# Patient Record
Sex: Female | Born: 1950 | Race: Black or African American | Hispanic: No | Smoking: Former smoker
Health system: Southern US, Community
[De-identification: ages and names within clinical notes are randomized; demographics above are authoritative.]

## PROBLEM LIST (undated history)

## (undated) DIAGNOSIS — D649 Anemia, unspecified: Secondary | ICD-10-CM

## (undated) DIAGNOSIS — F319 Bipolar disorder, unspecified: Secondary | ICD-10-CM

## (undated) DIAGNOSIS — G2581 Restless legs syndrome: Secondary | ICD-10-CM

## (undated) DIAGNOSIS — G610 Guillain-Barre syndrome: Secondary | ICD-10-CM

## (undated) DIAGNOSIS — E079 Disorder of thyroid, unspecified: Secondary | ICD-10-CM

## (undated) DIAGNOSIS — C801 Malignant (primary) neoplasm, unspecified: Secondary | ICD-10-CM

## (undated) DIAGNOSIS — M1990 Unspecified osteoarthritis, unspecified site: Secondary | ICD-10-CM

## (undated) DIAGNOSIS — Z8489 Family history of other specified conditions: Secondary | ICD-10-CM

## (undated) DIAGNOSIS — R0902 Hypoxemia: Secondary | ICD-10-CM

## (undated) DIAGNOSIS — J189 Pneumonia, unspecified organism: Secondary | ICD-10-CM

## (undated) DIAGNOSIS — J449 Chronic obstructive pulmonary disease, unspecified: Secondary | ICD-10-CM

## (undated) DIAGNOSIS — R569 Unspecified convulsions: Secondary | ICD-10-CM

## (undated) DIAGNOSIS — N63 Unspecified lump in unspecified breast: Secondary | ICD-10-CM

## (undated) DIAGNOSIS — F419 Anxiety disorder, unspecified: Secondary | ICD-10-CM

## (undated) DIAGNOSIS — K219 Gastro-esophageal reflux disease without esophagitis: Secondary | ICD-10-CM

## (undated) DIAGNOSIS — K589 Irritable bowel syndrome without diarrhea: Secondary | ICD-10-CM

## (undated) DIAGNOSIS — R51 Headache: Secondary | ICD-10-CM

## (undated) DIAGNOSIS — R519 Headache, unspecified: Secondary | ICD-10-CM

## (undated) HISTORY — PX: EYE SURGERY: SHX253

## (undated) HISTORY — PX: BREAST SURGERY: SHX581

## (undated) HISTORY — DX: Disorder of thyroid, unspecified: E07.9

## (undated) HISTORY — PX: COLONOSCOPY: SHX174

## (undated) HISTORY — PX: MANDIBLE RECONSTRUCTION: SHX431

## (undated) HISTORY — PX: TONSILLECTOMY: SUR1361

## (undated) HISTORY — DX: Hypoxemia: R09.02

---

## 2013-08-04 ENCOUNTER — Emergency Department (HOSPITAL_BASED_OUTPATIENT_CLINIC_OR_DEPARTMENT_OTHER)
Admission: EM | Admit: 2013-08-04 | Discharge: 2013-08-04 | Disposition: A | Discharge status: Home / Self Care | Payer: Medicaid Other | Attending: Emergency Medicine | Admitting: Emergency Medicine

## 2013-08-04 DIAGNOSIS — Z9889 Other specified postprocedural states: Secondary | ICD-10-CM | POA: Insufficient documentation

## 2013-08-04 DIAGNOSIS — K1379 Other lesions of oral mucosa: Secondary | ICD-10-CM

## 2013-08-04 DIAGNOSIS — Y849 Medical procedure, unspecified as the cause of abnormal reaction of the patient, or of later complication, without mention of misadventure at the time of the procedure: Secondary | ICD-10-CM | POA: Insufficient documentation

## 2013-08-04 DIAGNOSIS — T85698A Other mechanical complication of other specified internal prosthetic devices, implants and grafts, initial encounter: Secondary | ICD-10-CM | POA: Insufficient documentation

## 2013-08-04 DIAGNOSIS — Z79899 Other long term (current) drug therapy: Secondary | ICD-10-CM | POA: Insufficient documentation

## 2013-08-04 DIAGNOSIS — IMO0002 Reserved for concepts with insufficient information to code with codable children: Secondary | ICD-10-CM | POA: Insufficient documentation

## 2013-08-04 MED ORDER — KETOROLAC TROMETHAMINE 30 MG/ML IJ SOLN
60.0000 mg | Freq: Once | INTRAMUSCULAR | Status: AC
  Administered 2013-08-04: 60 mg via INTRAMUSCULAR
  Filled 2013-08-04: qty 2

## 2013-08-04 NOTE — ED Notes (Signed)
Pt has visible wire coming through gum in R lower mouth.
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
## 2013-08-04 NOTE — Discharge Instructions (Signed)
 Dental Care and Dentist Visits Dental care supports good overall health. Regular dental visits can also help you avoid dental pain, bleeding, infection, and other more serious health problems in the future. It is important to keep the mouth healthy because diseases in the teeth, gums, and other oral tissues can spread to other areas of the body. Some problems, such as diabetes, heart disease, and pre-term labor have been associated with poor oral health.  See your dentist every 6 months. If you experience emergency problems such as a toothache or broken tooth, go to the dentist right away. If you see your dentist regularly, you may catch problems early. It is easier to be treated for problems in the early stages.  WHAT TO EXPECT AT A DENTIST VISIT  Your dentist will look for many common oral health problems and recommend proper treatment. At your regular dental visit, you can expect:  Gentle cleaning of the teeth and gums. This includes scraping and polishing. This helps to remove the sticky substance around the teeth and gums (plaque). Plaque forms in the mouth shortly after eating. Over time, plaque hardens on the teeth as tartar. If tartar is not removed regularly, it can cause problems. Cleaning also helps remove stains.  Periodic X-rays. These pictures of the teeth and supporting bone will help your dentist assess the health of your teeth.  Periodic fluoride treatments. Fluoride is a natural mineral shown to help strengthen teeth. Fluoride treatmentinvolves applying a fluoride gel or varnish to the teeth. It is most commonly done in children.  Examination of the mouth, tongue, jaws, teeth, and gums to look for any oral health problems, such as:  Cavities (dental caries). This is decay on the tooth caused by plaque, sugar, and acid in the mouth. It is best to catch a cavity when it is small.  Inflammation of the gums caused by plaque buildup (gingivitis).  Problems with the mouth or malformed  or misaligned teeth.  Oral cancer or other diseases of the soft tissues or jaws. KEEP YOUR TEETH AND GUMS HEALTHY For healthy teeth and gums, follow these general guidelines as well as your dentist's specific advice:  Have your teeth professionally cleaned at the dentist every 6 months.  Brush twice daily with a fluoride toothpaste.  Floss your teeth daily.  Ask your dentist if you need fluoride supplements, treatments, or fluoride toothpaste.  Eat a healthy diet. Reduce foods and drinks with added sugar.  Avoid smoking. TREATMENT FOR ORAL HEALTH PROBLEMS If you have oral health problems, treatment varies depending on the conditions present in your teeth and gums.  Your caregiver will most likely recommend good oral hygiene at each visit.  For cavities, gingivitis, or other oral health disease, your caregiver will perform a procedure to treat the problem. This is typically done at a separate appointment. Sometimes your caregiver will refer you to another dental specialist for specific tooth problems or for surgery. SEEK IMMEDIATE DENTAL CARE IF:  You have pain, bleeding, or soreness in the gum, tooth, jaw, or mouth area.  A permanent tooth becomes loose or separated from the gum socket.  You experience a blow or injury to the mouth or jaw area. Document Released: 08/02/2011 Document Revised: 02/12/2012 Document Reviewed: 08/02/2011 [facility] Patient Information 2014 [place], [place].   RESOURCE GUIDE  Chronic Pain Problems: Contact [facility]  [phone] Patients need to be referred by their primary care doctor.  Insufficient Money for Medicine: Contact United Way:  call [phone]  No Primary Care Doctor: - Call Health Connect  [phone] - can help you locate a primary care doctor that  accepts your insurance, provides certain services, etc. - Physician Referral Service- [phone]  Agencies that provide inexpensive medical care: - [facility]  [phone] - [facility]  [phone] - [facility]  [phone] - Women's Clinic  [phone] - Planned Parenthood  [phone] [facility]  [phone]  Medicaid-accepting [facility] Providers: - [facility]- [address], [address]  [phone], Mon-Fri 9am-7pm, Sat 9am-1pm - [facility]- [address] [place], Suite [place]  [phone] - [facility]- [address], Suite [place]  [phone] [facility] Family Medicine- [address]  [phone] - Jonathon Neighbors- [address] [place], [address], [phone]  Only accepts [place] patients after they have their name  applied to their card  Self Pay (no insurance) in [facility]: - Sickle Cell Patients - [facility]  [address] [place], [phone] - [facility]- [address] [place]  [phone]       [facility] [place]- [address]       -     [facility]- see information above (Speak to [employer] if you do not have insurance)       -  [facility]- [address] [place],  [phone]       -  [facility]- [address], [phone]       -  Dr Sherlene Diss-  [address], [place], [phone]       -  [facility] - [address], [phone]       -  [facility]- [address], [phone], also [address] [phone]       -     [facility]- [address] [place], [phone], 1st & 3rd Saturday         every month, 10am-1pm  -     [facility] and [facility]   [address] [place], [place].   Phone:  [phone], Fax:  [phone]. Hours of Operation:  9 am - 6 pm, M-F.  -     [facility]   [address], [place]   Phone: [phone], Fax: [phone]. Hours of Operation:  8:30 am - 5:30 pm, M-F.  [facility] [address] [place], [place] [address] [phone]  [facility] [address] [place], [place] [address] [phone]  1) Find a Doctor and Pay Out of Pocket Although you won't have to find out who is covered by your insurance plan, it is a good idea to ask around and get recommendations. You will then need to call the office and see if the doctor you have chosen will accept you as a new patient and what types of options they offer for patients who are self-pay. Some doctors offer discounts or will set up payment plans for their patients who do not have insurance, but you will need to ask so you aren't surprised when you get to your appointment.  2) Contact Your Local Health Department Not all health departments have doctors that can see patients for sick visits, but  many do, so it is worth a call to see if yours does. If you don't know where your local health department is, you can check in your phone book. The CDC also has a tool to help you locate your state's health department, and many state websites also have listings of all of their local health departments.  3) Find a Walk-in Clinic If your illness is not likely to be very severe or complicated, you may want to try a walk in clinic. These are popping up all over the country in pharmacies, drugstores, and shopping centers. They're usually staffed by nurse practitioners or physician assistants that have been trained to treat common illnesses and complaints. They're usually fairly quick and inexpensive. However, if you have serious medical issues or chronic medical problems, these are probably not your best option  STD Testing - [facility] Department of [facility], STD Clinic, [address], [place], phone [phone] or [phone].  Monday - Friday, call for an appointment. [facility] of [employer], STD Clinic, [address], [place], phone  [phone] or [phone].  Monday - Friday, call for an appointment.  Abuse/Neglect: [facility] Child Abuse Hotline 469-880-6461 [facility] Child Abuse Hotline 915-473-7357 (After Hours)  Emergency Shelter:  [facility] ([phone]  Maternity Homes: - Room at the [place] [facility] 3143840653 - [facility] [phone]  MRSA Hotline #:   [phone]  Dental Assistance If unable to pay or uninsured, contact:  [facility]. to become qualified for the adult dental clinic.  Patients with Medicaid: [facility] [phone] [address], [phone] [address], [phone]  If unable to pay, or uninsured, contact [facility] [phone] in [place], [phone] in [facility]) to become qualified for the adult dental clinic  [facility] [address] [place], [place] [address] [phone] [email or web address]  Other Proofreader Services: - [facility]- [address] [place], [place], [place], [address], [phone], Ext. [phone], 2nd and 4th Thursday of the month at 6:30am.  10 clients each day by appointment, can sometimes see walk-in patients if someone does not show for an appointment. [facility]- [address] [place], [place], [address], [phone] - [facility] [address] [place], [place], [address], [phone] - [facility]- [phone] [facility]- [phone] [facility] [phone]       Behavioral Health Resources in the [facility]  Intensive Outpatient Programs: [facility]      [address] [place], [place] [phone] Both a day and evening program       [facility]     [address]        [place], [place] [address] [phone]         ADS: [facility] [address] [place] [phone]  [facility] ACCESS LINE: (404)795-6890 or [phone] [place], [place] [address] [email or web address]   Substance Abuse Resources: - Alcohol and Drug Services  7083165285 - [facility] [phone] - The [place] [phone] Kenny Peals [phone] - Residential & Outpatient Substance Abuse Program  [phone]  Psychological Services: [facility]  [phone] [facility] Services  [phone] - [facility], [phone] [place]. [address], [place], ACCESS LINE: [phone]  or (404) [phone], [email or web address]  Mobile Crisis Teams:                                        Therapeutic Alternatives         Mobile Crisis Care Unit [phone]             [facility] [address] Jonette Nestle [phone]                                         Interventionist [address] [place] [phone]  Self-Help/Support Groups: Mental Health Assoc. of [employer] of support groups (236)[phone] (call for more info)  Narcotics Anonymous (NA) Caring Services [address] [place] - 2 meetings at this location  Residential Treatment Programs:  [facility]      [address]        [place]       [phone]         [facility] [address] [place] [address] [place], [place]  [address] [phone]  [facility]  [address] [place], [place] [address] [phone] Admissions: 8am-3pm M-F  [facility]     [address]        [place], [place] [address]       [phone]         [facility] [address] Elda Greener [place], [place] [phone]  [facility] [address] [place], [place] [phone]  [facility] -  Intensive Outpatient      [address]     [place], [place]       [phone]         [facility] (Addiction [facility]     [address] [place], [place] [phone] or [phone]  Residential Treatment Services (RTS), Medicaid [address] [place], [place] [phone]  [facility] [address]                                               [address] [place] [phone]  [facility] Resources: [phone]               General Therapy                                                Trixie Furnace, PhD        [address] [place], [place] [address]         [phone]   Insurance  [facility] Behavioral   [address] [place], [place] [address] [phone]  [facility] Recovery [address] [place], [place] [address] [phone] Insurance/Medicaid/sponsorship through [facility]  Faith and Families  [address]                                        [place] [employer], [place] [address]    Therapy/tele-psych/case         [phone]          [facility] [address], [place]  [address]  Adolescent/group home/case management [phone]                                           Artist Binet PhD       General therapy       Insurance   [phone]         Dr. Carlos [place], [place], M-F [phone]  [facility] [place]  [employer] [facility] Dept. [address]                 [address]         [address] [place] [address]  Clara Crisp Phone:  [phone]                                  Phone:  [phone]                   Phone:  [phone]  [facility] Mental Health, [phone] - [facility] - [facility]- [phone]       -     [facility] in [place], [address],             [phone], Insurance  [place] Child Abuse Hotline (712)549-5606 or [phone] (After Hours)
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
## 2014-06-08 ENCOUNTER — Emergency Department (HOSPITAL_BASED_OUTPATIENT_CLINIC_OR_DEPARTMENT_OTHER)
Admission: EM | Admit: 2014-06-08 | Discharge: 2014-06-08 | Discharge status: Against Medical Advice | Payer: Medicaid Other | Attending: Emergency Medicine | Admitting: Emergency Medicine

## 2014-06-08 ENCOUNTER — Encounter (HOSPITAL_BASED_OUTPATIENT_CLINIC_OR_DEPARTMENT_OTHER): Payer: Self-pay | Admitting: Emergency Medicine

## 2014-06-08 DIAGNOSIS — X088XXA Exposure to other specified smoke, fire and flames, initial encounter: Secondary | ICD-10-CM | POA: Diagnosis not present

## 2014-06-08 DIAGNOSIS — T2200XA Burn of unspecified degree of shoulder and upper limb, except wrist and hand, unspecified site, initial encounter: Secondary | ICD-10-CM | POA: Insufficient documentation

## 2014-06-08 DIAGNOSIS — Y929 Unspecified place or not applicable: Secondary | ICD-10-CM | POA: Insufficient documentation

## 2014-06-08 DIAGNOSIS — Y939 Activity, unspecified: Secondary | ICD-10-CM | POA: Insufficient documentation

## 2014-06-08 HISTORY — DX: Malignant (primary) neoplasm, unspecified: C80.1

## 2014-06-08 HISTORY — DX: Unspecified lump in unspecified breast: N63.0
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
## 2014-06-08 NOTE — ED Notes (Signed)
Called to room no answer.  Per [place], rn pt left b/c did not want to wait.
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
## 2015-10-14 ENCOUNTER — Encounter (HOSPITAL_COMMUNITY): Payer: Self-pay | Admitting: *Deleted

## 2015-10-14 MED ORDER — CEFAZOLIN SODIUM-DEXTROSE 2-3 GM-% IV SOLR
2.0000 g | INTRAVENOUS | Status: AC
  Administered 2015-10-15: 2 g via INTRAVENOUS
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
## 2015-10-14 NOTE — Anesthesia Preprocedure Evaluation (Addendum)
Anesthesia Evaluation  Patient identified by MRN, date of birth, ID band Patient awake    Reviewed: Allergy & Precautions, NPO status , Patient's Chart, lab work & pertinent test results  History of Anesthesia Complications Negative for: history of anesthetic complications  Airway Mallampati: I  TM Distance: >3 FB Neck ROM: Full    Dental  (+) Edentulous Upper, Dental Advisory Given   Pulmonary COPD, former smoker (quit '08),    breath sounds clear to auscultation       Cardiovascular (-) anginanegative cardio ROS   Rhythm:Regular Rate:Normal     Neuro/Psych Seizures -,  Anxiety Depression Bipolar Disorder H/o Guillian Barre    GI/Hepatic Neg liver ROS, GERD  Controlled,  Endo/Other  negative endocrine ROS  Renal/GU negative Renal ROS     Musculoskeletal  (+) Arthritis , Osteoarthritis,    Abdominal   Peds  Hematology negative hematology ROS (+)   Anesthesia Other Findings   Reproductive/Obstetrics                          Anesthesia Physical Anesthesia Plan  ASA: III  Anesthesia Plan: General   Post-op Pain Management:    Induction: Intravenous  Airway Management Planned: Nasal ETT  Additional Equipment:   Intra-op Plan:   Post-operative Plan: Extubation in OR  Informed Consent: I have reviewed the patients History and Physical, chart, labs and discussed the procedure including the risks, benefits and alternatives for the proposed anesthesia with the patient or authorized representative who has indicated his/her understanding and acceptance.   Dental advisory given  Plan Discussed with: Surgeon and CRNA  Anesthesia Plan Comments: (Plan routine monitors, GETA)        Anesthesia Quick Evaluation

## 2015-10-15 ENCOUNTER — Encounter (HOSPITAL_COMMUNITY): Payer: Self-pay | Admitting: *Deleted

## 2015-10-15 ENCOUNTER — Ambulatory Visit (HOSPITAL_COMMUNITY): Payer: Medicaid Other | Admitting: Anesthesiology

## 2015-10-15 ENCOUNTER — Encounter (HOSPITAL_COMMUNITY)
Admission: RE | Disposition: A | Discharge status: Home / Self Care | Payer: Self-pay | Source: Ambulatory Visit | Attending: Oral Surgery

## 2015-10-15 ENCOUNTER — Ambulatory Visit (HOSPITAL_COMMUNITY)
Admission: RE | Admit: 2015-10-15 | Discharge: 2015-10-15 | Disposition: A | Discharge status: Home / Self Care | Payer: Medicaid Other | Source: Ambulatory Visit | Attending: Oral Surgery | Admitting: Oral Surgery

## 2015-10-15 DIAGNOSIS — F319 Bipolar disorder, unspecified: Secondary | ICD-10-CM | POA: Insufficient documentation

## 2015-10-15 DIAGNOSIS — Y831 Surgical operation with implant of artificial internal device as the cause of abnormal reaction of the patient, or of later complication, without mention of misadventure at the time of the procedure: Secondary | ICD-10-CM | POA: Insufficient documentation

## 2015-10-15 DIAGNOSIS — R569 Unspecified convulsions: Secondary | ICD-10-CM | POA: Insufficient documentation

## 2015-10-15 DIAGNOSIS — J449 Chronic obstructive pulmonary disease, unspecified: Secondary | ICD-10-CM | POA: Diagnosis not present

## 2015-10-15 DIAGNOSIS — F418 Other specified anxiety disorders: Secondary | ICD-10-CM | POA: Diagnosis not present

## 2015-10-15 DIAGNOSIS — Z7983 Long term (current) use of bisphosphonates: Secondary | ICD-10-CM | POA: Diagnosis not present

## 2015-10-15 DIAGNOSIS — K219 Gastro-esophageal reflux disease without esophagitis: Secondary | ICD-10-CM | POA: Diagnosis not present

## 2015-10-15 DIAGNOSIS — M1990 Unspecified osteoarthritis, unspecified site: Secondary | ICD-10-CM | POA: Insufficient documentation

## 2015-10-15 DIAGNOSIS — T8484XA Pain due to internal orthopedic prosthetic devices, implants and grafts, initial encounter: Secondary | ICD-10-CM | POA: Insufficient documentation

## 2015-10-15 DIAGNOSIS — Z87891 Personal history of nicotine dependence: Secondary | ICD-10-CM | POA: Insufficient documentation

## 2015-10-15 DIAGNOSIS — Z79899 Other long term (current) drug therapy: Secondary | ICD-10-CM | POA: Insufficient documentation

## 2015-10-15 DIAGNOSIS — Z7951 Long term (current) use of inhaled steroids: Secondary | ICD-10-CM | POA: Insufficient documentation

## 2015-10-15 HISTORY — DX: Anxiety disorder, unspecified: F41.9

## 2015-10-15 HISTORY — DX: Restless legs syndrome: G25.81

## 2015-10-15 HISTORY — DX: Gastro-esophageal reflux disease without esophagitis: K21.9

## 2015-10-15 HISTORY — DX: Bipolar disorder, unspecified: F31.9

## 2015-10-15 HISTORY — DX: Headache, unspecified: R51.9

## 2015-10-15 HISTORY — DX: Pneumonia, unspecified organism: J18.9

## 2015-10-15 HISTORY — DX: Guillain-Barre syndrome: G61.0

## 2015-10-15 HISTORY — DX: Chronic obstructive pulmonary disease, unspecified: J44.9

## 2015-10-15 HISTORY — DX: Unspecified convulsions: R56.9

## 2015-10-15 HISTORY — DX: Headache: R51

## 2015-10-15 HISTORY — DX: Family history of other specified conditions: Z84.89

## 2015-10-15 HISTORY — PX: MINOR REMOVAL OF MANDIBULAR HARDWARE: SHX6427

## 2015-10-15 HISTORY — DX: Unspecified osteoarthritis, unspecified site: M19.90

## 2015-10-15 HISTORY — DX: Anemia, unspecified: D64.9

## 2015-10-15 HISTORY — DX: Irritable bowel syndrome, unspecified: K58.9

## 2015-10-15 LAB — CBC
HCT: 40.5 % (ref 36.0–46.0)
Hemoglobin: 13.5 g/dL (ref 12.0–15.0)
MCH: 31.6 pg (ref 26.0–34.0)
MCHC: 33.3 g/dL (ref 30.0–36.0)
MCV: 94.8 fL (ref 78.0–100.0)
Platelets: 278 10*3/uL (ref 150–400)
RBC: 4.27 MIL/uL (ref 3.87–5.11)
RDW: 14.6 % (ref 11.5–15.5)
WBC: 7 10*3/uL (ref 4.0–10.5)

## 2015-10-15 SURGERY — MINOR REMOVAL OF MANDIBULAR HARDWARE
Anesthesia: General | Site: Mouth | Laterality: Right

## 2015-10-15 MED ORDER — ROCURONIUM BROMIDE 50 MG/5ML IV SOLN
INTRAVENOUS | Status: AC
  Filled 2015-10-15: qty 1

## 2015-10-15 MED ORDER — FENTANYL CITRATE (PF) 100 MCG/2ML IJ SOLN
INTRAMUSCULAR | Status: DC | PRN
  Administered 2015-10-15: 50 ug via INTRAVENOUS

## 2015-10-15 MED ORDER — PROPOFOL 10 MG/ML IV BOLUS
INTRAVENOUS | Status: AC
  Filled 2015-10-15: qty 20

## 2015-10-15 MED ORDER — LIDOCAINE-EPINEPHRINE 2 %-1:100000 IJ SOLN
INTRAMUSCULAR | Status: AC
  Filled 2015-10-15: qty 1

## 2015-10-15 MED ORDER — MIDAZOLAM HCL 2 MG/2ML IJ SOLN: 0.5000 mg | Freq: Once | INTRAMUSCULAR | Status: DC | PRN

## 2015-10-15 MED ORDER — LIDOCAINE HCL (CARDIAC) 20 MG/ML IV SOLN
INTRAVENOUS | Status: DC | PRN
  Administered 2015-10-15: 20 mg via INTRAVENOUS

## 2015-10-15 MED ORDER — ONDANSETRON HCL 4 MG/2ML IJ SOLN
INTRAMUSCULAR | Status: DC | PRN
  Administered 2015-10-15: 4 mg via INTRAVENOUS

## 2015-10-15 MED ORDER — ONDANSETRON HCL 4 MG/2ML IJ SOLN
INTRAMUSCULAR | Status: AC
  Filled 2015-10-15: qty 2

## 2015-10-15 MED ORDER — EPHEDRINE SULFATE 50 MG/ML IJ SOLN
INTRAMUSCULAR | Status: AC
  Filled 2015-10-15: qty 1

## 2015-10-15 MED ORDER — DEXAMETHASONE SODIUM PHOSPHATE 4 MG/ML IJ SOLN
INTRAMUSCULAR | Status: DC | PRN
  Administered 2015-10-15: 4 mg via INTRAVENOUS

## 2015-10-15 MED ORDER — 0.9 % SODIUM CHLORIDE (POUR BTL) OPTIME
TOPICAL | Status: DC | PRN
  Administered 2015-10-15: 1000 mL

## 2015-10-15 MED ORDER — SUCCINYLCHOLINE CHLORIDE 20 MG/ML IJ SOLN
INTRAMUSCULAR | Status: AC
  Filled 2015-10-15: qty 1

## 2015-10-15 MED ORDER — PROMETHAZINE HCL 25 MG/ML IJ SOLN: 6.2500 mg | INTRAMUSCULAR | Status: DC | PRN

## 2015-10-15 MED ORDER — FENTANYL CITRATE (PF) 100 MCG/2ML IJ SOLN: 25.0000 ug | INTRAMUSCULAR | Status: DC | PRN

## 2015-10-15 MED ORDER — LIDOCAINE HCL (CARDIAC) 20 MG/ML IV SOLN
INTRAVENOUS | Status: AC
  Filled 2015-10-15: qty 5

## 2015-10-15 MED ORDER — PROPOFOL 10 MG/ML IV BOLUS
INTRAVENOUS | Status: DC | PRN
  Administered 2015-10-15: 150 mg via INTRAVENOUS
  Administered 2015-10-15: 50 mg via INTRAVENOUS

## 2015-10-15 MED ORDER — SUCCINYLCHOLINE CHLORIDE 20 MG/ML IJ SOLN
INTRAMUSCULAR | Status: DC | PRN
  Administered 2015-10-15: 120 mg via INTRAVENOUS

## 2015-10-15 MED ORDER — LIDOCAINE-EPINEPHRINE 2 %-1:100000 IJ SOLN
INTRAMUSCULAR | Status: DC | PRN
  Administered 2015-10-15: 10 mL via INTRADERMAL

## 2015-10-15 MED ORDER — LACTATED RINGERS IV SOLN
INTRAVENOUS | Status: DC | PRN
  Administered 2015-10-15: 07:00:00 via INTRAVENOUS

## 2015-10-15 MED ORDER — MIDAZOLAM HCL 5 MG/5ML IJ SOLN
INTRAMUSCULAR | Status: DC | PRN
  Administered 2015-10-15: 2 mg via INTRAVENOUS

## 2015-10-15 MED ORDER — SODIUM CHLORIDE 0.9 % IJ SOLN
INTRAMUSCULAR | Status: AC
  Filled 2015-10-15: qty 10

## 2015-10-15 MED ORDER — OXYMETAZOLINE HCL 0.05 % NA SOLN
NASAL | Status: DC | PRN
  Administered 2015-10-15: 2 via NASAL

## 2015-10-15 MED ORDER — SODIUM CHLORIDE 0.9 % IR SOLN
Status: DC | PRN
  Administered 2015-10-15: 1000 mL

## 2015-10-15 MED ORDER — FENTANYL CITRATE (PF) 250 MCG/5ML IJ SOLN
INTRAMUSCULAR | Status: AC
  Filled 2015-10-15: qty 5

## 2015-10-15 MED ORDER — MIDAZOLAM HCL 2 MG/2ML IJ SOLN
INTRAMUSCULAR | Status: AC
  Filled 2015-10-15: qty 4

## 2015-10-15 MED ORDER — MEPERIDINE HCL 25 MG/ML IJ SOLN: 6.2500 mg | INTRAMUSCULAR | Status: DC | PRN

## 2015-10-15 MED ORDER — OXYCODONE-ACETAMINOPHEN 5-325 MG PO TABS
1.0000 | ORAL_TABLET | ORAL | Status: DC | PRN
Start: 2015-10-15 — End: 2022-03-08

## 2015-10-15 MED ORDER — DEXAMETHASONE SODIUM PHOSPHATE 4 MG/ML IJ SOLN
INTRAMUSCULAR | Status: AC
  Filled 2015-10-15: qty 1

## 2015-10-15 SURGICAL SUPPLY — 26 items
BUR CROSS CUT FISSURE 1.6 (BURR) ×3 IMPLANT
BUR EGG ELITE 4.0 (BURR) IMPLANT
CANISTER SUCTION 2500CC (MISCELLANEOUS) ×3 IMPLANT
COVER SURGICAL LIGHT HANDLE (MISCELLANEOUS) ×3 IMPLANT
CRADLE DONUT ADULT HEAD (MISCELLANEOUS) ×3 IMPLANT
FLUID NSS /IRRIG 1000 ML XXX (MISCELLANEOUS) ×3 IMPLANT
GAUZE PACKING FOLDED 2  STR (GAUZE/BANDAGES/DRESSINGS) ×1
GAUZE PACKING FOLDED 2 STR (GAUZE/BANDAGES/DRESSINGS) ×2 IMPLANT
GLOVE BIO SURGEON STRL SZ 6.5 (GLOVE) ×3 IMPLANT
GLOVE BIO SURGEON STRL SZ7.5 (GLOVE) ×3 IMPLANT
GLOVE BIOGEL PI IND STRL 7.0 (GLOVE) ×2 IMPLANT
GLOVE BIOGEL PI INDICATOR 7.0 (GLOVE) ×1
GOWN STRL REUS W/ TWL LRG LVL3 (GOWN DISPOSABLE) ×2 IMPLANT
GOWN STRL REUS W/ TWL XL LVL3 (GOWN DISPOSABLE) ×2 IMPLANT
GOWN STRL REUS W/TWL LRG LVL3 (GOWN DISPOSABLE) ×1
GOWN STRL REUS W/TWL XL LVL3 (GOWN DISPOSABLE) ×1
KIT BASIN OR (CUSTOM PROCEDURE TRAY) ×3 IMPLANT
KIT ROOM TURNOVER OR (KITS) ×3 IMPLANT
NEEDLE 22X1 1/2 (OR ONLY) (NEEDLE) ×6 IMPLANT
NS IRRIG 1000ML POUR BTL (IV SOLUTION) ×3 IMPLANT
PAD ARMBOARD 7.5X6 YLW CONV (MISCELLANEOUS) ×3 IMPLANT
SUT CHROMIC 3 0 PS 2 (SUTURE) ×6 IMPLANT
SYR CONTROL 10ML LL (SYRINGE) ×3 IMPLANT
TRAY ENT MC OR (CUSTOM PROCEDURE TRAY) ×3 IMPLANT
TUBING IRRIGATION (MISCELLANEOUS) ×3 IMPLANT
YANKAUER SUCT BULB TIP NO VENT (SUCTIONS) ×3 IMPLANT

## 2015-10-15 NOTE — Anesthesia Procedure Notes (Signed)
Procedure Name: Intubation Date/Time: 10/15/2015 7:40 AM Performed by: Susa Loffler Pre-anesthesia Checklist: Patient identified, Timeout performed, Emergency Drugs available, Suction available and Patient being monitored Patient Re-evaluated:Patient Re-evaluated prior to inductionOxygen Delivery Method: Circle system utilized Preoxygenation: Pre-oxygenation with 100% oxygen Intubation Type: IV induction Laryngoscope Size: Mac and 4 Grade View: Grade I Nasal Tubes: Right, Nasal prep performed and Magill forceps- large, utilized Tube size: 7.0 mm Number of attempts: 1 Placement Confirmation: positive ETCO2,  ETT inserted through vocal cords under direct vision and breath sounds checked- equal and bilateral Secured at: 28 cm Tube secured with: Tape Dental Injury: Teeth and Oropharynx as per pre-operative assessment  Comments: Red rubber catheter used to guide nasal ett through nasopharynx; atraumatic. KHO

## 2015-10-15 NOTE — H&P (Signed)
Anesthesia H&P Update: History and Physical Exam reviewed; patient is OK for planned anesthetic and procedure. ? ?

## 2015-10-15 NOTE — H&P (Signed)
H&P documentation  -History and Physical Reviewed  -Patient has been re-examined  -No change in the plan of care  Dominique Calvey M  

## 2015-10-15 NOTE — Op Note (Signed)
10/15/2015  8:01 AM  PATIENT:  Jill Spence  64 y.o. female  PRE-OPERATIVE DIAGNOSIS:  Screw Displacement Right Mandibular Fracture  POST-OPERATIVE DIAGNOSIS:  SAME  PROCEDURE:  Procedure(s): MINOR REMOVAL OF Right MANDIBULAR HARDWARE  SURGEON:  Surgeon(s): Diona Browner, DDS  ANESTHESIA:   local and general  EBL:  minimal  DRAINS: none   SPECIMEN:  No Specimen  COUNTS:  YES  PLAN OF CARE: Discharge to home after PACU  PATIENT DISPOSITION:  PACU - hemodynamically stable.   PROCEDURE DETAILS: Dictation # [id number]  Gae Bon, DMD 10/15/2015 8:01 AM

## 2015-10-15 NOTE — Anesthesia Postprocedure Evaluation (Signed)
  Anesthesia Post-op Note  Patient: Jill Spence  Procedure(s) Performed: Procedure(s): MINOR REMOVAL OF Right MANDIBULAR HARDWARE (Right)  Patient Location: PACU  Anesthesia Type:General  Level of Consciousness: awake, alert  and patient cooperative  Airway and Oxygen Therapy: Patient Spontanous Breathing  Post-op Pain: none  Post-op Assessment: Post-op Vital signs reviewed, Patient's Cardiovascular Status Stable, Respiratory Function Stable, Patent Airway, No signs of Nausea or vomiting and Pain level controlled              Post-op Vital Signs: Reviewed and stable  Last Vitals:  Filed Vitals:   10/15/15 0837  BP: 132/94  Pulse: 68  Temp: 36.1 C  Resp: 15    Complications: No apparent anesthesia complications

## 2015-10-15 NOTE — Transfer of Care (Signed)
Immediate Anesthesia Transfer of Care Note  Patient: Jill Spence  Procedure(s) Performed: Procedure(s): MINOR REMOVAL OF Right MANDIBULAR HARDWARE (Right)  Patient Location: PACU  Anesthesia Type:General  Level of Consciousness: awake, alert  and oriented  Airway & Oxygen Therapy: Patient Spontanous Breathing and Patient connected to nasal cannula oxygen  Post-op Assessment: Report given to RN and Post -op Vital signs reviewed and stable  Post vital signs: Reviewed and stable  Last Vitals:  Filed Vitals:   10/15/15 0710  BP:   Pulse:   Temp: 36.9 C  Resp:     Complications: No apparent anesthesia complications

## 2015-10-15 NOTE — Op Note (Signed)
NAMEAUBRIEE, Jill Spence                ACCOUNT NO.:  [id number]  MEDICAL RECORD NO.:  [id number]  LOCATION:  [facility]                         FACILITY:  [facility]  PHYSICIAN:  Gae Bon, M.D.  DATE OF BIRTH:  [birth year]  DATE OF PROCEDURE:  10/15/2015 DATE OF DISCHARGE:  10/15/2015                              OPERATIVE REPORT   PREOPERATIVE DIAGNOSIS:  Screw displacement in right mandibular fracture.  POSTOPERATIVE DIAGNOSIS:  Screw displacement in right mandibular fracture.  PROCEDURE:  Minor removal of right mandibular hardware screw.  SURGEON:  Gae Bon, M.D.  ANESTHESIA:  General, nasal intubation.  PROCEDURE:  The patient was taken to the operating room, placed on the table in supine position.  General anesthesia was administered and a nasal endotracheal tube was placed and secured.  The eyes were protected, and the patient was draped for the procedure.  Time-out was performed.  The posterior pharynx was suctioned.  A throat pack was placed.  A 2% lidocaine with 1:100,000 epinephrine was infiltrated in a right inferior alveolar block and in buccal and lingual infiltration of the right mandible, a total of 10 mL was utilized.  A #15 blade was used to make a crystal incision approximately 3 cm long along the crest to mandibular edentulous ridge ending with the right mandibular bicuspid. The periosteum was reflected lingually, and there was 1 screw noted to be protruding through the inferior lingual border of the mandible.  This was removed and smoothed using the Stryker handpiece with a fissure bur. There was another screwed noted, but it was previously trimmed and flushed with the mandible.  The area was then irrigated and closed with 3-0 chromic.  The throat pack was removed, and the patient was awakened and taken to the recovery room, breathing spontaneously in good condition.  ESTIMATED BLOOD LOSS:  Minimal.  COMPLICATIONS:  None.  SPECIMENS:   None.     Gae Bon, M.D.     SMJ/MEDQ  D:  10/15/2015  T:  10/15/2015  Job:  [id number]

## 2015-10-18 ENCOUNTER — Encounter (HOSPITAL_COMMUNITY): Payer: Self-pay | Admitting: Oral Surgery

## 2018-01-31 ENCOUNTER — Telehealth: Payer: Self-pay

## 2018-01-31 NOTE — Telephone Encounter (Signed)
Will anyone accept her as new Pt?
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
## 2018-01-31 NOTE — Telephone Encounter (Signed)
I decline to accept. Maybe MD other practice would accept?

## 2018-01-31 NOTE — Telephone Encounter (Signed)
I don't think I would be the best fit for her. TY.

## 2018-01-31 NOTE — Telephone Encounter (Signed)
Not taking new patients at this time.

## 2018-01-31 NOTE — Telephone Encounter (Signed)
I agree with Dr. Lorelei Pont.

## 2018-01-31 NOTE — Telephone Encounter (Signed)
I don't feel like any of [place] can say "yes" to prescribing her oxycodone prior to knowing more about her situation.  It sounds like she might need to seek a different practice

## 2018-02-01 NOTE — Telephone Encounter (Signed)
Telephone note forwarded to [place], Engineer, building services to discuss w/ Pt.

## 2018-02-01 NOTE — Telephone Encounter (Signed)
Unable to take pt

## 2018-02-05 NOTE — Telephone Encounter (Signed)
I am not taking neew pts

## 2019-06-13 ENCOUNTER — Other Ambulatory Visit: Payer: Self-pay | Admitting: Family

## 2019-06-13 DIAGNOSIS — Z1231 Encounter for screening mammogram for malignant neoplasm of breast: Secondary | ICD-10-CM

## 2019-11-20 ENCOUNTER — Encounter: Payer: Self-pay | Admitting: Pulmonary Disease

## 2019-12-11 ENCOUNTER — Ambulatory Visit
Admission: RE | Admit: 2019-12-11 | Discharge: 2019-12-11 | Disposition: A | Discharge status: Home / Self Care | Payer: Medicare Other | Source: Ambulatory Visit | Attending: Family | Admitting: Family

## 2019-12-11 ENCOUNTER — Other Ambulatory Visit: Payer: Self-pay | Admitting: Family

## 2019-12-11 DIAGNOSIS — S93402A Sprain of unspecified ligament of left ankle, initial encounter: Secondary | ICD-10-CM

## 2019-12-28 ENCOUNTER — Telehealth: Payer: Self-pay | Admitting: Pulmonary Disease

## 2019-12-28 NOTE — Telephone Encounter (Signed)
Got a call from patient stating that she is weak with increased cough, dyspnea Review of records show that she does not follow-up at [facility]  Her pulmonologist is Elijio Miles NP, [place] the patient to call the pulmonary service at [facility] for help.  Marshell Garfinkel MD [facility] 12/28/2019, 11:04 AM

## 2019-12-29 ENCOUNTER — Institutional Professional Consult (permissible substitution): Payer: Self-pay | Admitting: Pulmonary Disease

## 2020-02-03 ENCOUNTER — Emergency Department (HOSPITAL_BASED_OUTPATIENT_CLINIC_OR_DEPARTMENT_OTHER): Payer: Medicare Other

## 2020-02-03 ENCOUNTER — Emergency Department (HOSPITAL_BASED_OUTPATIENT_CLINIC_OR_DEPARTMENT_OTHER)
Admission: EM | Admit: 2020-02-03 | Discharge: 2020-02-03 | Disposition: A | Discharge status: Home / Self Care | Payer: Medicare Other | Attending: Emergency Medicine | Admitting: Emergency Medicine

## 2020-02-03 ENCOUNTER — Encounter (HOSPITAL_BASED_OUTPATIENT_CLINIC_OR_DEPARTMENT_OTHER): Payer: Self-pay | Admitting: Emergency Medicine

## 2020-02-03 ENCOUNTER — Other Ambulatory Visit: Payer: Self-pay

## 2020-02-03 DIAGNOSIS — J449 Chronic obstructive pulmonary disease, unspecified: Secondary | ICD-10-CM | POA: Insufficient documentation

## 2020-02-03 DIAGNOSIS — E876 Hypokalemia: Secondary | ICD-10-CM | POA: Diagnosis not present

## 2020-02-03 DIAGNOSIS — Z87891 Personal history of nicotine dependence: Secondary | ICD-10-CM | POA: Insufficient documentation

## 2020-02-03 DIAGNOSIS — R1012 Left upper quadrant pain: Secondary | ICD-10-CM | POA: Diagnosis present

## 2020-02-03 DIAGNOSIS — S299XXA Unspecified injury of thorax, initial encounter: Secondary | ICD-10-CM | POA: Diagnosis not present

## 2020-02-03 DIAGNOSIS — R109 Unspecified abdominal pain: Secondary | ICD-10-CM | POA: Diagnosis not present

## 2020-02-03 DIAGNOSIS — W19XXXA Unspecified fall, initial encounter: Secondary | ICD-10-CM

## 2020-02-03 DIAGNOSIS — R1032 Left lower quadrant pain: Secondary | ICD-10-CM | POA: Diagnosis not present

## 2020-02-03 LAB — COMPREHENSIVE METABOLIC PANEL
ALT: 25 U/L (ref 0–44)
AST: 21 U/L (ref 15–41)
Albumin: 3.4 g/dL — ABNORMAL LOW (ref 3.5–5.0)
Alkaline Phosphatase: 43 U/L (ref 38–126)
Anion gap: 10 (ref 5–15)
BUN: 8 mg/dL (ref 8–23)
CO2: 27 mmol/L (ref 22–32)
Calcium: 8.3 mg/dL — ABNORMAL LOW (ref 8.9–10.3)
Chloride: 105 mmol/L (ref 98–111)
Creatinine, Ser: 0.47 mg/dL (ref 0.44–1.00)
GFR calc Af Amer: >60 mL/min (ref >60)
GFR calc non Af Amer: >60 mL/min (ref >60)
Glucose, Bld: 110 mg/dL — ABNORMAL HIGH (ref 70–99)
Potassium: 2.5 mmol/L — CL (ref 3.5–5.1)
Sodium: 142 mmol/L (ref 135–145)
Total Bilirubin: 0.3 mg/dL (ref 0.3–1.2)
Total Protein: 6.1 g/dL — ABNORMAL LOW (ref 6.5–8.1)

## 2020-02-03 LAB — CBC
HCT: 40.3 % (ref 36.0–46.0)
Hemoglobin: 13.2 g/dL (ref 12.0–15.0)
MCH: 31.4 pg (ref 26.0–34.0)
MCHC: 32.8 g/dL (ref 30.0–36.0)
MCV: 96 fL (ref 80.0–100.0)
Platelets: 354 10*3/uL (ref 150–400)
RBC: 4.2 MIL/uL (ref 3.87–5.11)
RDW: 15.9 % — ABNORMAL HIGH (ref 11.5–15.5)
WBC: 9.5 10*3/uL (ref 4.0–10.5)
nRBC: 0 % (ref 0.0–0.2)

## 2020-02-03 MED ORDER — POTASSIUM CHLORIDE CRYS ER 20 MEQ PO TBCR: 20.0000 meq | EXTENDED_RELEASE_TABLET | Freq: Two times a day (BID) | ORAL | 0 refills | Status: DC

## 2020-02-03 MED ORDER — ACETAMINOPHEN 500 MG PO TABS
1000.0000 mg | ORAL_TABLET | Freq: Once | ORAL | Status: AC
  Administered 2020-02-03: 1000 mg via ORAL
  Filled 2020-02-03: qty 2

## 2020-02-03 MED ORDER — POTASSIUM CHLORIDE CRYS ER 20 MEQ PO TBCR
40.0000 meq | EXTENDED_RELEASE_TABLET | Freq: Once | ORAL | Status: AC
  Administered 2020-02-03: 40 meq via ORAL
  Filled 2020-02-03: qty 2

## 2020-02-03 MED ORDER — IOHEXOL 300 MG/ML  SOLN
100.0000 mL | Freq: Once | INTRAMUSCULAR | Status: AC | PRN
  Administered 2020-02-03: 100 mL via INTRAVENOUS

## 2020-02-03 NOTE — ED Provider Notes (Signed)
[facility] Emergency Department Provider Note MRN:  [id number]  Arrival date & time: 02/03/20     Chief Complaint   Fall   History of Present Illness   Jill Spence is a 69 y.o. year-old female with a history of COPD presenting to the ED with chief complaint of fall.  Patient fell 2 days ago.  Explains that she was reaching for something and lost her balance and fell onto her left side.  Endorsing pain to the left side of the abdomen with bruising.  Denies chest pain or shortness of breath.  Endorsing weakness, lightheadedness, but this has been fairly constant since being discharged from the hospital.  Recovering from COVID-19.  Denies fever, no cough, no blood in the urine.  Review of Systems  A complete 10 system review of systems was obtained and all systems are negative except as noted in the HPI and PMH.   Patient's Health History    Past Medical History:  Diagnosis Date  . Anemia    low iron  . Anxiety   . Arthritis   . Bipolar disorder ([place])   . COPD (chronic obstructive pulmonary disease) ([place])   . Family history of adverse reaction to anesthesia    mom "was put to sleep and she never woke up" -   . GERD (gastroesophageal reflux disease)   . Guillain Barr syndrome ([facility])    numbness in toes, legs hurt  . Headache    migraines as a teenager  . IBS (irritable bowel syndrome)   . Lump, breast    removed years ago per Pt.  . Pneumonia   . Restless leg syndrome   . Seizures ([place])    only has one when she gets upset, has "quiet' seizures    Past Surgical History:  Procedure Laterality Date  . BREAST SURGERY    . COLONOSCOPY    . EYE SURGERY Bilateral    cataract surgery with lens implants  . MANDIBLE RECONSTRUCTION    . MINOR REMOVAL OF MANDIBULAR HARDWARE Right 10/15/2015   Procedure: MINOR REMOVAL OF Right MANDIBULAR HARDWARE;  Surgeon: Diona Browner, DDS;  Location: [facility];  Service: Oral Surgery;  Laterality: Right;  .  TONSILLECTOMY      Family History  Problem Relation Age of Onset  . Cancer Father     Social History   Socioeconomic History  . Marital status: Widowed    Spouse name: Not on file  . Number of children: Not on file  . Years of education: Not on file  . Highest education level: Not on file  Occupational History  . Not on file  Tobacco Use  . Smoking status: Former Smoker    Quit date: 10/14/2007    Years since quitting: 12.3  . Smokeless tobacco: Never Used  Substance and Sexual Activity  . Alcohol use: No  . Drug use: No  . Sexual activity: Not on file  Other Topics Concern  . Not on file  Social History Narrative  . Not on file   Social Determinants of Health   Financial Resource Strain:   . Difficulty of Paying Living Expenses: Not on file  Food Insecurity:   . Worried About Charity fundraiser in the Last Year: Not on file  . Ran Out of Food in the Last Year: Not on file  Transportation Needs:   . Lack of Transportation (Medical): Not on file  . Lack of Transportation (Non-Medical): Not on file  Physical Activity:   . Days of Exercise per Week: Not on file  . Minutes of Exercise per Session: Not on file  Stress:   . Feeling of Stress : Not on file  Social Connections:   . Frequency of Communication with Friends and Family: Not on file  . Frequency of Social Gatherings with Friends and Family: Not on file  . Attends Religious Services: Not on file  . Active Member of Clubs or Organizations: Not on file  . Attends Archivist Meetings: Not on file  . Marital Status: Not on file  Intimate Partner Violence:   . Fear of Current or Ex-Partner: Not on file  . Emotionally Abused: Not on file  . Physically Abused: Not on file  . Sexually Abused: Not on file     Physical Exam   Vitals:   02/03/20 0800 02/03/20 0804  BP:  (!) 144/82  Pulse:  76  Resp:  20  Temp:  98.2 F (36.8 C)  SpO2: 96% 92%    CONSTITUTIONAL: Chronically ill-appearing,  NAD NEURO:  Alert and oriented x 3, no focal deficits EYES:  eyes equal and reactive ENT/NECK:  no LAD, no JVD CARDIO: Regular rate, well-perfused, normal S1 and S2 PULM:  CTAB no wheezing or rhonchi GI/GU:  normal bowel sounds, non-distended, non-tender MSK/SPINE:  No gross deformities, no edema SKIN: Bruising to the left flank PSYCH:  Appropriate speech and behavior  *Additional and/or pertinent findings included in MDM below  Diagnostic and Interventional Summary    EKG Interpretation  Date/Time:  Tuesday February 03 2020 12:05:31 EST Ventricular Rate:  71 PR Interval:    QRS Duration: 93 QT Interval:  405 QTC Calculation: 441 R Axis:   79 Text Interpretation: Sinus rhythm Borderline T wave abnormalities No previous ECGs available Confirmed by Gerlene Fee [id number]) on 02/03/2020 12:08:09 PM      Cardiac Monitoring Interpretation:  Labs Reviewed  CBC - Abnormal; Notable for the following components:      Result Value   RDW 15.9 (*)    All other components within normal limits  COMPREHENSIVE METABOLIC PANEL - Abnormal; Notable for the following components:   Potassium 2.5 (*)    Glucose, Bld 110 (*)    Calcium 8.3 (*)    Total Protein 6.1 (*)    Albumin 3.4 (*)    All other components within normal limits    DG Chest 2 View  Final Result    CT ABDOMEN PELVIS W CONTRAST  Final Result      Medications  acetaminophen (TYLENOL) tablet 1,000 mg (1,000 mg Oral Given 02/03/20 0833)  potassium chloride SA (KLOR-CON) CR tablet 40 mEq (40 mEq Oral Given 02/03/20 1111)  iohexol (OMNIPAQUE) 300 MG/ML solution 100 mL (100 mLs Intravenous Contrast Given 02/03/20 1047)     Procedures  /  Critical Care Procedures  ED Course and Medical Decision Making  I have reviewed the triage vital signs, the nursing notes, and pertinent available records from the EMR.  Pertinent labs & imaging results that were available during my care of the patient were reviewed by me and considered in my  medical decision making (see below for details).     We will obtain chest x-ray to exclude rib fracture and pneumothorax, will obtain CT abdomen to exclude solid organ blunt trauma.  Bruising is overlying the area of the spleen.  12 PM update: Imaging is reassuring, no significant injuries.  Labs reveal an incidental finding of hypokalemia, at  2.5.  No prior potassium level since 2018 through care everywhere.  Unclear chronicity, patient has been feeling very weak for a long time.  No signs of cardiac ectopy, normal EKG.  Patient repleted with potassium here in the emergency department.  Admission offered for continued repletion in a more controlled setting, however patient declines this option explaining that she was just recently discharged from the hospital and does not wish to return if it can be helped.  Explained the risk and benefit but still she wishes to go home.  She promises to increase the potassium in her diet and take the potassium supplements provided and she has a primary care doctor appointment already established in a few days where her potassium level can be rechecked.  Barth Kirks. Sedonia Small, [place] [email or web address]  Final Clinical Impressions(s) / ED Diagnoses     ICD-10-CM   1. Fall, initial encounter  W19.XXXA   2. Hypokalemia  E87.6     ED Discharge Orders         Ordered    potassium chloride SA (KLOR-CON) 20 MEQ tablet  2 times daily     02/03/20 1154           Discharge Instructions Discussed with and Provided to Patient:     Discharge Instructions     You were evaluated in the Emergency Department and after careful evaluation, we did not find any emergent condition requiring admission or further testing in the hospital.  Your exam/testing today is overall reassuring.  Your x-rays and CT scans today did not show any significant injuries.  Your blood test today showed a very low potassium.  The potassium  level was 2.5.  We offered to keep you in the hospital but you preferred to go home.  It is very important that you take the potassium pills twice daily as directed and follow-up with your primary care doctor on Monday to have your potassium levels rechecked.  Please return to the Emergency Department if you experience any worsening of your condition.  We encourage you to follow up with a primary care provider.  Thank you for allowing [place] to be a part of your care.       Maudie Flakes, MD 02/03/20 1210

## 2020-02-03 NOTE — ED Notes (Signed)
Unable to obtain IV access or blood specimen, unsuccessful ultrasound IV attempt by MD. CT and lab delayed until further attempt.

## 2020-02-03 NOTE — ED Triage Notes (Signed)
Per EMS pt had a fall 2 days ago, lost balance while getting up from bed, landed on left side , obvious bruising to left abdomen, persistent pain . Was seen yesterday at [facility] . Home O2 . Alert and oriented x 4.

## 2020-02-03 NOTE — Discharge Instructions (Addendum)
You were evaluated in the Emergency Department and after careful evaluation, we did not find any emergent condition requiring admission or further testing in the hospital.  Your exam/testing today is overall reassuring.  Your x-rays and CT scans today did not show any significant injuries.  Your blood test today showed a very low potassium.  The potassium level was 2.5.  We offered to keep you in the hospital but you preferred to go home.  It is very important that you take the potassium pills twice daily as directed and follow-up with your primary care doctor on Monday to have your potassium levels rechecked.  Please return to the Emergency Department if you experience any worsening of your condition.  We encourage you to follow up with a primary care provider.  Thank you for allowing [place] to be a part of your care.

## 2020-02-16 DIAGNOSIS — M549 Dorsalgia, unspecified: Secondary | ICD-10-CM | POA: Diagnosis not present

## 2020-03-04 LAB — HM COLONOSCOPY

## 2020-07-06 ENCOUNTER — Ambulatory Visit: Payer: Medicare Other | Admitting: Endocrinology

## 2020-07-27 ENCOUNTER — Encounter: Payer: Self-pay | Admitting: Pulmonary Disease

## 2020-07-27 ENCOUNTER — Other Ambulatory Visit: Payer: Self-pay

## 2020-07-27 ENCOUNTER — Ambulatory Visit (INDEPENDENT_AMBULATORY_CARE_PROVIDER_SITE_OTHER): Payer: Medicare Other | Admitting: Pulmonary Disease

## 2020-07-27 VITALS — BP 112/62 | HR 67 | Temp 98.1°F | Ht 63.5 in | Wt 147.2 lb

## 2020-07-27 DIAGNOSIS — R911 Solitary pulmonary nodule: Secondary | ICD-10-CM

## 2020-07-27 DIAGNOSIS — J439 Emphysema, unspecified: Secondary | ICD-10-CM

## 2020-07-27 DIAGNOSIS — J449 Chronic obstructive pulmonary disease, unspecified: Secondary | ICD-10-CM

## 2020-07-27 LAB — CBC WITH DIFFERENTIAL/PLATELET
Basophils Absolute: 0.1 10*3/uL (ref 0.0–0.1)
Basophils Relative: 0.9 % (ref 0.0–3.0)
Eosinophils Absolute: 0.1 10*3/uL (ref 0.0–0.7)
Eosinophils Relative: 1 % (ref 0.0–5.0)
HCT: 41.1 % (ref 36.0–46.0)
Hemoglobin: 13.7 g/dL (ref 12.0–15.0)
Lymphocytes Relative: 39.8 % (ref 12.0–46.0)
Lymphs Abs: 2.9 10*3/uL (ref 0.7–4.0)
MCHC: 33.3 g/dL (ref 30.0–36.0)
MCV: 93.6 fl (ref 78.0–100.0)
Monocytes Absolute: 0.6 10*3/uL (ref 0.1–1.0)
Monocytes Relative: 8.3 % (ref 3.0–12.0)
Neutro Abs: 3.7 10*3/uL (ref 1.4–7.7)
Neutrophils Relative %: 50 % (ref 43.0–77.0)
Platelets: 281 10*3/uL (ref 150.0–400.0)
RBC: 4.39 Mil/uL (ref 3.87–5.11)
RDW: 14.3 % (ref 11.5–15.5)
WBC: 7.3 10*3/uL (ref 4.0–10.5)

## 2020-07-27 MED ORDER — IPRATROPIUM-ALBUTEROL 0.5-2.5 (3) MG/3ML IN SOLN: 3.0000 mL | RESPIRATORY_TRACT | 3 refills | Status: DC | PRN

## 2020-07-27 NOTE — Progress Notes (Signed)
Jill Spence    [id number]    29-Jul-1951  Primary Care Physician:Patient, No Pcp Per  Referring Physician: Sonia Side., FNP [place],  [place] [address]  Chief complaint: Consult for COPD  HPI: 69 year old with history of COPD on supplemental oxygen, lung nodules.  Previously followed at [facility]. Maintained on [place] and now Trelegy inhaler.  She has had recurrent exacerbations over the past year requiring prednisone, antibiotics. She is also being followed with serial CT scans for lung nodule  Chief complaint is dyspnea on exertion, chronic cough with mucus production.  Pets: Has a dog Occupation: Retired Recruitment consultant Exposures: Reports mold in the previous apartment.  No current mold exposure.  No hot tub, Jacuzzi Smoking history: States that she quit smoking in 2008.  Cannot give me a clear idea of how much she smoked prior Travel history: No significant travel history Relevant family history: No significant family history of lung disease  Outpatient Encounter Medications as of 07/27/2020  Medication Sig  . albuterol (PROVENTIL HFA;VENTOLIN HFA) 108 (90 BASE) MCG/ACT inhaler Inhale 2 puffs into the lungs every 6 (six) hours as needed for wheezing.  Marland Kitchen albuterol (PROVENTIL) (2.5 MG/3ML) 0.083% nebulizer solution Take 2.5 mg by nebulization every 6 (six) hours as needed for wheezing.  Marland Kitchen alendronate (FOSAMAX) 70 MG tablet Take 70 mg by mouth every Sunday. Take with a full glass of water on an empty stomach.  . ARIPiprazole (ABILIFY) 15 MG tablet Take 15 mg by mouth daily.  . benztropine (COGENTIN) 0.5 MG tablet Take 0.5 mg by mouth 2 (two) times daily.  . famciclovir (FAMVIR) 250 MG tablet Take 250 mg by mouth 2 (two) times daily.  . fluticasone (FLOVENT DISKUS) 50 MCG/BLIST diskus inhaler Inhale 1 puff into the lungs 2 (two) times daily.  . Fluticasone-Salmeterol (ADVAIR) 500-50 MCG/DOSE AEPB Inhale 1 puff into the lungs every 12 (twelve) hours.  .  Fluticasone-Umeclidin-Vilant (TRELEGY ELLIPTA) 100-62.5-25 MCG/INH AEPB Inhale into the lungs.  . gabapentin (NEURONTIN) 300 MG capsule Take 600 mg by mouth 2 (two) times daily.  . magnesium oxide (MAG-OX) 400 MG tablet Take 250 mg by mouth daily.  Marland Kitchen omeprazole (PRILOSEC) 40 MG capsule Take 40 mg by mouth daily.  Marland Kitchen oxcarbazepine (TRILEPTAL) 600 MG tablet Take 600 mg by mouth 2 (two) times daily.  Marland Kitchen oxyCODONE-acetaminophen (PERCOCET) 5-325 MG tablet Take 1-2 tablets by mouth every 4 (four) hours as needed.  . ranitidine (ZANTAC) 300 MG capsule Take 300 mg by mouth every evening.  . traMADol (ULTRAM) 50 MG tablet Take 50 mg by mouth every 6 (six) hours as needed for pain.  . traZODone (DESYREL) 50 MG tablet Take 50 mg by mouth at bedtime.  . Vitamin D, Ergocalciferol, (DRISDOL) 50000 UNITS CAPS capsule Take 50,000 Units by mouth every Sunday.   . potassium chloride SA (KLOR-CON) 20 MEQ tablet Take 1 tablet (20 mEq total) by mouth 2 (two) times daily for 7 days.   No facility-administered encounter medications on file as of 07/27/2020.    Allergies as of 07/27/2020 - Review Complete 07/27/2020  Allergen Reaction Noted  . Aspirin  08/04/2013  . Influenza vaccines  08/04/2013    Past Medical History:  Diagnosis Date  . Anemia    low iron  . Anxiety   . Arthritis   . Bipolar disorder ([place])   . COPD (chronic obstructive pulmonary disease) ([place])   . Family history of adverse reaction to  anesthesia    mom "was put to sleep and she never woke up" -   . GERD (gastroesophageal reflux disease)   . Guillain Barr syndrome ([facility])    numbness in toes, legs hurt  . Headache    migraines as a teenager  . IBS (irritable bowel syndrome)   . Lump, breast    removed years ago per Pt.  . Pneumonia   . Restless leg syndrome   . Seizures ([place])    only has one when she gets upset, has "quiet' seizures    Past Surgical History:  Procedure Laterality Date  . BREAST SURGERY    . COLONOSCOPY    .  EYE SURGERY Bilateral    cataract surgery with lens implants  . MANDIBLE RECONSTRUCTION    . MINOR REMOVAL OF MANDIBULAR HARDWARE Right 10/15/2015   Procedure: MINOR REMOVAL OF Right MANDIBULAR HARDWARE;  Surgeon: Diona Browner, DDS;  Location: [facility];  Service: Oral Surgery;  Laterality: Right;  . TONSILLECTOMY      Family History  Problem Relation Age of Onset  . Cancer Father     Social History   Socioeconomic History  . Marital status: Widowed    Spouse name: Not on file  . Number of children: Not on file  . Years of education: Not on file  . Highest education level: Not on file  Occupational History  . Not on file  Tobacco Use  . Smoking status: Former Smoker    Packs/day: 0.25    Years: 6.00    Pack years: 1.50    Types: Cigarettes    Quit date: 10/14/2007    Years since quitting: 12.7  . Smokeless tobacco: Never Used  Substance and Sexual Activity  . Alcohol use: No  . Drug use: No  . Sexual activity: Not on file  Other Topics Concern  . Not on file  Social History Narrative  . Not on file   Social Determinants of Health   Financial Resource Strain:   . Difficulty of Paying Living Expenses: Not on file  Food Insecurity:   . Worried About Charity fundraiser in the Last Year: Not on file  . Ran Out of Food in the Last Year: Not on file  Transportation Needs:   . Lack of Transportation (Medical): Not on file  . Lack of Transportation (Non-Medical): Not on file  Physical Activity:   . Days of Exercise per Week: Not on file  . Minutes of Exercise per Session: Not on file  Stress:   . Feeling of Stress : Not on file  Social Connections:   . Frequency of Communication with Friends and Family: Not on file  . Frequency of Social Gatherings with Friends and Family: Not on file  . Attends Religious Services: Not on file  . Active Member of Clubs or Organizations: Not on file  . Attends Archivist Meetings: Not on file  . Marital Status: Not on file   Intimate Partner Violence:   . Fear of Current or Ex-Partner: Not on file  . Emotionally Abused: Not on file  . Physically Abused: Not on file  . Sexually Abused: Not on file    Review of systems: Review of Systems  Constitutional: Negative for fever and chills.  HENT: Negative.   Eyes: Negative for blurred vision.  Respiratory: as per HPI  Cardiovascular: Negative for chest pain and palpitations.  Gastrointestinal: Negative for vomiting, diarrhea, blood per rectum. Genitourinary: Negative for dysuria, urgency, frequency  and hematuria.  Musculoskeletal: Negative for myalgias, back pain and joint pain.  Skin: Negative for itching and rash.  Neurological: Negative for dizziness, tremors, focal weakness, seizures and loss of consciousness.  Endo/Heme/Allergies: Negative for environmental allergies.  Psychiatric/Behavioral: Negative for depression, suicidal ideas and hallucinations.  All other systems reviewed and are negative.  Physical Exam: Blood pressure 112/62, pulse 67, temperature 98.1 F (36.7 C), temperature source Other (Comment), height 5' 3.5" (1.613 m), weight 147 lb 3.2 oz (66.8 kg), SpO2 96 %. Gen:      No acute distress HEENT:  EOMI, sclera anicteric Neck:     No masses; no thyromegaly Lungs:    Clear to auscultation bilaterally; normal respiratory effort CV:         Regular rate and rhythm; no murmurs Abd:      + bowel sounds; soft, non-tender; no palpable masses, no distension Ext:    No edema; adequate peripheral perfusion Skin:      Warm and dry; no rash Neuro: alert and oriented x 3 Psych: normal mood and affect  Data Reviewed: Imaging: CT chest 03/29/2020-cirrhosis, thyroid nodule, severe emphysema  A new 4 mm nodule is seen along the minor fissure (3/61). 6 mm nodule in the lateral  segment right middle lobe (3/69) has enlarged from 3 mm on  01/01/2019. 2 mm anterior right lower lobe nodule (3/76) is new.  Scarring in the apex of the left upper lobe  with a new nodular  component measuring 6 mm (3/12). 3 mm lingular nodule (3/63) is new.  Additional tiny pulmonary nodules in the left lung are stable. No  pleural fluid. Airway is unremarkable.   CT chest 07/14/2020-new 5 mm nodule in the right middle lobe.  Remainder of the lung nodules are stable.  Enlarged pulmonary trunk   Assessment:  Severe COPD with exacerbations Currently on Trelegy inhaler. Start duo nebs Check CBC, IgE and alpha-1 antitrypsin for baseline assessment Pulmonary function test  Pulmonary nodules Serial CTs from [facility] reviewed.  The last CT shows new 5 mm nodule well other nodules are stable We will order a follow-up CT in 3 months for reevaluation.  Thyroid nodule She has been set up with endocrinology for further evaluation.  Plan/Recommendations: Continue Trelegy Duo nebs CBC, IgE, alpha-1 antitrypsin CT chest in 3 months   Marshell Garfinkel MD [facility] 07/27/2020, 2:23 PM  CC: Sonia Side., FNP

## 2020-07-27 NOTE — Patient Instructions (Signed)
Will check CBC differential, IgE, alpha-1 antitrypsin levels and phenotype Schedule pulmonary function test CT chest without contrast for lung nodule in 3 months We will also order duo nebs every 4 hours as needed and nebulizer machine  Follow-up in 3 months.

## 2020-08-02 ENCOUNTER — Telehealth (HOSPITAL_COMMUNITY): Payer: Self-pay

## 2020-08-02 NOTE — Telephone Encounter (Signed)
Pt called back and stated that she wanted to do pulmonary rehab at [facility] [place] pt that that facility does not do pulmonary rehab. Pt stated that she doesn't want to go to [facility] because its too cold in the facility. Pt's helper Verdis Frederickson stated that pt will call back when she is finished with PT. Advised pt that she can not do PT and pulmonary rehab at the same time, pt understood and stated she will call back after she finished PT. Also advised pt to call her pulmonologist to see where else she can go for pulmonary rehab other than [facility] and [facility].

## 2020-08-07 LAB — ALPHA-1 ANTITRYPSIN PHENOTYPE: A-1 Antitrypsin, Ser: 170 mg/dL (ref 83–199)

## 2020-08-07 LAB — IGE: IgE (Immunoglobulin E), Serum: 5 kU/L (ref <=114)

## 2020-08-10 ENCOUNTER — Other Ambulatory Visit: Payer: Self-pay

## 2020-08-10 ENCOUNTER — Ambulatory Visit (INDEPENDENT_AMBULATORY_CARE_PROVIDER_SITE_OTHER): Payer: Medicare Other | Admitting: Endocrinology

## 2020-08-10 ENCOUNTER — Encounter: Payer: Self-pay | Admitting: Endocrinology

## 2020-08-10 DIAGNOSIS — E0590 Thyrotoxicosis, unspecified without thyrotoxic crisis or storm: Secondary | ICD-10-CM

## 2020-08-10 NOTE — Patient Instructions (Addendum)
Let's check the ultrasound, and nuclear medicine test.  you will receive a phone call, about a days and times for appointments. Then i'll prescribe for you a pill to slow the thyroid. Please come back for a follow-up appointment in 2 months.       Hyperthyroidism  Hyperthyroidism is when the thyroid gland is too active (overactive). The thyroid gland is a small gland located in the lower front part of the neck, just in front of the windpipe (trachea). This gland makes hormones that help control how the body uses food for energy (metabolism) as well as how the heart and brain function. These hormones also play a role in keeping your bones strong. When the thyroid is overactive, it produces too much of a hormone called thyroxine. What are the causes? This condition may be caused by:  Graves' disease. This is a disorder in which the body's disease-fighting system (immune system) attacks the thyroid gland. This is the most common cause.  Inflammation of the thyroid gland.  A tumor in the thyroid gland.  Use of certain medicines, including: ? Prescription thyroid hormone replacement. ? Herbal supplements that mimic thyroid hormones. ? Amiodarone therapy.  Solid or fluid-filled lumps within your thyroid gland (thyroid nodules).  Taking in a large amount of iodine from foods or medicines. What increases the risk? You are more likely to develop this condition if:  You are female.  You have a family history of thyroid conditions.  You smoke tobacco.  You use a medicine called lithium.  You take medicines that affect the immune system (immunosuppressants). What are the signs or symptoms? Symptoms of this condition include:  Nervousness.  Inability to tolerate heat.  Unexplained weight loss.  Diarrhea.  Change in the texture of hair or skin.  Heart skipping beats or making extra beats.  Rapid heart rate.  Loss of menstruation.  Shaky  hands.  Fatigue.  Restlessness.  Sleep problems.  Enlarged thyroid gland or a lump in the thyroid (nodule). You may also have symptoms of Graves' disease, which may include:  Protruding eyes.  Dry eyes.  Red or swollen eyes.  Problems with vision. How is this diagnosed? This condition may be diagnosed based on:  Your symptoms and medical history.  A physical exam.  Blood tests.  Thyroid ultrasound. This test involves using sound waves to produce images of the thyroid gland.  A thyroid scan. A radioactive substance is injected into a vein, and images show how much iodine is present in the thyroid.  Radioactive iodine uptake test (RAIU). A small amount of radioactive iodine is given by mouth to see how much iodine the thyroid absorbs after a certain amount of time. How is this treated? Treatment depends on the cause and severity of the condition. Treatment may include:  Medicines to reduce the amount of thyroid hormone your body makes.  Radioactive iodine treatment (radioiodine therapy). This involves swallowing a small dose of radioactive iodine, in capsule or liquid form, to kill thyroid cells.  Surgery to remove part or all of your thyroid gland. You may need to take thyroid hormone replacement medicine for the rest of your life after thyroid surgery.  Medicines to help manage your symptoms. Follow these instructions at home:   Take over-the-counter and prescription medicines only as told by your health care provider.  Do not use any products that contain nicotine or tobacco, such as cigarettes and e-cigarettes. If you need help quitting, ask your health care provider.  Follow any  instructions from your health care provider about diet. You may be instructed to limit foods that contain iodine.  Keep all follow-up visits as told by your health care provider. This is important. ? You will need to have blood tests regularly so that your health care provider can  monitor your condition. Contact a health care provider if:  Your symptoms do not get better with treatment.  You have a fever.  You are taking thyroid hormone replacement medicine and you: ? Have symptoms of depression. ? Feel like you are tired all the time. ? Gain weight. Get help right away if:  You have chest pain.  You have decreased alertness or a change in your awareness.  You have abdominal pain.  You feel dizzy.  You have a rapid heartbeat.  You have an irregular heartbeat.  You have difficulty breathing. Summary  The thyroid gland is a small gland located in the lower front part of the neck, just in front of the windpipe (trachea).  Hyperthyroidism is when the thyroid gland is too active (overactive) and produces too much of a hormone called thyroxine.  The most common cause is Graves' disease, a disorder in which your immune system attacks the thyroid gland.  Hyperthyroidism can cause various symptoms, such as unexplained weight loss, nervousness, inability to tolerate heat, or changes in your heartbeat.  Treatment may include medicine to reduce the amount of thyroid hormone your body makes, radioiodine therapy, surgery, or medicines to manage symptoms. This information is not intended to replace advice given to you by your health care provider. Make sure you discuss any questions you have with your health care provider. Document Revised: 11/02/2017 Document Reviewed: 10/31/2017 Elsevier Patient Education  2020 [employer].

## 2020-08-10 NOTE — Progress Notes (Signed)
Subjective:    Patient ID: Jill Spence, female    DOB: Feb 01, 1951, 69 y.o.   MRN: [id number]  HPI Pt is referred by Dustin Folks, NP, for hyperthyroidism.  Pt reports she was dx'ed with hyperthyroidism in 2021.  she has never been on therapy for this.  she has never had XRT to the anterior neck, or thyroid surgery.  she has never had dedicated thyroid imaging.  she does not consume non-prescribed thyroid medication.  she has never been on amiodarone.  Main symptom is solid=liquid dysphagia.  She cannot be isolated for RAI rx.  Pt lives alone, but has Water quality scientist.   Past Medical History:  Diagnosis Date  . Anemia    low iron  . Anxiety   . Arthritis   . Bipolar disorder ([place])   . COPD (chronic obstructive pulmonary disease) ([place])   . Family history of adverse reaction to anesthesia    mom "was put to sleep and she never woke up" -   . GERD (gastroesophageal reflux disease)   . Guillain Barr syndrome ([facility])    numbness in toes, legs hurt  . Headache    migraines as a teenager  . IBS (irritable bowel syndrome)   . Lump, breast    removed years ago per Pt.  . Pneumonia   . Restless leg syndrome   . Seizures ([place])    only has one when she gets upset, has "quiet' seizures    Past Surgical History:  Procedure Laterality Date  . BREAST SURGERY    . COLONOSCOPY    . EYE SURGERY Bilateral    cataract surgery with lens implants  . MANDIBLE RECONSTRUCTION    . MINOR REMOVAL OF MANDIBULAR HARDWARE Right 10/15/2015   Procedure: MINOR REMOVAL OF Right MANDIBULAR HARDWARE;  Surgeon: Diona Browner, DDS;  Location: [facility];  Service: Oral Surgery;  Laterality: Right;  . TONSILLECTOMY      Social History   Socioeconomic History  . Marital status: Widowed    Spouse name: Not on file  . Number of children: Not on file  . Years of education: Not on file  . Highest education level: Not on file  Occupational History  . Not on file  Tobacco Use  . Smoking status: Former Smoker     Packs/day: 0.25    Years: 6.00    Pack years: 1.50    Types: Cigarettes    Quit date: 10/14/2007    Years since quitting: 12.8  . Smokeless tobacco: Never Used  Substance and Sexual Activity  . Alcohol use: No  . Drug use: No  . Sexual activity: Not on file  Other Topics Concern  . Not on file  Social History Narrative  . Not on file   Social Determinants of Health   Financial Resource Strain:   . Difficulty of Paying Living Expenses: Not on file  Food Insecurity:   . Worried About Charity fundraiser in the Last Year: Not on file  . Ran Out of Food in the Last Year: Not on file  Transportation Needs:   . Lack of Transportation (Medical): Not on file  . Lack of Transportation (Non-Medical): Not on file  Physical Activity:   . Days of Exercise per Week: Not on file  . Minutes of Exercise per Session: Not on file  Stress:   . Feeling of Stress : Not on file  Social Connections:   . Frequency of Communication with Friends and Family: Not on  file  . Frequency of Social Gatherings with Friends and Family: Not on file  . Attends Religious Services: Not on file  . Active Member of Clubs or Organizations: Not on file  . Attends Archivist Meetings: Not on file  . Marital Status: Not on file  Intimate Partner Violence:   . Fear of Current or Ex-Partner: Not on file  . Emotionally Abused: Not on file  . Physically Abused: Not on file  . Sexually Abused: Not on file    Current Outpatient Medications on File Prior to Visit  Medication Sig Dispense Refill  . albuterol (PROVENTIL HFA;VENTOLIN HFA) 108 (90 BASE) MCG/ACT inhaler Inhale 2 puffs into the lungs every 6 (six) hours as needed for wheezing.    Marland Kitchen albuterol (PROVENTIL) (2.5 MG/3ML) 0.083% nebulizer solution Take 2.5 mg by nebulization every 6 (six) hours as needed for wheezing.    Marland Kitchen alendronate (FOSAMAX) 70 MG tablet Take 70 mg by mouth every Sunday. Take with a full glass of water on an empty stomach.    .  ARIPiprazole (ABILIFY) 15 MG tablet Take 15 mg by mouth daily.    . benzonatate (TESSALON) 200 MG capsule Take 200 mg by mouth 3 (three) times daily as needed for cough.    . benztropine (COGENTIN) 0.5 MG tablet Take 0.5 mg by mouth 2 (two) times daily.    . cetirizine (ZYRTEC) 10 MG tablet Take 10 mg by mouth daily.    . famciclovir (FAMVIR) 250 MG tablet Take 250 mg by mouth 2 (two) times daily.    . Fluticasone-Salmeterol (ADVAIR) 500-50 MCG/DOSE AEPB Inhale 1 puff into the lungs every 12 (twelve) hours.    . Fluticasone-Umeclidin-Vilant (TRELEGY ELLIPTA) 100-62.5-25 MCG/INH AEPB Inhale into the lungs.    . gabapentin (NEURONTIN) 300 MG capsule Take 600 mg by mouth 2 (two) times daily.    Marland Kitchen levofloxacin (LEVAQUIN) 750 MG tablet Take 750 mg by mouth daily.    . magnesium oxide (MAG-OX) 400 MG tablet Take 250 mg by mouth daily.    Marland Kitchen omeprazole (PRILOSEC) 40 MG capsule Take 40 mg by mouth daily.    Marland Kitchen oxcarbazepine (TRILEPTAL) 600 MG tablet Take 600 mg by mouth 2 (two) times daily.    Marland Kitchen oxyCODONE-acetaminophen (PERCOCET) 5-325 MG tablet Take 1-2 tablets by mouth every 4 (four) hours as needed. 30 tablet 0  . predniSONE (DELTASONE) 10 MG tablet Take 10 mg by mouth daily with breakfast.    . ranitidine (ZANTAC) 300 MG capsule Take 300 mg by mouth every evening.    . traMADol (ULTRAM) 50 MG tablet Take 50 mg by mouth every 6 (six) hours as needed for pain.    . traZODone (DESYREL) 50 MG tablet Take 50 mg by mouth at bedtime.    . Vitamin D, Ergocalciferol, (DRISDOL) 50000 UNITS CAPS capsule Take 50,000 Units by mouth every Sunday.     . fluticasone (FLOVENT DISKUS) 50 MCG/BLIST diskus inhaler Inhale 1 puff into the lungs 2 (two) times daily. (Patient not taking: Reported on 08/10/2020)    . ipratropium-albuterol (DUONEB) 0.5-2.5 (3) MG/3ML SOLN Take 3 mLs by nebulization every 4 (four) hours as needed. (Patient not taking: Reported on 08/10/2020) 360 mL 3  . potassium chloride SA (KLOR-CON) 20 MEQ tablet  Take 1 tablet (20 mEq total) by mouth 2 (two) times daily for 7 days. 14 tablet 0   No current facility-administered medications on file prior to visit.    Allergies  Allergen Reactions  . Aspirin   .  Influenza Vaccines     Family History  Problem Relation Age of Onset  . Cancer Father     BP 132/70   Pulse 79   Ht 5' 3.5" (1.613 m)   Wt 151 lb (68.5 kg)   SpO2 94%   BMI 26.33 kg/m    Review of Systems denies weight loss and excessive diaphoresis.  She has fatigue and intermitt palpitations, anxiety, and tremor.  She has chronic sob.  She has alternating heat and cold intolerance.    Objective:   Physical Exam VITAL SIGNS:  See vs page GENERAL: no distress.  Has 02 on.  NECK: There is no palpable thyroid enlargement.  No thyroid nodule is palpable.  No palpable lymphadenopathy at the anterior neck.    outside test results are reviewed: TSH is undetectable.   CT: Low-attenuation thyroid nodules measure up to 1.7 cm on the right.   I have reviewed outside records, and summarized:  Pt was noted to have , and referred here.  She had CT to f/u pulm nodules, and thyroid nodule was incidentally noted.      Assessment & Plan:  Nodular goiter, new.  Hyperthyroidism, prob due to the goiter.  She cannot be isolated for RAI rx.    Patient Instructions  Let's check the ultrasound, and nuclear medicine test.  you will receive a phone call, about a days and times for appointments. Then i'll prescribe for you a pill to slow the thyroid. Please come back for a follow-up appointment in 2 months.       Hyperthyroidism  Hyperthyroidism is when the thyroid gland is too active (overactive). The thyroid gland is a small gland located in the lower front part of the neck, just in front of the windpipe (trachea). This gland makes hormones that help control how the body uses food for energy (metabolism) as well as how the heart and brain function. These hormones also play a role in  keeping your bones strong. When the thyroid is overactive, it produces too much of a hormone called thyroxine. What are the causes? This condition may be caused by:  Graves' disease. This is a disorder in which the body's disease-fighting system (immune system) attacks the thyroid gland. This is the most common cause.  Inflammation of the thyroid gland.  A tumor in the thyroid gland.  Use of certain medicines, including: ? Prescription thyroid hormone replacement. ? Herbal supplements that mimic thyroid hormones. ? Amiodarone therapy.  Solid or fluid-filled lumps within your thyroid gland (thyroid nodules).  Taking in a large amount of iodine from foods or medicines. What increases the risk? You are more likely to develop this condition if:  You are female.  You have a family history of thyroid conditions.  You smoke tobacco.  You use a medicine called lithium.  You take medicines that affect the immune system (immunosuppressants). What are the signs or symptoms? Symptoms of this condition include:  Nervousness.  Inability to tolerate heat.  Unexplained weight loss.  Diarrhea.  Change in the texture of hair or skin.  Heart skipping beats or making extra beats.  Rapid heart rate.  Loss of menstruation.  Shaky hands.  Fatigue.  Restlessness.  Sleep problems.  Enlarged thyroid gland or a lump in the thyroid (nodule). You may also have symptoms of Graves' disease, which may include:  Protruding eyes.  Dry eyes.  Red or swollen eyes.  Problems with vision. How is this diagnosed? This condition may be diagnosed based on:  Your symptoms and medical history.  A physical exam.  Blood tests.  Thyroid ultrasound. This test involves using sound waves to produce images of the thyroid gland.  A thyroid scan. A radioactive substance is injected into a vein, and images show how much iodine is present in the thyroid.  Radioactive iodine uptake test  (RAIU). A small amount of radioactive iodine is given by mouth to see how much iodine the thyroid absorbs after a certain amount of time. How is this treated? Treatment depends on the cause and severity of the condition. Treatment may include:  Medicines to reduce the amount of thyroid hormone your body makes.  Radioactive iodine treatment (radioiodine therapy). This involves swallowing a small dose of radioactive iodine, in capsule or liquid form, to kill thyroid cells.  Surgery to remove part or all of your thyroid gland. You may need to take thyroid hormone replacement medicine for the rest of your life after thyroid surgery.  Medicines to help manage your symptoms. Follow these instructions at home:   Take over-the-counter and prescription medicines only as told by your health care provider.  Do not use any products that contain nicotine or tobacco, such as cigarettes and e-cigarettes. If you need help quitting, ask your health care provider.  Follow any instructions from your health care provider about diet. You may be instructed to limit foods that contain iodine.  Keep all follow-up visits as told by your health care provider. This is important. ? You will need to have blood tests regularly so that your health care provider can monitor your condition. Contact a health care provider if:  Your symptoms do not get better with treatment.  You have a fever.  You are taking thyroid hormone replacement medicine and you: ? Have symptoms of depression. ? Feel like you are tired all the time. ? Gain weight. Get help right away if:  You have chest pain.  You have decreased alertness or a change in your awareness.  You have abdominal pain.  You feel dizzy.  You have a rapid heartbeat.  You have an irregular heartbeat.  You have difficulty breathing. Summary  The thyroid gland is a small gland located in the lower front part of the neck, just in front of the windpipe  (trachea).  Hyperthyroidism is when the thyroid gland is too active (overactive) and produces too much of a hormone called thyroxine.  The most common cause is Graves' disease, a disorder in which your immune system attacks the thyroid gland.  Hyperthyroidism can cause various symptoms, such as unexplained weight loss, nervousness, inability to tolerate heat, or changes in your heartbeat.  Treatment may include medicine to reduce the amount of thyroid hormone your body makes, radioiodine therapy, surgery, or medicines to manage symptoms. This information is not intended to replace advice given to you by your health care provider. Make sure you discuss any questions you have with your health care provider. Document Revised: 11/02/2017 Document Reviewed: 10/31/2017 Elsevier Patient Education  2020 [employer].

## 2020-08-18 ENCOUNTER — Other Ambulatory Visit: Payer: Self-pay | Admitting: Endocrinology

## 2020-08-18 DIAGNOSIS — E0590 Thyrotoxicosis, unspecified without thyrotoxic crisis or storm: Secondary | ICD-10-CM

## 2020-08-23 ENCOUNTER — Ambulatory Visit (HOSPITAL_COMMUNITY): Admission: RE | Admit: 2020-08-23 | Payer: Medicare Other | Source: Ambulatory Visit

## 2020-09-01 ENCOUNTER — Encounter (HOSPITAL_COMMUNITY): Payer: Medicare Other

## 2020-09-01 ENCOUNTER — Encounter (HOSPITAL_COMMUNITY): Admission: RE | Admit: 2020-09-01 | Payer: Medicare Other | Source: Ambulatory Visit

## 2020-09-02 ENCOUNTER — Encounter (HOSPITAL_COMMUNITY): Payer: Medicare Other

## 2020-09-02 ENCOUNTER — Telehealth: Payer: Self-pay

## 2020-09-02 NOTE — Telephone Encounter (Signed)
I don't know the reason for the cancellation.  Please reschedule.

## 2020-09-02 NOTE — Telephone Encounter (Signed)
-----   Message from Octavio Manns sent at 09/01/2020  9:19 AM EDT ----- Back on 9/16 I scheduled pt for her uptake and left a vm for her to call back. I have been out of the office since Monday 9/20, so I called her today.  She was scheduled for today and was unable to go.  She was talking about not being able to do radiation treatments till after 10/7. She was also talking about she just got a letter yesterday. I didn't see a letter in her chart.  I'm not quite sure what she was talking about, mostly because I don't work there and don't really know what all this means.   Can someone please give her a call to discuss.  Thank you so much Cecille Rubin

## 2020-09-02 NOTE — Telephone Encounter (Signed)
Spoke with patient.  Unsure of request.  Will follow up with Dr Loanne Drilling.

## 2020-10-19 ENCOUNTER — Other Ambulatory Visit: Payer: Self-pay

## 2020-10-19 ENCOUNTER — Encounter: Payer: Self-pay | Admitting: Endocrinology

## 2020-10-19 ENCOUNTER — Ambulatory Visit (INDEPENDENT_AMBULATORY_CARE_PROVIDER_SITE_OTHER): Payer: Medicare Other | Admitting: Endocrinology

## 2020-10-19 VITALS — BP 130/72 | HR 70 | Ht 63.5 in | Wt 153.5 lb

## 2020-10-19 DIAGNOSIS — E0590 Thyrotoxicosis, unspecified without thyrotoxic crisis or storm: Secondary | ICD-10-CM | POA: Diagnosis not present

## 2020-10-19 LAB — T4, FREE: Free T4: 0.81 ng/dL (ref 0.60–1.60)

## 2020-10-19 LAB — TSH: TSH: <0.01 u[IU]/mL — ABNORMAL LOW (ref 0.35–4.50)

## 2020-10-19 NOTE — Patient Instructions (Addendum)
Blood tests are requested for you today.  We'll let you know about the results.  Let's check the ultrasound, and nuclear medicine test.  you will receive a phone call, about a days and times for appointments. Then i'll prescribe for you a pill to slow the thyroid. Please come back for a follow-up appointment in 2 months.

## 2020-10-19 NOTE — Progress Notes (Signed)
Subjective:    Patient ID: Jill Spence, female    DOB: Nov 24, 1951, 69 y.o.   MRN: [id number]  HPI Pt returns for f/u of hyperthyroidism (dx'ed 2021; she has never been on therapy for this; she has never had dedicated thyroid imaging, but CT showed MNG; She cannot be isolated for RAI; pt lives alone, but has Water quality scientist).  Main symptom is pain at the mid-upper back.   Past Medical History:  Diagnosis Date  . Anemia    low iron  . Anxiety   . Arthritis   . Bipolar disorder ([place])   . COPD (chronic obstructive pulmonary disease) ([place])   . Family history of adverse reaction to anesthesia    mom "was put to sleep and she never woke up" -   . GERD (gastroesophageal reflux disease)   . Guillain Barr syndrome ([facility])    numbness in toes, legs hurt  . Headache    migraines as a teenager  . IBS (irritable bowel syndrome)   . Lump, breast    removed years ago per Pt.  . Pneumonia   . Restless leg syndrome   . Seizures ([place])    only has one when she gets upset, has "quiet' seizures    Past Surgical History:  Procedure Laterality Date  . BREAST SURGERY    . COLONOSCOPY    . EYE SURGERY Bilateral    cataract surgery with lens implants  . MANDIBLE RECONSTRUCTION    . MINOR REMOVAL OF MANDIBULAR HARDWARE Right 10/15/2015   Procedure: MINOR REMOVAL OF Right MANDIBULAR HARDWARE;  Surgeon: Diona Browner, DDS;  Location: [facility];  Service: Oral Surgery;  Laterality: Right;  . TONSILLECTOMY      Social History   Socioeconomic History  . Marital status: Widowed    Spouse name: Not on file  . Number of children: Not on file  . Years of education: Not on file  . Highest education level: Not on file  Occupational History  . Not on file  Tobacco Use  . Smoking status: Former Smoker    Packs/day: 0.25    Years: 6.00    Pack years: 1.50    Types: Cigarettes    Quit date: 10/14/2007    Years since quitting: 13.0  . Smokeless tobacco: Never Used  Substance and Sexual Activity   . Alcohol use: No  . Drug use: No  . Sexual activity: Not on file  Other Topics Concern  . Not on file  Social History Narrative  . Not on file   Social Determinants of Health   Financial Resource Strain:   . Difficulty of Paying Living Expenses: Not on file  Food Insecurity:   . Worried About Charity fundraiser in the Last Year: Not on file  . Ran Out of Food in the Last Year: Not on file  Transportation Needs:   . Lack of Transportation (Medical): Not on file  . Lack of Transportation (Non-Medical): Not on file  Physical Activity:   . Days of Exercise per Week: Not on file  . Minutes of Exercise per Session: Not on file  Stress:   . Feeling of Stress : Not on file  Social Connections:   . Frequency of Communication with Friends and Family: Not on file  . Frequency of Social Gatherings with Friends and Family: Not on file  . Attends Religious Services: Not on file  . Active Member of Clubs or Organizations: Not on file  . Attends Club  or Organization Meetings: Not on file  . Marital Status: Not on file  Intimate Partner Violence:   . Fear of Current or Ex-Partner: Not on file  . Emotionally Abused: Not on file  . Physically Abused: Not on file  . Sexually Abused: Not on file    Current Outpatient Medications on File Prior to Visit  Medication Sig Dispense Refill  . albuterol (PROVENTIL HFA;VENTOLIN HFA) 108 (90 BASE) MCG/ACT inhaler Inhale 2 puffs into the lungs every 6 (six) hours as needed for wheezing.    Marland Kitchen albuterol (PROVENTIL) (2.5 MG/3ML) 0.083% nebulizer solution Take 2.5 mg by nebulization every 6 (six) hours as needed for wheezing.    Marland Kitchen alendronate (FOSAMAX) 70 MG tablet Take 70 mg by mouth every Sunday. Take with a full glass of water on an empty stomach.    . ARIPiprazole (ABILIFY) 15 MG tablet Take 15 mg by mouth daily.    . benzonatate (TESSALON) 200 MG capsule Take 200 mg by mouth 3 (three) times daily as needed for cough.    . benztropine (COGENTIN) 0.5  MG tablet Take 0.5 mg by mouth 2 (two) times daily.    . cetirizine (ZYRTEC) 10 MG tablet Take 10 mg by mouth daily.    . famciclovir (FAMVIR) 250 MG tablet Take 250 mg by mouth 2 (two) times daily.    . fluticasone (FLOVENT DISKUS) 50 MCG/BLIST diskus inhaler Inhale 1 puff into the lungs 2 (two) times daily.     . Fluticasone-Salmeterol (ADVAIR) 500-50 MCG/DOSE AEPB Inhale 1 puff into the lungs every 12 (twelve) hours.    . Fluticasone-Umeclidin-Vilant (TRELEGY ELLIPTA) 100-62.5-25 MCG/INH AEPB Inhale into the lungs.    . gabapentin (NEURONTIN) 300 MG capsule Take 600 mg by mouth 2 (two) times daily.    Marland Kitchen ipratropium-albuterol (DUONEB) 0.5-2.5 (3) MG/3ML SOLN Take 3 mLs by nebulization every 4 (four) hours as needed. 360 mL 3  . levofloxacin (LEVAQUIN) 750 MG tablet Take 750 mg by mouth daily.    . magnesium oxide (MAG-OX) 400 MG tablet Take 250 mg by mouth daily.    Marland Kitchen omeprazole (PRILOSEC) 40 MG capsule Take 40 mg by mouth daily.    Marland Kitchen oxcarbazepine (TRILEPTAL) 600 MG tablet Take 600 mg by mouth 2 (two) times daily.    Marland Kitchen oxyCODONE-acetaminophen (PERCOCET) 5-325 MG tablet Take 1-2 tablets by mouth every 4 (four) hours as needed. 30 tablet 0  . predniSONE (DELTASONE) 10 MG tablet Take 10 mg by mouth daily with breakfast.    . ranitidine (ZANTAC) 300 MG capsule Take 300 mg by mouth every evening.    . traMADol (ULTRAM) 50 MG tablet Take 50 mg by mouth every 6 (six) hours as needed for pain.    . traZODone (DESYREL) 50 MG tablet Take 50 mg by mouth at bedtime.    . Vitamin D, Ergocalciferol, (DRISDOL) 50000 UNITS CAPS capsule Take 50,000 Units by mouth every Sunday.     . potassium chloride SA (KLOR-CON) 20 MEQ tablet Take 1 tablet (20 mEq total) by mouth 2 (two) times daily for 7 days. 14 tablet 0   No current facility-administered medications on file prior to visit.    Allergies  Allergen Reactions  . Aspirin   . Influenza Vaccines     Family History  Problem Relation Age of Onset  .  Cancer Father     BP 130/72   Pulse 70   Ht 5' 3.5" (1.613 m)   Wt 153 lb 8 oz (69.6 kg)  SpO2 94%   BMI 26.76 kg/m    Review of Systems     Objective:   Physical Exam VITAL SIGNS:  See vs page GENERAL: no distress NECK: There is no palpable thyroid enlargement.  No thyroid nodule is palpable.  No palpable lymphadenopathy at the anterior neck.  Lab Results  Component Value Date   TSH <0.01 Repeated and verified X2. (L) 10/19/2020       Assessment & Plan:  MNG.  Hyperthyroidism, prob due to the above.  Uncontrolled.     Patient Instructions  Blood tests are requested for you today.  We'll let you know about the results.  Let's check the ultrasound, and nuclear medicine test.  you will receive a phone call, about a days and times for appointments. Then i'll prescribe for you a pill to slow the thyroid. Please come back for a follow-up appointment in 2 months.

## 2020-10-20 ENCOUNTER — Ambulatory Visit (HOSPITAL_BASED_OUTPATIENT_CLINIC_OR_DEPARTMENT_OTHER)
Admission: RE | Admit: 2020-10-20 | Discharge: 2020-10-20 | Disposition: A | Discharge status: Home / Self Care | Payer: Medicare Other | Source: Ambulatory Visit | Attending: Pulmonary Disease | Admitting: Pulmonary Disease

## 2020-10-20 DIAGNOSIS — R911 Solitary pulmonary nodule: Secondary | ICD-10-CM | POA: Insufficient documentation

## 2020-10-25 ENCOUNTER — Other Ambulatory Visit: Payer: Self-pay | Admitting: Pulmonary Disease

## 2020-10-25 DIAGNOSIS — R911 Solitary pulmonary nodule: Secondary | ICD-10-CM

## 2020-10-26 ENCOUNTER — Other Ambulatory Visit: Payer: Self-pay

## 2020-10-26 ENCOUNTER — Ambulatory Visit (HOSPITAL_BASED_OUTPATIENT_CLINIC_OR_DEPARTMENT_OTHER)
Admission: RE | Admit: 2020-10-26 | Discharge: 2020-10-26 | Disposition: A | Discharge status: Home / Self Care | Payer: Medicare Other | Source: Ambulatory Visit | Attending: Endocrinology | Admitting: Endocrinology

## 2020-10-26 DIAGNOSIS — E0590 Thyrotoxicosis, unspecified without thyrotoxic crisis or storm: Secondary | ICD-10-CM | POA: Insufficient documentation

## 2020-11-04 ENCOUNTER — Other Ambulatory Visit: Payer: Medicare Other

## 2020-11-11 ENCOUNTER — Other Ambulatory Visit: Payer: Self-pay

## 2020-11-11 ENCOUNTER — Ambulatory Visit (INDEPENDENT_AMBULATORY_CARE_PROVIDER_SITE_OTHER): Payer: Medicare Other | Admitting: Pulmonary Disease

## 2020-11-11 ENCOUNTER — Encounter: Payer: Self-pay | Admitting: Pulmonary Disease

## 2020-11-11 VITALS — BP 120/90 | HR 84 | Temp 98.1°F | Ht 63.0 in | Wt 158.0 lb

## 2020-11-11 DIAGNOSIS — J449 Chronic obstructive pulmonary disease, unspecified: Secondary | ICD-10-CM | POA: Diagnosis not present

## 2020-11-11 DIAGNOSIS — R911 Solitary pulmonary nodule: Secondary | ICD-10-CM | POA: Diagnosis not present

## 2020-11-11 DIAGNOSIS — J439 Emphysema, unspecified: Secondary | ICD-10-CM

## 2020-11-11 LAB — SARS-COV-2 IGG: SARS-COV-2 IgG: 29.51

## 2020-11-11 NOTE — Patient Instructions (Addendum)
I have reviewed your CT scan which shows stable lung nodules Continue the Trelegy inhaler.  You do not need to use the breo with that Continue Mucinex and flutter valve for clearance of secretions. We will reschedule pulmonary function tests  Follow-up in 1 to 2 months.

## 2020-11-11 NOTE — Addendum Note (Signed)
Addended by: Vanessa Barbara on: 11/11/2020 12:16 PM   Modules accepted: Orders

## 2020-11-11 NOTE — Addendum Note (Signed)
Addended by: Vanessa Barbara on: 11/11/2020 12:15 PM   Modules accepted: Orders

## 2020-11-11 NOTE — Progress Notes (Signed)
Jill Spence    [id number]    1951/09/21  Primary Care Physician:Patient, No Pcp Per  Referring Physician: No referring provider defined for this encounter.  Chief complaint: Follow-up for COPD  HPI: 69 year old with history of COPD on supplemental oxygen, lung nodules.  Previously followed at [facility]. Maintained on [place] and now Trelegy inhaler.  She has had recurrent exacerbations over the past year requiring prednisone, antibiotics. She is also being followed with serial CT scans for lung nodule  States that she was hospitalized for COVID-19 at [facility] regional in early 2021 but I do not have any record of those.  Chief complaint is dyspnea on exertion, chronic cough with mucus production.  Pets: Has a dog Occupation: Retired Recruitment consultant Exposures: Reports mold in the previous apartment.  No current mold exposure.  No hot tub, Jacuzzi Smoking history: States that she quit smoking in 2008.  Cannot give me a clear idea of how much she smoked prior Travel history: No significant travel history Relevant family history: No significant family history of lung disease  Interim history: Recently diagnosed with multinodular goiter and hypothyroidism.  She is following with Dr. Loanne Drilling with radiotherapy plan.  Continues to have dyspnea on exertion, chest congestion Reports anxiety.  Outpatient Encounter Medications as of 11/11/2020  Medication Sig  . albuterol (PROVENTIL HFA;VENTOLIN HFA) 108 (90 BASE) MCG/ACT inhaler Inhale 2 puffs into the lungs every 6 (six) hours as needed for wheezing.  Marland Kitchen albuterol (PROVENTIL) (2.5 MG/3ML) 0.083% nebulizer solution Take 2.5 mg by nebulization every 6 (six) hours as needed for wheezing.  Marland Kitchen alendronate (FOSAMAX) 70 MG tablet Take 70 mg by mouth every Sunday. Take with a full glass of water on an empty stomach.  . ARIPiprazole (ABILIFY) 15 MG tablet Take 15 mg by mouth daily.  . benzonatate (TESSALON) 200 MG capsule Take 200 mg by  mouth 3 (three) times daily as needed for cough.  . benztropine (COGENTIN) 0.5 MG tablet Take 0.5 mg by mouth 2 (two) times daily.  . cetirizine (ZYRTEC) 10 MG tablet Take 10 mg by mouth daily.  . famciclovir (FAMVIR) 250 MG tablet Take 250 mg by mouth 2 (two) times daily.  . fluticasone (FLOVENT DISKUS) 50 MCG/BLIST diskus inhaler Inhale 1 puff into the lungs 2 (two) times daily.   . Fluticasone-Salmeterol (ADVAIR) 500-50 MCG/DOSE AEPB Inhale 1 puff into the lungs every 12 (twelve) hours.  . Fluticasone-Umeclidin-Vilant (TRELEGY ELLIPTA) 100-62.5-25 MCG/INH AEPB Inhale into the lungs.  . gabapentin (NEURONTIN) 300 MG capsule Take 600 mg by mouth 2 (two) times daily.  Marland Kitchen ipratropium-albuterol (DUONEB) 0.5-2.5 (3) MG/3ML SOLN Take 3 mLs by nebulization every 4 (four) hours as needed.  Marland Kitchen levofloxacin (LEVAQUIN) 750 MG tablet Take 750 mg by mouth daily.  . magnesium oxide (MAG-OX) 400 MG tablet Take 250 mg by mouth daily.  Marland Kitchen omeprazole (PRILOSEC) 40 MG capsule Take 40 mg by mouth daily.  Marland Kitchen oxcarbazepine (TRILEPTAL) 600 MG tablet Take 600 mg by mouth 2 (two) times daily.  Marland Kitchen oxyCODONE-acetaminophen (PERCOCET) 5-325 MG tablet Take 1-2 tablets by mouth every 4 (four) hours as needed.  . predniSONE (DELTASONE) 10 MG tablet Take 10 mg by mouth daily with breakfast.  . ranitidine (ZANTAC) 300 MG capsule Take 300 mg by mouth every evening.  . traMADol (ULTRAM) 50 MG tablet Take 50 mg by mouth every 6 (six) hours as needed for pain.  . traZODone (DESYREL) 50 MG tablet Take 50 mg  by mouth at bedtime.  . Vitamin D, Ergocalciferol, (DRISDOL) 50000 UNITS CAPS capsule Take 50,000 Units by mouth every Sunday.   . potassium chloride SA (KLOR-CON) 20 MEQ tablet Take 1 tablet (20 mEq total) by mouth 2 (two) times daily for 7 days.   No facility-administered encounter medications on file as of 11/11/2020.   Physical Exam: Blood pressure 120/90, pulse 84, temperature 98.1 F (36.7 C), temperature source Temporal,  height 5\' 3"  (1.6 m), weight 158 lb (71.7 kg), SpO2 95 %. Gen:      No acute distress HEENT:  EOMI, sclera anicteric Neck:     No masses; no thyromegaly Lungs:    Clear to auscultation bilaterally; normal respiratory effort CV:         Regular rate and rhythm; no murmurs Abd:      + bowel sounds; soft, non-tender; no palpable masses, no distension Ext:    No edema; adequate peripheral perfusion Skin:      Warm and dry; no rash Neuro: alert and oriented x 3 Psych: normal mood and affect  Data Reviewed: Imaging: CT chest 03/29/2020-cirrhosis, thyroid nodule, severe emphysema  A new 4 mm nodule is seen along the minor fissure (3/61). 6 mm nodule in the lateral  segment right middle lobe (3/69) has enlarged from 3 mm on  01/01/2019. 2 mm anterior right lower lobe nodule (3/76) is new.  Scarring in the apex of the left upper lobe with a new nodular  component measuring 6 mm (3/12). 3 mm lingular nodule (3/63) is new.  Additional tiny pulmonary nodules in the left lung are stable. No  pleural fluid. Airway is unremarkable.   CT chest 07/14/2020-new 5 mm nodule in the right middle lobe.  Remainder of the lung nodules are stable.  Enlarged pulmonary trunk  CT chest 10/20/2020-stable right middle lobe nodule, thyroid nodule, aortic atherosclerosis, emphysema I have reviewed the images personally.  Labs: CBC 07/27/2020-WBC 7.3, eos 1%, absolute eosinophil count 73 IgE 07/27/2020-5  Alpha-1 antitrypsin 08/28/2028-170, PIMM  Assessment:  Severe COPD with exacerbations Currently on Trelegy inhaler. Duo nebs ordered at last visit but insurance will not cover until beginning of next year.  Mucinex, flutter valve for mucociliary clearance She is continuing with physical therapy.  Declined pulmonary rehab.  Pulmonary nodules Follow-up CT in 6 months ordered  Plan/Recommendations: Continue Trelegy Follow-up CT in 6 months   Marshell Garfinkel MD [facility] 11/11/2020,  11:45 AM  CC: No ref. provider found

## 2020-11-18 ENCOUNTER — Encounter: Payer: Self-pay | Admitting: *Deleted

## 2020-11-23 ENCOUNTER — Telehealth: Payer: Self-pay | Admitting: Pulmonary Disease

## 2020-11-23 NOTE — Telephone Encounter (Signed)
Attempted to call pt but unable to reach. Left message for her to return call. 

## 2020-11-24 NOTE — Telephone Encounter (Signed)
Called and spoke to pt. Informed her of the results and recs per PM. Pt verbalized understanding and denied any further questions or concerns at this time.    Covid results per Dr. Vaughan Browner: Covid antibodies are elevated indicating either prior infection or vaccination to Covid.

## 2020-12-02 ENCOUNTER — Telehealth: Payer: Self-pay | Admitting: Endocrinology

## 2020-12-02 NOTE — Telephone Encounter (Signed)
[facility] called stating they do not perform the Nuclear Medicine Thyroid Multiple Uptake Scans there, and we would need to refer the patient to the Hospital to do that.

## 2020-12-02 NOTE — Telephone Encounter (Signed)
Please disregard this telephone call. Pt is scheduled at [facility].

## 2020-12-09 ENCOUNTER — Telehealth: Payer: Self-pay | Admitting: Endocrinology

## 2020-12-09 NOTE — Telephone Encounter (Signed)
Please advise 

## 2020-12-09 NOTE — Telephone Encounter (Signed)
Yes, please keep the appt

## 2020-12-09 NOTE — Telephone Encounter (Signed)
Notified pt to keep the appt to see Dr. Everardo All on 12-21-20

## 2020-12-09 NOTE — Telephone Encounter (Signed)
Patient's caregiver Byrd Hesselbach requests to be called at ph# [phone] re: Verify that patient needs to attend her appointment scheduled with Dr. Everardo All on 12/21/20 at 1:45 pm even though Patient has not done the Nuclear Medicine treatment which is scheduled for 01/12/21.

## 2020-12-17 ENCOUNTER — Other Ambulatory Visit: Payer: Self-pay

## 2020-12-21 ENCOUNTER — Ambulatory Visit: Payer: Medicare Other | Admitting: Endocrinology

## 2020-12-28 ENCOUNTER — Ambulatory Visit: Payer: 59

## 2020-12-28 ENCOUNTER — Other Ambulatory Visit: Payer: Self-pay

## 2020-12-28 ENCOUNTER — Encounter: Payer: Self-pay | Admitting: Pulmonary Disease

## 2020-12-28 ENCOUNTER — Ambulatory Visit (INDEPENDENT_AMBULATORY_CARE_PROVIDER_SITE_OTHER): Payer: 59 | Admitting: Pulmonary Disease

## 2020-12-28 VITALS — BP 112/78 | HR 77

## 2020-12-28 DIAGNOSIS — J449 Chronic obstructive pulmonary disease, unspecified: Secondary | ICD-10-CM

## 2020-12-28 DIAGNOSIS — R911 Solitary pulmonary nodule: Secondary | ICD-10-CM | POA: Diagnosis not present

## 2020-12-28 DIAGNOSIS — J439 Emphysema, unspecified: Secondary | ICD-10-CM

## 2020-12-28 MED ORDER — IPRATROPIUM-ALBUTEROL 0.5-2.5 (3) MG/3ML IN SOLN
3.0000 mL | RESPIRATORY_TRACT | 5 refills | Status: DC | PRN
Start: 2020-12-28 — End: 2023-08-14

## 2020-12-28 NOTE — Addendum Note (Signed)
Addended by: Elton Sin on: 12/28/2020 12:31 PM   Modules accepted: Orders

## 2020-12-28 NOTE — Patient Instructions (Addendum)
Will order a nebulizer and duo nebs Continue Trelegy inhaler  We will make sure that the CT chest without contrast was ordered for May 2022 for follow-up of lung nodules  Follow-up in 6 months with telephone visit

## 2020-12-28 NOTE — Progress Notes (Signed)
Jill Spence    [id number]    03-Oct-1951  [facility], Malva Limes., FNP  Referring Physician: No referring provider defined for this encounter.  Chief complaint: Follow-up for COPD  HPI: 70 year old with history of COPD on supplemental oxygen, lung nodules.  Previously followed at [facility]. Maintained on [place] and now Trelegy inhaler.  She has had recurrent exacerbations over the past year requiring prednisone, antibiotics. She is also being followed with serial CT scans for lung nodule  States that she was hospitalized for COVID-19 at [facility] in early 2021 but I do not have any record of those.  Chief complaint is dyspnea on exertion, chronic cough with mucus production.  Pets: Has a dog Occupation: Retired Recruitment consultant Exposures: Reports mold in the previous apartment.  No current mold exposure.  No hot tub, Jacuzzi Smoking history: States that she quit smoking in 2008.  Cannot give me a clear idea of how much she smoked prior Travel history: No significant travel history Relevant family history: No significant family history of lung disease  Interim history: Recently diagnosed with multinodular goiter and hypothyroidism.  She is following with Dr. Loanne Drilling with radiotherapy plan.  Continues to have dyspnea on exertion, chest congestion, anxiety  Outpatient Encounter Medications as of 12/28/2020  Medication Sig  . albuterol (PROVENTIL HFA;VENTOLIN HFA) 108 (90 BASE) MCG/ACT inhaler Inhale 2 puffs into the lungs every 6 (six) hours as needed for wheezing.  Marland Kitchen albuterol (PROVENTIL) (2.5 MG/3ML) 0.083% nebulizer solution Take 2.5 mg by nebulization every 6 (six) hours as needed for wheezing.  Marland Kitchen alendronate (FOSAMAX) 70 MG tablet Take 70 mg by mouth every Sunday. Take with a full glass of water on an empty stomach.  . ARIPiprazole (ABILIFY) 15 MG tablet Take 15 mg by mouth daily.  . benzonatate (TESSALON) 200 MG capsule Take 200 mg by mouth  3 (three) times daily as needed for cough.  . benztropine (COGENTIN) 0.5 MG tablet Take 0.5 mg by mouth 2 (two) times daily.  . cetirizine (ZYRTEC) 10 MG tablet Take 10 mg by mouth daily.  . famciclovir (FAMVIR) 250 MG tablet Take 250 mg by mouth 2 (two) times daily.  . fluticasone (FLOVENT DISKUS) 50 MCG/BLIST diskus inhaler Inhale 1 puff into the lungs 2 (two) times daily.   . Fluticasone-Salmeterol (ADVAIR) 500-50 MCG/DOSE AEPB Inhale 1 puff into the lungs every 12 (twelve) hours.  . Fluticasone-Umeclidin-Vilant (TRELEGY ELLIPTA) 100-62.5-25 MCG/INH AEPB Inhale into the lungs.  . gabapentin (NEURONTIN) 300 MG capsule Take 600 mg by mouth 2 (two) times daily.  Marland Kitchen ipratropium-albuterol (DUONEB) 0.5-2.5 (3) MG/3ML SOLN Take 3 mLs by nebulization every 4 (four) hours as needed.  Marland Kitchen levofloxacin (LEVAQUIN) 750 MG tablet Take 750 mg by mouth daily.  . magnesium oxide (MAG-OX) 400 MG tablet Take 250 mg by mouth daily.  Marland Kitchen omeprazole (PRILOSEC) 40 MG capsule Take 40 mg by mouth daily.  Marland Kitchen oxcarbazepine (TRILEPTAL) 600 MG tablet Take 600 mg by mouth 2 (two) times daily.  Marland Kitchen oxyCODONE-acetaminophen (PERCOCET) 5-325 MG tablet Take 1-2 tablets by mouth every 4 (four) hours as needed.  . potassium chloride SA (KLOR-CON) 20 MEQ tablet Take 1 tablet (20 mEq total) by mouth 2 (two) times daily for 7 days.  . predniSONE (DELTASONE) 10 MG tablet Take 10 mg by mouth daily with breakfast.  . ranitidine (ZANTAC) 300 MG capsule Take 300 mg by mouth every evening.  . traMADol (ULTRAM) 50 MG  tablet Take 50 mg by mouth every 6 (six) hours as needed for pain.  . traZODone (DESYREL) 50 MG tablet Take 50 mg by mouth at bedtime.  . Vitamin D, Ergocalciferol, (DRISDOL) 50000 UNITS CAPS capsule Take 50,000 Units by mouth every Sunday.    No facility-administered encounter medications on file as of 12/28/2020.   Physical Exam: Blood pressure 112/78, pulse 77, SpO2 93 %. Gen:      No acute distress HEENT:  EOMI, sclera  anicteric Neck:     No masses; no thyromegaly Lungs:    Clear to auscultation bilaterally; normal respiratory effort CV:         Regular rate and rhythm; no murmurs Abd:      + bowel sounds; soft, non-tender; no palpable masses, no distension Ext:    No edema; adequate peripheral perfusion Skin:      Warm and dry; no rash Neuro: alert and oriented x 3 Psych: normal mood and affect  Data Reviewed: Imaging: CT chest 03/29/2020-cirrhosis, thyroid nodule, severe emphysema  A new 4 mm nodule is seen along the minor fissure (3/61). 6 mm nodule in the lateral  segment right middle lobe (3/69) has enlarged from 3 mm on  01/01/2019. 2 mm anterior right lower lobe nodule (3/76) is new.  Scarring in the apex of the left upper lobe with a new nodular  component measuring 6 mm (3/12). 3 mm lingular nodule (3/63) is new.  Additional tiny pulmonary nodules in the left lung are stable. No  pleural fluid. Airway is unremarkable.   CT chest 07/14/2020-new 5 mm nodule in the right middle lobe.  Remainder of the lung nodules are stable.  Enlarged pulmonary trunk  CT chest 10/20/2020-stable right middle lobe nodule, thyroid nodule, aortic atherosclerosis, emphysema I have reviewed the images personally.  Labs: CBC 07/27/2020-WBC 7.3, eos 1%, absolute eosinophil count 73 IgE 07/27/2020-5  Alpha-1 antitrypsin 08/28/2028-170, PIMM  Assessment:  Severe COPD with exacerbations Currently on Trelegy inhaler. She has new insurance now.  Will start duo nebs and order Nuplazid machine  Mucinex, flutter valve for mucociliary clearance She is continuing with physical therapy.  Declined pulmonary rehab.  Pulmonary nodules Follow-up CT in 6 months ordered  Plan/Recommendations: Continue Trelegy Follow-up CT in 6 months   Marshell Garfinkel MD [facility] 12/28/2020, 10:46 AM  CC: No ref. provider found

## 2021-01-14 ENCOUNTER — Other Ambulatory Visit: Payer: Self-pay

## 2021-01-17 ENCOUNTER — Telehealth: Payer: Self-pay | Admitting: Endocrinology

## 2021-01-17 NOTE — Telephone Encounter (Signed)
please contact patient: We got the nuc med scan result.  Next step is to have the [place].  Is that scheduled for you?

## 2021-01-17 NOTE — Telephone Encounter (Signed)
Called Jill Spence--tried to notified Jill Spence regarding instructions for the next step but Jill Spence is upset . Jill Spence stated --anytime have an appointment with Dr. Pollie Friar always not in the office feeling not trying to see the Jill Spence. Melissa talked to the Jill Spence--again very upset and using cursing language --not letting anyone to talk to her. Jill Spence requesting for via phone virtual tomorrow for the appt.

## 2021-01-18 ENCOUNTER — Other Ambulatory Visit: Payer: Self-pay

## 2021-01-18 ENCOUNTER — Telehealth (INDEPENDENT_AMBULATORY_CARE_PROVIDER_SITE_OTHER): Payer: 59 | Admitting: Endocrinology

## 2021-01-18 DIAGNOSIS — E0590 Thyrotoxicosis, unspecified without thyrotoxic crisis or storm: Secondary | ICD-10-CM

## 2021-01-18 MED ORDER — METHIMAZOLE 10 MG PO TABS: 10.0000 mg | ORAL_TABLET | Freq: Two times a day (BID) | ORAL | 11 refills | Status: DC

## 2021-01-18 NOTE — Patient Instructions (Signed)
I have sent a prescription to your pharmacy, to slow the thyroid. If ever you have fever while taking methimazole, stop it and call us, even if the reason is obvious, because of the risk of a rare side-effect. It is best to never miss the medication.  However, if you do miss it, next best is to double up the next time. Please come back for a follow-up appointment in 3 weeks.

## 2021-01-18 NOTE — Telephone Encounter (Signed)
OK with me.

## 2021-01-18 NOTE — Progress Notes (Addendum)
Subjective:    Patient ID: Jill Spence, female    DOB: 1951/06/25, 70 y.o.   MRN: [id number]  HPI telehealth visit today via telephone x 45 minutes.   Alternatives to telehealth are presented to this patient, and the patient agrees to the telehealth visit.   Pt is advised of the cost of the visit, and agrees to this, also.   Patient is at home, and I am at home.   Persons attending the telehealth visit: the patient, caretaker, and I.   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  She reports odynophagia, insomnia, hair loss, and generalized body pain.  pt reports sxs of palpitations, and insomnia, anxiety, decreased appetite, excessive thirst, and excessive diaphoresis.   She had labs on 01/13/21, at [facility].  US showed Dominant nodule in the left inferior thyroid measuring up to 2.9 cm (labeled 5) which meets criteria (TI-RADS category 4) for tissue sampling  nuc med scan showed hyperfunctioning nodule in the left thyroid lobe suppressing the rest of the gland.   Past Medical History:  Diagnosis Date  . Anemia    low iron  . Anxiety   . Arthritis   . Bipolar disorder ([place])   . COPD (chronic obstructive pulmonary disease) ([place])   . Family history of adverse reaction to anesthesia    mom "was put to sleep and she never woke up" -   . GERD (gastroesophageal reflux disease)   . Guillain Barr syndrome (H[facility])    numbness in toes, legs hurt  . Headache    migraines as a teenager  . IBS (irritable bowel syndrome)   . Lump, breast    removed years ago per Pt.  . Pneumonia   . Restless leg syndrome   . 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 BREAST SURGERY    . COLONOSCOPY    . EYE SURGERY Bilateral    cataract surgery with lens implants  . MANDIBLE  RECONSTRUCTION    . MINOR REMOVAL OF MANDIBULAR HARDWARE Right 10/15/2015   Procedure: MINOR REMOVAL OF Right MANDIBULAR HARDWARE;  Surgeon: Diona Browner, DDS;  Location: [facility];  Service: Oral Surgery;  Laterality: Right;  . TONSILLECTOMY      Social History   Socioeconomic History  . Marital status: Widowed    Spouse name: Not on file  . Number of children: Not on file  . Years of education: Not on file  . Highest education level: Not on file  Occupational History  . Not on file  Tobacco Use  . Smoking status: Former Smoker    Packs/day: 0.25    Years: 6.00    Pack years: 1.50    Types: Cigarettes    Quit date: 10/14/2007    Years since quitting: 13.2  . Smokeless tobacco: Never Used  Substance and Sexual Activity  . Alcohol use: No  . Drug use: No  . Sexual activity: Not on file  Other Topics Concern  . Not on file  Social History Narrative  . Not on file   Social Determinants of Health   Financial Resource Strain: Not on file  Food Insecurity: Not on file  Transportation Needs: Not on file  Physical Activity: Not on file  Stress: Not on file  Social Connections: Not on file  Intimate Partner Violence: Not on file    Current Outpatient Medications on File Prior to Visit  Medication Sig Dispense Refill  . albuterol (PROVENTIL HFA;VENTOLIN HFA) 108 (90 BASE) MCG/ACT inhaler Inhale 2 puffs into the lungs every 6 (six) hours as needed for wheezing.    Marland Kitchen albuterol (PROVENTIL) (2.5 MG/3ML) 0.083% nebulizer solution Take 2.5 mg by nebulization every 6 (six) hours as needed for wheezing.    Marland Kitchen alendronate (FOSAMAX) 70 MG tablet Take 70 mg by mouth every Sunday. Take with a full glass of water on an empty stomach.    . ARIPiprazole (ABILIFY) 15 MG tablet Take 15 mg by mouth daily.    . benzonatate (TESSALON) 200 MG capsule Take 200 mg by mouth 3 (three) times daily as needed for cough.    . benztropine (COGENTIN) 0.5 MG tablet Take 0.5 mg by mouth 2 (two) times daily.     . cetirizine (ZYRTEC) 10 MG tablet Take 10 mg by mouth daily.    . famciclovir (FAMVIR) 250 MG tablet Take 250 mg by mouth 2 (two) times daily.    . fluticasone (FLOVENT DISKUS) 50 MCG/BLIST diskus inhaler Inhale 1 puff into the lungs 2 (two) times daily.     . Fluticasone-Salmeterol (ADVAIR) 500-50 MCG/DOSE AEPB Inhale 1 puff into the lungs every 12 (twelve) hours.    . Fluticasone-Umeclidin-Vilant (TRELEGY ELLIPTA) 100-62.5-25 MCG/INH AEPB Inhale into the lungs.    . gabapentin (NEURONTIN) 300 MG capsule Take 600 mg by mouth 2 (two) times daily.    Marland Kitchen ipratropium-albuterol (DUONEB) 0.5-2.5 (3) MG/3ML SOLN Take 3 mLs by nebulization every 4 (four) hours as needed. 360 mL 5  . magnesium oxide (MAG-OX) 400 MG tablet Take 250 mg by mouth daily.    Marland Kitchen omeprazole (PRILOSEC) 40 MG capsule Take 40 mg by mouth daily.    Marland Kitchen oxcarbazepine (TRILEPTAL) 600 MG tablet Take 600 mg by mouth 2 (two) times daily.    Marland Kitchen oxyCODONE-acetaminophen (PERCOCET) 5-325 MG tablet Take 1-2 tablets by mouth every 4 (four) hours as needed. 30 tablet 0  . potassium chloride SA (KLOR-CON) 20 MEQ tablet Take 1 tablet (20 mEq total) by mouth 2 (two) times daily for 7 days. 14 tablet 0  . predniSONE (DELTASONE) 10 MG tablet Take 10 mg by mouth daily with breakfast.    . ranitidine (ZANTAC) 300 MG capsule Take 300 mg by mouth every evening.    . traMADol (ULTRAM) 50 MG tablet Take 50 mg by mouth every 6 (six) hours as needed for pain.    . traZODone (DESYREL) 50 MG tablet Take 50 mg by mouth at bedtime.    . Vitamin D, Ergocalciferol, (DRISDOL) 50000 UNITS CAPS capsule Take 50,000 Units by mouth every Sunday.      No current facility-administered medications on file prior to visit.    Allergies  Allergen Reactions  . Aspirin   . Influenza Vaccines     Family History  Problem Relation Age of Onset  . Cancer Father     There were no vitals taken for this visit.  Review of Systems     Objective:   Physical Exam   Lab  Results  Component Value Date   TSH <0.01 Repeated and verified X2. (L) 10/19/2020       Assessment & Plan:  MNG Hyperthyroidism, due to the above.  Due to the fact that the largest nodule is hyperfunctioning, it does not need  bx.  Patient Instructions  I have sent a prescription to your pharmacy, to slow the thyroid. If ever you have fever while taking methimazole, stop it and call us, even if the reason is obvious, because of the risk of a rare side-effect. It is best to never miss the medication.  However, if you do miss it, next best is to double up the next time. Please come back for a follow-up appointment in 3 weeks.

## 2021-01-20 ENCOUNTER — Telehealth: Payer: Self-pay | Admitting: Pulmonary Disease

## 2021-01-20 NOTE — Telephone Encounter (Signed)
I have called and LM on VM to make the pt aware that she will not be eligible for a new neb machine until march 2023

## 2021-01-24 ENCOUNTER — Other Ambulatory Visit (HOSPITAL_COMMUNITY)
Admission: RE | Admit: 2021-01-24 | Discharge: 2021-01-24 | Disposition: A | Discharge status: Home / Self Care | Payer: 59 | Source: Ambulatory Visit | Attending: Primary Care | Admitting: Primary Care

## 2021-01-24 DIAGNOSIS — Z20822 Contact with and (suspected) exposure to covid-19: Secondary | ICD-10-CM | POA: Diagnosis not present

## 2021-01-24 DIAGNOSIS — Z01812 Encounter for preprocedural laboratory examination: Secondary | ICD-10-CM | POA: Insufficient documentation

## 2021-01-24 LAB — SARS CORONAVIRUS 2 (TAT 6-24 HRS): SARS Coronavirus 2: NEGATIVE

## 2021-01-26 ENCOUNTER — Other Ambulatory Visit: Payer: Self-pay

## 2021-01-26 ENCOUNTER — Ambulatory Visit (INDEPENDENT_AMBULATORY_CARE_PROVIDER_SITE_OTHER): Payer: 59 | Admitting: Pulmonary Disease

## 2021-01-26 ENCOUNTER — Encounter: Payer: Self-pay | Admitting: Primary Care

## 2021-01-26 ENCOUNTER — Ambulatory Visit (INDEPENDENT_AMBULATORY_CARE_PROVIDER_SITE_OTHER): Payer: 59 | Admitting: Primary Care

## 2021-01-26 DIAGNOSIS — J9611 Chronic respiratory failure with hypoxia: Secondary | ICD-10-CM | POA: Diagnosis not present

## 2021-01-26 DIAGNOSIS — J439 Emphysema, unspecified: Secondary | ICD-10-CM

## 2021-01-26 DIAGNOSIS — J449 Chronic obstructive pulmonary disease, unspecified: Secondary | ICD-10-CM | POA: Insufficient documentation

## 2021-01-26 LAB — PULMONARY FUNCTION TEST
DL/VA % pred: 40 %
DL/VA: 1.7 ml/min/mmHg/L
DLCO cor % pred: 26 %
DLCO cor: 5.09 ml/min/mmHg
DLCO unc % pred: 26 %
DLCO unc: 5.09 ml/min/mmHg
FEF 25-75 Post: 0.27 L/sec
FEF 25-75 Pre: 0.37 L/sec
FEF2575-%Change-Post: -25 %
FEF2575-%Pred-Post: 16 %
FEF2575-%Pred-Pre: 22 %
FEV1-%Change-Post: -7 %
FEV1-%Pred-Post: 40 %
FEV1-%Pred-Pre: 44 %
FEV1-Post: 0.72 L
FEV1-Pre: 0.78 L
FEV1FVC-%Change-Post: 0 %
FEV1FVC-%Pred-Pre: 54 %
FEV6-%Change-Post: -10 %
FEV6-%Pred-Post: 72 %
FEV6-%Pred-Pre: 81 %
FEV6-Post: 1.59 L
FEV6-Pre: 1.78 L
FEV6FVC-%Change-Post: 0 %
FEV6FVC-%Pred-Post: 99 %
FEV6FVC-%Pred-Pre: 100 %
FVC-%Change-Post: -8 %
FVC-%Pred-Post: 73 %
FVC-%Pred-Pre: 80 %
FVC-Post: 1.68 L
FVC-Pre: 1.84 L
Post FEV1/FVC ratio: 43 %
Post FEV6/FVC ratio: 96 %
Pre FEV1/FVC ratio: 42 %
Pre FEV6/FVC Ratio: 97 %
RV % pred: 190 %
RV: 4.03 L
TLC % pred: 126 %
TLC: 6.19 L

## 2021-01-26 MED ORDER — DOXYCYCLINE HYCLATE 100 MG PO TABS: 100.0000 mg | ORAL_TABLET | Freq: Two times a day (BID) | ORAL | 0 refills | Status: DC

## 2021-01-26 MED ORDER — PREDNISONE 10 MG PO TABS: ORAL_TABLET | ORAL | 0 refills | Status: DC

## 2021-01-26 NOTE — Progress Notes (Signed)
PFT done today. 

## 2021-01-26 NOTE — Assessment & Plan Note (Signed)
-   Requires supplemental oxygen 3L continuously

## 2021-01-26 NOTE — Assessment & Plan Note (Addendum)
-   PFTs today showed severe obstructive airways disease without BD response. Very severe diffusion defect. Maintained on Trelegy 100 one puff daily; prn albuterol hfa or Duonebs q6. Breathing has worsened over time. She currently has a productive cough with thick mucus and shortness of breath with minimal exertion. Treating for AECOPD with prednisone taper and Doxycyline 100mg  BID x 7 days. Advised she use flutter valve 2-3 times a day. A letter was provided to help assist patient get a 3 bedroom apartment through section 8 housing. She lives alone and needs someone to stay with her at night. She is not vaccinated for covid or influenza. Due for CT chest in May 2022. She will follow-up with Dr. Vaughan Browner in July 2022.

## 2021-01-26 NOTE — Patient Instructions (Addendum)
Pulmonary function testing showed that you have stage 3 COPD, FEV1 40%. You did, however, use your trelegy before testing which could have impacted results  Recommendations:  - Continue Trelegy 100 one puff daily in the morning - Use Albuterol rescue inhaler 2 puffs every 6 hours OR nebulizer 2-3 times a day - Continue 3L oxygen at all times (recommend you wear it at night) - Due for repeat CT chest in May 2022 to monitor lung nodules   Follow-up - Dr. Vaughan Browner in July

## 2021-01-26 NOTE — Progress Notes (Signed)
@Patient  ID: Jill Spence, female    DOB: [birth year], 70 y.o.   MRN: [id number]  Chief Complaint  Patient presents with  . Follow-up    Pft results. 3L O2. Dme- [facility]    Referring provider: Sonia Side., FNP  HPI: 70 year old female, former light smoker quit in 2008 (1.5-pack-year history).  Past medical history significant for COPD with chronic bronchitis, lung nodules, hyperthyroidism. Patient of Dr. Vaughan Browner, last seen in office on 12/28/2020.  She is maintained on Trelegy 100 and supplemental oxygen.  Due for CT chest in May 2022.  Recommended follow-up in 6 months telephone visit.  01/26/2021- Interim hx  Patient presents today to review recent pulmonary function testing. She also is needed a letter for section 8 housing. Accompanied by caretaker. Breathing is severely limited d/t COPD. She has cough with thick clear mucus. She is scared because her breathing has worsened. She uses her albuterol occasionally. She is not due for nebulizer until 2023. She wears oxygen 3L continuously. She does not always wear it when she is in bed or at night. She has a lot of nasal congestin, unable to use nasal sprays. She is taking Singulair as prescribed. She takes mucinex liquid occasionally, she is unable to take tablets. She is on prednisone as needed. She is afraid to be alone at night because of her breathing, she oftens needs someone with her at night. Sometimes her nephew's wife will stay with her at night. She needs three bedroom and 2 bathroom apartment to accommodate her needs. She is not vaccinated for influenza or covid, states that in the past immunizations cause her to not be able to walk for a period of time. Due for repeat CT chest in May 2022 to monitor lung nodules.   Pulmonary testing 12/28/20 PFTs- FVC 1.68 (73%), FEV1 0.72 (40%), ratio 43, TLC 126%, DLCOunc 5.09 (26%)  Imaging: 10/21/20 CT chest- centrilobular emphysema, scarring left upper lobe. Right middle lobe nodules,  stable from 07/13/2020 but new or slightly enlarged from 01/01/2019. Additional follow-up CT chest without contrast in 6 months is recommended as malignancy cannot be excluded.    Allergies  Allergen Reactions  . Aspirin   . Influenza Vaccines      There is no immunization history on file for this patient.  Past Medical History:  Diagnosis Date  . Anemia    low iron  . Anxiety   . Arthritis   . Bipolar disorder ([place])   . COPD (chronic obstructive pulmonary disease) ([place])   . Family history of adverse reaction to anesthesia    mom "was put to sleep and she never woke up" -   . GERD (gastroesophageal reflux disease)   . Guillain Barr syndrome (HCC)    numbness in toes, legs hurt  . Headache    migraines as a teenager  . IBS (irritable bowel syndrome)   . Lump, breast    removed years ago per Pt.  . Pneumonia   . Restless leg syndrome   . 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 Packs/day: 0.25  . Years: 6.00  . Pack years: 1.50  . Types: Cigarettes  . Quit date: 10/14/2007  . Years since quitting: 13.2  Smokeless Tobacco Never Used   Counseling given: Not Answered   Outpatient Medications Prior to Visit  Medication Sig Dispense  Refill  . albuterol (PROVENTIL HFA;VENTOLIN HFA) 108 (90 BASE) MCG/ACT inhaler Inhale 2 puffs into the lungs every 6 (six) hours as needed for wheezing.    Marland Kitchen albuterol (PROVENTIL) (2.5 MG/3ML) 0.083% nebulizer solution Take 2.5 mg by nebulization every 6 (six) hours as needed for wheezing.    Marland Kitchen alendronate (FOSAMAX) 70 MG tablet Take 70 mg by mouth every Sunday. Take with a full glass of water on an empty stomach.    . ARIPiprazole (ABILIFY) 15 MG tablet Take 15 mg by mouth daily.    . benzonatate (TESSALON) 200 MG capsule Take 200 mg by mouth 3 (three) times daily as needed for cough.    . benztropine (COGENTIN) 0.5 MG  tablet Take 0.5 mg by mouth 2 (two) times daily.    . cetirizine (ZYRTEC) 10 MG tablet Take 10 mg by mouth daily.    . famciclovir (FAMVIR) 250 MG tablet Take 250 mg by mouth 2 (two) times daily.    . Fluticasone-Umeclidin-Vilant (TRELEGY ELLIPTA) 100-62.5-25 MCG/INH AEPB Inhale into the lungs.    . gabapentin (NEURONTIN) 300 MG capsule Take 600 mg by mouth 2 (two) times daily.    Marland Kitchen ipratropium-albuterol (DUONEB) 0.5-2.5 (3) MG/3ML SOLN Take 3 mLs by nebulization every 4 (four) hours as needed. 360 mL 5  . magnesium oxide (MAG-OX) 400 MG tablet Take 250 mg by mouth daily.    . methimazole (TAPAZOLE) 10 MG tablet Take 1 tablet (10 mg total) by mouth 2 (two) times daily. 180 tablet 11  . omeprazole (PRILOSEC) 40 MG capsule Take 40 mg by mouth daily.    Marland Kitchen oxcarbazepine (TRILEPTAL) 600 MG tablet Take 600 mg by mouth 2 (two) times daily.    Marland Kitchen oxyCODONE-acetaminophen (PERCOCET) 5-325 MG tablet Take 1-2 tablets by mouth every 4 (four) hours as needed. 30 tablet 0  . predniSONE (DELTASONE) 10 MG tablet Take 10 mg by mouth daily with breakfast.    . ranitidine (ZANTAC) 300 MG capsule Take 300 mg by mouth every evening.    . traMADol (ULTRAM) 50 MG tablet Take 50 mg by mouth every 6 (six) hours as needed for pain.    . traZODone (DESYREL) 50 MG tablet Take 50 mg by mouth at bedtime.    . Vitamin D, Ergocalciferol, (DRISDOL) 50000 UNITS CAPS capsule Take 50,000 Units by mouth every Sunday.     . fluticasone (FLOVENT DISKUS) 50 MCG/BLIST diskus inhaler Inhale 1 puff into the lungs 2 (two) times daily.     . Fluticasone-Salmeterol (ADVAIR) 500-50 MCG/DOSE AEPB Inhale 1 puff into the lungs every 12 (twelve) hours.    . potassium chloride SA (KLOR-CON) 20 MEQ tablet Take 1 tablet (20 mEq total) by mouth 2 (two) times daily for 7 days. 14 tablet 0   No facility-administered medications prior to visit.   Review of Systems  Review of Systems  Constitutional: Negative.   HENT: Positive for congestion and  postnasal drip.   Respiratory: Positive for cough and shortness of breath.   Cardiovascular: Positive for leg swelling.  Psychiatric/Behavioral: Negative.     Physical Exam  BP 120/82   Pulse 73   Temp 97.8 F (36.6 C)   Ht 5\' 5"  (1.651 m)   Wt 161 lb (73 kg)   SpO2 90%   BMI 26.79 kg/m  Physical Exam Constitutional:      General: She is not in acute distress.    Appearance: Normal appearance. She is not diaphoretic.     Comments: Chronically ill appearing  HENT:     Head: Normocephalic and atraumatic.     Mouth/Throat:     Comments: Deferred d/t masking Cardiovascular:     Rate and Rhythm: Normal rate and regular rhythm.     Comments: Trace edema, non-pitting Pulmonary:     Effort: Pulmonary effort is normal.     Breath sounds: No wheezing, rhonchi or rales.     Comments: Lungs mostly clear, diminished d/t poor effort; 3L oxygen  Musculoskeletal:     Comments: In wheelchair   Skin:    General: Skin is warm and dry.  Neurological:     General: No focal deficit present.     Mental Status: She is alert and oriented to person, place, and time. Mental status is at baseline.  Psychiatric:        Mood and Affect: Mood normal.        Behavior: Behavior normal.        Thought Content: Thought content normal.        Judgment: Judgment normal.      Lab Results:  CBC    Component Value Date/Time   WBC 7.3 07/27/2020 1510   RBC 4.39 07/27/2020 1510   HGB 13.7 07/27/2020 1510   HCT 41.1 07/27/2020 1510   PLT 281.0 07/27/2020 1510   MCV 93.6 07/27/2020 1510   MCH 31.4 02/03/2020 0945   MCHC 33.3 07/27/2020 1510   RDW 14.3 07/27/2020 1510   LYMPHSABS 2.9 07/27/2020 1510   MONOABS 0.6 07/27/2020 1510   EOSABS 0.1 07/27/2020 1510   BASOSABS 0.1 07/27/2020 1510    BMET    Component Value Date/Time   NA 142 02/03/2020 0945   K 2.5 (LL) 02/03/2020 0945   CL 105 02/03/2020 0945   CO2 27 02/03/2020 0945   GLUCOSE 110 (H) 02/03/2020 0945   BUN 8 02/03/2020 0945    CREATININE 0.47 02/03/2020 0945   CALCIUM 8.3 (L) 02/03/2020 0945   GFRNONAA >60 02/03/2020 0945   GFRAA >60 02/03/2020 0945    BNP No results found for: BNP  ProBNP No results found for: PROBNP  Imaging: No results found.   Assessment & Plan:   Stage 3 severe COPD by GOLD classification ([place]) - PFTs today showed severe obstructive airways disease without BD response. Very severe diffusion defect. Maintained on Trelegy 100 one puff daily; prn albuterol hfa or Duonebs q6. Breathing has worsened over time. She currently has a productive cough with thick mucus and shortness of breath with minimal exertion. Treating for AECOPD with prednisone taper and Doxycyline 100mg  BID x 7 days. Advised she use flutter valve 2-3 times a day. A letter was provided to help assist patient get a 3 bedroom apartment through section 8 housing. She lives alone and needs someone to stay with her at night. She is not vaccinated for covid or influenza. Due for CT chest in May 2022. She will follow-up with Dr. Vaughan Browner in July 2022.   Chronic respiratory failure with hypoxia ([place]) - Requires supplemental oxygen 3L continuously    Martyn Ehrich, NP 01/26/2021

## 2021-02-09 ENCOUNTER — Other Ambulatory Visit: Payer: Self-pay

## 2021-02-11 ENCOUNTER — Other Ambulatory Visit: Payer: Self-pay

## 2021-02-11 ENCOUNTER — Ambulatory Visit (INDEPENDENT_AMBULATORY_CARE_PROVIDER_SITE_OTHER): Payer: 59 | Admitting: Endocrinology

## 2021-02-11 VITALS — BP 118/74 | HR 76 | Ht 63.5 in | Wt 162.8 lb

## 2021-02-11 DIAGNOSIS — E0590 Thyrotoxicosis, unspecified without thyrotoxic crisis or storm: Secondary | ICD-10-CM | POA: Diagnosis not present

## 2021-02-11 MED ORDER — METHIMAZOLE 10 MG PO TABS
10.0000 mg | ORAL_TABLET | Freq: Two times a day (BID) | ORAL | 11 refills | Status: DC
Start: 2021-02-11 — End: 2021-03-15

## 2021-02-11 NOTE — Patient Instructions (Addendum)
Please take the methimazole pill, twice a day.     If ever you have fever while taking methimazole, stop it and call us, even if the reason is obvious, because of the risk of a rare side-effect.   It is best to never miss the medication.  However, if you do miss it, next best is to double up the next time.   Please come back for a follow-up appointment in 1 month.

## 2021-02-11 NOTE — Progress Notes (Signed)
Subjective:    Patient ID: Jill Spence, female    DOB: May 13, 1951, 70 y.o.   MRN: [id number]  HPI Pt returns for f/u of hyperthyroidism (dx'ed 2021; she has never been on therapy for this; 2021 US showed MNG; She cannot be isolated for RAI; pt lives alone, but has Water quality scientist; US showed LLP nodule, 2.9 cm (labeled 5) which meets criteria for bx.  nuc med scan showed LLP hyperfunctioning nodule).  She reports insomnia, nausea, and hair loss.  She did not start taking methimazole, as she prefers synthroid. Past Medical History:  Diagnosis Date  . Anemia    low iron  . Anxiety   . Arthritis   . Bipolar disorder ([place])   . COPD (chronic obstructive pulmonary disease) ([place])   . Family history of adverse reaction to anesthesia    mom "was put to sleep and she never woke up" -   . GERD (gastroesophageal reflux disease)   . Guillain Barr syndrome (H[facility])    numbness in toes, legs hurt  . Headache    migraines as a teenager  . IBS (irritable bowel syndrome)   . Lump, breast    removed years ago per Pt.  . Pneumonia   . Restless leg syndrome   . Seizures ([place])    only has one when she gets upset, has "quiet' seizures    Past Surgical History:  Procedure Laterality Date  . BREAST SURGERY    . COLONOSCOPY    . EYE SURGERY Bilateral    cataract surgery with lens implants  . MANDIBLE RECONSTRUCTION    . MINOR REMOVAL OF MANDIBULAR HARDWARE Right 10/15/2015   Procedure: MINOR REMOVAL OF Right MANDIBULAR HARDWARE;  Surgeon: Diona Browner, DDS;  Location: [facility];  Service: Oral Surgery;  Laterality: Right;  . TONSILLECTOMY      Social History   Socioeconomic History  . Marital status: Widowed    Spouse name: Not on file  . Number of children: Not on file  . Years of education: Not on file  . Highest education level: Not on file  Occupational History  . Not on file  Tobacco Use  . Smoking status: Former Smoker    Packs/day: 0.25    Years: 6.00    Pack years: 1.50     Types: Cigarettes    Quit date: 10/14/2007    Years since quitting: 13.3  . Smokeless tobacco: Never Used  Substance and Sexual Activity  . Alcohol use: No  . Drug use: No  . Sexual activity: Not on file  Other Topics Concern  . Not on file  Social History Narrative  . Not on file   Social Determinants of Health   Financial Resource Strain: Not on file  Food Insecurity: Not on file  Transportation Needs: Not on file  Physical Activity: Not on file  Stress: Not on file  Social Connections: Not on file  Intimate Partner Violence: Not on file    Current Outpatient Medications on File Prior to Visit  Medication Sig Dispense Refill  . albuterol (PROVENTIL HFA;VENTOLIN HFA) 108 (90 BASE) MCG/ACT inhaler Inhale 2 puffs into the lungs every 6 (six) hours as needed for wheezing.    Marland Kitchen albuterol (PROVENTIL) (2.5 MG/3ML) 0.083% nebulizer solution Take 2.5 mg by nebulization every 6 (six) hours as needed for wheezing.    Marland Kitchen alendronate (FOSAMAX) 70 MG tablet Take 70 mg by mouth every Sunday. Take with a full glass of water on an empty stomach.    Marland Kitchen  ARIPiprazole (ABILIFY) 15 MG tablet Take 15 mg by mouth daily.    . benzonatate (TESSALON) 200 MG capsule Take 200 mg by mouth 3 (three) times daily as needed for cough.    . benztropine (COGENTIN) 0.5 MG tablet Take 0.5 mg by mouth 2 (two) times daily.    . cetirizine (ZYRTEC) 10 MG tablet Take 10 mg by mouth daily.    Marland Kitchen doxycycline (VIBRA-TABS) 100 MG tablet Take 1 tablet (100 mg total) by mouth 2 (two) times daily. 14 tablet 0  . famciclovir (FAMVIR) 250 MG tablet Take 250 mg by mouth 2 (two) times daily.    . Fluticasone-Umeclidin-Vilant (TRELEGY ELLIPTA) 100-62.5-25 MCG/INH AEPB Inhale into the lungs.    . gabapentin (NEURONTIN) 300 MG capsule Take 600 mg by mouth 2 (two) times daily.    Marland Kitchen ipratropium-albuterol (DUONEB) 0.5-2.5 (3) MG/3ML SOLN Take 3 mLs by nebulization every 4 (four) hours as needed. 360 mL 5  . magnesium oxide (MAG-OX) 400  MG tablet Take 250 mg by mouth daily.    Marland Kitchen omeprazole (PRILOSEC) 40 MG capsule Take 40 mg by mouth daily.    Marland Kitchen oxcarbazepine (TRILEPTAL) 600 MG tablet Take 600 mg by mouth 2 (two) times daily.    Marland Kitchen oxyCODONE-acetaminophen (PERCOCET) 5-325 MG tablet Take 1-2 tablets by mouth every 4 (four) hours as needed. 30 tablet 0  . predniSONE (DELTASONE) 10 MG tablet Take 10 mg by mouth daily with breakfast.    . ranitidine (ZANTAC) 300 MG capsule Take 300 mg by mouth every evening.    . traMADol (ULTRAM) 50 MG tablet Take 50 mg by mouth every 6 (six) hours as needed for pain.    . traZODone (DESYREL) 50 MG tablet Take 50 mg by mouth at bedtime.    . Vitamin D, Ergocalciferol, (DRISDOL) 50000 UNITS CAPS capsule Take 50,000 Units by mouth every Sunday.     . potassium chloride SA (KLOR-CON) 20 MEQ tablet Take 1 tablet (20 mEq total) by mouth 2 (two) times daily for 7 days. 14 tablet 0   No current facility-administered medications on file prior to visit.    Allergies  Allergen Reactions  . Aspirin   . Influenza Vaccines     Family History  Problem Relation Age of Onset  . Cancer Father     BP 118/74 (BP Location: Right Arm, Patient Position: Sitting, Cuff Size: Normal)   Pulse 76   Ht 5' 3.5" (1.613 m)   Wt 162 lb 12.8 oz (73.8 kg)   SpO2 94%   BMI 28.39 kg/m    Review of Systems Denies fever.      Objective:   Physical Exam VITAL SIGNS:  See vs page GENERAL: no distress NECK: 2-3 cm left thyroid nodule is palpable.  No palpable lymphadenopathy at the anterior neck.       Assessment & Plan:  Hyperthyroidism: uncontrolled.  I explained that methimazole and Synthroid are opposites.  Pt agrees to take tapazole.    Patient Instructions  Please take the methimazole pill, twice a day.     If ever you have fever while taking methimazole, stop it and call us, even if the reason is obvious, because of the risk of a rare side-effect.   It is best to never miss the medication.  However, if  you do miss it, next best is to double up the next time.   Please come back for a follow-up appointment in 1 month.

## 2021-03-15 ENCOUNTER — Ambulatory Visit (INDEPENDENT_AMBULATORY_CARE_PROVIDER_SITE_OTHER): Payer: 59 | Admitting: Endocrinology

## 2021-03-15 ENCOUNTER — Other Ambulatory Visit: Payer: Self-pay

## 2021-03-15 VITALS — BP 120/70 | HR 81 | Ht 63.5 in | Wt 163.4 lb

## 2021-03-15 DIAGNOSIS — M2550 Pain in unspecified joint: Secondary | ICD-10-CM | POA: Diagnosis not present

## 2021-03-15 DIAGNOSIS — E0590 Thyrotoxicosis, unspecified without thyrotoxic crisis or storm: Secondary | ICD-10-CM | POA: Diagnosis not present

## 2021-03-15 LAB — T4, FREE: Free T4: 0.51 ng/dL — ABNORMAL LOW (ref 0.60–1.60)

## 2021-03-15 LAB — TSH: TSH: <0.01 u[IU]/mL — ABNORMAL LOW (ref 0.35–4.50)

## 2021-03-15 MED ORDER — METHIMAZOLE 10 MG PO TABS: 10.0000 mg | ORAL_TABLET | Freq: Every day | ORAL | 1 refills | Status: DC

## 2021-03-15 NOTE — Patient Instructions (Addendum)
Blood tests are requested for you today.  We'll let you know about the results.     If ever you have fever while taking methimazole, stop it and call us, even if the reason is obvious, because of the risk of a rare side-effect.   It is best to never miss the medication.  However, if you do miss it, next best is to double up the next time.   Please come back for a follow-up appointment in 6 weeks.

## 2021-03-15 NOTE — Progress Notes (Signed)
Subjective:    Patient ID: Jill Spence, female    DOB: December 22, 1950, 70 y.o.   MRN: [id number]  HPI Pt returns for f/u of hyperthyroidism (dx'ed 2021; she has never been on therapy for this; 2021 US showed MNG; She cannot be isolated for RAI; pt lives alone, but has Water quality scientist; US showed LLP nodule, 2.9 cm (labeled 5) which meets criteria for bx.  nuc med scan showed LLP hyperfunctioning nodule).  She reports anxiety, arthralgias, and difficulty with concentration.   Past Medical History:  Diagnosis Date  . Anemia    low iron  . Anxiety   . Arthritis   . Bipolar disorder ([place])   . COPD (chronic obstructive pulmonary disease) ([place])   . Family history of adverse reaction to anesthesia    mom "was put to sleep and she never woke up" -   . GERD (gastroesophageal reflux disease)   . Guillain Barr syndrome ([facility])    numbness in toes, legs hurt  . Headache    migraines as a teenager  . IBS (irritable bowel syndrome)   . Lump, breast    removed years ago per Pt.  . Pneumonia   . Restless leg syndrome   . Seizures ([place])    only has one when she gets upset, has "quiet' seizures    Past Surgical History:  Procedure Laterality Date  . BREAST SURGERY    . COLONOSCOPY    . EYE SURGERY Bilateral    cataract surgery with lens implants  . MANDIBLE RECONSTRUCTION    . MINOR REMOVAL OF MANDIBULAR HARDWARE Right 10/15/2015   Procedure: MINOR REMOVAL OF Right MANDIBULAR HARDWARE;  Surgeon: Diona Browner, DDS;  Location: [facility];  Service: Oral Surgery;  Laterality: Right;  . TONSILLECTOMY      Social History   Socioeconomic History  . Marital status: Widowed    Spouse name: Not on file  . Number of children: Not on file  . Years of education: Not on file  . Highest education level: Not on file  Occupational History  . Not on file  Tobacco Use  . Smoking status: Former Smoker    Packs/day: 0.25    Years: 6.00    Pack years: 1.50    Types: Cigarettes    Quit date:  10/14/2007    Years since quitting: 13.4  . Smokeless tobacco: Never Used  Substance and Sexual Activity  . Alcohol use: No  . Drug use: No  . Sexual activity: Not on file  Other Topics Concern  . Not on file  Social History Narrative  . Not on file   Social Determinants of Health   Financial Resource Strain: Not on file  Food Insecurity: Not on file  Transportation Needs: Not on file  Physical Activity: Not on file  Stress: Not on file  Social Connections: Not on file  Intimate Partner Violence: Not on file    Current Outpatient Medications on File Prior to Visit  Medication Sig Dispense Refill  . albuterol (PROVENTIL HFA;VENTOLIN HFA) 108 (90 BASE) MCG/ACT inhaler Inhale 2 puffs into the lungs every 6 (six) hours as needed for wheezing.    Marland Kitchen albuterol (PROVENTIL) (2.5 MG/3ML) 0.083% nebulizer solution Take 2.5 mg by nebulization every 6 (six) hours as needed for wheezing.    Marland Kitchen alendronate (FOSAMAX) 70 MG tablet Take 70 mg by mouth every Sunday. Take with a full glass of water on an empty stomach.    . ARIPiprazole (ABILIFY) 15 MG  tablet Take 15 mg by mouth daily.    . benzonatate (TESSALON) 200 MG capsule Take 200 mg by mouth 3 (three) times daily as needed for cough.    . benztropine (COGENTIN) 0.5 MG tablet Take 0.5 mg by mouth 2 (two) times daily.    . cetirizine (ZYRTEC) 10 MG tablet Take 10 mg by mouth daily.    Marland Kitchen doxycycline (VIBRA-TABS) 100 MG tablet Take 1 tablet (100 mg total) by mouth 2 (two) times daily. 14 tablet 0  . famciclovir (FAMVIR) 250 MG tablet Take 250 mg by mouth 2 (two) times daily.    . Fluticasone-Umeclidin-Vilant (TRELEGY ELLIPTA) 100-62.5-25 MCG/INH AEPB Inhale into the lungs.    . gabapentin (NEURONTIN) 300 MG capsule Take 600 mg by mouth 2 (two) times daily.    Marland Kitchen ipratropium-albuterol (DUONEB) 0.5-2.5 (3) MG/3ML SOLN Take 3 mLs by nebulization every 4 (four) hours as needed. 360 mL 5  . magnesium oxide (MAG-OX) 400 MG tablet Take 250 mg by mouth  daily.    Marland Kitchen omeprazole (PRILOSEC) 40 MG capsule Take 40 mg by mouth daily.    Marland Kitchen oxcarbazepine (TRILEPTAL) 600 MG tablet Take 600 mg by mouth 2 (two) times daily.    Marland Kitchen oxyCODONE-acetaminophen (PERCOCET) 5-325 MG tablet Take 1-2 tablets by mouth every 4 (four) hours as needed. 30 tablet 0  . predniSONE (DELTASONE) 10 MG tablet Take 10 mg by mouth daily with breakfast.    . ranitidine (ZANTAC) 300 MG capsule Take 300 mg by mouth every evening.    . traMADol (ULTRAM) 50 MG tablet Take 50 mg by mouth every 6 (six) hours as needed for pain.    . traZODone (DESYREL) 50 MG tablet Take 50 mg by mouth at bedtime.    . Vitamin D, Ergocalciferol, (DRISDOL) 50000 UNITS CAPS capsule Take 50,000 Units by mouth every Sunday.     . potassium chloride SA (KLOR-CON) 20 MEQ tablet Take 1 tablet (20 mEq total) by mouth 2 (two) times daily for 7 days. 14 tablet 0   No current facility-administered medications on file prior to visit.    Allergies  Allergen Reactions  . Aspirin   . Influenza Vaccines     Family History  Problem Relation Age of Onset  . Cancer Father     BP 120/70 (BP Location: Right Arm, Patient Position: Sitting, Cuff Size: Normal)   Pulse 81   Ht 5' 3.5" (1.613 m)   Wt 163 lb 6.4 oz (74.1 kg)   SpO2 91%   BMI 28.49 kg/m    Review of Systems Denies fever.      Objective:   Physical Exam VITAL SIGNS:  See vs page.  GENERAL: no distress.  Has 02 on.   NECK: 2-3 cm left thyroid nodule is palpable.  No palpable lymphadenopathy at the anterior neck.    Lab Results  Component Value Date   TSH <0.01 (L) 03/15/2021       Assessment & Plan:  Hyperthyroidism: uncontrolled.  reduce the methimazole to 1 pill per day, as she is likely to need less rx over time.

## 2021-03-23 ENCOUNTER — Telehealth: Payer: Self-pay | Admitting: Pulmonary Disease

## 2021-03-23 DIAGNOSIS — J9611 Chronic respiratory failure with hypoxia: Secondary | ICD-10-CM

## 2021-03-23 DIAGNOSIS — J449 Chronic obstructive pulmonary disease, unspecified: Secondary | ICD-10-CM

## 2021-03-23 MED ORDER — BENZONATATE 200 MG PO CAPS: 200.0000 mg | ORAL_CAPSULE | Freq: Three times a day (TID) | ORAL | 0 refills | Status: DC | PRN

## 2021-03-23 NOTE — Telephone Encounter (Signed)
Called and spoke with Verdis Frederickson (ok on dpr) regarding prescription for tessalon pearls. Verdis Frederickson states that patient was prescribed Tessalon from her previous Pulmonologist in [facility], then prescription was picked up by her PCP once she left that office. PCP has reduced her Tessalon to keep her from coughing and choking at night. Patient is chocking and gagging during the day as well. Patient would like her a prescription called into her pharmacy. States she was on 200 mg three times a day.   Patient states she is schedule for a follow up in July. She would like to be seen sooner if possible. She is concerned about choking.   please advise

## 2021-03-23 NOTE — Telephone Encounter (Signed)
Please schedule follow-up office visit with Dr. Lauro Franklin to assess Okay to provide Tessalon 200 mg 3 times daily as needed #30 until then

## 2021-03-23 NOTE — Telephone Encounter (Signed)
Per caregiver, Maria(ok per dpr) pt is choking more/inflammation built up. Pt states the tessalon perrles and mucinex helped. Tessalon perrles were decreased by pcp, since then more inflammation. PCP wants Pulm to pick up the tessalon perrles.Please advise. [phone]

## 2021-03-23 NOTE — Telephone Encounter (Signed)
Pt uses [facility] as her DME>  She is requesting that an order be sent in for her to get fitted for a POC as she is using the larger tanks and this is not easy for her to handle.  PM please advise on placing this order.   Thanks

## 2021-03-23 NOTE — Telephone Encounter (Signed)
Called and spoke with Jill Spence, advised that we will send in #30 capsules into the pharmacy until patient follows up with Dr. Annie Spence is going to call back to schedule appointment once she gets to her appointment book.  Varied to send prescription into [facility] in [place].  Nothing further needed at this time.

## 2021-03-24 NOTE — Telephone Encounter (Signed)
Called Verdis Frederickson (who is on DPR) and let her know that Dr. Vaughan Browner said he was okay with [place] placing order for POC for pt. Verdis Frederickson verbalized understanding. Nothing further needed.

## 2021-03-24 NOTE — Telephone Encounter (Signed)
Ok to place POC order

## 2021-04-14 ENCOUNTER — Ambulatory Visit (HOSPITAL_BASED_OUTPATIENT_CLINIC_OR_DEPARTMENT_OTHER)
Admission: RE | Admit: 2021-04-14 | Discharge: 2021-04-14 | Disposition: A | Discharge status: Home / Self Care | Payer: 59 | Source: Ambulatory Visit | Attending: Pulmonary Disease | Admitting: Pulmonary Disease

## 2021-04-14 ENCOUNTER — Other Ambulatory Visit: Payer: Self-pay

## 2021-04-14 DIAGNOSIS — R911 Solitary pulmonary nodule: Secondary | ICD-10-CM

## 2021-04-15 ENCOUNTER — Telehealth: Payer: Self-pay | Admitting: Pulmonary Disease

## 2021-04-15 DIAGNOSIS — R9389 Abnormal findings on diagnostic imaging of other specified body structures: Secondary | ICD-10-CM

## 2021-04-15 DIAGNOSIS — R911 Solitary pulmonary nodule: Secondary | ICD-10-CM

## 2021-04-15 NOTE — Telephone Encounter (Signed)
Called and spoke with Verdis Frederickson (DPR).  Lelan Pons is Patient's Peer Support Person and takes her to appointments.  Maria's number is listed as Patient mobile number. Verdis Frederickson stated Patient can only be reached at [phone]. Asked Verdis Frederickson to have Patient call office if she was to speak with her. CT results were not given to [facility].  ATC Patient on home number.  Unable to leave message.

## 2021-04-15 NOTE — Telephone Encounter (Signed)
Dr. Vaughan Browner aware of CT chest results.  Per Dr. Vaughan Browner, let patient know there are new nodules and order PET scan.   [place] and spoke with Hoyle Sauer.  Super D is being sent to [facility] via courier. ATC Patient. Home number no answer, no VM.  Called cell number and left message for Patient to return call. Pet scan order pended until I speak results to Patient.

## 2021-04-15 NOTE — Telephone Encounter (Signed)
ATC Patient x's 4.  No answer and unable to leave message. Will route message to myself and follow up Monday.

## 2021-04-15 NOTE — Telephone Encounter (Signed)
Received call report from [place] with [facility] on patient's CT chest done on 04/14/21. Dr. Vaughan Browner,  please review the result/impression copied below:  IMPRESSION: 1. Although the right middle lobe pulmonary nodules are relatively stable, there has been interval development of multiple solid pulmonary nodules as detailed above. Findings are suspicious for 1 or more primary bronchogenic carcinomas versus metastatic disease. Consider multidisciplinary thoracic oncology consultation with potential clinical strategies of tissue sampling and/or PET (nodules at the low end of PET resolution). 2. No thoracic adenopathy. 3. Aortic atherosclerosis (ICD10-I70.0), coronary artery atherosclerosis and emphysema (ICD10-J43.9). 4. Left-sided thyroid nodule, as before. This has been evaluated on previous imaging. (ref: J Am Coll Radiol. 2015 Feb;12(2): 143-50). 5. Pulmonary artery enlargement suggests pulmonary arterial hypertension.   Please advise, thank you.

## 2021-04-18 NOTE — Telephone Encounter (Signed)
ATC Patient.  LM to call back for CT results. Will try again at a later time.

## 2021-04-18 NOTE — Telephone Encounter (Signed)
ATC Patient.  I was able to leave message for Patient to call back for CT results. Will try again later today.

## 2021-04-18 NOTE — Telephone Encounter (Signed)
ATC Patient.  LM to call back. 

## 2021-04-19 NOTE — Telephone Encounter (Signed)
Ok to give letter

## 2021-04-19 NOTE — Telephone Encounter (Signed)
Super D received today via courier and given to Dr. Vaughan Browner.

## 2021-04-19 NOTE — Telephone Encounter (Signed)
Letter has been completed and placed 2 copies in the mail to the pt.

## 2021-04-19 NOTE — Telephone Encounter (Signed)
Called and spoke to pt. Informed pt of the results and recs per Dr. Vaughan Browner. PET scan ordered. Pt's nurse will be arriving soon to her home and call when she can take the appt info for PET scan.   Pt also states the apartment complex she lives at is requiring a letter stating it is medically neccesary for pt to have an ADT security system, a RING door bell camera and a combination lock and key on her front door. Pt states she had to change the locks on her door so she can give EMS/emergency personnel the combination if she is in an emergency and cannot come to the door. Pt states this is the same reason she has the RING door bell camera, she doesn't get around well and wants to be able to have a camera at her door to see who is there, reducing fall risk. Pt is wanting the letter (2 copies) mailed to her home, address verified.   Dr. Vaughan Browner, please advise if ok to write a letter to her apartment complex to have ADT alarm system, RING door bell camera, and combination locking system on her door. Thanks.

## 2021-04-26 ENCOUNTER — Ambulatory Visit (INDEPENDENT_AMBULATORY_CARE_PROVIDER_SITE_OTHER): Payer: 59 | Admitting: Endocrinology

## 2021-04-26 ENCOUNTER — Other Ambulatory Visit: Payer: Self-pay

## 2021-04-26 VITALS — BP 160/100 | HR 74 | Ht 63.5 in | Wt 167.4 lb

## 2021-04-26 DIAGNOSIS — E0590 Thyrotoxicosis, unspecified without thyrotoxic crisis or storm: Secondary | ICD-10-CM

## 2021-04-26 LAB — T4, FREE: Free T4: 0.37 ng/dL — ABNORMAL LOW (ref 0.60–1.60)

## 2021-04-26 LAB — TSH: TSH: 1.91 u[IU]/mL (ref 0.35–4.50)

## 2021-04-26 MED ORDER — METHIMAZOLE 5 MG PO TABS: 5.0000 mg | ORAL_TABLET | Freq: Every day | ORAL | 3 refills | Status: DC

## 2021-04-26 NOTE — Patient Instructions (Addendum)
Blood tests are requested for you today.  We'll let you know about the results.     If ever you have fever while taking methimazole, stop it and call us, even if the reason is obvious, because of the risk of a rare side-effect.   It is best to never miss the medication.  However, if you do miss it, next best is to double up the next time.   We'll plan to recheck the ultrasound later this year.   Please come back for a follow-up appointment in 2 months.

## 2021-04-26 NOTE — Progress Notes (Signed)
Subjective:    Patient ID: Jill Spence, female    DOB: Jun 17, 1951, 70 y.o.   MRN: [id number]  HPI Pt returns for f/u of hyperthyroidism (dx'ed 2021; she has never been on therapy for this; 2021 US showed MNG; She cannot be isolated for RAI; pt lives alone, but has Water quality scientist; US showed LLP nodule, 2.9 cm (labeled 5) which meets criteria for bx.  nuc med scan showed LLP hyperfunctioning nodule).  Pt reports ongoing anxiety.  She takes tapazole 10/d, as rx'ed.   Past Medical History:  Diagnosis Date  . Anemia    low iron  . Anxiety   . Arthritis   . Bipolar disorder ([place])   . COPD (chronic obstructive pulmonary disease) ([place])   . Family history of adverse reaction to anesthesia    mom "was put to sleep and she never woke up" -   . GERD (gastroesophageal reflux disease)   . Guillain Barr syndrome (HCC)    numbness in toes, legs hurt  . Headache    migraines as a teenager  . IBS (irritable bowel syndrome)   . Lump, breast    removed years ago per Pt.  . Pneumonia   . Restless leg syndrome   . Seizures ([place])    only has one when she gets upset, has "quiet' seizures    Past Surgical History:  Procedure Laterality Date  . BREAST SURGERY    . COLONOSCOPY    . EYE SURGERY Bilateral    cataract surgery with lens implants  . MANDIBLE RECONSTRUCTION    . MINOR REMOVAL OF MANDIBULAR HARDWARE Right 10/15/2015   Procedure: MINOR REMOVAL OF Right MANDIBULAR HARDWARE;  Surgeon: Diona Browner, DDS;  Location: [facility];  Service: Oral Surgery;  Laterality: Right;  . TONSILLECTOMY      Social History   Socioeconomic History  . Marital status: Widowed    Spouse name: Not on file  . Number of children: Not on file  . Years of education: Not on file  . Highest education level: Not on file  Occupational History  . Not on file  Tobacco Use  . Smoking status: Former Smoker    Packs/day: 0.25    Years: 6.00    Pack years: 1.50    Types: Cigarettes    Quit date: 10/14/2007     Years since quitting: 13.5  . Smokeless tobacco: Never Used  Substance and Sexual Activity  . Alcohol use: No  . Drug use: No  . Sexual activity: Not on file  Other Topics Concern  . Not on file  Social History Narrative  . Not on file   Social Determinants of Health   Financial Resource Strain: Not on file  Food Insecurity: Not on file  Transportation Needs: Not on file  Physical Activity: Not on file  Stress: Not on file  Social Connections: Not on file  Intimate Partner Violence: Not on file    Current Outpatient Medications on File Prior to Visit  Medication Sig Dispense Refill  . albuterol (PROVENTIL HFA;VENTOLIN HFA) 108 (90 BASE) MCG/ACT inhaler Inhale 2 puffs into the lungs every 6 (six) hours as needed for wheezing.    Marland Kitchen albuterol (PROVENTIL) (2.5 MG/3ML) 0.083% nebulizer solution Take 2.5 mg by nebulization every 6 (six) hours as needed for wheezing.    Marland Kitchen alendronate (FOSAMAX) 70 MG tablet Take 70 mg by mouth every Sunday. Take with a full glass of water on an empty stomach.    . ARIPiprazole (  ABILIFY) 15 MG tablet Take 15 mg by mouth daily.    . benzonatate (TESSALON) 200 MG capsule Take 200 mg by mouth 3 (three) times daily as needed for cough.    . benzonatate (TESSALON) 200 MG capsule Take 1 capsule (200 mg total) by mouth 3 (three) times daily as needed for cough. 30 capsule 0  . benztropine (COGENTIN) 0.5 MG tablet Take 0.5 mg by mouth 2 (two) times daily.    . cetirizine (ZYRTEC) 10 MG tablet Take 10 mg by mouth daily.    Marland Kitchen doxycycline (VIBRA-TABS) 100 MG tablet Take 1 tablet (100 mg total) by mouth 2 (two) times daily. 14 tablet 0  . famciclovir (FAMVIR) 250 MG tablet Take 250 mg by mouth 2 (two) times daily.    . Fluticasone-Umeclidin-Vilant (TRELEGY ELLIPTA) 100-62.5-25 MCG/INH AEPB Inhale into the lungs.    . gabapentin (NEURONTIN) 300 MG capsule Take 600 mg by mouth 2 (two) times daily.    Marland Kitchen ipratropium-albuterol (DUONEB) 0.5-2.5 (3) MG/3ML SOLN Take 3  mLs by nebulization every 4 (four) hours as needed. 360 mL 5  . magnesium oxide (MAG-OX) 400 MG tablet Take 250 mg by mouth daily.    Marland Kitchen omeprazole (PRILOSEC) 40 MG capsule Take 40 mg by mouth daily.    Marland Kitchen oxcarbazepine (TRILEPTAL) 600 MG tablet Take 600 mg by mouth 2 (two) times daily.    Marland Kitchen oxyCODONE-acetaminophen (PERCOCET) 5-325 MG tablet Take 1-2 tablets by mouth every 4 (four) hours as needed. 30 tablet 0  . predniSONE (DELTASONE) 10 MG tablet Take 10 mg by mouth daily with breakfast.    . ranitidine (ZANTAC) 300 MG capsule Take 300 mg by mouth every evening.    . traMADol (ULTRAM) 50 MG tablet Take 50 mg by mouth every 6 (six) hours as needed for pain.    . traZODone (DESYREL) 50 MG tablet Take 50 mg by mouth at bedtime.    . Vitamin D, Ergocalciferol, (DRISDOL) 50000 UNITS CAPS capsule Take 50,000 Units by mouth every Sunday.     . potassium chloride SA (KLOR-CON) 20 MEQ tablet Take 1 tablet (20 mEq total) by mouth 2 (two) times daily for 7 days. 14 tablet 0   No current facility-administered medications on file prior to visit.    Allergies  Allergen Reactions  . Aspirin   . Influenza Vaccines     Family History  Problem Relation Age of Onset  . Cancer Father     BP (!) 160/100 (BP Location: Right Arm, Patient Position: Sitting, Cuff Size: Large)   Pulse 74   Ht 5' 3.5" (1.613 m)   Wt 167 lb 6.4 oz (75.9 kg)   SpO2 91%   BMI 29.19 kg/m   Review of Systems Denies fever.      Objective:   Physical Exam VITAL SIGNS:  See vs page.  GENERAL: no distress.  Has 02 on.   NECK: 2-3 cm left thyroid nodule is again palpable.  No palpable lymphadenopathy at the anterior neck.     Lab Results  Component Value Date   TSH 1.91 04/26/2021      Assessment & Plan:  Hyperthyroidism: overcontrolled.  Reduce tapazole to 5/d

## 2021-04-29 ENCOUNTER — Other Ambulatory Visit: Payer: Self-pay

## 2021-04-29 ENCOUNTER — Encounter (HOSPITAL_COMMUNITY)
Admission: RE | Admit: 2021-04-29 | Discharge: 2021-04-29 | Disposition: A | Discharge status: Home / Self Care | Payer: 59 | Source: Ambulatory Visit | Attending: Pulmonary Disease | Admitting: Pulmonary Disease

## 2021-04-29 DIAGNOSIS — R9389 Abnormal findings on diagnostic imaging of other specified body structures: Secondary | ICD-10-CM | POA: Insufficient documentation

## 2021-04-29 DIAGNOSIS — R918 Other nonspecific abnormal finding of lung field: Secondary | ICD-10-CM | POA: Insufficient documentation

## 2021-04-29 DIAGNOSIS — R911 Solitary pulmonary nodule: Secondary | ICD-10-CM | POA: Diagnosis present

## 2021-04-29 LAB — GLUCOSE, CAPILLARY: Glucose-Capillary: 103 mg/dL — ABNORMAL HIGH (ref 70–99)

## 2021-04-29 MED ORDER — FLUDEOXYGLUCOSE F - 18 (FDG) INJECTION
8.4000 | Freq: Once | INTRAVENOUS | Status: AC
  Administered 2021-04-29: 8.36 via INTRAVENOUS

## 2021-05-04 ENCOUNTER — Telehealth: Payer: Self-pay | Admitting: Pulmonary Disease

## 2021-05-04 DIAGNOSIS — C4490 Unspecified malignant neoplasm of skin, unspecified: Secondary | ICD-10-CM

## 2021-05-04 NOTE — Telephone Encounter (Signed)
Referral has been placed for the pt to get appt with Dermatology.

## 2021-05-04 NOTE — Telephone Encounter (Signed)
PET scan 04/29/2021 with findings of positive cutaneous lesion along left anterior shoulder/axilla and multiple pulmonary nodules suspicious for skin cancer with meta stasis  I called and reviewed results with patient.  She reports having a keloid in that area which may be a site of malignancy  Please make an urgent referral to dermatology.  If we cannot get her to a dermatologist within the next few weeks then make a referral to IR for evaluation for biopsy  Marshell Garfinkel MD [facility] 05/04/2021, 5:34 PM

## 2021-05-13 ENCOUNTER — Other Ambulatory Visit: Payer: Self-pay | Admitting: Pulmonary Disease

## 2021-05-13 ENCOUNTER — Telehealth: Payer: Self-pay | Admitting: Pulmonary Disease

## 2021-05-13 NOTE — Telephone Encounter (Signed)
Continued.Marland Kitchen She would like PM to speak with her cousin who's a dr regarding the cancer and her having to wait to get the biopsy.

## 2021-05-13 NOTE — Telephone Encounter (Signed)
ATC x1.  LVM to return call. 

## 2021-05-16 NOTE — Telephone Encounter (Signed)
Tessalon #30 with 10 RF sent in on 05/13/21  Tried calling the pt and there was no answer- LMTCB

## 2021-05-17 NOTE — Telephone Encounter (Signed)
Tried calling the pt again and still no answer- left detailed msg letting her know to please call us back if still needing assistance.

## 2021-05-18 ENCOUNTER — Other Ambulatory Visit: Payer: Self-pay | Admitting: *Deleted

## 2021-05-18 DIAGNOSIS — C4490 Unspecified malignant neoplasm of skin, unspecified: Secondary | ICD-10-CM

## 2021-05-18 NOTE — Telephone Encounter (Signed)
I called and spoke with Craig Guess the patient's support specialist at [phone]  Mrs. Philippi cannot get a dermatology appointment till early next year  There are appointments available at [facility] phone [phone], fax [phone] Will send new referral to this place.  Advised Craig Guess that if this appointment is delayed too then will need to look for alternatives as she needs to be seen as soon as possible  Marshell Garfinkel MD [facility] 05/18/2021, 9:16 AM

## 2021-05-18 NOTE — Telephone Encounter (Signed)
Dermatology referral placed for [facility].  Nothing further at this time.

## 2021-05-24 ENCOUNTER — Ambulatory Visit (INDEPENDENT_AMBULATORY_CARE_PROVIDER_SITE_OTHER): Payer: 59 | Admitting: Family

## 2021-05-24 ENCOUNTER — Encounter: Payer: Self-pay | Admitting: Family

## 2021-05-24 ENCOUNTER — Other Ambulatory Visit: Payer: Self-pay

## 2021-05-24 VITALS — BP 124/78 | HR 64 | Temp 98.2°F | Ht 63.0 in | Wt 164.8 lb

## 2021-05-24 DIAGNOSIS — E0590 Thyrotoxicosis, unspecified without thyrotoxic crisis or storm: Secondary | ICD-10-CM

## 2021-05-24 DIAGNOSIS — G8929 Other chronic pain: Secondary | ICD-10-CM

## 2021-05-24 DIAGNOSIS — G610 Guillain-Barre syndrome: Secondary | ICD-10-CM | POA: Diagnosis not present

## 2021-05-24 DIAGNOSIS — Z8659 Personal history of other mental and behavioral disorders: Secondary | ICD-10-CM | POA: Diagnosis not present

## 2021-05-24 DIAGNOSIS — J9611 Chronic respiratory failure with hypoxia: Secondary | ICD-10-CM

## 2021-05-24 DIAGNOSIS — J449 Chronic obstructive pulmonary disease, unspecified: Secondary | ICD-10-CM

## 2021-05-24 DIAGNOSIS — M419 Scoliosis, unspecified: Secondary | ICD-10-CM

## 2021-05-24 NOTE — Progress Notes (Addendum)
Jill Spence is a 70 y.o. female with the following history as recorded in EpicCare:  Patient Active Problem List   Diagnosis Date Noted   Arthralgia 03/15/2021   Stage 3 severe COPD by GOLD classification ([place]) 01/26/2021   Chronic respiratory failure with hypoxia ([place]) 01/26/2021   Hyperthyroidism 08/10/2020    Current Outpatient Medications  Medication Sig Dispense Refill   albuterol (PROVENTIL HFA;VENTOLIN HFA) 108 (90 BASE) MCG/ACT inhaler Inhale 2 puffs into the lungs every 6 (six) hours as needed for wheezing.     albuterol (PROVENTIL) (2.5 MG/3ML) 0.083% nebulizer solution Take 2.5 mg by nebulization every 6 (six) hours as needed for wheezing.     alendronate (FOSAMAX) 70 MG tablet Take 70 mg by mouth every Sunday. Take with a full glass of water on an empty stomach.     ARIPiprazole (ABILIFY) 15 MG tablet Take 15 mg by mouth daily.     benzonatate (TESSALON) 200 MG capsule Take 200 mg by mouth 3 (three) times daily as needed for cough.     benztropine (COGENTIN) 0.5 MG tablet Take 0.5 mg by mouth 2 (two) times daily.     cetirizine (ZYRTEC) 10 MG tablet Take 10 mg by mouth daily.     cromolyn (OPTICROM) 4 % ophthalmic solution Place 1 drop into both eyes 4 times daily.     famciclovir (FAMVIR) 250 MG tablet Take 250 mg by mouth 2 (two) times daily.     Fluticasone-Umeclidin-Vilant (TRELEGY ELLIPTA) 100-62.5-25 MCG/INH AEPB Inhale into the lungs.     gabapentin (NEURONTIN) 300 MG capsule Take 600 mg by mouth 2 (two) times daily.     hydrOXYzine (ATARAX/VISTARIL) 25 MG tablet Take 25 mg by mouth 2 (two) times daily.     ipratropium-albuterol (DUONEB) 0.5-2.5 (3) MG/3ML SOLN Take 3 mLs by nebulization every 4 (four) hours as needed. 360 mL 5   magnesium oxide (MAG-OX) 400 MG tablet Take 250 mg by mouth daily.     methimazole (TAPAZOLE) 5 MG tablet Take 1 tablet (5 mg total) by mouth daily. 90 tablet 3   oxcarbazepine (TRILEPTAL) 600 MG tablet Take 600 mg by mouth 2 (two) times  daily.     oxyCODONE-acetaminophen (PERCOCET) 5-325 MG tablet Take 1-2 tablets by mouth every 4 (four) hours as needed. 30 tablet 0   predniSONE (DELTASONE) 10 MG tablet Take 10 mg by mouth daily with breakfast.     ranitidine (ZANTAC) 300 MG capsule Take 300 mg by mouth every evening.     Vitamin D, Ergocalciferol, (DRISDOL) 50000 UNITS CAPS capsule Take 50,000 Units by mouth every Sunday.      potassium chloride SA (KLOR-CON) 20 MEQ tablet Take 1 tablet (20 mEq total) by mouth 2 (two) times daily for 7 days. 14 tablet 0   traZODone (DESYREL) 50 MG tablet Take 50 mg by mouth at bedtime. (Patient not taking: Reported on 05/24/2021)     No current facility-administered medications for this visit.    Allergies: Aspirin and Influenza vaccines  Past Medical History:  Diagnosis Date   Anemia    low iron   Anxiety    Arthritis    Bipolar disorder (HCC)    COPD (chronic obstructive pulmonary disease) (HCC)    Family history of adverse reaction to anesthesia    mom "was put to sleep and she never woke up" -    GERD (gastroesophageal reflux disease)    Guillain Barr syndrome (HCC)    numbness in toes, legs hurt  Headache    migraines as a teenager   IBS (irritable bowel syndrome)    Lump, breast    removed years ago per Pt.   Pneumonia    Restless leg syndrome    Seizures ([place])    only has one when she gets upset, has "quiet' seizures    Past Surgical History:  Procedure Laterality Date   BREAST SURGERY     COLONOSCOPY     EYE SURGERY Bilateral    cataract surgery with lens implants   MANDIBLE RECONSTRUCTION     MINOR REMOVAL OF MANDIBULAR HARDWARE Right 10/15/2015   Procedure: MINOR REMOVAL OF Right MANDIBULAR HARDWARE;  Surgeon: Diona Browner, DDS;  Location: [facility];  Service: Oral Surgery;  Laterality: Right;   TONSILLECTOMY      Family History  Problem Relation Age of Onset   Cancer Father     Social History   Tobacco Use   Smoking status: Former    Packs/day: 0.25     Years: 6.00    Pack years: 1.50    Types: Cigarettes    Quit date: 10/14/2007    Years since quitting: 13.6   Smokeless tobacco: Never  Substance Use Topics   Alcohol use: No    Subjective:  Patient presents today as a new patient. She is accompanied by her health aide from [place]. Patient is a very poor historian and neither patient nor aide have records for review. Patient notes she is here to meet new PCP and to discuss management of her Oxycodone prescription. She has previously been getting her medication from her PCP but has apparently been dismissed from that practice. In reviewing PDMP, she got a refill on Oxycodone earlier this month from her former PCP.  She notes the pain is due to chronic back issues and scoliosis. She indicates that she is under the care of [facility] but they will not write her Oxycdone. She is unsure of the name of the spine provider at time of her OV.   She has prescriptions for Abilify, Cogentin and Trileptal. Initially, she indicates she is taking these as needed. She then notes she is not sure what these medications are used for and is not sure why they are on her medication list. She becomes very angry and agitated during medication review and arguing that her previous PCP was "just giving her all this stuff" and not taking care of her. She notes she has been under the care of psychiatrist in the past but has been okay with the help of her aide and has not felt need to see psychiatrist.  She mentions history of Guillain Barre and some type of seizure disorder but is not under the care of neurology.   She does have pulmonology and endocrinology through the [facility] system.    Objective:  Vitals:   05/24/21 1400  BP: 124/78  Pulse: 64  Temp: 98.2 F (36.8 C)  TempSrc: Oral  SpO2: 94%  Weight: 164 lb 12.8 oz (74.8 kg)  Height: 5\' 3"  (1.6 m)    General: Well developed, well nourished, in no acute distress  Skin : Warm and dry.   Head: Normocephalic and atraumatic  Lungs: Respirations unlabored; wearing oxygen; Neurologic: Alert and oriented; speech intact; face symmetrical; in wheelchair;  Assessment:  1. Other chronic pain   2. Scoliosis, unspecified scoliosis type, unspecified spinal region   3. History of bipolar disorder   4. Guillain Barr syndrome ([place])   5.  Hyperthyroidism   6. Chronic respiratory failure with hypoxia (HCC)   7. Stage 3 severe COPD by GOLD classification ([place])     Plan:  & 2. Patient asked multiple times regarding management of her Oxycodone. I repeatedly explained to patient that I would not be able to refill her Oxycodone or provide pain management for her. I did offer her a pain management referral. She indicated that she did not know what a pain management provider would do for her and that she had never had to see a specialist in the past; however, in reviewing notes, it appears she was referred to pain management in the [facility] system in 2019 and had been with [facility] at some time in the past; Per patient, she is under care of spine and scoliosis provider but cannot provide name at time of OV; Explained to patient that I would not feel comfortable managing her Abilify or bipolar disorder and would recommend that she be under the care of a psychiatrist. She deferred the referral and indicated that she was doing well working with her aide through [employer]. Discussed seeing neurology and she again did not want the referral. Continue with her endocrinologist; 6. & 7. Continue with pulmonologist;   Time spent with patient 30 minutes- reviewing medications and discussing appropriate options for management of her chronic needs;    Patient indicated at end of OV that she preferred to find a different PCP who was able to more completely manage all of her healthcare needs rather than having to go to specialists. Agree that this would be most appropriate. We will plan to  mail her a formal letter documenting her decision to seek care elsewhere.   This visit occurred during the SARS-CoV-2 public health emergency.  Safety protocols were in place, including screening questions prior to the visit, additional usage of staff PPE, and extensive cleaning of exam room while observing appropriate contact time as indicated for disinfecting solutions.    No follow-ups on file.  Orders Placed This Encounter  Procedures   Ambulatory referral to Pain Clinic    Referral Priority:   Routine    Referral Type:   Consultation    Referral Reason:   Specialty Services Required    Requested Specialty:   Pain Medicine    Number of Visits Requested:   1    Requested Prescriptions    No prescriptions requested or ordered in this encounter

## 2021-05-25 ENCOUNTER — Encounter: Payer: Self-pay | Admitting: Family

## 2021-05-31 ENCOUNTER — Telehealth: Payer: Self-pay | Admitting: Pulmonary Disease

## 2021-05-31 NOTE — Telephone Encounter (Deleted)
Attempted to call patient VM

## 2021-05-31 NOTE — Telephone Encounter (Signed)
Attempted to call patient to verify what quantity she is currently receiving. We never prescribed medication for patient. Her voicemail box is full. Will try later.

## 2021-06-02 IMAGING — CT NM PET TUM IMG INITIAL (PI) SKULL BASE T - THIGH
1 of 7 series · 1 of 25 positions shown · non-contrast
Comparison: Multiple prior CTs, most recently CT chest dated
04/14/2021

CLINICAL DATA: Initial treatment strategy for multiple pulmonary
nodules.

EXAM:
NUCLEAR MEDICINE PET SKULL BASE TO THIGH
TECHNIQUE: 8.4 mCi F-18 FDG was injected intravenously. Full-ring PET imaging
was performed from the skull base to thigh after the radiotracer. CT
data was obtained and used for attenuation correction and anatomic
localization.
Fasting blood glucose: 103 mg/dl

[Series 4: ct sk_thigh 5.0 bf37 · axial · 5.0mm · 0.98mm/px · 1 of 218 slices shown]
[im 218/218  brain]
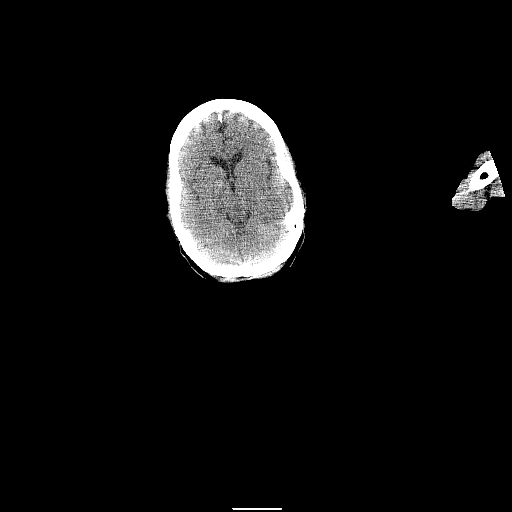

[1 of 25 positions shown; findings below may reference images not displayed]

FINDINGS: Mediastinal blood pool activity: SUV max

Liver activity: SUV max NA

NECK: No hypermetabolic cervical lymphadenopathy.

Incidental CT findings: none

CHEST: 12 x 9 mm cutaneous/subcutaneous lesion along the left
anterior shoulder/axilla (series 4/image 31), max SUV 34.5. This is
suspicious for primary cutaneous neoplasm such as melanoma.

Multiple bilateral pulmonary nodules, including:

--7 mm nodule in the posterior right upper lobe (series 8/image 23),
max SUV

--8 mm nodule in the anterior right upper lobe (series 8/image 28),
max SUV

--6 mm nodule in the central left upper lobe (series 8/image 32),
max SUV

--10 mm nodule in the inferior left upper lobe/lingula (series
8/image 43), max SUV

--7 mm nodule in the inferior right middle lobe (series 8/image 46),
max SUV

No hypermetabolic thoracic lymphadenopathy.

Incidental CT findings: Atherosclerotic calcifications of the aortic
arch.

ABDOMEN/PELVIS: No abnormal hypermetabolism in the liver, spleen,
pancreas, or adrenal glands.

No hypermetabolic abdominopelvic lymphadenopathy.

Incidental CT findings: Atherosclerotic calcifications of the
abdominal aorta and branch vessels.

SKELETON: No focal hypermetabolic activity to suggest skeletal
metastasis.

Incidental CT findings: Mild degenerative changes of the visualized
thoracolumbar spine.

11 mm subcutaneous lesion in the right gluteal region (series
4/image 119), max SUV 4.5. This is nonspecific but favors a
subcutaneous injection site.
IMPRESSION: 12 x 9 mm cutaneous/subcutaneous lesion along the left anterior
shoulder/axilla, demonstrating marked hypermetabolism, suspicious
for primary cutaneous neoplasm such as melanoma. Direct inspection
is suggested.

Multiple bilateral pulmonary nodules measuring up to 10 mm in the
inferior left upper lobe/lingula, suspicious for metastases.

Subcutaneous lesion in the right gluteal region favors a
subcutaneous injection site.

## 2021-06-02 NOTE — Telephone Encounter (Signed)
ATC pt. VM box is full. WCB.  

## 2021-06-07 NOTE — Telephone Encounter (Signed)
Lmtcb for pt.  

## 2021-06-08 ENCOUNTER — Encounter: Payer: Self-pay | Admitting: Physical Medicine and Rehabilitation

## 2021-06-08 NOTE — Telephone Encounter (Signed)
Will close encounter as we have attempted to reach pt several times without a return call.

## 2021-06-10 ENCOUNTER — Telehealth: Payer: Self-pay | Admitting: Pulmonary Disease

## 2021-06-10 NOTE — Telephone Encounter (Signed)
Checked boxes upfront. Still no fax has been received as of yet. Will await fax.

## 2021-06-10 NOTE — Telephone Encounter (Signed)
Correction...  Patient will be using [facility] and they will be faxing over documentation to verify that PM did refer patient to Dermatology and Skin Surgery in [place], [place].

## 2021-06-14 ENCOUNTER — Telehealth: Payer: Self-pay | Admitting: Pulmonary Disease

## 2021-06-14 NOTE — Telephone Encounter (Signed)
Will forward to triage to look for fax.

## 2021-06-14 NOTE — Telephone Encounter (Signed)
Transportation form was in Dr. Matilde Bash A pod box. Dr. Vaughan Browner is out of office until 06/27/21. Transportation form completed and Dr. Vaughan Browner stamp placed.  Transportation form faxed to [employer] [phone]. Confirmation received.  ATC Patient.  LM on VM (DPR), that form was faxed to [facility]. Nothing further at this time.

## 2021-06-14 NOTE — Telephone Encounter (Signed)
Pcc's  do not deal with the insurance when it comes to DME this should go through to the DME company

## 2021-06-14 NOTE — Telephone Encounter (Signed)
Called was received from [facility] about POC  order. POC order was placed 03/24/21 and sent to [place]. ATC Patient.  I did leave a detailed message on VM (DPR), that order was placed with [facility], and I would have PCC's in office help.   Message routed to [facility] to assist

## 2021-06-14 NOTE — Telephone Encounter (Signed)
Patient had called [employer] asking about receiving a POC, because she never received one. Order was placed 03/24/21 and sent to [place]. Called [facility] to follow up on why POC order was not completed. Called [facility] and was told she was a APS/[place] Patient. Called APS and spoke with [place]. [place] stated Patient has not been ordering oxygen regularly and per insurance Patient must order  oxygen tanks regularly for 3 months, before she can be assessed for POC. [place] stated Patient could get smaller oxygen tanks that are lighter then POC's. Called Patient and left detailed message on VM (DPR), letting her know why she has not received POC, what she needed to do, info on smaller oxygen tanks, [facility] contact number, and [facility] contact number.

## 2021-06-22 ENCOUNTER — Telehealth: Payer: Self-pay | Admitting: Pulmonary Disease

## 2021-06-22 NOTE — Telephone Encounter (Signed)
ATC, left VM. 

## 2021-06-23 NOTE — Telephone Encounter (Signed)
Lmtcb for pt.  

## 2021-06-24 NOTE — Telephone Encounter (Signed)
Patient called regarding getting a POC.  She states that she has been getting the run around from [place] for 3 years trying to get rid of the heavy tanks and get a POC.  She cannot carry the tanks and her nurses are not paid to carry the tanks.  She says [facility] told her that she has not ordered oxygen in 3 months and that is not true.  She says she stays at home most of the time on her home concentrator, she only goes out to physician appointments and when she gets to the doctors office, she uses their tank while she is there to conserve hers so she does not run out.  At one time she was down to 1 tank and does not want that to happen to her again.  She uses between 2-3 liters depending on how sob she is at that particular time.  I advised her I would call [employer] and speak to them and see what I could find out from them and then call her back with an update.   Called and had to leave a VM for [facility] regarding the POC issues.  Asked that he call the main number and ask for the triage nurse.  Will await his return call.

## 2021-06-28 NOTE — Telephone Encounter (Signed)
Spoke with the pt and notified we called Ashly with [employer] and are awaiting his call back. She verbalized understanding and is aware she will be updated once we speak with [facility].

## 2021-07-01 ENCOUNTER — Telehealth: Payer: Self-pay | Admitting: Pulmonary Disease

## 2021-07-01 NOTE — Telephone Encounter (Signed)
Call made to [facility], made aware there is a Producer, television/film/video. Contact info for [employer] given. Aware she can call anytime for a update. Voiced understanding.   Nothing further needed at this time.

## 2021-07-04 ENCOUNTER — Telehealth: Payer: Self-pay | Admitting: Pulmonary Disease

## 2021-07-04 DIAGNOSIS — R911 Solitary pulmonary nodule: Secondary | ICD-10-CM

## 2021-07-04 NOTE — Telephone Encounter (Signed)
Patient is returning phone call. Patient phone number is [phone] or call Nicholes Mango nurse [phone].

## 2021-07-04 NOTE — Telephone Encounter (Signed)
Called [facility], spoke with Suzette. Jill Spence stated Patient was seen in office 06/15/21.  Requested notes will be faxed for Dr. Vaughan Browner to review, once provider  has finished all notes, and labs, test results received. [facility] main fax number given.

## 2021-07-04 NOTE — Telephone Encounter (Signed)
I called Jill Spence and patient contact Jill Spence several time to make sure the dermatology appointment was completed but it went to voice mail. Left a message requesting call back to the office.  Lattie Haw- Can you contact the [facility] Dermatology office to get records

## 2021-07-04 NOTE — Telephone Encounter (Signed)
Called patient but call went straight to VM. I left a message for her to call us back.

## 2021-07-04 NOTE — Telephone Encounter (Signed)
ATC Patient.  Left detailed message on VM (DPR), stating Dr. Vaughan Browner was calling to make sure Patient was seen by Dermatology.   LM for Patient that I had called and requested OV notes, any labs,or  test from Dermatology OV for Dr. Vaughan Browner. Call back info left on VM for Patient to call with any questions or concerns.  Per 07/04/21 Dr. Vaughan Browner-  I called Ms Neitzke and patient contact Nicholes Mango several time to make sure the dermatology appointment was completed but it went to voice mail. Left a message requesting call back to the office.   Lattie Haw- Can you contact the [facility] Dermatology office to get records  07/04/21-Williette Loewe, LPN Called [facility], spoke with Suzette. Isa Rankin stated Patient was seen in office 06/15/21. Requested notes will be faxed for Dr. Vaughan Browner to review, once provider  has finished all notes, and labs, test results received. [facility] main fax number given.

## 2021-07-06 ENCOUNTER — Telehealth: Payer: Self-pay | Admitting: Pulmonary Disease

## 2021-07-07 NOTE — Telephone Encounter (Signed)
See phone note from 7/20. Jill Spence and she is requesting POC for pt. I updated her on the information from the phone note from 7/20. Please refer to that phone note for more info. Will close this encounter.

## 2021-07-07 NOTE — Telephone Encounter (Signed)
Called and spoke to [place] with [facility] in [place]. She states the POC units the pt was wanting are heavier than her current portable tanks. Lorie states she will look into this further and call us back with more information. The POC order was placed back in April 2022. Phone note from 7/12 indicates the pt must use her portable tanks more frequently. I advised Lorie that the pt wanst using her tanks to go out as they are inconvenient for her thus the request for a POC. Lorie will call back. Will await her call.

## 2021-07-15 ENCOUNTER — Telehealth: Payer: Self-pay | Admitting: Pulmonary Disease

## 2021-07-15 NOTE — Telephone Encounter (Signed)
July 07, 2021 Collier Salina, RN    Note Called and spoke to Fox with [facility] in [place]. She states the POC units the pt was wanting are heavier than her current portable tanks. Lorie states she will look into this further and call us back with more information. The POC order was placed back in April 2022. Phone note from 7/12 indicates the pt must use her portable tanks more frequently. I advised Lorie that the pt wanst using her tanks to go out as they are inconvenient for her thus the request for a POC. Lorie will call back. Will await her call.     Called and spoke with Becky Sax, NP with [facility] letting her know the recent update we had received from [place] about pt's POC.  Becky Sax stated that she went to visit with pt and pt was not using her oxygen and has been pretty much just sitting around expecting everyone to do everything for her instead of her getting up and moving around. Becky Sax stated to pt that she was going to have to start moving around.  Sonja wanted to know how many liters pt was supposed to be wearing and I stated that to her as well. Stated to [facility] that pt is due for an appt as she was supposed to have followed up with [place] July 2022 but no appt was made.  Becky Sax stated that we should call Craig Guess at phone number [phone] to get an appt scheduled for pt. Attempted to call Verdis Frederickson but unable to reach. Left message for her to return call.

## 2021-07-19 NOTE — Telephone Encounter (Signed)
ATC [facility] and was on hold for more than 15 min.   Lmtcb for [facility].

## 2021-07-26 ENCOUNTER — Other Ambulatory Visit: Payer: Self-pay

## 2021-07-26 ENCOUNTER — Ambulatory Visit (INDEPENDENT_AMBULATORY_CARE_PROVIDER_SITE_OTHER): Payer: 59 | Admitting: Endocrinology

## 2021-07-26 VITALS — BP 124/80 | HR 81 | Ht 63.0 in | Wt 168.0 lb

## 2021-07-26 DIAGNOSIS — E876 Hypokalemia: Secondary | ICD-10-CM | POA: Diagnosis not present

## 2021-07-26 DIAGNOSIS — E0590 Thyrotoxicosis, unspecified without thyrotoxic crisis or storm: Secondary | ICD-10-CM

## 2021-07-26 DIAGNOSIS — E559 Vitamin D deficiency, unspecified: Secondary | ICD-10-CM | POA: Diagnosis not present

## 2021-07-26 LAB — BASIC METABOLIC PANEL
BUN: 17 mg/dL (ref 6–23)
CO2: 29 mEq/L (ref 19–32)
Calcium: 9.7 mg/dL (ref 8.4–10.5)
Chloride: 103 mEq/L (ref 96–112)
Creatinine, Ser: 0.79 mg/dL (ref 0.40–1.20)
GFR: 76.13 mL/min (ref >60.00)
Glucose, Bld: 88 mg/dL (ref 70–99)
Potassium: 4.4 mEq/L (ref 3.5–5.1)
Sodium: 139 mEq/L (ref 135–145)

## 2021-07-26 LAB — VITAMIN D 25 HYDROXY (VIT D DEFICIENCY, FRACTURES): VITD: 57.48 ng/mL (ref 30.00–100.00)

## 2021-07-26 LAB — TSH: TSH: 0.7 u[IU]/mL (ref 0.35–5.50)

## 2021-07-26 LAB — T4, FREE: Free T4: 0.57 ng/dL — ABNORMAL LOW (ref 0.60–1.60)

## 2021-07-26 MED ORDER — METHIMAZOLE 5 MG PO TABS: 5.0000 mg | ORAL_TABLET | Freq: Every day | ORAL | 3 refills | Status: DC

## 2021-07-26 NOTE — Progress Notes (Signed)
Subjective:    Patient ID: Jill Spence, female    DOB: 1951-03-13, 70 y.o.   MRN: [id number]  HPI Pt returns for f/u of hyperthyroidism (dx'ed 2021; 2021 US showed MNG; She cannot be isolated for RAI; pt lives alone, but has Water quality scientist; US showed LLP nodule, 2.9 cm (labeled 5) which meets criteria for bx, but nuc med scan showed LLP nodule was hyperfunctioning).  Pt reports muscle cramps.   She takes Vit-D 50,000 units/week.   Past Medical History:  Diagnosis Date   Anemia    low iron   Anxiety    Arthritis    Bipolar disorder (HCC)    COPD (chronic obstructive pulmonary disease) (HCC)    Family history of adverse reaction to anesthesia    mom "was put to sleep and she never woke up" -    GERD (gastroesophageal reflux disease)    Guillain Barr syndrome (HCC)    numbness in toes, legs hurt   Headache    migraines as a teenager   IBS (irritable bowel syndrome)    Lump, breast    removed years ago per Pt.   Pneumonia    Restless leg syndrome    Seizures ([place])    only has one when she gets upset, has "quiet' seizures    Past Surgical History:  Procedure Laterality Date   BREAST SURGERY     COLONOSCOPY     EYE SURGERY Bilateral    cataract surgery with lens implants   MANDIBLE RECONSTRUCTION     MINOR REMOVAL OF MANDIBULAR HARDWARE Right 10/15/2015   Procedure: MINOR REMOVAL OF Right MANDIBULAR HARDWARE;  Surgeon: Diona Browner, DDS;  Location: [facility];  Service: Oral Surgery;  Laterality: Right;   TONSILLECTOMY      Social History   Socioeconomic History   Marital status: Widowed    Spouse name: Not on file   Number of children: Not on file   Years of education: Not on file   Highest education level: Not on file  Occupational History   Not on file  Tobacco Use   Smoking status: Former    Packs/day: 0.25    Years: 6.00    Pack years: 1.50    Types: Cigarettes    Quit date: 10/14/2007    Years since quitting: 13.7   Smokeless tobacco: Never   Substance and Sexual Activity   Alcohol use: No   Drug use: No   Sexual activity: Not on file  Other Topics Concern   Not on file  Social History Narrative   Not on file   Social Determinants of Health   Financial Resource Strain: Not on file  Food Insecurity: Not on file  Transportation Needs: Not on file  Physical Activity: Not on file  Stress: Not on file  Social Connections: Not on file  Intimate Partner Violence: Not on file    Current Outpatient Medications on File Prior to Visit  Medication Sig Dispense Refill   albuterol (PROVENTIL HFA;VENTOLIN HFA) 108 (90 BASE) MCG/ACT inhaler Inhale 2 puffs into the lungs every 6 (six) hours as needed for wheezing.     albuterol (PROVENTIL) (2.5 MG/3ML) 0.083% nebulizer solution Take 2.5 mg by nebulization every 6 (six) hours as needed for wheezing.     alendronate (FOSAMAX) 70 MG tablet Take 70 mg by mouth every Sunday. Take with a full glass of water on an empty stomach.     ARIPiprazole (ABILIFY) 15 MG tablet Take 15 mg by mouth  daily.     benzonatate (TESSALON) 200 MG capsule Take 200 mg by mouth 3 (three) times daily as needed for cough.     benztropine (COGENTIN) 0.5 MG tablet Take 0.5 mg by mouth 2 (two) times daily.     cetirizine (ZYRTEC) 10 MG tablet Take 10 mg by mouth daily.     cromolyn (OPTICROM) 4 % ophthalmic solution Place 1 drop into both eyes 4 times daily.     famciclovir (FAMVIR) 250 MG tablet Take 250 mg by mouth 2 (two) times daily.     Fluticasone-Umeclidin-Vilant (TRELEGY ELLIPTA) 100-62.5-25 MCG/INH AEPB Inhale into the lungs.     gabapentin (NEURONTIN) 300 MG capsule Take 600 mg by mouth 2 (two) times daily.     hydrOXYzine (ATARAX/VISTARIL) 25 MG tablet Take 25 mg by mouth 2 (two) times daily.     ipratropium-albuterol (DUONEB) 0.5-2.5 (3) MG/3ML SOLN Take 3 mLs by nebulization every 4 (four) hours as needed. 360 mL 5   magnesium oxide (MAG-OX) 400 MG tablet Take 250 mg by mouth daily.     oxcarbazepine  (TRILEPTAL) 600 MG tablet Take 600 mg by mouth 2 (two) times daily.     oxyCODONE-acetaminophen (PERCOCET) 5-325 MG tablet Take 1-2 tablets by mouth every 4 (four) hours as needed. 30 tablet 0   predniSONE (DELTASONE) 10 MG tablet Take 10 mg by mouth daily with breakfast.     ranitidine (ZANTAC) 300 MG capsule Take 300 mg by mouth every evening.     traZODone (DESYREL) 50 MG tablet Take 50 mg by mouth at bedtime.     Vitamin D, Ergocalciferol, (DRISDOL) 50000 UNITS CAPS capsule Take 50,000 Units by mouth every Sunday.      potassium chloride SA (KLOR-CON) 20 MEQ tablet Take 1 tablet (20 mEq total) by mouth 2 (two) times daily for 7 days. 14 tablet 0   No current facility-administered medications on file prior to visit.    Allergies  Allergen Reactions   Aspirin    Influenza Vaccines     Family History  Problem Relation Age of Onset   Cancer Father     BP 124/80 (BP Location: Right Arm, Patient Position: Sitting, Cuff Size: Normal)   Pulse 81   Ht '5\' 3"'$  (1.6 m)   Wt 168 lb (76.2 kg)   SpO2 93%   BMI 29.76 kg/m    Review of Systems Denies fever.  She has intermitt diarrhea.      Objective:   Physical Exam VITAL SIGNS:  See vs page.  GENERAL: no distress.  Has 02 on.   NECK: 2-3 cm left thyroid nodule is again palpable.  No palpable lymphadenopathy at the anterior neck.   Lab Results  Component Value Date   TSH 0.70 07/26/2021      Assessment & Plan:  MNG: clinically stable.   Hyperthyroidism: well-controlled.  Please continue the same methimazole.  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   Patient Instructions  Blood tests are requested for you today.  We'll let you know about the results.     If ever you have fever while taking methimazole, stop it and call us, even if the reason is obvious, because of the risk of a rare side-effect.   It is best to never miss the medication.  However, if you do miss it, next best is to double up the next time.   We'll plan to recheck the  ultrasound later this year.   Please come back for a follow-up appointment in 2-3  months.

## 2021-07-26 NOTE — Patient Instructions (Signed)
Blood tests are requested for you today.  We'll let you know about the results.     If ever you have fever while taking methimazole, stop it and call us, even if the reason is obvious, because of the risk of a rare side-effect.   It is best to never miss the medication.  However, if you do miss it, next best is to double up the next time.   We'll plan to recheck the ultrasound later this year.   Please come back for a follow-up appointment in 2-3 months.

## 2021-07-27 LAB — PTH, INTACT AND CALCIUM
Calcium: 9.7 mg/dL (ref 8.6–10.4)
PTH: 38 pg/mL (ref 16–77)

## 2021-07-27 NOTE — Telephone Encounter (Signed)
I reviewed her dermatology assessment and clinic note dated 06/15/21 Examination of area of interest on left shoulder felt to be scar versus dermatofibroma They have requested the PET scan report and have not received it yet  Lisa-can you please fax the PET scan report 04/29/21 to [facility] dermatology to the attention of [facility], fax number [phone]  Can you please also order follow-up super D CT of chest for reevaluation of lung nodules.  Marshell Garfinkel MD [facility] 07/27/2021, 1:49 PM

## 2021-07-27 NOTE — Telephone Encounter (Signed)
Super D CT order placed to reevaluate lung nodules.  Pet scan report faxed to [facility] attention [facility], [place]. Confirmation received.  Nothing further at this time.

## 2021-07-27 NOTE — Addendum Note (Signed)
Addended by: Elton Sin on: 07/27/2021 02:38 PM   Modules accepted: Orders

## 2021-07-29 NOTE — Telephone Encounter (Signed)
Called and spoke with Anderson Malta at [place] to see if there was any updates about the patient getting POC. She stated that patient does not meet requirements right now to get POC. She states that in order to meet requirements the patient has to be using the tanks consistently for 3 months and also ordering them consistently for 3 months to prove to insurance that she is using the oxygen and that she would benefit from portable oxygen.  Patient is also scheduled for appointment with Dr.Mannam  Next Appt With Pulmonology [facility], MD)09/06/2021 at  2:00 PM  Nothing further needed at this time.

## 2021-08-02 ENCOUNTER — Ambulatory Visit (HOSPITAL_BASED_OUTPATIENT_CLINIC_OR_DEPARTMENT_OTHER)
Admission: RE | Admit: 2021-08-02 | Discharge: 2021-08-02 | Disposition: A | Discharge status: Home / Self Care | Payer: 59 | Source: Ambulatory Visit | Attending: Pulmonary Disease | Admitting: Pulmonary Disease

## 2021-08-02 ENCOUNTER — Other Ambulatory Visit: Payer: Self-pay

## 2021-08-02 DIAGNOSIS — R911 Solitary pulmonary nodule: Secondary | ICD-10-CM | POA: Diagnosis not present

## 2021-08-05 ENCOUNTER — Other Ambulatory Visit: Payer: Self-pay | Admitting: Endocrinology

## 2021-08-05 NOTE — Telephone Encounter (Signed)
  Notes to clinic:  Patient was seen on 07/26/2021 Medication were filled by a historical provider Please advise for refills   Requested Prescriptions  Pending Prescriptions Disp Refills   famciclovir (FAMVIR) 250 MG tablet [Pharmacy Med Name: famciclovir 250 mg tablet] 180 tablet 2    Sig: TAKE ONE TABLET BY MOUTH TWICE DAILY     There is no refill protocol information for this order     cetirizine (ZYRTEC) 10 MG tablet [Pharmacy Med Name: cetirizine 10 mg tablet] 90 tablet 0    Sig: TAKE ONE TABLET BY MOUTH DAILY     There is no refill protocol information for this order

## 2021-08-17 ENCOUNTER — Telehealth: Payer: Self-pay | Admitting: Pulmonary Disease

## 2021-08-17 NOTE — Telephone Encounter (Signed)
Called and left results on VM per Patient request.  Call back number and next appointment information given.  Nothing further at this time.

## 2021-08-17 NOTE — Telephone Encounter (Signed)
Patient came to office today.  CT results given.  Understanding stated.  Patient stated she was ordered a POC through [facility] and was told [facility] did not have a POC for her. Patient stated [employer] had told her [employer] had her a POC available.  Patient stated [facility] needed POC order placed 03/24/21. Patient stated POC is needed before her 09/02/21 surgery.   PCC's can you assist in sending POC order to [facility]

## 2021-08-18 NOTE — Telephone Encounter (Signed)
New order will need to be placed & patient will need to be re-qualified.  Depending on how long pt has been with [facility], patient may be to pay out of the pocket for the POC if not covered by pt's insurance.

## 2021-08-19 NOTE — Telephone Encounter (Signed)
ATC, left VM. 

## 2021-08-19 NOTE — Telephone Encounter (Signed)
Call made to patient spoke with Jill Spence), made aware patient will need to be re-qualified and this can be addressed at [facility]. Voiced understanding.   Nothing further needed at this time.

## 2021-09-06 ENCOUNTER — Encounter: Payer: Self-pay | Admitting: Pulmonary Disease

## 2021-09-06 ENCOUNTER — Other Ambulatory Visit: Payer: Self-pay

## 2021-09-06 ENCOUNTER — Ambulatory Visit (INDEPENDENT_AMBULATORY_CARE_PROVIDER_SITE_OTHER): Payer: 59 | Admitting: Pulmonary Disease

## 2021-09-06 VITALS — BP 126/74 | HR 65 | Temp 97.9°F | Ht 63.0 in | Wt 172.6 lb

## 2021-09-06 DIAGNOSIS — C4490 Unspecified malignant neoplasm of skin, unspecified: Secondary | ICD-10-CM | POA: Diagnosis not present

## 2021-09-06 DIAGNOSIS — R911 Solitary pulmonary nodule: Secondary | ICD-10-CM | POA: Diagnosis not present

## 2021-09-06 DIAGNOSIS — R9389 Abnormal findings on diagnostic imaging of other specified body structures: Secondary | ICD-10-CM

## 2021-09-06 DIAGNOSIS — J449 Chronic obstructive pulmonary disease, unspecified: Secondary | ICD-10-CM

## 2021-09-06 DIAGNOSIS — J439 Emphysema, unspecified: Secondary | ICD-10-CM

## 2021-09-06 MED ORDER — AZITHROMYCIN 250 MG PO TABS: ORAL_TABLET | ORAL | 0 refills | Status: DC

## 2021-09-06 MED ORDER — PREDNISONE 20 MG PO TABS: ORAL_TABLET | ORAL | 0 refills | Status: DC

## 2021-09-06 NOTE — Progress Notes (Signed)
Jill Spence    [id number]    [birth year]  Primary Care Physician:Clark, Nettie Elm, MD  Referring Physician: Marrian Salvage, [address] [place],  [place] [address]  Chief complaint: Follow-up for COPD  HPI: 70 year old with history of COPD on supplemental oxygen, lung nodules.  Previously followed at [facility] Maintained on [place] and now Trelegy inhaler.  She has had recurrent exacerbations over the past year requiring prednisone, antibiotics. She is also being followed with serial CT scans for lung nodule  States that she was hospitalized for COVID-19 at [facility] in early 2021 but I do not have any record of those. Diagnosed with multinodular goiter and hypothyroidism in 2022.  She is following with Dr. Loanne Drilling with radiotherapy plan.  Chief complaint is dyspnea on exertion, chronic cough with mucus production.  Pets: Has a dog Occupation: Retired Recruitment consultant Exposures: Reports mold in the previous apartment.  No current mold exposure.  No hot tub, Jacuzzi Smoking history: States that she quit smoking in 2008.  Cannot give me a clear idea of how much she smoked prior Travel history: No significant travel history Relevant family history: No significant family history of lung disease  Interim history: She had a CT chest and PET scan for evaluation of lung nodules PET scan showed hypermetabolic them in the right axilla.  She has been seen at dermatology and this lesion excised.  I have reviewed the previous notes from dermatology As per the patient and the final biopsy report from September 15 is benign keloid  She also had a follow-up CT chest which showed improvement in lung nodule  Dyspnea stable.  Reports cough with greenish mucus, increased congestion.  Outpatient Encounter Medications as of 09/06/2021  Medication Sig   albuterol (PROVENTIL HFA;VENTOLIN HFA) 108 (90 BASE) MCG/ACT inhaler Inhale 2 puffs into the  lungs every 6 (six) hours as needed for wheezing.   albuterol (PROVENTIL) (2.5 MG/3ML) 0.083% nebulizer solution Take 2.5 mg by nebulization every 6 (six) hours as needed for wheezing.   alendronate (FOSAMAX) 70 MG tablet Take 70 mg by mouth every Sunday. Take with a full glass of water on an empty stomach.   ARIPiprazole (ABILIFY) 15 MG tablet Take 15 mg by mouth daily.   benzonatate (TESSALON) 200 MG capsule Take 200 mg by mouth 3 (three) times daily as needed for cough.   benztropine (COGENTIN) 0.5 MG tablet Take 0.5 mg by mouth 2 (two) times daily.   cetirizine (ZYRTEC) 10 MG tablet Take 10 mg by mouth daily.   cromolyn (OPTICROM) 4 % ophthalmic solution Place 1 drop into both eyes 4 times daily.   famciclovir (FAMVIR) 250 MG tablet Take 250 mg by mouth 2 (two) times daily.   Fluticasone-Umeclidin-Vilant (TRELEGY ELLIPTA) 100-62.5-25 MCG/INH AEPB Inhale into the lungs.   gabapentin (NEURONTIN) 300 MG capsule Take 600 mg by mouth 2 (two) times daily.   hydrOXYzine (ATARAX/VISTARIL) 25 MG tablet Take 25 mg by mouth 2 (two) times daily.   ipratropium-albuterol (DUONEB) 0.5-2.5 (3) MG/3ML SOLN Take 3 mLs by nebulization every 4 (four) hours as needed.   magnesium oxide (MAG-OX) 400 MG tablet Take 250 mg by mouth daily.   methimazole (TAPAZOLE) 5 MG tablet Take 1 tablet (5 mg total) by mouth daily.   oxcarbazepine (TRILEPTAL) 600 MG tablet Take 600 mg by mouth 2 (two) times daily.   oxyCODONE-acetaminophen (PERCOCET) 5-325 MG tablet Take 1-2 tablets by  mouth every 4 (four) hours as needed.   predniSONE (DELTASONE) 10 MG tablet Take 10 mg by mouth daily with breakfast.   ranitidine (ZANTAC) 300 MG capsule Take 300 mg by mouth every evening.   traZODone (DESYREL) 50 MG tablet Take 50 mg by mouth at bedtime.   Vitamin D, Ergocalciferol, (DRISDOL) 50000 UNITS CAPS capsule Take 50,000 Units by mouth every Sunday.    potassium chloride SA (KLOR-CON) 20 MEQ tablet Take 1 tablet (20 mEq total) by mouth 2  (two) times daily for 7 days.   No facility-administered encounter medications on file as of 09/06/2021.   Physical Exam: Blood pressure 126/74, pulse 65, temperature 97.9 F (36.6 C), temperature source Oral, height 5\' 3"  (1.6 m), weight 172 lb 9.6 oz (78.3 kg), SpO2 95 %. Gen:      No acute distress HEENT:  EOMI, sclera anicteric Neck:     No masses; no thyromegaly Lungs:    Clear to auscultation bilaterally; normal respiratory effort CV:         Regular rate and rhythm; no murmurs Abd:      + bowel sounds; soft, non-tender; no palpable masses, no distension Ext:    No edema; adequate peripheral perfusion Skin:      Warm and dry; no rash Neuro: alert and oriented x 3 Psych: normal mood and affect   Data Reviewed: Imaging: CT chest 03/29/2020-cirrhosis, thyroid nodule, severe emphysema  A new 4 mm nodule is seen along the minor fissure (3/61). 6 mm nodule in the lateral  segment right middle lobe (3/69) has enlarged from 3 mm on  01/01/2019. 2 mm anterior right lower lobe nodule (3/76) is new.  Scarring in the apex of the left upper lobe with a new nodular  component measuring 6 mm (3/12). 3 mm lingular nodule (3/63) is new.  Additional tiny pulmonary nodules in the left lung are stable. No  pleural fluid. Airway is unremarkable.   CT chest 07/14/2020-new 5 mm nodule in the right middle lobe.  Remainder of the lung nodules are stable.  Enlarged pulmonary trunk  CT chest 10/20/2020-stable right middle lobe nodule, thyroid nodule, aortic atherosclerosis, emphysema  CT chest 04/14/2021-interval development of pulmonary nodules  PET scan 04/30/2019 2-12 mm lesion along the left anterior shoulder/axilla.  Multiple bilateral nodules up to 10 mm.  Super D CT 08/02/2021-recently noted pulm nodules are stable to decreased in size. I have reviewed the images personally.  Labs: CBC 07/27/2020-WBC 7.3, eos 1%, absolute eosinophil count 73 IgE 07/27/2020-5  Alpha-1 antitrypsin 08/28/2028-170,  PIMM  Assessment:  Severe COPD with exacerbations Currently on Trelegy inhaler. Duo nebs, nebulizer  She has mild episode of bronchitis today.  Treat with Z-Pak and prednisone 40 mg a day for 5 days Mucinex, flutter valve for mucociliary clearance She is continuing with physical therapy.  Declined pulmonary rehab.  Pulmonary nodules We are following this closely.  Follow-up CT from 08/02/2021 does show some improvement in size which is reassuring. Order follow-up chest in 6 months  Abnormal PET scan PET scan showed hypermetabolic lesion along the left shoulder/axilla.  She had a skin lesion which was excised by dermatology and is reportedly a benign keloid.  I will get the biopsy report from her dermatology for our records.  Records from dermatology office visit reviewed in detail  Plan/Recommendations: Continue Trelegy Z-Pak, prednisone Follow-up CT in 6 months Obtain records from dermatology   Marshell Garfinkel MD [facility] 09/06/2021, 2:19 PM  CC: Jodi Mourning  Jasmine December,*

## 2021-09-06 NOTE — Patient Instructions (Signed)
Glad you are doing well with your breathing CT scan shows improvement in lung nodules which is good news Order follow-up CT without contrast in 1 year Continue Trelegy inhaler We will give you a Z-Pak and prednisone 40 mg a day for 5 days We will get records from your dermatology office  Follow-up in 6 months.

## 2021-09-08 ENCOUNTER — Telehealth: Payer: Self-pay | Admitting: Pulmonary Disease

## 2021-09-08 NOTE — Telephone Encounter (Signed)
I have called Paris with [facility] and she is aware that the POC order has been faxed to [facility] today.  Nothing further is needed.

## 2021-09-09 ENCOUNTER — Telehealth: Payer: Self-pay | Admitting: Pulmonary Disease

## 2021-09-09 NOTE — Addendum Note (Signed)
Addended by: Elton Sin on: 09/09/2021 12:17 PM   Modules accepted: Orders

## 2021-09-09 NOTE — Telephone Encounter (Signed)
ATC x1 LVMTCB on 09/12/21

## 2021-09-12 NOTE — Telephone Encounter (Signed)
I have sent this order on the 6th and today

## 2021-09-12 NOTE — Telephone Encounter (Signed)
Called patient but she did not answer. Left message for patient to call back.  

## 2021-09-12 NOTE — Telephone Encounter (Signed)
PCC's please advise.  I see the order and it looks like it was faxed.  They keep telling the pt that they do not have the order.  thanks

## 2021-09-12 NOTE — Telephone Encounter (Signed)
I have received notification that from [facility] that they have this order

## 2021-09-13 NOTE — Telephone Encounter (Signed)
Called patient back but it went straight to VM. Will attempt to call back later today.

## 2021-09-15 ENCOUNTER — Telehealth: Payer: Self-pay | Admitting: Pulmonary Disease

## 2021-09-15 NOTE — Telephone Encounter (Signed)
I called and spoke with the pt and notified of response per Vallarie Mare. Nothing further needed.

## 2021-09-15 NOTE — Telephone Encounter (Signed)
I have received this message back from [facility] they are going to call her.  Manfred Arch, Sandrea Hammond; Nash Shearer I'll call her!        Previous Messages   ----- Message -----  From: Harland German  Sent: 09/15/2021   9:11 AM EDT  To: Darlina Guys, Stephannie Peters, *  Subject: POC                                             May 28, 1951  This patient is calling again she has called [facility] they keep telling her that they do not have the order can someone please call the patient and let her know that yall have the order and you are working on it. Thanks

## 2021-09-15 NOTE — Telephone Encounter (Signed)
This order has been sent like 3 times I have sent a message to [facility] to call the patient and let her know that they have this and are working on it

## 2021-09-16 ENCOUNTER — Telehealth: Payer: Self-pay | Admitting: Pulmonary Disease

## 2021-09-19 NOTE — Telephone Encounter (Signed)
Called Jill Spence back but she did not answer. Left a detailed message for her to reach out directly to [facility] since we have sent the order numerous times and the patient is aware.   Will close this encounter.

## 2021-09-19 NOTE — Telephone Encounter (Signed)
Spoke with Jill Spence at [facility]  She says that she called [facility] and they told her that they did not receive our o2 order  Sending to [facility] per protocol

## 2021-09-20 ENCOUNTER — Encounter: Payer: 59 | Admitting: Physical Medicine and Rehabilitation

## 2021-09-20 ENCOUNTER — Telehealth: Payer: Self-pay | Admitting: Pulmonary Disease

## 2021-09-20 NOTE — Telephone Encounter (Signed)
Called and spoke with pt letting her know the info stated by [facility] and while speaking with pt, pt said that she was really wanting to switch from [place] to [facility] for her O2 if she is eligible to switch DMEs. Pt said that she has been with [facility] for at least 6 years.  Stated to pt that I would call [facility] to see if she might be eligible to switch to them for her O2 and so she could then receive a POC and she verbalized understanding.  Attempted to call Melissa from [place] but unable to reach. Left message for her to return call.

## 2021-09-20 NOTE — Telephone Encounter (Signed)
Pt gets her Oxygen from [place] so therefore [facility] can take her on for just a POC since she is not on there service for Oxygen

## 2021-09-20 NOTE — Telephone Encounter (Signed)
This is already being worked on can someone call [facility] and tell them this is handled we just answered a message this morning

## 2021-09-20 NOTE — Telephone Encounter (Signed)
Per Melissa from [facility] they can't take on this patient for a POC unless she pays out of pocket since she has oxygen through [place]

## 2021-09-20 NOTE — Telephone Encounter (Signed)
Melissa returned my call. Stated to her the info from pt. Per Lenna Sciara, if pt has been with [facility] for at least 5 years, she would be eligible to switch to [facility] if she has not recently recertified for her O2 with [facility].  Pt however WILL NOT be eligible for a POC unless she paid out of pocket for it as POCs are offered to pts who are brand new to O2 and pt is currently already on O2.  Attempted to call pt to let her know this info but unable to reach. Left message for pt to return call.  Once we hear from pt, we need to send a community message to [facility] to let her know if pt still wants to switch to [facility] from [facility] for her O2 even though she will not be able to get a POC from [facility] unless she paid out of pocket.

## 2021-09-21 ENCOUNTER — Other Ambulatory Visit: Payer: Self-pay

## 2021-09-21 ENCOUNTER — Ambulatory Visit (INDEPENDENT_AMBULATORY_CARE_PROVIDER_SITE_OTHER): Payer: 59 | Admitting: Endocrinology

## 2021-09-21 VITALS — BP 136/74 | HR 86 | Ht 63.0 in | Wt 171.6 lb

## 2021-09-21 DIAGNOSIS — E559 Vitamin D deficiency, unspecified: Secondary | ICD-10-CM | POA: Diagnosis not present

## 2021-09-21 DIAGNOSIS — E0590 Thyrotoxicosis, unspecified without thyrotoxic crisis or storm: Secondary | ICD-10-CM

## 2021-09-21 LAB — BASIC METABOLIC PANEL
BUN: 12 mg/dL (ref 6–23)
CO2: 28 mEq/L (ref 19–32)
Calcium: 9.4 mg/dL (ref 8.4–10.5)
Chloride: 104 mEq/L (ref 96–112)
Creatinine, Ser: 0.76 mg/dL (ref 0.40–1.20)
GFR: 79.66 mL/min (ref >60.00)
Glucose, Bld: 99 mg/dL (ref 70–99)
Potassium: 4.7 mEq/L (ref 3.5–5.1)
Sodium: 139 mEq/L (ref 135–145)

## 2021-09-21 LAB — TSH: TSH: 0.32 u[IU]/mL — ABNORMAL LOW (ref 0.35–5.50)

## 2021-09-21 LAB — T4, FREE: Free T4: 0.51 ng/dL — ABNORMAL LOW (ref 0.60–1.60)

## 2021-09-21 LAB — VITAMIN D 25 HYDROXY (VIT D DEFICIENCY, FRACTURES): VITD: 78.75 ng/mL (ref 30.00–100.00)

## 2021-09-21 NOTE — Patient Instructions (Addendum)
Blood tests are requested for you today.  We'll let you know about the results.     If ever you have fever while taking methimazole, stop it and call us, even if the reason is obvious, because of the risk of a rare side-effect.   It is best to never miss the medication.  However, if you do miss it, next best is to double up the next time.   We'll plan to recheck the ultrasound later this year.   Please come back for a follow-up appointment in 3 months.

## 2021-09-21 NOTE — Telephone Encounter (Signed)
I have called and Lm oN VM for Darys with [employer].  Nothing further is needed.

## 2021-09-21 NOTE — Progress Notes (Signed)
Subjective:    Patient ID: Jill Spence, female    DOB: July 15, 1951, 70 y.o.   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  muscle cramps persist.  She takes Vit-D 50,000 units/week.  Plan to to also check FSH/LH, to verify pituitary function Past Medical History:  Diagnosis Date   Anemia    low iron   Anxiety    Arthritis    Bipolar disorder (HCC)    COPD (chronic obstructive pulmonary disease) (HCC)    Family history of adverse reaction to anesthesia    mom "was put to sleep and she never woke up" -    GERD (gastroesophageal reflux disease)    Guillain Barr syndrome (HCC)    numbness in toes, legs hurt   Headache    migraines as a teenager   IBS (irritable bowel syndrome)    Lump, breast    removed years ago per Pt.   Pneumonia    Restless leg syndrome    Seizures ([place])    only has one when she gets upset, has "quiet' seizures    Past Surgical History:  Procedure Laterality Date   BREAST SURGERY     COLONOSCOPY     EYE SURGERY Bilateral    cataract surgery with lens implants   MANDIBLE RECONSTRUCTION     MINOR REMOVAL OF MANDIBULAR HARDWARE Right 10/15/2015   Procedure: MINOR REMOVAL OF Right MANDIBULAR HARDWARE;  Surgeon: Diona Browner, DDS;  Location: [facility];  Service: Oral Surgery;  Laterality: Right;   TONSILLECTOMY      Social History   Socioeconomic History   Marital status: Widowed    Spouse name: Not on file   Number of children: Not on file   Years of education: Not on file   Highest education level: Not on file  Occupational History   Not on file  Tobacco Use   Smoking status: Former    Packs/day: 0.25    Years:  6.00    Pack years: 1.50    Types: Cigarettes    Quit date: 10/14/2007    Years since quitting: 13.9   Smokeless tobacco: Never  Substance and Sexual Activity   Alcohol use: No   Drug use: No   Sexual activity: Not on file  Other Topics Concern   Not on file  Social History Narrative   Not on file   Social Determinants of Health   Financial Resource Strain: Not on file  Food Insecurity: Not on file  Transportation Needs: Not on file  Physical Activity: Not on file  Stress: Not on file  Social Connections: Not on file  Intimate Partner Violence: Not on file    Current Outpatient Medications on File Prior to Visit  Medication Sig Dispense Refill   albuterol (PROVENTIL HFA;VENTOLIN HFA) 108 (90 BASE) MCG/ACT inhaler Inhale 2 puffs into the lungs every 6 (six) hours as needed for wheezing.     albuterol (PROVENTIL) (2.5 MG/3ML) 0.083% nebulizer solution Take 2.5 mg by nebulization every 6 (six) hours as needed for wheezing.     alendronate (FOSAMAX) 70 MG tablet Take 70 mg by mouth every Sunday. Take with a  full glass of water on an empty stomach.     ARIPiprazole (ABILIFY) 15 MG tablet Take 15 mg by mouth daily.     benztropine (COGENTIN) 0.5 MG tablet Take 0.5 mg by mouth 2 (two) times daily.     cetirizine (ZYRTEC) 10 MG tablet Take 10 mg by mouth daily.     cromolyn (OPTICROM) 4 % ophthalmic solution Place 1 drop into both eyes 4 times daily.     famciclovir (FAMVIR) 250 MG tablet Take 250 mg by mouth 2 (two) times daily.     Fluticasone-Umeclidin-Vilant (TRELEGY ELLIPTA) 100-62.5-25 MCG/INH AEPB Inhale into the lungs.     gabapentin (NEURONTIN) 300 MG capsule Take 600 mg by mouth 2 (two) times daily.     hydrOXYzine (ATARAX/VISTARIL) 25 MG tablet Take 25 mg by mouth 2 (two) times daily.     ipratropium-albuterol (DUONEB) 0.5-2.5 (3) MG/3ML SOLN Take 3 mLs by nebulization every 4 (four) hours as needed. 360 mL 5   magnesium oxide (MAG-OX) 400 MG tablet Take 250 mg by mouth  daily.     methimazole (TAPAZOLE) 5 MG tablet Take 1 tablet (5 mg total) by mouth daily. 90 tablet 3   oxcarbazepine (TRILEPTAL) 600 MG tablet Take 600 mg by mouth 2 (two) times daily.     oxyCODONE-acetaminophen (PERCOCET) 5-325 MG tablet Take 1-2 tablets by mouth every 4 (four) hours as needed. 30 tablet 0   predniSONE (DELTASONE) 10 MG tablet Take 10 mg by mouth daily with breakfast.     predniSONE (DELTASONE) 20 MG tablet Take 40mg  for 5 days 10 tablet 0   ranitidine (ZANTAC) 300 MG capsule Take 300 mg by mouth every evening.     traZODone (DESYREL) 50 MG tablet Take 50 mg by mouth at bedtime.     potassium chloride SA (KLOR-CON) 20 MEQ tablet Take 1 tablet (20 mEq total) by mouth 2 (two) times daily for 7 days. 14 tablet 0   No current facility-administered medications on file prior to visit.    Allergies  Allergen Reactions   Aspirin    Influenza Vaccines     Family History  Problem Relation Age of Onset   Cancer Father     BP 136/74 (BP Location: Right Arm, Patient Position: Sitting, Cuff Size: Normal)   Pulse 86   Ht 5\' 3"  (1.6 m)   Wt 171 lb 9.6 oz (77.8 kg)   SpO2 92%   BMI 30.40 kg/m    Review of Systems     Objective:   Physical Exam VITAL SIGNS:  See vs page.  GENERAL: no distress.  Has 02 on.   NECK: 2-3 cm left thyroid nodule is again palpable.  No palpable lymphadenopathy at the anterior neck.    25-OH Vit-D=79  Lab Results  Component Value Date   TSH 0.32 (L) 09/21/2021      Assessment & Plan:  MNG: we discussed.  Pt decides to hold off on [place] for now.  Vit-D def.  Overcontrolled.  stop the weekly vitamin-D pills, and start non-prescription vitamin-D, 5000 units per day Hyperthyroidism: just mild now.  As free T4 is also a little low, we'll check gonadotropins next time.

## 2021-09-22 ENCOUNTER — Other Ambulatory Visit: Payer: Self-pay | Admitting: Endocrinology

## 2021-09-22 ENCOUNTER — Telehealth: Payer: Self-pay | Admitting: Pulmonary Disease

## 2021-09-22 DIAGNOSIS — J449 Chronic obstructive pulmonary disease, unspecified: Secondary | ICD-10-CM

## 2021-09-22 DIAGNOSIS — J9611 Chronic respiratory failure with hypoxia: Secondary | ICD-10-CM

## 2021-09-22 LAB — PTH, INTACT AND CALCIUM
Calcium: 9.3 mg/dL (ref 8.6–10.4)
PTH: 54 pg/mL (ref 16–77)

## 2021-09-22 MED ORDER — VITAMIN D3 125 MCG (5000 UT) PO CAPS: 5000.0000 [IU] | ORAL_CAPSULE | Freq: Every day | ORAL | 11 refills | Status: DC

## 2021-09-22 NOTE — Telephone Encounter (Signed)
I called and spoke with Darys from [facility].  She stated that she has spoken with the pt and that the pt is stressed out over using [facility] and wants to change her oxygen to [facility].  She stated that they do not have anyone to contact in an emergency, they keep changing staff and when they tell her they are going to deliver her tanks it is never on the day they tell her and sometimes the tanks that she gets are empty and dont work.  [employer] stated that they just needed it documented as to why the pt wanted to change DME.  PM please advise if you are ok with ordering her oxygen via [facility] now.  thanks

## 2021-09-23 NOTE — Telephone Encounter (Signed)
New order has been placed for the pt to change from [facility] to [facility] for her oxygen per PM>  nothing further is needed.

## 2021-09-23 NOTE — Telephone Encounter (Signed)
Ok to change O2 order to [facility]

## 2021-09-27 ENCOUNTER — Telehealth: Payer: Self-pay | Admitting: Pulmonary Disease

## 2021-09-27 NOTE — Telephone Encounter (Signed)
Spoke with the pt and notified of response per [facility]  She disagreed with this, stating  "that's not what [employer] says" I advised if she has further questions regarding why they denied her POC she can call [facility]  Nothing further needed

## 2021-09-28 NOTE — Telephone Encounter (Signed)
I called Daris back about this paitetn and left a message to call back for further information. Waiting on a call back.

## 2021-10-03 NOTE — Telephone Encounter (Signed)
ATC Daris, LMTCB

## 2021-10-03 NOTE — Telephone Encounter (Signed)
Jill Spence from [facility] is returning phone call. Jill Spence phone number is [phone].

## 2021-10-04 NOTE — Telephone Encounter (Signed)
He needs to be given the number for [facility]- We did not deny her anything, we sent [facility] order for o2. If they have an issue they need to call [facility]. I called Daris back and left detailed msg explaining this, and provided phone number for [facility]. Closing encounter.

## 2021-11-10 ENCOUNTER — Telehealth: Payer: Self-pay | Admitting: Pulmonary Disease

## 2021-11-11 NOTE — Telephone Encounter (Signed)
Left message for Jill Spence to call back.

## 2021-11-15 NOTE — Telephone Encounter (Signed)
Jill Spence is returning phone call. Maria phone number is [phone].

## 2021-11-15 NOTE — Telephone Encounter (Signed)
ATC Verdis Frederickson, [facility]

## 2021-11-16 NOTE — Telephone Encounter (Signed)
Prescriptions for what??? The POC?? ATC Maria and there was no answer and no option to leave VM. According to referral notes, the pt does not qualify to get a POC through [facility].

## 2021-11-18 NOTE — Telephone Encounter (Signed)
ATC Sonji--unable to leave vm due to mailbox being full.

## 2021-11-18 NOTE — Telephone Encounter (Signed)
Due to several unsuccessful attempts to get a hold of Verdis Frederickson will close this encounter. Nothing further needed at this time.

## 2021-12-01 ENCOUNTER — Telehealth: Payer: Self-pay | Admitting: *Deleted

## 2021-12-01 NOTE — Telephone Encounter (Signed)
Craig Guess (DPR) came into the office to get a hard copy of the order for the POC, I clarified that they are using [place].  Verdis Frederickson called the patient and verified this information with Verdis Frederickson.  I printed out the order that had the entirety of the order including the liter flow and it is for a POC and it was written under Dr. Vaughan Browner back in October 2022.  It also had her qualifying information from her walk attached as well.  Verdis Frederickson stated she was going to hand deliver the order to [facility].  Nothing further needed.

## 2021-12-22 ENCOUNTER — Other Ambulatory Visit: Payer: Self-pay

## 2021-12-22 ENCOUNTER — Ambulatory Visit (INDEPENDENT_AMBULATORY_CARE_PROVIDER_SITE_OTHER): Payer: 59 | Admitting: Endocrinology

## 2021-12-22 VITALS — BP 134/90 | HR 68 | Ht 63.0 in | Wt 177.2 lb

## 2021-12-22 DIAGNOSIS — E559 Vitamin D deficiency, unspecified: Secondary | ICD-10-CM

## 2021-12-22 DIAGNOSIS — E0590 Thyrotoxicosis, unspecified without thyrotoxic crisis or storm: Secondary | ICD-10-CM

## 2021-12-22 LAB — LUTEINIZING HORMONE: LH: 25.39 m[IU]/mL

## 2021-12-22 LAB — FOLLICLE STIMULATING HORMONE: FSH: 70.5 m[IU]/mL

## 2021-12-22 LAB — VITAMIN D 25 HYDROXY (VIT D DEFICIENCY, FRACTURES): VITD: 55.92 ng/mL (ref 30.00–100.00)

## 2021-12-22 LAB — T4, FREE: Free T4: 0.56 ng/dL — ABNORMAL LOW (ref 0.60–1.60)

## 2021-12-22 LAB — TSH: TSH: 0.81 u[IU]/mL (ref 0.35–5.50)

## 2021-12-22 NOTE — Patient Instructions (Signed)
Blood tests are requested for you today.  We'll let you know about the results.     If ever you have fever while taking methimazole, stop it and call us, even if the reason is obvious, because of the risk of a rare side-effect.   It is best to never miss the medication.  However, if you do miss it, next best is to double up the next time.   We'll plan to recheck the ultrasound later this year.   Please come back for a follow-up appointment in 3 months.

## 2021-12-22 NOTE — Progress Notes (Signed)
Subjective:    Patient ID: Jill Spence, female    DOB: 08/19/1951, 71 y.o.   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  She takes Vit-D 50,000 units QOW.  Plan to to also check FSH/LH, to verify pituitary function.  Main sxs are fatigue and doe.   Past Medical History:  Diagnosis Date   Anemia    low iron   Anxiety    Arthritis    Bipolar disorder (HCC)    COPD (chronic obstructive pulmonary disease) (HCC)    Family history of adverse reaction to anesthesia    mom "was put to sleep and she never woke up" -    GERD (gastroesophageal reflux disease)    Guillain Barr syndrome (HCC)    numbness in toes, legs hurt   Headache    migraines as a teenager   IBS (irritable bowel syndrome)    Lump, breast    removed years ago per Pt.   Pneumonia    Restless leg syndrome    Seizures ([place])    only has one when she gets upset, has "quiet' seizures    Past Surgical History:  Procedure Laterality Date   BREAST SURGERY     COLONOSCOPY     EYE SURGERY Bilateral    cataract surgery with lens implants   MANDIBLE RECONSTRUCTION     MINOR REMOVAL OF MANDIBULAR HARDWARE Right 10/15/2015   Procedure: MINOR REMOVAL OF Right MANDIBULAR HARDWARE;  Surgeon: Diona Browner, DDS;  Location: [facility];  Service: Oral Surgery;  Laterality: Right;   TONSILLECTOMY      Social History   Socioeconomic History   Marital status: Widowed    Spouse name: Not on file   Number of children: Not on file   Years of education: Not on file   Highest education level: Not on file  Occupational History   Not on file   Tobacco Use   Smoking status: Former    Packs/day: 0.25    Years: 6.00    Pack years: 1.50    Types: Cigarettes    Quit date: 10/14/2007    Years since quitting: 14.2   Smokeless tobacco: Never  Substance and Sexual Activity   Alcohol use: No   Drug use: No   Sexual activity: Not on file  Other Topics Concern   Not on file  Social History Narrative   Not on file   Social Determinants of Health   Financial Resource Strain: Not on file  Food Insecurity: Not on file  Transportation Needs: Not on file  Physical Activity: Not on file  Stress: Not on file  Social Connections: Not on file  Intimate Partner Violence: Not on file    Current Outpatient Medications on File Prior to Visit  Medication Sig Dispense Refill   albuterol (PROVENTIL HFA;VENTOLIN HFA) 108 (90 BASE) MCG/ACT inhaler Inhale 2 puffs into the lungs every 6 (six) hours as needed for wheezing.     albuterol (PROVENTIL) (2.5 MG/3ML) 0.083% nebulizer solution Take 2.5 mg by nebulization every 6 (six) hours as needed for wheezing.     alendronate (  FOSAMAX) 70 MG tablet Take 70 mg by mouth every Sunday. Take with a full glass of water on an empty stomach.     ARIPiprazole (ABILIFY) 15 MG tablet Take 15 mg by mouth daily.     benztropine (COGENTIN) 0.5 MG tablet Take 0.5 mg by mouth 2 (two) times daily.     cetirizine (ZYRTEC) 10 MG tablet Take 10 mg by mouth daily.     Cholecalciferol (VITAMIN D3) 125 MCG (5000 UT) CAPS Take 1 capsule (5,000 Units total) by mouth daily. 30 capsule 11   cromolyn (OPTICROM) 4 % ophthalmic solution Place 1 drop into both eyes 4 times daily.     famciclovir (FAMVIR) 250 MG tablet Take 250 mg by mouth 2 (two) times daily.     Fluticasone-Umeclidin-Vilant (TRELEGY ELLIPTA) 100-62.5-25 MCG/INH AEPB Inhale into the lungs.     gabapentin (NEURONTIN) 300 MG capsule Take 600 mg by mouth 2 (two) times daily.     hydrOXYzine (ATARAX/VISTARIL) 25 MG tablet Take 25 mg by mouth 2 (two) times daily.      ipratropium-albuterol (DUONEB) 0.5-2.5 (3) MG/3ML SOLN Take 3 mLs by nebulization every 4 (four) hours as needed. 360 mL 5   magnesium oxide (MAG-OX) 400 MG tablet Take 250 mg by mouth daily.     methimazole (TAPAZOLE) 5 MG tablet Take 1 tablet (5 mg total) by mouth daily. 90 tablet 3   oxcarbazepine (TRILEPTAL) 600 MG tablet Take 600 mg by mouth 2 (two) times daily.     oxyCODONE-acetaminophen (PERCOCET) 5-325 MG tablet Take 1-2 tablets by mouth every 4 (four) hours as needed. 30 tablet 0   predniSONE (DELTASONE) 10 MG tablet Take 10 mg by mouth daily with breakfast.     predniSONE (DELTASONE) 20 MG tablet Take 40mg  for 5 days 10 tablet 0   ranitidine (ZANTAC) 300 MG capsule Take 300 mg by mouth every evening.     traZODone (DESYREL) 50 MG tablet Take 50 mg by mouth at bedtime.     potassium chloride SA (KLOR-CON) 20 MEQ tablet Take 1 tablet (20 mEq total) by mouth 2 (two) times daily for 7 days. 14 tablet 0   No current facility-administered medications on file prior to visit.    Allergies  Allergen Reactions   Aspirin    Influenza Vaccines     Family History  Problem Relation Age of Onset   Cancer Father     BP 134/90    Pulse 68    Ht 5\' 3"  (1.6 m)    Wt 177 lb 3.2 oz (80.4 kg)    SpO2 96%    BMI 31.39 kg/m     Review of Systems Denies fever    Objective:   Physical Exam VITAL SIGNS:  See vs page.  GENERAL: no distress.  Has 02 on.   NECK: small MNG.  No palpable lymphadenopathy at the anterior neck.     Lab Results  Component Value Date   TSH 0.81 12/22/2021      Assessment & Plan:  Hyperthyroidism: well-controlled.  Please continue the same methimazole MNG: we discussed f/u.  She declines f/u US for now. Vit-D Def: recheck today.  Patient Instructions  Blood tests are requested for you today.  We'll let you know about the results.     If ever you have fever while taking methimazole, stop it and call us, even if the reason is obvious, because of the risk of  a rare side-effect.   It is best to never  miss the medication.  However, if you do miss it, next best is to double up the next time.   We'll plan to recheck the ultrasound later this year.   Please come back for a follow-up appointment in 3 months.

## 2022-01-09 ENCOUNTER — Ambulatory Visit: Payer: 59 | Admitting: Physical Medicine and Rehabilitation

## 2022-02-20 ENCOUNTER — Encounter: Payer: 59 | Attending: Physical Medicine and Rehabilitation | Admitting: Physical Medicine and Rehabilitation

## 2022-02-20 ENCOUNTER — Other Ambulatory Visit: Payer: Self-pay

## 2022-02-20 ENCOUNTER — Telehealth: Payer: Self-pay

## 2022-02-20 VITALS — BP 117/74 | HR 71 | Ht 64.0 in | Wt 168.0 lb

## 2022-02-20 DIAGNOSIS — G894 Chronic pain syndrome: Secondary | ICD-10-CM | POA: Diagnosis present

## 2022-02-20 DIAGNOSIS — Z79891 Long term (current) use of opiate analgesic: Secondary | ICD-10-CM | POA: Insufficient documentation

## 2022-02-20 DIAGNOSIS — Z5181 Encounter for therapeutic drug level monitoring: Secondary | ICD-10-CM | POA: Diagnosis present

## 2022-02-20 DIAGNOSIS — G610 Guillain-Barre syndrome: Secondary | ICD-10-CM | POA: Insufficient documentation

## 2022-02-20 NOTE — Addendum Note (Signed)
Addended by: Jasmine December T on: 02/20/2022 01:57 PM ? ? Modules accepted: Orders ? ?

## 2022-02-20 NOTE — Progress Notes (Signed)
? ?Subjective:  ? ? Patient ID: Jill Spence, female    DOB: [birth year], 71 y.o.   MRN: [id number] ? ?HPI ?[name] is a 71 year old woman who presents to establish care for Guillaine Barre syndrome ? ?1) Guillain Barre Syndrome ?-she can't sleep at times because her pain is so severe at times ?-she only takes opioids when she cannot stand the pain ?-she was in the hospital for 4 years in [place] ?-she is proud of herself as she has learned to deal with herself ?-she has a strong will ? ?2) Dyspnea ?-uses oxygen ?-at times at home she can go without oxygen.  ? ?3) Chronic pain syndrome ?-she takes oxycodone three times per day as needed for severe pain.  ? ?Pain Inventory ?Average Pain 10 ?Pain Right Now 7 ?My pain is constant, sharp, dull, stabbing, tingling, aching, and throbbing, numbness ? ?In the last 24 hours, has pain interfered with the following? ?General activity 7 ?Relation with others 0 ?Enjoyment of life 6 ?What TIME of day is your pain at its worst? night ?Sleep (in general) Poor ? ?Pain is worse with: walking, bending, sitting, inactivity, standing, and some activites ?Pain improves with: rest, therapy/exercise, and medication ?Relief from Meds: 8 ? ?walk with assistance ?use a walker ?how many minutes can you walk? 3-4 ?ability to climb steps?  no ?do you drive?  no ? ?not employed: date last employed 2007 ?disabled: date disabled 2006-2007 ?I need assistance with the following:  dressing, bathing, toileting, meal prep, household duties, and shopping ? ?bladder control problems ?weakness ?numbness ?tremor ?tingling ?trouble walking ?spasms ?confusion ?anxiety ? ?New pt ? ?New pt ? ? ? ?Family History  ?Problem Relation Age of Onset  ? Cancer Father   ? ?Social History  ? ?Socioeconomic History  ? Marital status: Widowed  ?  Spouse name: Not on file  ? Number of children: Not on file  ? Years of education: Not on file  ? Highest education level: Not on file  ?Occupational History  ? Not on  file  ?Tobacco Use  ? Smoking status: Former  ?  Packs/day: 0.25  ?  Years: 6.00  ?  Pack years: 1.50  ?  Types: Cigarettes  ?  Quit date: 10/14/2007  ?  Years since quitting: 14.3  ? Smokeless tobacco: Never  ?Substance and Sexual Activity  ? Alcohol use: No  ? Drug use: No  ? Sexual activity: Not on file  ?Other Topics Concern  ? Not on file  ?Social History Narrative  ? Not on file  ? ?Social Determinants of Health  ? ?Financial Resource Strain: Not on file  ?Food Insecurity: Not on file  ?Transportation Needs: Not on file  ?Physical Activity: Not on file  ?Stress: Not on file  ?Social Connections: Not on file  ? ?Past Surgical History:  ?Procedure Laterality Date  ? BREAST SURGERY    ? COLONOSCOPY    ? EYE SURGERY Bilateral   ? cataract surgery with lens implants  ? MANDIBLE RECONSTRUCTION    ? MINOR REMOVAL OF MANDIBULAR HARDWARE Right 10/15/2015  ? Procedure: MINOR REMOVAL OF Right MANDIBULAR HARDWARE;  Surgeon: Diona Browner, DDS;  Location: [facility];  Service: Oral Surgery;  Laterality: Right;  ? TONSILLECTOMY    ? ?Past Medical History:  ?Diagnosis Date  ? Anemia   ? low iron  ? Anxiety   ? Arthritis   ? Bipolar disorder ([place])   ? COPD (chronic obstructive  pulmonary disease) ([place])   ? Family history of adverse reaction to anesthesia   ? mom "was put to sleep and she never woke up" -   ? GERD (gastroesophageal reflux disease)   ? Guillain Barr? syndrome (HCC)   ? numbness in toes, legs hurt  ? Headache   ? migraines as a teenager  ? IBS (irritable bowel syndrome)   ? Lump, breast   ? removed years ago per Pt.  ? Pneumonia   ? Restless leg syndrome   ? Seizures ([place])   ? only has one when she gets upset, has "quiet' seizures  ? ?Pulse 71   Ht '5\' 4"'$  (1.626 m)   Wt 168 lb (76.2 kg)   SpO2 97%   BMI 28.84 kg/m?  ? ?Opioid Risk Score:   ?Fall Risk Score:  `1 ? ?Depression screen PHQ 2/9 ? ?Depression screen [facility] 2/9 02/20/2022 05/24/2021  ?Decreased Interest 0 0  ?Down, Depressed, Hopeless 0 1  ?PHQ - 2 Score 0 1   ?Altered sleeping 1 -  ?Tired, decreased energy 0 -  ?Change in appetite 0 -  ?Feeling bad or failure about yourself  0 -  ?Trouble concentrating 1 -  ?Moving slowly or fidgety/restless 3 -  ?Suicidal thoughts 0 -  ?PHQ-9 Score 5 -  ?Difficult doing work/chores Not difficult at all -  ?  ?Review of Systems  ?Musculoskeletal:  Positive for back pain and neck pain.  ?     Lower leg pain  ?All other systems reviewed and are negative. ? ?   ?Objective:  ? Physical Exam ?Gen: no distress, normal appearing ?HEENT: oral mucosa pink and moist, NCAT ?Cardio: Reg rate ?Chest: normal effort, normal rate of breathing ?Abd: soft, non-distended ?Ext: no edema ?Psych: pleasant, normal affect ?Skin: intact ?Neuro: Alert and oriented x3 ?Musculoskeletal: Walking with RW.  ?   ?Assessment & Plan:  ?Jill Spence is a 71 year old woman who presents to establish care for severe neuropathic pain. ? ?1) Guillain Barre Syndrome ?-discussed her limitations in mobility due to breathing ? ?2) Chronic Pain Syndrome ?-discussed her limitations in mobility due to breathing status.  ?-discussed that she has been not recommended to be around many people due to her breathing status.  ?-UDS and pain contract signed today. If contains the expected metabolites, will prescribe oxycodone '5mg'$  up to three times per day as needed.  ?-continue '1200mg'$  gabapentin three times per day.  ?-Discussed following foods that may reduce pain: ?1) Ginger (especially studied for arthritis)- reduce leukotriene production to decrease inflammation ?2) Blueberries- high in phytonutrients that decrease inflammation ?3) Salmon- marine omega-3s reduce joint swelling and pain ?4) Pumpkin seeds- reduce inflammation ?5) dark chocolate- reduces inflammation ?6) turmeric- reduces inflammation ?7) tart cherries - reduce pain and stiffness ?8) extra virgin olive oil - its compound olecanthal helps to block prostaglandins  ?9) chili peppers- can be eaten or applied topically via  capsaicin ?10) mint- helpful for headache, muscle aches, joint pain, and itching ?11) garlic- reduces inflammation ? ?Link to further information on diet for chronic pain: [email or web address]  ?

## 2022-02-20 NOTE — Telephone Encounter (Signed)
Per Dr. Ranell Patrick:  ? ?Return for monthly with [name]; please check with Lattie Haw if it is ok for her to have telehealth appointments. She has a respiratory condition.   ? ? ?Patient has was advised at check out if she is receiving scripts from [facility], she may have to make some in office visits. ?

## 2022-02-21 ENCOUNTER — Telehealth: Payer: Self-pay

## 2022-02-21 NOTE — Telephone Encounter (Signed)
Patient called to ask if it was okay for her PCP to continue prescribing her Gabapentin. She understands that the Oxycodone can only be prescribed at out clinic ?

## 2022-02-24 LAB — TOXASSURE SELECT,+ANTIDEPR,UR

## 2022-03-01 ENCOUNTER — Telehealth: Payer: Self-pay | Admitting: *Deleted

## 2022-03-01 NOTE — Telephone Encounter (Signed)
Urine drug screen for this encounter is consistent for prescribed medication 

## 2022-03-03 ENCOUNTER — Other Ambulatory Visit: Payer: Self-pay

## 2022-03-03 NOTE — Telephone Encounter (Signed)
Sakoya Win knows Dr. Ranell Patrick is not in the office for the next 7 days.  ? ?Patient  called to have her Jill Spence 5-325 mg refill to be sent to the pharmacy. So it will be ready for pick up around Jill 15, 2023. ?. ?

## 2022-03-07 ENCOUNTER — Telehealth: Payer: Self-pay | Admitting: *Deleted

## 2022-03-07 ENCOUNTER — Encounter: Payer: Self-pay | Admitting: Pulmonary Disease

## 2022-03-07 ENCOUNTER — Ambulatory Visit (INDEPENDENT_AMBULATORY_CARE_PROVIDER_SITE_OTHER): Payer: 59 | Admitting: Pulmonary Disease

## 2022-03-07 VITALS — BP 120/70 | HR 77 | Temp 97.9°F | Ht 63.5 in | Wt 177.4 lb

## 2022-03-07 DIAGNOSIS — J449 Chronic obstructive pulmonary disease, unspecified: Secondary | ICD-10-CM

## 2022-03-07 MED ORDER — VITAMIN A 2250 MCG (7500 UT) PO CAPS: 1.0000 | ORAL_CAPSULE | Freq: Every day | ORAL | 5 refills | Status: DC

## 2022-03-07 MED ORDER — VITAMIN B COMPLEX PO TABS: 1.0000 | ORAL_TABLET | Freq: Every day | ORAL | 5 refills | Status: AC

## 2022-03-07 MED ORDER — BENZONATATE 100 MG PO CAPS: 100.0000 mg | ORAL_CAPSULE | Freq: Three times a day (TID) | ORAL | 1 refills | Status: DC | PRN

## 2022-03-07 NOTE — Patient Instructions (Signed)
We will follow-up on the CT scan ?Prescribed vitamin A and B ?Continue inhaler ?Follow-up in 6 months ?

## 2022-03-07 NOTE — Addendum Note (Signed)
Addended by: Elton Sin on: 03/07/2022 03:10 PM ? ? Modules accepted: Orders ? ?

## 2022-03-07 NOTE — Progress Notes (Signed)
? ?      ?Jill Spence    [id number]    1951/01/02 ? ?Primary Care Physician:Clark, Nettie Elm, MD ? ?Referring Physician: Shanon Ace, MD ?[address]  ?[address] ?[place],  [place] [address] ? ?Chief complaint: Follow-up for COPD ? ?HPI: ?71 year old with history of COPD on supplemental oxygen, lung nodules.  Previously followed at [facility]. ?Maintained on [place] and now Trelegy inhaler.  She has had recurrent exacerbations over the past year requiring prednisone, antibiotics. ?She is also being followed with serial CT scans for lung nodule ? ?States that she was hospitalized for COVID-19 at [facility] in early 2021 but I do not have any record of those. ?Diagnosed with multinodular goiter and hypothyroidism in 2022.  She is following with endocrinology and is on methimazole ? ?She had a CT chest and PET scan for evaluation of lung nodules ?PET scan showed hypermetabolic them in the right axilla.  She has been seen at dermatology and this lesion excised.  I have reviewed the previous notes from dermatology ?As per the patient and the final biopsy report from September 15 is benign keloid ? ?She also had a follow-up CT chest which showed improvement in lung nodule ? ?Pets: Has a dog ?Occupation: Retired Recruitment consultant ?Exposures: Reports mold in the previous apartment.  No current mold exposure.  No hot tub, Jacuzzi ?Smoking history: States that she quit smoking in 2008.  Cannot give me a clear idea of how much she smoked prior ?Travel history: No significant travel history ?Relevant family history: No significant family history of lung disease ? ?Interim history: ?Has chronic dyspnea on exertion with cough and mucus production. ? ?Outpatient Encounter Medications as of 03/07/2022  ?Medication Sig  ? albuterol (PROVENTIL HFA;VENTOLIN HFA) 108 (90 BASE) MCG/ACT inhaler Inhale 2 puffs into the lungs every 6 (six) hours as needed for wheezing.  ? albuterol (PROVENTIL) (2.5 MG/3ML) 0.083%  nebulizer solution Take 2.5 mg by nebulization every 6 (six) hours as needed for wheezing.  ? alendronate (FOSAMAX) 70 MG tablet Take 70 mg by mouth every Sunday. Take with a full glass of water on an empty stomach.  ? ARIPiprazole (ABILIFY) 15 MG tablet Take 15 mg by mouth daily.  ? benzonatate (TESSALON) 100 MG capsule Take 100 mg by mouth 3 (three) times daily as needed for cough.  ? benztropine (COGENTIN) 0.5 MG tablet Take 0.5 mg by mouth 2 (two) times daily.  ? cetirizine (ZYRTEC) 10 MG tablet Take 10 mg by mouth daily.  ? Cholecalciferol (VITAMIN D3) 125 MCG (5000 UT) CAPS Take 1 capsule (5,000 Units total) by mouth daily.  ? cromolyn (OPTICROM) 4 % ophthalmic solution Place 1 drop into both eyes 4 times daily.  ? famciclovir (FAMVIR) 250 MG tablet Take 250 mg by mouth 2 (two) times daily.  ? Fluticasone-Umeclidin-Vilant (TRELEGY ELLIPTA) 100-62.5-25 MCG/INH AEPB Inhale into the lungs.  ? hydrOXYzine (ATARAX/VISTARIL) 25 MG tablet Take 25 mg by mouth 2 (two) times daily.  ? ipratropium-albuterol (DUONEB) 0.5-2.5 (3) MG/3ML SOLN Take 3 mLs by nebulization every 4 (four) hours as needed.  ? magnesium oxide (MAG-OX) 400 MG tablet Take 250 mg by mouth daily.  ? meloxicam (MOBIC) 7.5 MG tablet Take 7.5 mg by mouth daily.  ? methimazole (TAPAZOLE) 5 MG tablet Take 1 tablet (5 mg total) by mouth daily.  ? montelukast (SINGULAIR) 10 MG tablet Take 10 mg by mouth at bedtime.  ? oxcarbazepine (TRILEPTAL) 600 MG tablet Take 600  mg by mouth 2 (two) times daily.  ? oxyCODONE-acetaminophen (PERCOCET) 5-325 MG tablet Take 1-2 tablets by mouth every 4 (four) hours as needed.  ? pantoprazole (PROTONIX) 40 MG tablet Take 40 mg by mouth daily.  ? predniSONE (DELTASONE) 10 MG tablet Take 10 mg by mouth daily with breakfast.  ? predniSONE (DELTASONE) 20 MG tablet Take '40mg'$  for 5 days  ? ranitidine (ZANTAC) 300 MG capsule Take 300 mg by mouth every evening.  ? traZODone (DESYREL) 50 MG tablet Take 50 mg by mouth at bedtime.  ?  potassium chloride SA (KLOR-CON) 20 MEQ tablet Take 1 tablet (20 mEq total) by mouth 2 (two) times daily for 7 days.  ? [DISCONTINUED] gabapentin (NEURONTIN) 300 MG capsule Take 600 mg by mouth 2 (two) times daily.  ? ?No facility-administered encounter medications on file as of 03/07/2022.  ? ?Physical Exam: ?Blood pressure 120/70, pulse 77, temperature 97.9 ?F (36.6 ?C), temperature source Oral, height 5' 3.5" (1.613 m), weight 177 lb 6.4 oz (80.5 kg), SpO2 97 %. ?Gen:      No acute distress ?HEENT:  EOMI, sclera anicteric ?Neck:     No masses; no thyromegaly ?Lungs:    Clear to auscultation bilaterally; normal respiratory effort ?CV:         Regular rate and rhythm; no murmurs ?Abd:      + bowel sounds; soft, non-tender; no palpable masses, no distension ?Ext:    No edema; adequate peripheral perfusion ?Skin:      Warm and dry; no rash ?Neuro: alert and oriented x 3 ?Psych: normal mood and affect  ? ?Data Reviewed: ?Imaging: ?CT chest 03/29/2020-cirrhosis, thyroid nodule, severe emphysema ? ?A new 4 mm nodule is seen along the minor fissure (3/61). 6 mm nodule in the lateral  ?segment right middle lobe (3/69) has enlarged from 3 mm on  ?01/01/2019. 2 mm anterior right lower lobe nodule (3/76) is new.  ?Scarring in the apex of the left upper lobe with a new nodular  ?component measuring 6 mm (3/12). 3 mm lingular nodule (3/63) is new.  ?Additional tiny pulmonary nodules in the left lung are stable. No  ?pleural fluid. Airway is unremarkable.  ? ?CT chest 07/14/2020-new 5 mm nodule in the right middle lobe.  Remainder of the lung nodules are stable.  Enlarged pulmonary trunk ? ?CT chest 10/20/2020-stable right middle lobe nodule, thyroid nodule, aortic atherosclerosis, emphysema ? ?CT chest 04/14/2021-interval development of pulmonary nodules ? ?PET scan 04/30/2019 2-12 mm lesion along the left anterior shoulder/axilla.  Multiple bilateral nodules up to 10 mm. ? ?Super D CT 08/02/2021-recently noted pulm nodules are  stable to decreased in size. ?I have reviewed the images personally. ? ?Labs: ?CBC 07/27/2020-WBC 7.3, eos 1%, absolute eosinophil count 73 ?IgE 07/27/2020-5 ? ?Alpha-1 antitrypsin 08/28/2028-170, PIMM ? ?Assessment:  ?Severe COPD with exacerbations ?Currently on Trelegy inhaler. ?Duo nebs, nebulizer ? ?Mucinex, flutter valve for mucociliary clearance ?She is continuing with physical therapy.  Declined pulmonary rehab. ? ?She is trying to maintain a healthy lifestyle and is requesting prescription for vitamin a, b and air purifier which she gets through her home health company. ? ?Pulmonary nodules ?We are following this closely. CT from 08/02/2021 does show some improvement in size which is reassuring. ?Follow-up CT is pending for next month ? ?Abnormal PET scan ?PET scan showed hypermetabolic lesion along the left shoulder/axilla.  She had a skin lesion which was excised by dermatology and is reportedly a benign keloid.  Awaiting records from dermatology ? ? ?  Plan/Recommendations: ?Continue Trelegy ?Follow-up CT in 6 months ? ?Marshell Garfinkel MD ?[facility] ?03/07/2022, 2:43 PM ? ?CC: Shanon Ace,* ? ? ?

## 2022-03-07 NOTE — Telephone Encounter (Signed)
Jill Spence came by the office today. She wanted to make sure you send  in her refill of Oxycodone to the pharmacy. Will you please send it and put  a hold on it. Jill Spence is afraid she may forget to call in time for the refills.  ?

## 2022-03-07 NOTE — Telephone Encounter (Signed)
Jill Spence is calling for refill for her pain medication,  It looks like Dr Ranell Patrick has not prescribed but her UDS was appropriate and her note says what her plan for medication is.  [name] can you fill this? ?

## 2022-03-08 MED ORDER — OXYCODONE HCL 5 MG PO TABS: 5.0000 mg | ORAL_TABLET | Freq: Three times a day (TID) | ORAL | 0 refills | Status: DC | PRN

## 2022-03-08 NOTE — Telephone Encounter (Signed)
Dr Ranell Patrick Note was Reviewed.  ?PMP was Reviewed. Last Oxycodone was filled on 02/15/2022 ?UDS was Reviewed.  ?Oxycodone e-scribed today.  ?Placed a call to Ms. Jurczyk, no answer. Left message to return the call.  ?

## 2022-03-14 ENCOUNTER — Telehealth: Payer: Self-pay | Admitting: *Deleted

## 2022-03-14 NOTE — Telephone Encounter (Signed)
Jill Spence called to request a refill on her oxycodone. Her last fill date per PMP was 02/15/22 and her UDS was consistent with prescribed meds. Her next appt is a telephone visit  with [name] 04/04/22.  ?

## 2022-03-14 NOTE — Telephone Encounter (Signed)
Oxycodone was already ordered.  ?

## 2022-03-20 ENCOUNTER — Ambulatory Visit: Payer: 59 | Admitting: Registered Nurse

## 2022-03-23 ENCOUNTER — Ambulatory Visit (INDEPENDENT_AMBULATORY_CARE_PROVIDER_SITE_OTHER): Payer: 59 | Admitting: Endocrinology

## 2022-03-23 VITALS — BP 110/76 | HR 76 | Ht 63.5 in | Wt 277.0 lb

## 2022-03-23 DIAGNOSIS — E559 Vitamin D deficiency, unspecified: Secondary | ICD-10-CM | POA: Diagnosis not present

## 2022-03-23 DIAGNOSIS — E0590 Thyrotoxicosis, unspecified without thyrotoxic crisis or storm: Secondary | ICD-10-CM | POA: Diagnosis not present

## 2022-03-23 LAB — VITAMIN D 25 HYDROXY (VIT D DEFICIENCY, FRACTURES): VITD: 55.55 ng/mL (ref 30.00–100.00)

## 2022-03-23 LAB — TSH: TSH: 0.59 u[IU]/mL (ref 0.35–5.50)

## 2022-03-23 LAB — T4, FREE: Free T4: 0.65 ng/dL (ref 0.60–1.60)

## 2022-03-23 NOTE — Patient Instructions (Addendum)
Blood tests are requested for you today.  We'll let you know about the results.    ?If ever you have fever while taking methimazole, stop it and call us, even if the reason is obvious, because of the risk of a rare side-effect.   ?It is best to never miss the medication.  However, if you do miss it, next best is to double up the next time.   ?You should plan to recheck the ultrasound later this year.   ?You should have an endocrinology follow-up appointment in 4 months.   ?

## 2022-03-23 NOTE — Progress Notes (Signed)
? ?Subjective:  ? ? Patient ID: Jill Spence, female    DOB: [birth year], 71 y.o.   MRN: [id number] ? 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  She takes Vit-D 50,000 units QOW.  Plan to to also check FSH/LH were menopausal.  No change in chronic doe.    ?Past Medical History:  ?Diagnosis Date  ? Anemia   ? low iron  ? Anxiety   ? Arthritis   ? Bipolar disorder ([place])   ? COPD (chronic obstructive pulmonary disease) ([place])   ? Family history of adverse reaction to anesthesia   ? mom "was put to sleep and she never woke up" -   ? GERD (gastroesophageal reflux disease)   ? Guillain Barr? syndrome (HCC)   ? numbness in toes, legs hurt  ? Headache   ? migraines as a teenager  ? IBS (irritable bowel syndrome)   ? Lump, breast   ? removed years ago per Pt.  ? Pneumonia   ? Restless leg syndrome   ? Seizures ([place])   ? only has one when she gets upset, has "quiet' seizures  ? ? ?Past Surgical History:  ?Procedure Laterality Date  ? BREAST SURGERY    ? COLONOSCOPY    ? EYE SURGERY Bilateral   ? cataract surgery with lens implants  ? MANDIBLE RECONSTRUCTION    ? MINOR REMOVAL OF MANDIBULAR HARDWARE Right 10/15/2015  ? Procedure: MINOR REMOVAL OF Right MANDIBULAR HARDWARE;  Surgeon: Diona Browner, DDS;  Location: [facility];  Service: Oral Surgery;  Laterality: Right;  ? TONSILLECTOMY    ? ? ?Social History  ? ?Socioeconomic History  ? Marital status: Widowed  ?  Spouse name: Not on file  ? Number of children: Not on file  ? Years of education: Not on file  ? Highest education level: Not on file  ?Occupational History  ? Not on file  ?Tobacco Use  ? Smoking  status: Former  ?  Packs/day: 0.25  ?  Years: 6.00  ?  Pack years: 1.50  ?  Types: Cigarettes  ?  Quit date: 10/14/2007  ?  Years since quitting: 14.4  ? Smokeless tobacco: Never  ?Substance and Sexual Activity  ? Alcohol use: No  ? Drug use: No  ? Sexual activity: Not on file  ?Other Topics Concern  ? Not on file  ?Social History Narrative  ? Not on file  ? ?Social Determinants of Health  ? ?Financial Resource Strain: Not on file  ?Food Insecurity: Not on file  ?Transportation Needs: Not on file  ?Physical Activity: Not on file  ?Stress: Not on file  ?Social Connections: Not on file  ?Intimate Partner Violence: Not on file  ? ? ?Current Outpatient Medications on File Prior to Visit  ?Medication Sig Dispense Refill  ? albuterol (PROVENTIL HFA;VENTOLIN HFA) 108 (90 BASE) MCG/ACT inhaler Inhale 2 puffs into the lungs every 6 (six) hours as needed for wheezing.    ? albuterol (PROVENTIL) (2.5 MG/3ML) 0.083% nebulizer solution Take 2.5 mg by nebulization every 6 (six) hours as needed for wheezing.    ? alendronate (FOSAMAX) 70 MG  tablet Take 70 mg by mouth every Sunday. Take with a full glass of water on an empty stomach.    ? ARIPiprazole (ABILIFY) 15 MG tablet Take 15 mg by mouth daily.    ? B Complex Vitamins (VITAMIN B COMPLEX) TABS Take 1 tablet by mouth daily. 30 tablet 5  ? benzonatate (TESSALON) 100 MG capsule Take 1 capsule (100 mg total) by mouth 3 (three) times daily as needed for cough. 90 capsule 1  ? benztropine (COGENTIN) 0.5 MG tablet Take 0.5 mg by mouth 2 (two) times daily.    ? cetirizine (ZYRTEC) 10 MG tablet Take 10 mg by mouth daily.    ? Cholecalciferol (VITAMIN D3) 125 MCG (5000 UT) CAPS Take 1 capsule (5,000 Units total) by mouth daily. 30 capsule 11  ? cromolyn (OPTICROM) 4 % ophthalmic solution Place 1 drop into both eyes 4 times daily.    ? famciclovir (FAMVIR) 250 MG tablet Take 250 mg by mouth 2 (two) times daily.    ? Fluticasone-Umeclidin-Vilant (TRELEGY ELLIPTA) 100-62.5-25 MCG/INH AEPB  Inhale into the lungs.    ? hydrOXYzine (ATARAX/VISTARIL) 25 MG tablet Take 25 mg by mouth 2 (two) times daily.    ? ipratropium-albuterol (DUONEB) 0.5-2.5 (3) MG/3ML SOLN Take 3 mLs by nebulization every 4 (four) hours as needed. 360 mL 5  ? magnesium oxide (MAG-OX) 400 MG tablet Take 250 mg by mouth daily.    ? meloxicam (MOBIC) 7.5 MG tablet Take 7.5 mg by mouth daily.    ? montelukast (SINGULAIR) 10 MG tablet Take 10 mg by mouth at bedtime.    ? oxcarbazepine (TRILEPTAL) 600 MG tablet Take 600 mg by mouth 2 (two) times daily.    ? oxyCODONE (OXY IR/ROXICODONE) 5 MG immediate release tablet Take 1 tablet (5 mg total) by mouth 3 (three) times daily as needed for moderate pain. Do Not Fill Before 03/14/2022 90 tablet 0  ? pantoprazole (PROTONIX) 40 MG tablet Take 40 mg by mouth daily.    ? predniSONE (DELTASONE) 10 MG tablet Take 10 mg by mouth daily with breakfast.    ? predniSONE (DELTASONE) 20 MG tablet Take '40mg'$  for 5 days 10 tablet 0  ? ranitidine (ZANTAC) 300 MG capsule Take 300 mg by mouth every evening.    ? traZODone (DESYREL) 50 MG tablet Take 50 mg by mouth at bedtime.    ? Vitamin A 2250 MCG (7500 UT) CAPS Take 1 tablet by mouth daily. 30 capsule 5  ? potassium chloride SA (KLOR-CON) 20 MEQ tablet Take 1 tablet (20 mEq total) by mouth 2 (two) times daily for 7 days. 14 tablet 0  ? [DISCONTINUED] gabapentin (NEURONTIN) 300 MG capsule Take 600 mg by mouth 2 (two) times daily.    ? ?No current facility-administered medications on file prior to visit.  ? ? ?Allergies  ?Allergen Reactions  ? Aspirin   ? Influenza Vaccines   ? ? ?Family History  ?Problem Relation Age of Onset  ? Cancer Father   ? ? ?BP 110/76 (BP Location: Right Arm, Patient Position: Sitting, Cuff Size: Normal)   Pulse 76   Ht 5' 3.5" (1.613 m)   Wt 277 lb (125.6 kg)   SpO2 98%   BMI 48.30 kg/m?  ? ? ?Review of Systems ?Denies fever ?   ?Objective:  ? Physical Exam ?VITAL SIGNS:  See vs page.   ?GENERAL: no distress.  Has 02 on. ?NECK:  2 cm left sided thyroid nodule is easily palpable.   ? ? ?  Lab Results  ?Component Value Date  ? TSH 0.59 03/23/2022  ? ?   ?Assessment & Plan:  ?Hyperthyroidism: well-controlled.  Please continue the same methimazole.   ? ?

## 2022-03-24 LAB — PTH, INTACT AND CALCIUM
Calcium: 8.9 mg/dL (ref 8.6–10.4)
PTH: 68 pg/mL (ref 16–77)

## 2022-03-25 MED ORDER — METHIMAZOLE 5 MG PO TABS: 5.0000 mg | ORAL_TABLET | Freq: Every day | ORAL | 3 refills | Status: DC

## 2022-04-04 ENCOUNTER — Encounter: Payer: Self-pay | Admitting: Registered Nurse

## 2022-04-04 ENCOUNTER — Encounter: Payer: 59 | Attending: Physical Medicine and Rehabilitation | Admitting: Registered Nurse

## 2022-04-04 VITALS — BP 147/94 | HR 68 | Ht 63.5 in | Wt 177.0 lb

## 2022-04-04 DIAGNOSIS — M25562 Pain in left knee: Secondary | ICD-10-CM | POA: Diagnosis present

## 2022-04-04 DIAGNOSIS — Z5181 Encounter for therapeutic drug level monitoring: Secondary | ICD-10-CM | POA: Diagnosis present

## 2022-04-04 DIAGNOSIS — M2550 Pain in unspecified joint: Secondary | ICD-10-CM | POA: Diagnosis present

## 2022-04-04 DIAGNOSIS — Z79891 Long term (current) use of opiate analgesic: Secondary | ICD-10-CM

## 2022-04-04 DIAGNOSIS — M25561 Pain in right knee: Secondary | ICD-10-CM | POA: Diagnosis present

## 2022-04-04 DIAGNOSIS — G8929 Other chronic pain: Secondary | ICD-10-CM

## 2022-04-04 DIAGNOSIS — G610 Guillain-Barre syndrome: Secondary | ICD-10-CM

## 2022-04-04 DIAGNOSIS — G894 Chronic pain syndrome: Secondary | ICD-10-CM

## 2022-04-04 MED ORDER — OXYCODONE HCL 5 MG PO TABS: 5.0000 mg | ORAL_TABLET | Freq: Three times a day (TID) | ORAL | 0 refills | Status: DC | PRN

## 2022-04-04 NOTE — Progress Notes (Deleted)
? ?Subjective:  ? ? Patient ID: Jill Spence, female    DOB: [birth year], 71 y.o.   MRN: [id number] ? ?HPI ? ?Pain Inventory ?Average Pain {NUMBERS; 0-10:5044} ?Pain Right Now {NUMBERS; 0-10:5044} ?My pain is {PAIN DESCRIPTION:21022940} ? ?In the last 24 hours, has pain interfered with the following? ?General activity {NUMBERS; 0-10:5044} ?Relation with others {NUMBERS; 0-10:5044} ?Enjoyment of life {NUMBERS; 0-10:5044} ?What TIME of day is your pain at its worst? {time of day:24191} ?Sleep (in general) {BHH GOOD/FAIR/POOR:22877} ? ?Pain is worse with: {ACTIVITIES:21022942} ?Pain improves with: {PAIN IMPROVES IWLN:98921194} ?Relief from Meds: {NUMBERS; 0-10:5044} ? ?Family History  ?Problem Relation Age of Onset  ? Cancer Father   ? ?Social History  ? ?Socioeconomic History  ? Marital status: Widowed  ?  Spouse name: Not on file  ? Number of children: Not on file  ? Years of education: Not on file  ? Highest education level: Not on file  ?Occupational History  ? Not on file  ?Tobacco Use  ? Smoking status: Former  ?  Packs/day: 0.25  ?  Years: 6.00  ?  Pack years: 1.50  ?  Types: Cigarettes  ?  Quit date: 10/14/2007  ?  Years since quitting: 14.4  ? Smokeless tobacco: Never  ?Substance and Sexual Activity  ? Alcohol use: No  ? Drug use: No  ? Sexual activity: Not on file  ?Other Topics Concern  ? Not on file  ?Social History Narrative  ? Not on file  ? ?Social Determinants of Health  ? ?Financial Resource Strain: Not on file  ?Food Insecurity: Not on file  ?Transportation Needs: Not on file  ?Physical Activity: Not on file  ?Stress: Not on file  ?Social Connections: Not on file  ? ?Past Surgical History:  ?Procedure Laterality Date  ? BREAST SURGERY    ? COLONOSCOPY    ? EYE SURGERY Bilateral   ? cataract surgery with lens implants  ? MANDIBLE RECONSTRUCTION    ? MINOR REMOVAL OF MANDIBULAR HARDWARE Right 10/15/2015  ? Procedure: MINOR REMOVAL OF Right MANDIBULAR HARDWARE;  Surgeon: Diona Browner, DDS;  Location: [facility];  Service: Oral Surgery;  Laterality: Right;  ? TONSILLECTOMY    ? ?Past Surgical History:  ?Procedure Laterality Date  ? BREAST SURGERY    ? COLONOSCOPY    ? EYE SURGERY Bilateral   ? cataract surgery with lens implants  ? MANDIBLE RECONSTRUCTION    ? MINOR REMOVAL OF MANDIBULAR HARDWARE Right 10/15/2015  ? Procedure: MINOR REMOVAL OF Right MANDIBULAR HARDWARE;  Surgeon: Diona Browner, DDS;  Location: [facility];  Service: Oral Surgery;  Laterality: Right;  ? TONSILLECTOMY    ? ?Past Medical History:  ?Diagnosis Date  ? Anemia   ? low iron  ? Anxiety   ? Arthritis   ? Bipolar disorder ([employer])   ? COPD (chronic obstructive pulmonary disease) ([place])   ? Family history of adverse reaction to anesthesia   ? mom "was put to sleep and she never woke up" -   ? GERD (gastroesophageal reflux disease)   ? Guillain Barr? syndrome (HCC)   ? numbness in toes, legs hurt  ? Headache   ? migraines as a teenager  ? IBS (irritable bowel syndrome)   ? Lump, breast   ? removed years ago per Pt.  ? Pneumonia   ? Restless leg syndrome   ? Seizures ([place])   ? only has one when she gets upset, has "quiet' seizures  ? ?There were  no vitals taken for this visit. ? ?Opioid Risk Score:   ?Fall Risk Score:  `1 ? ?Depression screen PHQ 2/9 ? ? ?  02/20/2022  ? 12:58 PM 05/24/2021  ?  2:10 PM  ?Depression screen PHQ 2/9  ?Decreased Interest 0 0  ?Down, Depressed, Hopeless 0 1  ?PHQ - 2 Score 0 1  ?Altered sleeping 1   ?Tired, decreased energy 0   ?Change in appetite 0   ?Feeling bad or failure about yourself  0   ?Trouble concentrating 1   ?Moving slowly or fidgety/restless 3   ?Suicidal thoughts 0   ?PHQ-9 Score 5   ?Difficult doing work/chores Not difficult at all   ?  ? ?Review of Systems ? ?   ?Objective:  ? Physical Exam ? ? ? ? ?   ?Assessment & Plan:  ? ? ?

## 2022-04-04 NOTE — Progress Notes (Signed)
? ?Subjective:  ? ? Patient ID: Jill Spence, female    DOB: [birth year], 71 y.o.   MRN: [id number] ? ?HPI: Jill Spence is a 71 y.o. female who returns for follow up appointment for chronic pain and medication refill. She states her pain is located in her bilateralknees R>L and generalized joint pain. She  rates her pain 7. Her current exercise regime is attending physical therapy two days a week and walking short distances with walker.  ? ?Ms. Polimeni Morphine equivalent is 22.50 MME.   Last UDS was Performed on 02/20/2022, it was consistent.  ? ?[employer] Careers information officer in room.  ?  ? ?Pain Inventory ?Average Pain 9 ?Pain Right Now 7 ?My pain is intermittent and stabbing ? ?In the last 24 hours, has pain interfered with the following? ?General activity 7 ?Relation with others 1 ?Enjoyment of life 1 ?What TIME of day is your pain at its worst? night ?Sleep (in general)  varies has restless leg ? ?Pain is worse with: walking, standing, some activites, and damp air ?Pain improves with: rest, therapy/exercise, and medication ?Relief from Meds: 4 ? ?Family History  ?Problem Relation Age of Onset  ? Cancer Father   ? ?Social History  ? ?Socioeconomic History  ? Marital status: Widowed  ?  Spouse name: Not on file  ? Number of children: Not on file  ? Years of education: Not on file  ? Highest education level: Not on file  ?Occupational History  ? Not on file  ?Tobacco Use  ? Smoking status: Former  ?  Packs/day: 0.25  ?  Years: 6.00  ?  Pack years: 1.50  ?  Types: Cigarettes  ?  Quit date: 10/14/2007  ?  Years since quitting: 14.4  ? Smokeless tobacco: Never  ?Substance and Sexual Activity  ? Alcohol use: No  ? Drug use: No  ? Sexual activity: Not on file  ?Other Topics Concern  ? Not on file  ?Social History Narrative  ? Not on file  ? ?Social Determinants of Health  ? ?Financial Resource Strain: Not on file  ?Food Insecurity: Not on file  ?Transportation Needs: Not on file  ?Physical Activity: Not on file   ?Stress: Not on file  ?Social Connections: Not on file  ? ?Past Surgical History:  ?Procedure Laterality Date  ? BREAST SURGERY    ? COLONOSCOPY    ? EYE SURGERY Bilateral   ? cataract surgery with lens implants  ? MANDIBLE RECONSTRUCTION    ? MINOR REMOVAL OF MANDIBULAR HARDWARE Right 10/15/2015  ? Procedure: MINOR REMOVAL OF Right MANDIBULAR HARDWARE;  Surgeon: Diona Browner, DDS;  Location: [facility];  Service: Oral Surgery;  Laterality: Right;  ? TONSILLECTOMY    ? ?Past Surgical History:  ?Procedure Laterality Date  ? BREAST SURGERY    ? COLONOSCOPY    ? EYE SURGERY Bilateral   ? cataract surgery with lens implants  ? MANDIBLE RECONSTRUCTION    ? MINOR REMOVAL OF MANDIBULAR HARDWARE Right 10/15/2015  ? Procedure: MINOR REMOVAL OF Right MANDIBULAR HARDWARE;  Surgeon: Diona Browner, DDS;  Location: [facility];  Service: Oral Surgery;  Laterality: Right;  ? TONSILLECTOMY    ? ?Past Medical History:  ?Diagnosis Date  ? Anemia   ? low iron  ? Anxiety   ? Arthritis   ? Bipolar disorder ([place])   ? COPD (chronic obstructive pulmonary disease) ([place])   ? Family history of adverse reaction to anesthesia   ? mom "  was put to sleep and she never woke up" -   ? GERD (gastroesophageal reflux disease)   ? Guillain Barr? syndrome (HCC)   ? numbness in toes, legs hurt  ? Headache   ? migraines as a teenager  ? IBS (irritable bowel syndrome)   ? Lump, breast   ? removed years ago per Pt.  ? Pneumonia   ? Restless leg syndrome   ? Seizures ([place])   ? only has one when she gets upset, has "quiet' seizures  ? ?BP (!) 147/94   Pulse 68   Ht 5' 3.5" (1.613 m)   Wt 177 lb (80.3 kg)   SpO2 95% Comment: 3 L [place]  BMI 30.86 kg/m?  ? ?Opioid Risk Score:   ?Fall Risk Score:  `1 ? ?Depression screen PHQ 2/9 ? ? ?  04/04/2022  ?  1:21 PM 02/20/2022  ? 12:58 PM 05/24/2021  ?  2:10 PM  ?Depression screen PHQ 2/9  ?Decreased Interest 0 0 0  ?Down, Depressed, Hopeless 0 0 1  ?PHQ - 2 Score 0 0 1  ?Altered sleeping  1   ?Tired, decreased energy  0   ?Change in  appetite  0   ?Feeling bad or failure about yourself   0   ?Trouble concentrating  1   ?Moving slowly or fidgety/restless  3   ?Suicidal thoughts  0   ?PHQ-9 Score  5   ?Difficult doing work/chores  Not difficult at all   ?  ? ?Review of Systems  ?Constitutional: Negative.   ?HENT: Negative.    ?Eyes: Negative.   ?Respiratory: Negative.    ?Cardiovascular: Negative.   ?Gastrointestinal: Negative.   ?Endocrine: Negative.   ?Genitourinary: Negative.   ?Musculoskeletal:  Positive for arthralgias and myalgias.  ?Skin: Negative.   ?Allergic/Immunologic: Negative.   ?Neurological:  Positive for weakness.  ?Hematological: Negative.   ?Psychiatric/Behavioral: Negative.    ?All other systems reviewed and are negative. ? ?   ?Objective:  ? Physical Exam ?Vitals and nursing note reviewed.  ?Constitutional:   ?   Appearance: Normal appearance.  ?Cardiovascular:  ?   Rate and Rhythm: Normal rate and regular rhythm.  ?   Pulses: Normal pulses.  ?   Heart sounds: Normal heart sounds.  ?Pulmonary:  ?   Effort: Pulmonary effort is normal.  ?   Breath sounds: Normal breath sounds.  ?   Comments: Continuous Oxygen @ 3 Liters nasal Cannula ? ?Musculoskeletal:  ?   Cervical back: Normal range of motion and neck supple.  ?   Comments: Normal Muscle Bulk and Muscle Testing Reveals: ?Upper Extremities: Full ROM and Muscle Strength 5/5 ?Bilateral AC Joint Tenderness ? Lower Extremities: Right: Decreased ROM and Muscle Strength 5/5  ?Right Lower Extremity Flexion Produces Pain into her Patella ?Left Lower Extremity: Full ROM and Muscle Strength 5/5 ?Arising from Table Slowly, using walker for support ?Antalgic Gait  ?   ?Skin: ?   General: Skin is warm and dry.  ?Neurological:  ?   Mental Status: She is alert and oriented to person, place, and time.  ?Psychiatric:     ?   Mood and Affect: Mood normal.     ?   Behavior: Behavior normal.  ? ? ? ? ?   ?Assessment & Plan:  ?Guillain Barre Syndrome: Continue current medication regimen. Continue  Outpatient Therapy. Continue current medication regimen. Continue to Monitor.  ?Polyarthralgia: Continue current medication regimen . Continue to Monitor.  ?Chronic Pain of Both  Knees: Continue Outpatient Therapy. Continue current medication regimen. Continue to Monitor.  ?Chronic Pain Syndrome: Refilled Oxycodone 5 mg three times a day as needed for pain. #90. We will continue the opioid monitoring program, this consists of regular clinic visits, examinations, urine drug screen, pill counts as well as use of [place] Controlled Substance Reporting system. A 12 month History has been reviewed on the [place] Controlled Substance Reporting System on 04/04/2022\ ?F/U in 1 month  ? ? ? ? ? ? ?

## 2022-04-04 NOTE — Patient Instructions (Signed)
My- Chart:   [phone]  

## 2022-04-10 ENCOUNTER — Other Ambulatory Visit (HOSPITAL_BASED_OUTPATIENT_CLINIC_OR_DEPARTMENT_OTHER): Payer: 59

## 2022-04-10 ENCOUNTER — Ambulatory Visit (HOSPITAL_BASED_OUTPATIENT_CLINIC_OR_DEPARTMENT_OTHER)
Admission: RE | Admit: 2022-04-10 | Discharge: 2022-04-10 | Disposition: A | Discharge status: Home / Self Care | Payer: 59 | Source: Ambulatory Visit | Attending: Pulmonary Disease | Admitting: Pulmonary Disease

## 2022-04-10 DIAGNOSIS — R9389 Abnormal findings on diagnostic imaging of other specified body structures: Secondary | ICD-10-CM | POA: Insufficient documentation

## 2022-04-10 DIAGNOSIS — J449 Chronic obstructive pulmonary disease, unspecified: Secondary | ICD-10-CM | POA: Diagnosis present

## 2022-04-10 DIAGNOSIS — R911 Solitary pulmonary nodule: Secondary | ICD-10-CM | POA: Insufficient documentation

## 2022-05-09 ENCOUNTER — Encounter: Payer: Self-pay | Admitting: Registered Nurse

## 2022-05-09 ENCOUNTER — Encounter: Payer: 59 | Attending: Physical Medicine and Rehabilitation | Admitting: Registered Nurse

## 2022-05-09 VITALS — Ht 63.5 in | Wt 168.0 lb

## 2022-05-09 DIAGNOSIS — G894 Chronic pain syndrome: Secondary | ICD-10-CM | POA: Diagnosis not present

## 2022-05-09 DIAGNOSIS — Z79891 Long term (current) use of opiate analgesic: Secondary | ICD-10-CM | POA: Insufficient documentation

## 2022-05-09 DIAGNOSIS — M2550 Pain in unspecified joint: Secondary | ICD-10-CM | POA: Diagnosis not present

## 2022-05-09 DIAGNOSIS — G610 Guillain-Barre syndrome: Secondary | ICD-10-CM | POA: Diagnosis not present

## 2022-05-09 DIAGNOSIS — G8929 Other chronic pain: Secondary | ICD-10-CM | POA: Insufficient documentation

## 2022-05-09 DIAGNOSIS — Z5181 Encounter for therapeutic drug level monitoring: Secondary | ICD-10-CM | POA: Insufficient documentation

## 2022-05-09 DIAGNOSIS — M25561 Pain in right knee: Secondary | ICD-10-CM | POA: Insufficient documentation

## 2022-05-09 DIAGNOSIS — M25562 Pain in left knee: Secondary | ICD-10-CM | POA: Insufficient documentation

## 2022-05-09 NOTE — Progress Notes (Signed)
Subjective:    Patient ID: Jill Spence, female    DOB: 17-Dec-1950, 71 y.o.   MRN: [id number]  HPI: Jill Spence is a 71 y.o. female who is scheduled for My-Chart visit, she was unable to access My-Chart. Her visit was changed to a telephone visit, we have  discussed the limitations of evaluation and management by telemedicine and the availability of in person appointments. The patient expressed understanding and agreed to proceed. She states her  pain is located in her lower back, right knee and generalized pain.She states she is only receiving 40- 6 hours of pain relief with her medication regimen. She currently has 42 tablets of her oxycodone. She hasn't taken her medication as prescribed, since she had family visiting and she didn't want to be drowsy. She currently has 14 days of medication, she was instructed to take medication as prescribed, she verbalizes understanding. she will keep a pain log, and send a My-Chart message with update. She  rates her  pain 8. Her current exercise regime is walking with her walker.   Pain Inventory Average Pain 9 Pain Right Now 8 My pain is aching and throbbing  In the last 24 hours, has pain interfered with the following? General activity 10 Relation with others 0 Enjoyment of life 0 What TIME of day is your pain at its worst? night and varies Sleep (in general) Poor  Pain is worse with: standing and some activites Pain improves with: medication Relief from Meds: 1  Family History  Problem Relation Age of Onset   Cancer Father    Social History   Socioeconomic History   Marital status: Widowed    Spouse name: Not on file   Number of children: Not on file   Years of education: Not on file   Highest education level: Not on file  Occupational History   Not on file  Tobacco Use   Smoking status: Former    Packs/day: 0.25    Years: 6.00    Pack years: 1.50    Types: Cigarettes    Quit date: 10/14/2007    Years since quitting: 14.5    Smokeless tobacco: Never  Vaping Use   Vaping Use: Never used  Substance and Sexual Activity   Alcohol use: No   Drug use: No   Sexual activity: Not on file  Other Topics Concern   Not on file  Social History Narrative   Not on file   Social Determinants of Health   Financial Resource Strain: Not on file  Food Insecurity: Not on file  Transportation Needs: Not on file  Physical Activity: Not on file  Stress: Not on file  Social Connections: Not on file   Past Surgical History:  Procedure Laterality Date   BREAST SURGERY     COLONOSCOPY     EYE SURGERY Bilateral    cataract surgery with lens implants   MANDIBLE RECONSTRUCTION     MINOR REMOVAL OF MANDIBULAR HARDWARE Right 10/15/2015   Procedure: MINOR REMOVAL OF Right MANDIBULAR HARDWARE;  Surgeon: Diona Browner, DDS;  Location: [facility];  Service: Oral Surgery;  Laterality: Right;   TONSILLECTOMY     Past Surgical History:  Procedure Laterality Date   BREAST SURGERY     COLONOSCOPY     EYE SURGERY Bilateral    cataract surgery with lens implants   MANDIBLE RECONSTRUCTION     MINOR REMOVAL OF MANDIBULAR HARDWARE Right 10/15/2015   Procedure: MINOR REMOVAL OF Right MANDIBULAR HARDWARE;  Surgeon: Diona Browner, DDS;  Location: [facility];  Service: Oral Surgery;  Laterality: Right;   TONSILLECTOMY     Past Medical History:  Diagnosis Date   Anemia    low iron   Anxiety    Arthritis    Bipolar disorder (HCC)    COPD (chronic obstructive pulmonary disease) (HCC)    Family history of adverse reaction to anesthesia    mom "was put to sleep and she never woke up" -    GERD (gastroesophageal reflux disease)    Guillain Barr syndrome (HCC)    numbness in toes, legs hurt   Headache    migraines as a teenager   IBS (irritable bowel syndrome)    Lump, breast    removed years ago per Pt.   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 gets upset, has "quiet' seizures   Ht 5' 3.5" (1.613 m)    Wt 168 lb (76.2 kg) Comment: per patient  BMI 29.29 kg/m   Opioid Risk Score:   Fall Risk Score:  `1  Depression screen PHQ 2/9     05/09/2022    1:36 PM 04/04/2022    1:21 PM 02/20/2022   12:58 PM 05/24/2021    2:10 PM  Depression screen PHQ 2/9  Decreased Interest 0 0 0 0  Down, Depressed, Hopeless 0 0 0 1  PHQ - 2 Score 0 0 0 1  Altered sleeping   1   Tired, decreased energy   0   Change in appetite   0   Feeling bad or failure about yourself    0   Trouble concentrating   1   Moving slowly or fidgety/restless   3   Suicidal thoughts   0   PHQ-9 Score   5   Difficult doing work/chores   Not difficult at all      Review of Systems  Constitutional: Negative.   HENT: Negative.    Eyes: Negative.   Respiratory: Negative.    Cardiovascular: Negative.   Gastrointestinal: Negative.   Endocrine: Negative.   Genitourinary: Negative.   Musculoskeletal:  Positive for gait problem.  Skin: Negative.   Allergic/Immunologic: Negative.   Hematological: Negative.   Psychiatric/Behavioral:  Positive for sleep disturbance.       Objective:   Physical Exam Vitals and nursing note reviewed.  Musculoskeletal:     Comments: No Physical Exam: My Chart Visit         Assessment & Plan:  Guillain Barre Syndrome: Continue current medication regimen. Continue Outpatient Therapy. Continue current medication regimen. Continue to Monitor. 05/09/2022 Polyarthralgia: Continue current medication regimen . Continue to Monitor. 05/09/2022 Chronic Pain of Both Knees: Continue Outpatient Therapy. Continue current medication regimen. Continue to Monitor. 05/09/2022 Chronic Pain Syndrome: Continue Oxycodone 5 mg three times a day as needed for pain. #90. We will continue the opioid monitoring program, this consists of regular clinic visits, examinations, urine drug screen, pill counts as well as use of [place] Controlled Substance Reporting system. A 12 month History has been reviewed on  the [place] on 05/05/2022\ F/U in 1 month   Telephone Visit Established Patient Location of Patient: In Her Home Location of Provider: In the Office Total Time Spent: 15 Minutes

## 2022-05-19 ENCOUNTER — Other Ambulatory Visit: Payer: Self-pay | Admitting: Physical Medicine and Rehabilitation

## 2022-05-19 ENCOUNTER — Telehealth: Payer: Self-pay

## 2022-05-19 MED ORDER — OXYCODONE HCL 5 MG PO TABS
5.0000 mg | ORAL_TABLET | Freq: Three times a day (TID) | ORAL | 0 refills | Status: DC | PRN
Start: 2022-05-19 — End: 2022-06-08

## 2022-05-19 NOTE — Telephone Encounter (Signed)
Patient called for her pain medication refill.  Filled  Written  ID  Drug  QTY  Days  Prescriber  RX #  Dispenser  Refill  Daily Dose*  Pymt Type  PMP  04/10/2022 04/04/2022 1  Oxycodone Hcl (Ir) 5 Mg Tablet 90.00 30 Eu Tho [id number] [facility] (2069) 0/0 22.50 MME Medicare [place] 03/08/2022 03/08/2022 1  Oxycodone Hcl (Ir) 5 Mg Tablet 90.00 30 Eu Tho [id number] [facility] (2069) 0/0 22.50 MME Medicare [place]

## 2022-06-08 ENCOUNTER — Encounter: Payer: 59 | Attending: Physical Medicine and Rehabilitation | Admitting: Registered Nurse

## 2022-06-08 VITALS — BP 120/75 | HR 77 | Ht 63.5 in | Wt 176.4 lb

## 2022-06-08 DIAGNOSIS — G894 Chronic pain syndrome: Secondary | ICD-10-CM | POA: Diagnosis present

## 2022-06-08 DIAGNOSIS — M25561 Pain in right knee: Secondary | ICD-10-CM | POA: Diagnosis present

## 2022-06-08 DIAGNOSIS — G8929 Other chronic pain: Secondary | ICD-10-CM

## 2022-06-08 DIAGNOSIS — Z79891 Long term (current) use of opiate analgesic: Secondary | ICD-10-CM

## 2022-06-08 DIAGNOSIS — M2550 Pain in unspecified joint: Secondary | ICD-10-CM | POA: Diagnosis present

## 2022-06-08 DIAGNOSIS — M545 Low back pain, unspecified: Secondary | ICD-10-CM

## 2022-06-08 DIAGNOSIS — M25562 Pain in left knee: Secondary | ICD-10-CM

## 2022-06-08 DIAGNOSIS — Z5181 Encounter for therapeutic drug level monitoring: Secondary | ICD-10-CM | POA: Diagnosis present

## 2022-06-08 DIAGNOSIS — G610 Guillain-Barre syndrome: Secondary | ICD-10-CM | POA: Diagnosis present

## 2022-06-08 MED ORDER — OXYCODONE HCL 5 MG PO TABS: 5.0000 mg | ORAL_TABLET | Freq: Three times a day (TID) | ORAL | 0 refills | Status: DC | PRN

## 2022-06-08 NOTE — Progress Notes (Signed)
Subjective:    Patient ID: Jill Spence, female    DOB: 1951-05-03, 72 y.o.   MRN: [id number]  HPI: Jill Spence is a 71 y.o. female who returns for follow up appointment for chronic pain and medication refill. She states her pain is located in her lower back, bilateral knee pain and generalized joint pain. She also reports bilateral feet pain with tingling and burning. She rates her pain 5. Her current exercise regime is walking with her walker.   Ms. Daily Morphine equivalent is 15.25  MME.  UDS ordered today.   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      Pain Inventory Average Pain 7 Pain Right Now 5 My pain is aching  In the last 24 hours, has pain interfered with the following? General activity 4 Relation with others 0 Enjoyment of life 0 What TIME of day is your pain at its worst? night Sleep (in general) Poor  Pain is worse with: walking, sitting, and standing Pain improves with: therapy/exercise and medication Relief from Meds: 5  Family History  Problem Relation Age of Onset   Cancer Father    Social History   Socioeconomic History   Marital status: Widowed    Spouse name: Not on file   Number of children: Not on file   Years of education: Not on file   Highest education level: Not on file  Occupational History   Not on file  Tobacco Use   Smoking status: Former    Packs/day: 0.25    Years: 6.00    Total pack years: 1.50    Types: Cigarettes    Quit date: 10/14/2007    Years since quitting: 14.6   Smokeless tobacco: Never  Vaping Use   Vaping Use: Never used  Substance and Sexual Activity   Alcohol use: No   Drug use: No   Sexual activity: Not on file  Other Topics Concern   Not on file  Social History Narrative   Not on file   Social Determinants of Health   Financial Resource Strain: Not on file  Food Insecurity: Not on file  Transportation Needs: Not on file  Physical Activity: Not on file  Stress: Not on file  Social Connections: Not on  file   Past Surgical History:  Procedure Laterality Date   BREAST SURGERY     COLONOSCOPY     EYE SURGERY Bilateral    cataract surgery with lens implants   MANDIBLE RECONSTRUCTION     MINOR REMOVAL OF MANDIBULAR HARDWARE Right 10/15/2015   Procedure: MINOR REMOVAL OF Right MANDIBULAR HARDWARE;  Surgeon: Diona Browner, DDS;  Location: [facility];  Service: Oral Surgery;  Laterality: Right;   TONSILLECTOMY     Past Surgical History:  Procedure Laterality Date   BREAST SURGERY     COLONOSCOPY     EYE SURGERY Bilateral    cataract surgery with lens implants   MANDIBLE RECONSTRUCTION     MINOR REMOVAL OF MANDIBULAR HARDWARE Right 10/15/2015   Procedure: MINOR REMOVAL OF Right MANDIBULAR HARDWARE;  Surgeon: Diona Browner, DDS;  Location: [facility];  Service: Oral Surgery;  Laterality: Right;   TONSILLECTOMY     Past Medical History:  Diagnosis Date   Anemia    low iron   Anxiety    Arthritis    Bipolar disorder (HCC)    COPD (chronic obstructive pulmonary disease) (HCC)    Family history of adverse reaction to anesthesia    mom "was put to  sleep and she never woke up" -    GERD (gastroesophageal reflux disease)    Guillain Barr syndrome (HCC)    numbness in toes, legs hurt   Headache    migraines as a teenager   IBS (irritable bowel syndrome)    Lump, breast    removed years ago per Pt.   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 gets upset, has "quiet' seizures   BP 120/75   Pulse 77   Ht 5' 3.5" (1.613 m)   Wt 176 lb 6.4 oz (80 kg)   SpO2 96%   BMI 30.76 kg/m   Opioid Risk Score:   Fall Risk Score:  `1  Depression screen PHQ 2/9     05/09/2022    1:36 PM 04/04/2022    1:21 PM 02/20/2022   12:58 PM 05/24/2021    2:10 PM  Depression screen PHQ 2/9  Decreased Interest 0 0 0 0  Down, Depressed, Hopeless 0 0 0 1  PHQ - 2 Score 0 0 0 1  Altered sleeping   1   Tired, decreased energy   0   Change in appetite   0   Feeling bad or  failure about yourself    0   Trouble concentrating   1   Moving slowly or fidgety/restless   3   Suicidal thoughts   0   PHQ-9 Score   5   Difficult doing work/chores   Not difficult at all      Review of Systems  Musculoskeletal:        Bilateral knee pain Bilateral foot pain Bilateral elbow pain  All other systems reviewed and are negative.     Objective:   Physical Exam        Assessment & Plan:  Guillain Barre Syndrome: Continue current medication regimen. Continue Outpatient Therapy. Continue current medication regimen. Continue to Monitor. 06/08/2022 Polyarthralgia: Continue current medication regimen . Continue to Monitor. 06/08/2022 Chronic Pain of Both Knees: Continue Outpatient Therapy. Continue current medication regimen. Continue to Monitor. 06/08/2022 Chronic Pain Syndrome: Continue Oxycodone 5 mg three times a day as needed for pain. #90. We will continue the opioid monitoring program, this consists of regular clinic visits, examinations, urine drug screen, pill counts as well as use of [place] Controlled Substance Reporting system. A 12 month History has been reviewed on the [facility] on 06/08/2022  F/U in 2 month

## 2022-06-13 LAB — TOXASSURE SELECT,+ANTIDEPR,UR

## 2022-06-18 ENCOUNTER — Encounter: Payer: Self-pay | Admitting: Registered Nurse

## 2022-06-19 ENCOUNTER — Encounter: Payer: Self-pay | Admitting: Pulmonary Disease

## 2022-06-19 ENCOUNTER — Telehealth: Payer: Self-pay | Admitting: *Deleted

## 2022-06-19 NOTE — Progress Notes (Signed)
Received note from primary care dated June 17, 2019  As per note patient would like to change pulmonologist to someone she has already seen in [facility].  Her primary care is working on making that referral.  We can fax any records when requested.  Marshell Garfinkel MD [facility] 06/19/2022, 11:30 AM

## 2022-06-19 NOTE — Telephone Encounter (Signed)
Urine drug screen for this encounter is consistent for prescribed medication 

## 2022-07-04 ENCOUNTER — Ambulatory Visit: Payer: 59 | Admitting: Internal Medicine

## 2022-07-11 ENCOUNTER — Ambulatory Visit (INDEPENDENT_AMBULATORY_CARE_PROVIDER_SITE_OTHER): Payer: 59 | Admitting: Internal Medicine

## 2022-07-11 ENCOUNTER — Encounter: Payer: Self-pay | Admitting: Internal Medicine

## 2022-07-11 VITALS — BP 118/76 | HR 100 | Ht 63.5 in | Wt 176.0 lb

## 2022-07-11 DIAGNOSIS — E0590 Thyrotoxicosis, unspecified without thyrotoxic crisis or storm: Secondary | ICD-10-CM

## 2022-07-11 DIAGNOSIS — E559 Vitamin D deficiency, unspecified: Secondary | ICD-10-CM

## 2022-07-11 DIAGNOSIS — E042 Nontoxic multinodular goiter: Secondary | ICD-10-CM | POA: Diagnosis not present

## 2022-07-11 LAB — T3, FREE: T3, Free: 2.4 pg/mL (ref 2.3–4.2)

## 2022-07-11 LAB — T4, FREE: Free T4: 0.62 ng/dL (ref 0.60–1.60)

## 2022-07-11 LAB — TSH: TSH: 0.69 u[IU]/mL (ref 0.35–5.50)

## 2022-07-11 MED ORDER — METHIMAZOLE 5 MG PO TABS: 5.0000 mg | ORAL_TABLET | Freq: Every day | ORAL | 3 refills | Status: DC

## 2022-07-11 NOTE — Progress Notes (Addendum)
Patient ID: Jill Spence, female   DOB: 02/09/1951, 71 y.o.   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  She previously saw Dr. Loanne Drilling, last visit 4 months ago.  She is here with her mental health caregiver.  Thyrotoxicosis: Patient was found to have abnormal thyroid tests at least in 2018 -per my review of the records.  She is currently on methimazole 5 mg daily.  I reviewed pt's thyroid tests: Lab Results  Component Value Date   TSH 0.59 03/23/2022   TSH 0.81 12/22/2021   TSH 0.32 (L) 09/21/2021   TSH 0.70 07/26/2021   TSH 1.91 04/26/2021   TSH <0.01 (L) 03/15/2021   TSH <0.01 Repeated and verified X2. (L) 10/19/2020   FREET4 0.65 03/23/2022   FREET4 0.56 (L) 12/22/2021   FREET4 0.51 (L) 09/21/2021   FREET4 0.57 (L) 07/26/2021   FREET4 0.37 (L) 04/26/2021   FREET4 0.51 (L) 03/15/2021   FREET4 0.81 10/19/2020  No results found for: "T3FREE"   Ref Range & Units 4 yr ago  Thyroid Stimulating Hormone (TSH) 0.34 - 5.66 IU/mL 0.04 Low    Thyroxine, Free (FT4) 0.52 - 1.21 ng/dL 0.66   Resulting Agency  [facility]   Specimen Collected: 09/24/17 11:35 Last Resulted: 09/24/17 13:59  Received From: [facility]  Result Received: 03/15/21 13:35   Antithyroid antibodies: No results found for: "TSI"  Multiple thyroid nodules:  Thyroid U/S (10/26/2020): Parenchymal Echotexture: Moderately heterogenous  Isthmus: 0.3 cm  Right lobe: 4.6 x 1.5 x 1.6 cm  Left lobe: 4.4 x 2.3 x 2.4 cm  _________________________________________________________   Estimated total number of nodules >/= 1 cm: 1 _________________________________________________________   Nodule # 1:  Location: Right; Inferior  Maximum size: 0.9 cm; Other 2 dimensions: 0.6 x 0.5 cm  Composition: solid/almost completely solid (2)   Echogenicity: hypoechoic (2) Given size (<0.9 cm) and appearance, this nodule does NOT meet TI-RADS criteria for biopsy or dedicated follow-up.  _________________________________________________________   Nodule # 2:  Location: Right; Mid  Maximum size: 0.8 cm; Other 2 dimensions: 0.5 x 0 5 cm  Composition: spongiform (0)  Echogenicity: isoechoic (1)  This nodule does NOT meet TI-RADS criteria for biopsy or dedicated follow-up.  _________________________________________________________   Nodule # 3:  Location: Right; Superior medial  Maximum size: 0.6 cm; Other 2 dimensions: 0.5 x 0.5 cm  Composition: solid/almost completely solid (2)  Echogenicity: hypoechoic (2) Given size (<0.9 cm) and appearance, this nodule does NOT meet TI-RADS criteria for biopsy or dedicated follow-up.  _________________________________________________________   Nodule # 4:  Location: Right; Superior  Maximum size: 0.8 cm; Other 2 dimensions: 0.5 x 0.7 cm  Composition: mixed cystic and solid (1)  Echogenicity: hypoechoic (2)  Given size (<1.4 cm) and appearance, this nodule does NOT meet TI-RADS criteria for biopsy or dedicated follow-up.  _________________________________________________________   Nodule # 5:  Location: Left; Inferior  Maximum size: 2.9 cm; Other 2 dimensions: 2.3 x 2.2 cm  Composition: mixed cystic and solid (1)  Echogenicity: hypoechoic (2) Margins: lobulated/irregular (2)  Echogenic foci: macrocalcifications (1)  ACR TI-RADS total points: 6. ACR TI-RADS risk category: TR4 (4-6 points)  **Given size (>/= 1.5 cm) and appearance, fine needle aspiration of this moderately suspicious nodule should be considered based on TI-RADS criteria.  _________________________________________________________   Nodule # 6:  Location: Left; Superior  Maximum size: 0.7 cm; Other 2 dimensions: 0.5 x 0.7 cm  Composition: solid/almost completely solid (2)  Echogenicity: hypoechoic (2) Given  size (<0.9 cm) and appearance, this nodule does NOT meet TI-RADS criteria for biopsy or dedicated follow-up.  ________________________________________________________   IMPRESSION: 1. Multinodular goiter. 2. Dominant nodule in the left inferior thyroid measuring up to 2.9 cm (labeled 5) which meets criteria (TI-RADS category 4) for tissue sampling. Recommend ultrasound-guided fine-needle aspiration. 3. Additional scattered thyroid nodules appear benign. These nodules require additional dedicated follow-up.    Thyroid uptake and scan (01/13/2021): Dominant hot nodule in the left thyroid lobe with suppression of the  rest of the gland. Findings suggest a hyperfunctioning thyroid  nodule.  4 hour I-131 uptake = 10.1% (normal 5-20%)   24 hour I-131 uptake = 22.4% (normal 10-30%)   IMPRESSION:  Findings suggest a hyperfunctioning nodule in the left thyroid lobe  suppressing the rest of the gland.   PET scan (05/01/2021): The thyroid nodules were not FDG avid  Pt mentions that she has worsening: - + hoarseness - + dysphagia - + choking - + SOB with lying down  She  has occasional: - weakness - numbness - will see neurology - heat intolerance  No: - tremors - anxiety - palpitations - hyperdefecation - weight loss  Pt does have a FH of thyroid ds.: nieces. No FH of thyroid cancer. No h/o radiation tx to head or neck. + steroid use - Prednisone 10 mg daily prn - not taken today. No herbal supplements.  Not on a B complex.  Vitamin D deficiency:  Reviewed vitamin D levels: Lab Results  Component Value Date   VD25OH 55.55 03/23/2022   VD25OH 55.92 12/22/2021   VD25OH 78.75 09/21/2021   VD25OH 57.48 07/26/2021   On vitamin D 50,000 units weekly.  She has IBS -diarrhea.  ROS: + see HPI  Past Medical History:  Diagnosis Date   Anemia    low iron   Anxiety    Arthritis    Bipolar disorder (HCC)    COPD (chronic obstructive pulmonary disease) (HCC)    Family  history of adverse reaction to anesthesia    mom "was put to sleep and she never woke up" -    GERD (gastroesophageal reflux disease)    Guillain Barr syndrome (HCC)    numbness in toes, legs hurt   Headache    migraines as a teenager   IBS (irritable bowel syndrome)    Lump, breast    removed years ago per Pt.   Pneumonia    Restless leg syndrome    Seizures ([place])    only has one when she gets upset, has "quiet' seizures   Past Surgical History:  Procedure Laterality Date   BREAST SURGERY     COLONOSCOPY     EYE SURGERY Bilateral    cataract surgery with lens implants   MANDIBLE RECONSTRUCTION     MINOR REMOVAL OF MANDIBULAR HARDWARE Right 10/15/2015   Procedure: MINOR REMOVAL OF Right MANDIBULAR HARDWARE;  Surgeon: Diona Browner, DDS;  Location: [facility];  Service: Oral Surgery;  Laterality: Right;   TONSILLECTOMY     Social History   Socioeconomic History   Marital status: Widowed    Spouse name: Not on file   Number of children: Not on file   Years of education: Not on file   Highest education level: Not on file  Occupational History   Not on file  Tobacco Use   Smoking status:  Former    Packs/day: 0.25    Years: 6.00    Total pack years: 1.50    Types: Cigarettes    Quit date: 10/14/2007    Years since quitting: 14.7   Smokeless tobacco: Never  Vaping Use   Vaping Use: Never used  Substance and Sexual Activity   Alcohol use: No   Drug use: No   Sexual activity: Not on file  Other Topics Concern   Not on file  Social History Narrative   Not on file   Social Determinants of Health   Financial Resource Strain: Not on file  Food Insecurity: Not on file  Transportation Needs: Not on file  Physical Activity: Not on file  Stress: Not on file  Social Connections: Not on file  Intimate Partner Violence: Not on file   Current Outpatient Medications on File Prior to Visit  Medication Sig Dispense Refill   albuterol (PROVENTIL HFA;VENTOLIN HFA) 108 (90 BASE)  MCG/ACT inhaler Inhale 2 puffs into the lungs every 6 (six) hours as needed for wheezing.     albuterol (PROVENTIL) (2.5 MG/3ML) 0.083% nebulizer solution Take 2.5 mg by nebulization every 6 (six) hours as needed for wheezing.     alendronate (FOSAMAX) 70 MG tablet Take 70 mg by mouth every Sunday. Take with a full glass of water on an empty stomach.     ARIPiprazole (ABILIFY) 15 MG tablet Take 15 mg by mouth daily.     B Complex Vitamins (VITAMIN B COMPLEX) TABS Take 1 tablet by mouth daily. 30 tablet 5   benzonatate (TESSALON) 100 MG capsule Take 1 capsule (100 mg total) by mouth 3 (three) times daily as needed for cough. 90 capsule 1   benztropine (COGENTIN) 0.5 MG tablet Take 0.5 mg by mouth 2 (two) times daily.     cetirizine (ZYRTEC) 10 MG tablet Take 10 mg by mouth daily.     Cholecalciferol (VITAMIN D3) 125 MCG (5000 UT) CAPS Take 1 capsule (5,000 Units total) by mouth daily. 30 capsule 11   cromolyn (OPTICROM) 4 % ophthalmic solution Place 1 drop into both eyes 4 times daily.     famciclovir (FAMVIR) 250 MG tablet Take 250 mg by mouth 2 (two) times daily.     Fluticasone-Umeclidin-Vilant (TRELEGY ELLIPTA) 100-62.5-25 MCG/INH AEPB Inhale into the lungs.     hydrOXYzine (ATARAX/VISTARIL) 25 MG tablet Take 25 mg by mouth 2 (two) times daily.     ipratropium-albuterol (DUONEB) 0.5-2.5 (3) MG/3ML SOLN Take 3 mLs by nebulization every 4 (four) hours as needed. 360 mL 5   magnesium oxide (MAG-OX) 400 MG tablet Take 250 mg by mouth daily.     meloxicam (MOBIC) 7.5 MG tablet Take 7.5 mg by mouth daily.     methimazole (TAPAZOLE) 5 MG tablet Take 1 tablet (5 mg total) by mouth daily. 90 tablet 3   montelukast (SINGULAIR) 10 MG tablet Take 10 mg by mouth at bedtime.     oxcarbazepine (TRILEPTAL) 600 MG tablet Take 600 mg by mouth 2 (two) times daily.     oxyCODONE (ROXICODONE) 5 MG immediate release tablet Take 1 tablet (5 mg total) by mouth 3 (three) times daily as needed for severe pain. Do Not  Fill Before 06/18/2022 90 tablet 0   pantoprazole (PROTONIX) 40 MG tablet Take 40 mg by mouth daily.     potassium chloride SA (KLOR-CON) 20 MEQ tablet Take 1 tablet (20 mEq total) by mouth 2 (two) times daily for 7 days. 14 tablet 0  predniSONE (DELTASONE) 10 MG tablet Take 10 mg by mouth daily with breakfast.     predniSONE (DELTASONE) 20 MG tablet Take '40mg'$  for 5 days 10 tablet 0   ranitidine (ZANTAC) 300 MG capsule Take 300 mg by mouth every evening.     traZODone (DESYREL) 50 MG tablet Take 50 mg by mouth at bedtime.     Vitamin A 2250 MCG (7500 UT) CAPS Take 1 tablet by mouth daily. 30 capsule 5   [DISCONTINUED] gabapentin (NEURONTIN) 300 MG capsule Take 600 mg by mouth 2 (two) times daily.     No current facility-administered medications on file prior to visit.   Allergies  Allergen Reactions   Aspirin    Influenza Vaccines    Family History  Problem Relation Age of Onset   Cancer Father     PE: BP 118/76 (BP Location: Left Arm, Patient Position: Sitting, Cuff Size: Small)   Pulse 100   Ht 5' 3.5" (1.613 m)   Wt 176 lb (79.8 kg)   SpO2 93%   BMI 30.69 kg/m  Wt Readings from Last 3 Encounters:  07/11/22 176 lb (79.8 kg)  06/08/22 176 lb 6.4 oz (80 kg)  05/09/22 168 lb (76.2 kg)   Constitutional: overweight, in NAD, walks with a walker, on oxygen Eyes:  EOMI, no exophthalmos, no lid lag, no stare ENT: moist mucous membranes, no thyromegaly and no thyroid nodules palpated, no thyroid bruits, no cervical lymphadenopathy Cardiovascular: No tachycardia the time of the physical exam, RRR, No MRG Respiratory: CTA B Musculoskeletal: no deformities Skin: moist, warm, no rashes Neurological: no tremor with outstretched hands, DTR normal in all 4  ASSESSMENT: 1.  Toxic adenoma with thyrotoxicosis  2.  Thyroid nodules  3.  Vitamin D deficiency  PLAN:  1. Patient with a history of thyrotoxicosis, with thyrotoxic sxs: weight loss, heat intolerance, hyperdefecation,  palpitations, anxiety.  -Patient had a thyroid uptake and scan in 2022 and this showed a left thyroid hyperfunctioning adenoma as a possible cause for her thyrotoxicosis - she is treated with methimazole, with good results - latest TFTs were normal 03/2022 - will recheck the TSH, fT3 and fT4  - she is on prednisone, but did not take the dose today.  She has B complex on her medication list but she tells me she is not taking this.  Therefore, we can check her TFTs today. - We discussed that if the tests remain are abnormal, will need to change her methimazole dose.  However, we also discussed about definitive treatment of her hyperthyroidism with radioactive iodine ablation or (last resort) surgery.  She would be open to RAI treatment if necessary. - RTC in 6 months  2.  Thyroid nodules -Patient has a history of subcentimeter right thyroid nodules, not worrisome, but also a large left thyroid nodule that showed lobulation and macrocalcifications.  This is most likely the site of thyroid hormone overproduction, and we discussed that overactive nodules are rarely malignant, however, I would like to check another ultrasound to see if this nodule changed characteristics.  We may need a biopsy afterwards.  However, patient is adamant that she would not want a biopsy.  She is afraid of needles and mentions that she is afraid that she would form keloid at the site.  She tells me that thyroid surgery is really not an option for her due to the poor lung status. -She also tells me that she has worsening neck compression symptoms including dysphagia, shortness of breath, choking. -  We discussed that RAI treatment is an option, which can shrink thyroid nodules and the thyroid gland in general, however, before proceeding with this, we will need to see how the left thyroid nodule appears on ultrasound.   3.  Vitamin D deficiency -Latest vitamin D level was normal in 03/2022 -She continues on ergocalciferol 50,000  units daily   Orders Placed This Encounter  Procedures   US THYROID   TSH   T4, free   T3, free   Needs refills of methimazole.  - Total time spent for the visit: 40 min, in precharting, reviewing Dr. Cordelia Pen last note, obtaining medical information from the chart and from the pt, reviewing her  previous labs, evaluations, and treatments, reviewing her symptoms, counseling her about her diabetes (please see the discussed topics above), and developing a plan to further treat it; she had a number of questions which I addressed.  Component     Latest Ref Rng 07/11/2022  T4,Free(Direct)     0.60 - 1.60 ng/dL 0.62   TSH     0.35 - 5.50 uIU/mL 0.69   Triiodothyronine,Free,Serum     2.3 - 4.2 pg/mL 2.4    TFTs are normal >> will refill her methimazole.  Thyroid U/S (07/18/2022): Parenchymal Echotexture: Moderately heterogenous  Isthmus: 0.3 cm  Right lobe: 4.1 cm x 1.4 cm x 1.9 cm  Left lobe: 4.6 cm x 2.5 cm x 2.4 cm  ____________________________________________________   Nodule labeled 1, superior right thyroid, 6 mm. Nodule has cystic/spongiform characteristics and does not meet criteria for surveillance.   Nodule labeled 2, superior right thyroid, 8 mm. Nodule has cystic/spongiform characteristics and does not meet criteria for surveillance.   Nodule labeled 3, mid right thyroid, 8 mm. Nodule has spongiform/cystic characteristics and does not meet criteria for surveillance.   Nodule labeled 4, inferior right thyroid, 7 mm, decreased in size, TR 4 characteristics. Nodule does not meet criteria for surveillance.   Nodule labeled 5, mid left thyroid, unchanged 7 mm. Nodule has TR 4 characteristics and does not meet criteria for surveillance.   Nodule labeled 6, inferior left thyroid, 3.1 cm. Nodule has TR 4 characteristics and meets criteria for biopsy.   No adenopathy   IMPRESSION: Multinodular thyroid again demonstrated.   Left inferior thyroid nodule again meets  criteria for biopsy, as designated by the newly established ACR TI-RADS criteria, and referral for biopsy is recommended.    We will check with her if she accepts to have a biopsy of the L inferior thyroid nodule.  Philemon Kingdom, MD PhD [facility]

## 2022-07-11 NOTE — Patient Instructions (Signed)
Please stop at the lab.  We will check another thyroid U/S.  You should have an endocrinology follow-up appointment in 6 months.

## 2022-07-18 ENCOUNTER — Ambulatory Visit
Admission: RE | Admit: 2022-07-18 | Discharge: 2022-07-18 | Disposition: A | Discharge status: Home / Self Care | Payer: 59 | Source: Ambulatory Visit | Attending: Internal Medicine | Admitting: Internal Medicine

## 2022-07-18 DIAGNOSIS — E042 Nontoxic multinodular goiter: Secondary | ICD-10-CM

## 2022-07-19 ENCOUNTER — Telehealth: Payer: Self-pay | Admitting: Registered Nurse

## 2022-07-19 ENCOUNTER — Telehealth: Payer: Self-pay

## 2022-07-19 MED ORDER — OXYCODONE HCL 5 MG PO TABS: 5.0000 mg | ORAL_TABLET | Freq: Three times a day (TID) | ORAL | 0 refills | Status: DC | PRN

## 2022-07-19 NOTE — Telephone Encounter (Signed)
Patient would like the Oxycodone refill to go the [facility] (mail order).   Filled  Written  ID  Drug  QTY  Days  Prescriber  RX #  Dispenser  Refill  Daily Dose*  Pymt Type  PMP  06/19/2022 06/08/2022 2  Oxycodone Hcl (Ir) 5 Mg Tablet 90.00 30 Eu Tho [id number] [facility] (2069) 0/0 22.50 MME Medicare [place]  Thank you

## 2022-07-19 NOTE — Telephone Encounter (Addendum)
PMP was Reviewed  Jill.  Spence states her pharmacy is out of the oxycodone,  Oxycodone prescription sent to [facility] per patients request.  Jill Spence is aware of the above and verbalizes understanding.

## 2022-07-19 NOTE — Addendum Note (Signed)
Addended by: Bayard Hugger on: 07/19/2022 02:30 PM   Modules accepted: Orders

## 2022-07-19 NOTE — Telephone Encounter (Signed)
They have called different places mentioned something about a mail order pharmacy

## 2022-07-20 ENCOUNTER — Other Ambulatory Visit: Payer: Self-pay | Admitting: Physical Medicine and Rehabilitation

## 2022-07-20 MED ORDER — OXYCODONE HCL 5 MG PO TABS: 5.0000 mg | ORAL_TABLET | Freq: Three times a day (TID) | ORAL | 0 refills | Status: DC | PRN

## 2022-08-10 ENCOUNTER — Encounter: Payer: 59 | Attending: Physical Medicine and Rehabilitation | Admitting: Registered Nurse

## 2022-08-10 ENCOUNTER — Encounter: Payer: Self-pay | Admitting: Registered Nurse

## 2022-08-10 VITALS — BP 138/86 | HR 73 | Ht 63.5 in | Wt 178.0 lb

## 2022-08-10 DIAGNOSIS — Z79891 Long term (current) use of opiate analgesic: Secondary | ICD-10-CM | POA: Insufficient documentation

## 2022-08-10 DIAGNOSIS — Z5181 Encounter for therapeutic drug level monitoring: Secondary | ICD-10-CM | POA: Diagnosis present

## 2022-08-10 DIAGNOSIS — M25562 Pain in left knee: Secondary | ICD-10-CM | POA: Insufficient documentation

## 2022-08-10 DIAGNOSIS — G894 Chronic pain syndrome: Secondary | ICD-10-CM | POA: Diagnosis present

## 2022-08-10 DIAGNOSIS — M545 Low back pain, unspecified: Secondary | ICD-10-CM | POA: Diagnosis present

## 2022-08-10 DIAGNOSIS — G8929 Other chronic pain: Secondary | ICD-10-CM | POA: Insufficient documentation

## 2022-08-10 DIAGNOSIS — M2550 Pain in unspecified joint: Secondary | ICD-10-CM | POA: Insufficient documentation

## 2022-08-10 DIAGNOSIS — G610 Guillain-Barre syndrome: Secondary | ICD-10-CM | POA: Diagnosis present

## 2022-08-10 DIAGNOSIS — M25561 Pain in right knee: Secondary | ICD-10-CM | POA: Insufficient documentation

## 2022-08-10 MED ORDER — OXYCODONE HCL 5 MG PO TABS
5.0000 mg | ORAL_TABLET | Freq: Three times a day (TID) | ORAL | 0 refills | Status: DC | PRN
Start: 2022-08-10 — End: 2022-08-10

## 2022-08-10 MED ORDER — OXYCODONE HCL 5 MG PO TABS: 5.0000 mg | ORAL_TABLET | Freq: Three times a day (TID) | ORAL | 0 refills | Status: DC | PRN

## 2022-08-10 NOTE — Progress Notes (Signed)
 Subjective:    Patient ID: Jill Spence, female    DOB: 02/23/1951, 71 y.o.   MRN: [id number]  Jill Spence is a 71 y.o. female who returns for follow up appointment for chronic pain and medication refill. She states her pain is located in  her lower back, bilateral knee pain and generalized joint pain. She rates her pain 9. Her current exercise regime is attending physical therapy two days a week and walking with her walker.  Jill Spence equivalent is 22.50 MME.   Last UDS was Performed on 02/20/2022, it was consistent.  Jill Spence case manager in in the room.    Pain Inventory Average Pain 10 Pain Right Now 9 My pain is intermittent, dull, stabbing, and throb, tingling, ache  In the last 24 hours, has pain interfered with the following? General activity 5 Relation with others 0 Enjoyment of life 10 What TIME of day is your pain at its worst? night Sleep (in general) Poor  Pain is worse with: walking, bending, sitting, and some activites Pain improves with: rest, therapy/exercise, and medication Relief from Meds: 8      Family History  Problem Relation Age of Onset   Cancer Jill Spence    Social History   Socioeconomic History   Marital status: Widowed    Spouse name: Not on file   Number of children: Not on file   Years of education: Not on file   Highest education level: Not on file  Occupational History   Not on file  Tobacco Use   Smoking status: Former    Packs/day: 0.25    Years: 6.00    Total pack years: 1.50    Types: Cigarettes    Quit date: 10/14/2007    Years since quitting: 14.8   Smokeless tobacco: Never  Vaping Use   Vaping Use: Never used  Substance and Sexual Activity   Alcohol use: No   Drug use: No   Sexual activity: Not on file  Other Topics Concern   Not on file  Social History Narrative   Not on file   Social Determinants of Health   Financial Resource Strain: Not on file  Food Insecurity: Not on file   Transportation Needs: Not on file  Physical Activity: Not on file  Stress: Not on file  Social Connections: Not on file   Past Surgical History:  Procedure Laterality Date   BREAST SURGERY     COLONOSCOPY     EYE SURGERY Bilateral    cataract surgery with lens implants   MANDIBLE RECONSTRUCTION     MINOR REMOVAL OF MANDIBULAR HARDWARE Right 10/15/2015   Procedure: MINOR REMOVAL OF Right MANDIBULAR HARDWARE;  Surgeon: Ascencion Lava, DDS;  Location: [facility];  Service: Oral Surgery;  Laterality: Right;   TONSILLECTOMY     Past Medical History:  Diagnosis Date   Anemia    low iron   Anxiety    Arthritis    Bipolar disorder (HCC)    COPD (chronic obstructive pulmonary disease) (HCC)    Family history of adverse reaction to anesthesia    mom "was put to sleep and she never woke up" -    GERD (gastroesophageal reflux disease)    Guillain Barr syndrome (HCC)    numbness in toes, legs hurt   Headache    migraines as a teenager   IBS (irritable bowel syndrome)    Lump, breast    removed years ago per Pt.   Pneumonia  Restless leg syndrome    Seizures (HCC)    only has one when she gets upset, has "quiet' seizures   There were no vitals taken for this visit.  Opioid Risk Score:   Fall Risk Score:  `1  Depression screen PHQ 2/9     05/09/2022    1:36 PM 04/04/2022    1:21 PM 02/20/2022   12:58 PM 05/24/2021    2:10 PM  Depression screen PHQ 2/9  Decreased Interest 0 0 0 0  Down, Depressed, Hopeless 0 0 0 1  PHQ - 2 Score 0 0 0 1  Altered sleeping   1   Tired, decreased energy   0   Change in appetite   0   Feeling bad or failure about yourself    0   Trouble concentrating   1   Moving slowly or fidgety/restless   3   Suicidal thoughts   0   PHQ-9 Score   5   Difficult doing work/chores   Not difficult at all     Review of Systems  Musculoskeletal:  Positive for back pain and gait problem.       Right knee pain, pain in both hands, pain in both elbows, toes  cramping   All other systems reviewed and are negative.      Objective:   Physical Exam Vitals and nursing note reviewed.  Constitutional:      Appearance: Normal appearance.  Cardiovascular:     Rate and Rhythm: Normal rate and regular rhythm.     Pulses: Normal pulses.     Heart sounds: Normal heart sounds.  Pulmonary:     Effort: Pulmonary effort is normal.     Breath sounds: Normal breath sounds.     Comments: Continue Oxygen @ 3 liters nasal cannula Musculoskeletal:     Cervical back: Normal range of motion and neck supple.     Comments: Normal Muscle Bulk and Muscle Testing Reveals:  Upper Extremities: Full ROM and Muscle Strength 5/5 Lumbar Paraspinal Tenderness: L-4-L-5 Lower Extremities: Right:Decreased ROM and Muscle Strength 5/5 Right lower extremity flexion produces pain in her Right Patella Left Lower Extremity: Full ROM and Muscle Strength 5/5 Arises from Table Slowly using walker for support Narrow Based Gait     Skin:    General: Skin is warm and dry.  Neurological:     Mental Status: She is alert and oriented to person, place, and time.  Psychiatric:        Mood and Affect: Mood normal.        Behavior: Behavior normal.         Assessment & Plan:  Guillain Barre Syndrome: Continue current medication regimen. Continue Outpatient Therapy. Continue current medication regimen. Continue to Monitor. 08/10/2022 Polyarthralgia: Continue current medication regimen . Continue to Monitor. 08/10/2022 Chronic Pain of Both Knees: Continue Outpatient Therapy. Continue current medication regimen. Continue to Monitor. 08/10/2022 Chronic Pain Syndrome: Refilled" Oxycodone  5 mg three times a day as needed for pain. #90. Second script sent for the following month. We will continue the opioid monitoring program, this consists of regular clinic visits, examinations, urine drug screen, pill counts as well as use of [place]  Controlled Substance Reporting system. A 12  month History has been reviewed on the [place]  Controlled Substance Reporting System on 08/10/2022   F/U in 2 month

## 2022-08-12 ENCOUNTER — Encounter: Payer: Self-pay | Admitting: Registered Nurse

## 2022-08-14 ENCOUNTER — Telehealth: Payer: Self-pay | Admitting: Internal Medicine

## 2022-08-14 ENCOUNTER — Other Ambulatory Visit: Payer: Self-pay | Admitting: Internal Medicine

## 2022-08-14 DIAGNOSIS — E042 Nontoxic multinodular goiter: Secondary | ICD-10-CM

## 2022-08-14 NOTE — Telephone Encounter (Signed)
T, On the most recent thyroid ultrasound, she had a persistently large thyroid nodule, which would need biopsy.  I know she is reticent to have this, but I would really recommended due to the size and appearance of the nodule.   The thyroid labs were normal at last check so we continued the same methimazole dose. Can you please see what other questions she has?

## 2022-08-14 NOTE — Telephone Encounter (Signed)
T,  The TFTs are normal, so not likely to cause any diarrhea. C

## 2022-08-14 NOTE — Telephone Encounter (Signed)
Patient's cousin and POA, Fonnie Mu, called with patient on line as well requesting to speak with Dr. Cruzita Lederer concerning the results of her most recent labs and the appointment to be set up for the scan.  Both patient and Ms. Holder have questions that they would like answered.

## 2022-08-14 NOTE — Telephone Encounter (Signed)
Outbound call to Otila Kluver and she advised that the pt is experiencing diarrhea wanted to discuss pt recent labs. Advised of recent lab results. Pt did confirm she will get the biopsy done. Pt requested Otila Kluver be the point of contact for scheduling her biopsy.

## 2022-08-21 ENCOUNTER — Telehealth: Payer: Self-pay

## 2022-08-21 NOTE — Telephone Encounter (Signed)
Patient called stating she is having a procedure done and would like to talk to [place].

## 2022-08-22 NOTE — Telephone Encounter (Signed)
Return Ms. Eagon call,  She states she is scheduled to have Thyroid biopsy, on September 13, 2022, she stating they asked for this provider to prescribe clonazepam. Ms. Fishburn was instructed to have the doctor to send a note to our office. She verbalizes understanding.

## 2022-09-07 ENCOUNTER — Other Ambulatory Visit: Payer: 59

## 2022-09-13 ENCOUNTER — Other Ambulatory Visit (HOSPITAL_COMMUNITY)
Admission: RE | Admit: 2022-09-13 | Discharge: 2022-09-13 | Disposition: A | Discharge status: Home / Self Care | Payer: 59 | Source: Ambulatory Visit | Attending: Internal Medicine | Admitting: Internal Medicine

## 2022-09-13 ENCOUNTER — Ambulatory Visit
Admission: RE | Admit: 2022-09-13 | Discharge: 2022-09-13 | Disposition: A | Discharge status: Home / Self Care | Payer: 59 | Source: Ambulatory Visit | Attending: Internal Medicine | Admitting: Internal Medicine

## 2022-09-13 DIAGNOSIS — E042 Nontoxic multinodular goiter: Secondary | ICD-10-CM | POA: Diagnosis present

## 2022-09-13 DIAGNOSIS — R896 Abnormal cytological findings in specimens from other organs, systems and tissues: Secondary | ICD-10-CM | POA: Diagnosis not present

## 2022-09-19 LAB — CYTOLOGY - NON PAP

## 2022-10-03 ENCOUNTER — Encounter (HOSPITAL_COMMUNITY): Payer: Self-pay

## 2022-10-05 ENCOUNTER — Encounter: Payer: 59 | Attending: Physical Medicine and Rehabilitation | Admitting: Registered Nurse

## 2022-10-05 ENCOUNTER — Encounter: Payer: Self-pay | Admitting: Registered Nurse

## 2022-10-05 VITALS — BP 135/88 | HR 69 | Ht 63.5 in | Wt 172.0 lb

## 2022-10-05 DIAGNOSIS — M25561 Pain in right knee: Secondary | ICD-10-CM | POA: Insufficient documentation

## 2022-10-05 DIAGNOSIS — G610 Guillain-Barre syndrome: Secondary | ICD-10-CM | POA: Insufficient documentation

## 2022-10-05 DIAGNOSIS — M2550 Pain in unspecified joint: Secondary | ICD-10-CM | POA: Insufficient documentation

## 2022-10-05 DIAGNOSIS — G894 Chronic pain syndrome: Secondary | ICD-10-CM | POA: Diagnosis present

## 2022-10-05 DIAGNOSIS — M25562 Pain in left knee: Secondary | ICD-10-CM | POA: Diagnosis present

## 2022-10-05 DIAGNOSIS — G8929 Other chronic pain: Secondary | ICD-10-CM | POA: Insufficient documentation

## 2022-10-05 DIAGNOSIS — Z5181 Encounter for therapeutic drug level monitoring: Secondary | ICD-10-CM | POA: Diagnosis present

## 2022-10-05 DIAGNOSIS — Z79891 Long term (current) use of opiate analgesic: Secondary | ICD-10-CM | POA: Insufficient documentation

## 2022-10-05 MED ORDER — OXYCODONE HCL 5 MG PO TABS: 5.0000 mg | ORAL_TABLET | Freq: Three times a day (TID) | ORAL | 0 refills | Status: DC | PRN

## 2022-10-05 NOTE — Progress Notes (Signed)
Subjective:    Patient ID: Jill Spence, female    DOB: 1951-08-09, 71 y.o.   MRN: [id number]  HPI: Jill Spence is a 71 y.o. female who returns for follow up appointment for chronic pain and medication refill. She states her pain is located in her bilateral knees and generalized joint pain. She rates her pain 8. Her current exercise regime is walking and performing stretching exercises.  Jill Spence Morphine equivalent is 22.50 MME.   UDS ordered today     Pain Inventory Average Pain 8 Pain Right Now 8 My pain is intermittent, sharp, and aching  In the last 24 hours, has pain interfered with the following? General activity 0 Relation with others 0 Enjoyment of life 5 What TIME of day is your pain at its worst? evening Sleep (in general) Poor  Pain is worse with: some activites Pain improves with: medication & heat Relief from Meds: 10  Family History  Problem Relation Age of Onset   Cancer Father    Social History   Socioeconomic History   Marital status: Widowed    Spouse name: Not on file   Number of children: Not on file   Years of education: Not on file   Highest education level: Not on file  Occupational History   Not on file  Tobacco Use   Smoking status: Former    Packs/day: 0.25    Years: 6.00    Total pack years: 1.50    Types: Cigarettes    Quit date: 10/14/2007    Years since quitting: 14.9   Smokeless tobacco: Never  Vaping Use   Vaping Use: Never used  Substance and Sexual Activity   Alcohol use: No   Drug use: No   Sexual activity: Not on file  Other Topics Concern   Not on file  Social History Narrative   Not on file   Social Determinants of Health   Financial Resource Strain: Not on file  Food Insecurity: Not on file  Transportation Needs: Not on file  Physical Activity: Not on file  Stress: Not on file  Social Connections: Not on file   Past Surgical History:  Procedure Laterality Date   BREAST SURGERY     COLONOSCOPY      EYE SURGERY Bilateral    cataract surgery with lens implants   MANDIBLE RECONSTRUCTION     MINOR REMOVAL OF MANDIBULAR HARDWARE Right 10/15/2015   Procedure: MINOR REMOVAL OF Right MANDIBULAR HARDWARE;  Surgeon: Diona Browner, DDS;  Location: [facility];  Service: Oral Surgery;  Laterality: Right;   TONSILLECTOMY     Past Surgical History:  Procedure Laterality Date   BREAST SURGERY     COLONOSCOPY     EYE SURGERY Bilateral    cataract surgery with lens implants   MANDIBLE RECONSTRUCTION     MINOR REMOVAL OF MANDIBULAR HARDWARE Right 10/15/2015   Procedure: MINOR REMOVAL OF Right MANDIBULAR HARDWARE;  Surgeon: Diona Browner, DDS;  Location: [facility];  Service: Oral Surgery;  Laterality: Right;   TONSILLECTOMY     Past Medical History:  Diagnosis Date   Anemia    low iron   Anxiety    Arthritis    Bipolar disorder (HCC)    COPD (chronic obstructive pulmonary disease) (HCC)    Family history of adverse reaction to anesthesia    mom "was put to sleep and she never woke up" -    GERD (gastroesophageal reflux disease)    Guillain Barr syndrome (  HCC)    numbness in toes, legs hurt   Headache    migraines as a teenager   IBS (irritable bowel syndrome)    Lump, breast    removed years ago per Pt.   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 gets upset, has "quiet' seizures   Ht 5' 3.5" (1.613 m)   Wt 172 lb (78 kg)   BMI 29.99 kg/m   Opioid Risk Score:   Fall Risk Score:  `1  Depression screen PHQ 2/9     10/05/2022    1:58 PM 08/10/2022    1:30 PM 05/09/2022    1:36 PM 04/04/2022    1:21 PM 02/20/2022   12:58 PM 05/24/2021    2:10 PM  Depression screen PHQ 2/9  Decreased Interest 0 1 0 0 0 0  Down, Depressed, Hopeless 0 1 0 0 0 1  PHQ - 2 Score 0 2 0 0 0 1  Altered sleeping     1   Tired, decreased energy     0   Change in appetite     0   Feeling bad or failure about yourself      0   Trouble concentrating     1   Moving slowly or  fidgety/restless     3   Suicidal thoughts     0   PHQ-9 Score     5   Difficult doing work/chores     Not difficult at all     Review of Systems  Musculoskeletal:  Positive for back pain and gait problem.       Pain in elbows, feet, knees       Objective:   Physical Exam Vitals and nursing note reviewed.  Constitutional:      Appearance: Normal appearance.  Cardiovascular:     Rate and Rhythm: Normal rate and regular rhythm.     Pulses: Normal pulses.     Heart sounds: Normal heart sounds.  Pulmonary:     Effort: Pulmonary effort is normal.     Breath sounds: Normal breath sounds.     Comments: Continuous Oxygen @ 3 liters nasal cannula Musculoskeletal:     Cervical back: Normal range of motion and neck supple.     Comments: Normal Muscle Bulk and Muscle Testing Reveals:  Upper Extremities: Full ROM and Muscle Strength 5/5 Bilateral AC Joint Tenderness Thoracic Paraspinal Tenderness: T-1-T-3 Lumbar Paraspinal Tenderness: L-4-L-5 Lower Extremities: Full ROM and Muscle Strength 5/5 Arises from Table slowly using walker for support Narrow Based  Gait     Skin:    General: Skin is warm and dry.  Neurological:     Mental Status: She is alert and oriented to person, place, and time.  Psychiatric:        Mood and Affect: Mood normal.        Behavior: Behavior normal.         Assessment & Plan:  Guillain Barre Syndrome: Continue current medication regimen. Continue Outpatient Therapy. Continue current medication regimen. Continue to Monitor. 10/05/2022 Polyarthralgia: Continue current medication regimen . Continue to Monitor. 10/05/2022 Chronic Pain of Both Knees: Continue Outpatient Therapy. Continue current medication regimen. Continue to Monitor. 10/05/2022 Chronic Pain Syndrome: Refilled" Oxycodone 5 mg three times a day as needed for pain. #90. We will continue the opioid monitoring program, this consists of regular clinic visits, examinations, urine drug screen,  pill counts  as well as use of [place] Controlled Substance Reporting system. A 12 month History has been reviewed on the [place] Controlled Substance Reporting System on 10/05/2022   F/U in 2 month

## 2022-10-09 LAB — TOXASSURE SELECT,+ANTIDEPR,UR

## 2022-10-18 ENCOUNTER — Telehealth: Payer: Self-pay | Admitting: *Deleted

## 2022-10-18 NOTE — Telephone Encounter (Signed)
Urine drug screen for this encounter is consistent for prescribed medication 

## 2022-10-30 ENCOUNTER — Ambulatory Visit (INDEPENDENT_AMBULATORY_CARE_PROVIDER_SITE_OTHER): Payer: 59 | Admitting: Medical

## 2022-10-30 ENCOUNTER — Encounter: Payer: Self-pay | Admitting: Medical

## 2022-10-30 ENCOUNTER — Ambulatory Visit (HOSPITAL_BASED_OUTPATIENT_CLINIC_OR_DEPARTMENT_OTHER)
Admission: RE | Admit: 2022-10-30 | Discharge: 2022-10-30 | Disposition: A | Discharge status: Home / Self Care | Payer: 59 | Source: Ambulatory Visit | Attending: Medical | Admitting: Medical

## 2022-10-30 VITALS — BP 139/79 | HR 69 | Ht 63.5 in | Wt 172.0 lb

## 2022-10-30 DIAGNOSIS — E0590 Thyrotoxicosis, unspecified without thyrotoxic crisis or storm: Secondary | ICD-10-CM | POA: Diagnosis not present

## 2022-10-30 DIAGNOSIS — J449 Chronic obstructive pulmonary disease, unspecified: Secondary | ICD-10-CM

## 2022-10-30 DIAGNOSIS — M25521 Pain in right elbow: Secondary | ICD-10-CM

## 2022-10-30 DIAGNOSIS — M25522 Pain in left elbow: Secondary | ICD-10-CM

## 2022-10-30 DIAGNOSIS — G8929 Other chronic pain: Secondary | ICD-10-CM | POA: Diagnosis not present

## 2022-10-30 DIAGNOSIS — K219 Gastro-esophageal reflux disease without esophagitis: Secondary | ICD-10-CM

## 2022-10-30 DIAGNOSIS — J438 Other emphysema: Secondary | ICD-10-CM

## 2022-10-30 DIAGNOSIS — G610 Guillain-Barre syndrome: Secondary | ICD-10-CM | POA: Diagnosis not present

## 2022-10-30 MED ORDER — HYDROXYZINE HCL 10 MG PO TABS: ORAL_TABLET | ORAL | 0 refills | Status: DC

## 2022-10-30 NOTE — Patient Instructions (Addendum)
For copd continue oxygen and trelegy inhaler. At your request did place referral to [facility] pulmonologist your request. Keep current pulmonlogist appointment.  For gbs, polarthralgia, chronic pain and chronic pain syndrome continue with physical medicine.  Hyperthyroid continue methimazole and follow up with Dr. Kelton Pillar.  Jerrye Bushy- continue protonix.  For neuropathy continue with gabapentin.  For anxiety can continue with hydroxyzine. Will refill today.  For both elbow/lateral epicondyle area pain will get xray.  Follow up in one month or sooner if needed.

## 2022-10-30 NOTE — Progress Notes (Signed)
Subjective:    Patient ID: TAURA LAMARRE, female    DOB: 12/29/1950, 71 y.o.   MRN: [id number]  HPI First time here. Hx of gbs, polarthralgia, chronic pain and chronic pain syndrome.  Last physical medicaine MD visit.  Guillain Barre Syndrome: Continue current medication regimen. Continue Outpatient Therapy. Continue current medication regimen. Continue to Monitor. 10/05/2022 Polyarthralgia: Continue current medication regimen . Continue to Monitor. 10/05/2022 Chronic Pain of Both Knees: Continue Outpatient Therapy. Continue current medication regimen. Continue to Monitor. 10/05/2022 Chronic Pain Syndrome: Refilled" Oxycodone 5 mg three times a day as needed for pain. #90. We will continue the opioid monitoring program, this consists of regular clinic visits, examinations, urine drug screen, pill counts as well as use of [place] Controlled Substance Reporting system. A 12 month History has been reviewed on the [place] on 10/05/2022   Copd- pt is on trelegy.  Neuropathy- pt is on gabapentin 400 2-3 times daily.  Gerd- on protonix.  Hyperthyroid- pt is on tapazole.  Anxiety- pt is on hydroxyzine. Also takes for itching.  Bilateral epicondyle/elbow pain over past 2-3 months.  Review of Systems  Constitutional:  Negative for chills, fatigue and fever.  Respiratory:  Negative for cough, chest tightness, shortness of breath and wheezing.   Cardiovascular:  Negative for chest pain and leg swelling.  Genitourinary:  Negative for dysuria, flank pain, frequency and hematuria.  Neurological:  Negative for dizziness, syncope, weakness and numbness.  Hematological:  Negative for adenopathy. Does not bruise/bleed easily.  Psychiatric/Behavioral:  The patient is nervous/anxious.     Past Medical History:  Diagnosis Date   Anemia    low iron   Anxiety    Arthritis    Bipolar disorder (HCC)    COPD (chronic obstructive pulmonary  disease) (HCC)    Family history of adverse reaction to anesthesia    mom "was put to sleep and she never woke up" -    GERD (gastroesophageal reflux disease)    Guillain Barr syndrome (HCC)    numbness in toes, legs hurt   Headache    migraines as a teenager   IBS (irritable bowel syndrome)    Lump, breast    removed years ago per Pt.   Pneumonia    Restless leg syndrome    Seizures ([place])    only has one when she gets upset, has "quiet' seizures     Social History   Socioeconomic History   Marital status: Widowed    Spouse name: Not on file   Number of children: Not on file   Years of education: Not on file   Highest education level: Not on file  Occupational History   Not on file  Tobacco Use   Smoking status: Former    Packs/day: 0.25    Years: 6.00    Total pack years: 1.50    Types: Cigarettes    Quit date: 10/14/2007    Years since quitting: 15.0   Smokeless tobacco: Never  Vaping Use   Vaping Use: Never used  Substance and Sexual Activity   Alcohol use: No   Drug use: No   Sexual activity: Not on file  Other Topics Concern   Not on file  Social History Narrative   Not on file   Social Determinants of Health   Financial Resource Strain: Not on file  Food Insecurity: Not on file  Transportation Needs: Not on file  Physical Activity: Not on file  Stress:  Not on file  Social Connections: Not on file  Intimate Partner Violence: Not on file    Past Surgical History:  Procedure Laterality Date   BREAST SURGERY     COLONOSCOPY     EYE SURGERY Bilateral    cataract surgery with lens implants   MANDIBLE RECONSTRUCTION     MINOR REMOVAL OF MANDIBULAR HARDWARE Right 10/15/2015   Procedure: MINOR REMOVAL OF Right MANDIBULAR HARDWARE;  Surgeon: Diona Browner, DDS;  Location: [facility];  Service: Oral Surgery;  Laterality: Right;   TONSILLECTOMY      Family History  Problem Relation Age of Onset   Cancer Father     Allergies  Allergen Reactions    Aspirin    Influenza Vaccines     Current Outpatient Medications on File Prior to Visit  Medication Sig Dispense Refill   albuterol (PROVENTIL HFA;VENTOLIN HFA) 108 (90 BASE) MCG/ACT inhaler Inhale 2 puffs into the lungs every 6 (six) hours as needed for wheezing.     albuterol (PROVENTIL) (2.5 MG/3ML) 0.083% nebulizer solution Take 2.5 mg by nebulization every 6 (six) hours as needed for wheezing.     alendronate (FOSAMAX) 70 MG tablet Take 70 mg by mouth every Sunday. Take with a full glass of water on an empty stomach.     ARIPiprazole (ABILIFY) 15 MG tablet Take 15 mg by mouth daily.     B Complex Vitamins (VITAMIN B COMPLEX) TABS Take 1 tablet by mouth daily. 30 tablet 5   benzonatate (TESSALON) 100 MG capsule Take 1 capsule (100 mg total) by mouth 3 (three) times daily as needed for cough. 90 capsule 1   benzonatate (TESSALON) 200 MG capsule Take 200 mg by mouth 3 (three) times daily as needed.     benztropine (COGENTIN) 0.5 MG tablet Take 0.5 mg by mouth 2 (two) times daily.     Cholecalciferol (VITAMIN D3) 125 MCG (5000 UT) CAPS Take 1 capsule (5,000 Units total) by mouth daily. 30 capsule 11   cromolyn (OPTICROM) 4 % ophthalmic solution Place 1 drop into both eyes 4 times daily.     doxycycline (ADOXA) 100 MG tablet Take 100 mg by mouth 2 (two) times daily.     famciclovir (FAMVIR) 250 MG tablet Take 250 mg by mouth 2 (two) times daily.     Fluticasone-Umeclidin-Vilant (TRELEGY ELLIPTA) 100-62.5-25 MCG/INH AEPB Inhale into the lungs.     gabapentin (NEURONTIN) 400 MG capsule Take 1,200 mg by mouth 3 (three) times daily.     ibuprofen (ADVIL) 800 MG tablet Take 800 mg by mouth 3 (three) times daily.     ipratropium-albuterol (DUONEB) 0.5-2.5 (3) MG/3ML SOLN Take 3 mLs by nebulization every 4 (four) hours as needed. 360 mL 5   loperamide (IMODIUM) 2 MG capsule Take 2 mg by mouth daily.     magnesium oxide (MAG-OX) 400 MG tablet Take 250 mg by mouth daily.     meloxicam (MOBIC) 7.5 MG  tablet Take 7.5 mg by mouth daily.     methimazole (TAPAZOLE) 5 MG tablet Take 1 tablet (5 mg total) by mouth daily. 90 tablet 3   montelukast (SINGULAIR) 10 MG tablet Take 10 mg by mouth at bedtime.     oxcarbazepine (TRILEPTAL) 600 MG tablet Take 600 mg by mouth 2 (two) times daily.     oxyCODONE (ROXICODONE) 5 MG immediate release tablet Take 1 tablet (5 mg total) by mouth 3 (three) times daily as needed for severe pain. 90 tablet 0   pantoprazole (  PROTONIX) 40 MG tablet Take 40 mg by mouth daily.     potassium chloride SA (KLOR-CON) 20 MEQ tablet Take 1 tablet (20 mEq total) by mouth 2 (two) times daily for 7 days. 14 tablet 0   ranitidine (ZANTAC) 300 MG capsule Take 300 mg by mouth every evening.     traZODone (DESYREL) 50 MG tablet Take 50 mg by mouth at bedtime.     Vitamin D, Ergocalciferol, (DRISDOL) 1.25 MG (50000 UNIT) CAPS capsule Take 50,000 Units by mouth once a week.     No current facility-administered medications on file prior to visit.    BP 139/79   Pulse 69   Ht 5' 3.5" (1.613 m)   Wt 172 lb (78 kg)   SpO2 (!) 89%   BMI 29.99 kg/m         Objective:   Physical Exam  General Mental Status- Alert. General Appearance- Not in acute distress.   Skin General: Color- Normal Color. Moisture- Normal Moisture.  Neck Carotid Arteries- Normal color. Moisture- Normal Moisture. No carotid bruits. No JVD.  Chest and Lung Exam Auscultation: Breath Sounds:-Normal.  Cardiovascular Auscultation:Rythm- Regular. Murmurs & Other Heart Sounds:Auscultation of the heart reveals- No Murmurs.  Abdomen Inspection:-Inspeection Normal. Palpation/Percussion:Note:No mass. Palpation and Percussion of the abdomen reveal- Non Tender, Non Distended + BS, no rebound or guarding.   Neurologic Cranial Nerve exam:- CN III-XII intact(No nystagmus), symmetric smile. Strength:- 5/5 equal and symmetric strength both upper and lower extremities.    Lower ext- no pedal edema. Negative  homans signs.     Assessment & Plan:   Patient Instructions  For copd continue oxygen and tr elegy inhaler. At your request did place referral to [facility] pulmonologist your request. Keep current pulmonlogist appointment.  For gbs, polarthralgia, chronic pain and chronic pain syndrome continue with physical medicine.  Hyperthyroid continue methimazole and follow up with Dr. Kelton Pillar.  Jerrye Bushy- continue protonix.  For neuropathy continue with gabapentin.  For anxiety can continue with hydroxyzine. Will refill today.  For both elbow/lateral epicondyle area pain will get xray.  Follow up in one month or sooner if needed.       Mackie Pai, PA-C

## 2022-11-07 ENCOUNTER — Ambulatory Visit (INDEPENDENT_AMBULATORY_CARE_PROVIDER_SITE_OTHER): Payer: 59 | Admitting: Pulmonary Disease

## 2022-11-07 ENCOUNTER — Encounter: Payer: Self-pay | Admitting: Pulmonary Disease

## 2022-11-07 VITALS — BP 138/70 | HR 80 | Temp 98.0°F | Ht 63.5 in | Wt 175.0 lb

## 2022-11-07 DIAGNOSIS — J439 Emphysema, unspecified: Secondary | ICD-10-CM

## 2022-11-07 DIAGNOSIS — J4489 Other specified chronic obstructive pulmonary disease: Secondary | ICD-10-CM

## 2022-11-07 DIAGNOSIS — R911 Solitary pulmonary nodule: Secondary | ICD-10-CM | POA: Diagnosis not present

## 2022-11-07 MED ORDER — AZITHROMYCIN 250 MG PO TABS: ORAL_TABLET | ORAL | 0 refills | Status: DC

## 2022-11-07 MED ORDER — PREDNISONE 20 MG PO TABS: ORAL_TABLET | ORAL | 0 refills | Status: DC

## 2022-11-07 NOTE — Patient Instructions (Signed)
Prescribe Z-Pak and prednisone 40 mg a day for 5 days Continue Trelegy Follow-up in 3 months with video visit.

## 2022-11-07 NOTE — Progress Notes (Signed)
Jill Spence    [id number]    20-Aug-1951  Primary Care Physician:Saguier, Iris Pert  Referring Physician: Shanon Ace, MD [address]  [address] [place],  [place] [address]  Chief complaint: Follow-up for COPD  HPI: 71 y.o. with history of COPD on supplemental oxygen, lung nodules.   Maintained on Breo and now Trelegy inhaler.  She has had recurrent exacerbations over the past year requiring prednisone, antibiotics. She is also being followed with serial CT scans for lung nodule  States that she was hospitalized for COVID-19 at [facility] in early 2021 but I do not have any record of those. Diagnosed with multinodular goiter and hypothyroidism in 2022.  She is following with endocrinology and is on methimazole  2022 She had a CT chest and PET scan for evaluation of lung nodules PET scan showed hypermetabolic them in the right axilla.  She has been seen at dermatology and this lesion excised.  I have reviewed the previous notes from dermatology As per the patient and the final biopsy report from September 15 is benign keloid  She also had a follow-up CT chest which showed improvement in lung nodule  Pets: Has a dog Occupation: Retired Recruitment consultant Exposures: Reports mold in the previous apartment.  No current mold exposure.  No hot tub, Jacuzzi Smoking history: States that she quit smoking in 2008.  Cannot give me a clear idea of how much she smoked prior Travel history: No significant travel history Relevant family history: No significant family history of lung disease  Interim history: Has chronic dyspnea on exertion with cough and mucus production. Chief complaint today is increasing mucus production which is yellow in color and feeling of choking in the throat  Outpatient Encounter Medications as of 71/04/2022  Medication Sig   albuterol (PROVENTIL HFA;VENTOLIN HFA) 108 (90 BASE) MCG/ACT inhaler Inhale 2 puffs into the lungs every 6  (six) hours as needed for wheezing.   albuterol (PROVENTIL) (2.5 MG/3ML) 0.083% nebulizer solution Take 2.5 mg by nebulization every 6 (six) hours as needed for wheezing.   alendronate (FOSAMAX) 70 MG tablet Take 70 mg by mouth every Sunday. Take with a full glass of water on an empty stomach.   ARIPiprazole (ABILIFY) 15 MG tablet Take 15 mg by mouth daily.   B Complex Vitamins (VITAMIN B COMPLEX) TABS Take 1 tablet by mouth daily.   benzonatate (TESSALON) 200 MG capsule Take 200 mg by mouth 3 (three) times daily as needed.   benztropine (COGENTIN) 0.5 MG tablet Take 0.5 mg by mouth 2 (two) times daily.   Cholecalciferol (VITAMIN D3) 125 MCG (5000 UT) CAPS Take 1 capsule (5,000 Units total) by mouth daily.   cromolyn (OPTICROM) 4 % ophthalmic solution Place 1 drop into both eyes 4 times daily.   doxycycline (ADOXA) 100 MG tablet Take 100 mg by mouth 2 (two) times daily.   famciclovir (FAMVIR) 250 MG tablet Take 250 mg by mouth 2 (two) times daily.   Fluticasone-Umeclidin-Vilant (TRELEGY ELLIPTA) 100-62.5-25 MCG/INH AEPB Inhale into the lungs.   gabapentin (NEURONTIN) 400 MG capsule Take 1,200 mg by mouth 3 (three) times daily.   hydrOXYzine (ATARAX) 10 MG tablet 1-2 tab q hs prn anxiety or insomnia   ibuprofen (ADVIL) 800 MG tablet Take 800 mg by mouth 3 (three) times daily.   ipratropium-albuterol (DUONEB) 0.5-2.5 (3) MG/3ML SOLN Take 3 mLs by nebulization every 4 (four) hours as needed.   loperamide (  IMODIUM) 2 MG capsule Take 2 mg by mouth daily.   magnesium oxide (MAG-OX) 400 MG tablet Take 250 mg by mouth daily.   meloxicam (MOBIC) 7.5 MG tablet Take 7.5 mg by mouth daily.   methimazole (TAPAZOLE) 5 MG tablet Take 1 tablet (5 mg total) by mouth daily.   montelukast (SINGULAIR) 10 MG tablet Take 10 mg by mouth at bedtime.   oxcarbazepine (TRILEPTAL) 600 MG tablet Take 600 mg by mouth 2 (two) times daily.   oxyCODONE (ROXICODONE) 5 MG immediate release tablet Take 1 tablet (5 mg total) by  mouth 3 (three) times daily as needed for severe pain.   pantoprazole (PROTONIX) 40 MG tablet Take 40 mg by mouth daily.   ranitidine (ZANTAC) 300 MG capsule Take 300 mg by mouth every evening.   traZODone (DESYREL) 50 MG tablet Take 50 mg by mouth at bedtime.   Vitamin D, Ergocalciferol, (DRISDOL) 1.25 MG (50000 UNIT) CAPS capsule Take 50,000 Units by mouth once a week.   [DISCONTINUED] benzonatate (TESSALON) 100 MG capsule Take 1 capsule (100 mg total) by mouth 3 (three) times daily as needed for cough.   potassium chloride SA (KLOR-CON) 20 MEQ tablet Take 1 tablet (20 mEq total) by mouth 2 (two) times daily for 7 days.   No facility-administered encounter medications on file as of 11/07/2022.   Physical Exam: Blood pressure 138/70, pulse 80, temperature 98 F (36.7 C), temperature source Oral, height 5' 3.5" (1.613 m), weight 175 lb (79.4 kg), SpO2 92 %. Gen:      No acute distress HEENT:  EOMI, sclera anicteric Neck:     No masses; no thyromegaly Lungs:    Clear to auscultation bilaterally; normal respiratory effort CV:         Regular rate and rhythm; no murmurs Abd:      + bowel sounds; soft, non-tender; no palpable masses, no distension Ext:    No edema; adequate peripheral perfusion Skin:      Warm and dry; no rash Neuro: alert and oriented x 3 Psych: normal mood and affect   Data Reviewed: Imaging: CT chest 03/29/2020-cirrhosis, thyroid nodule, severe emphysema  A new 4 mm nodule is seen along the minor fissure (3/61). 6 mm nodule in the lateral  segment right middle lobe (3/69) has enlarged from 3 mm on  01/01/2019. 2 mm anterior right lower lobe nodule (3/76) is new.  Scarring in the apex of the left upper lobe with a new nodular  component measuring 6 mm (3/12). 3 mm lingular nodule (3/63) is new.  Additional tiny pulmonary nodules in the left lung are stable. No  pleural fluid. Airway is unremarkable.   CT chest 07/14/2020-new 5 mm nodule in the right middle lobe.   Remainder of the lung nodules are stable.  Enlarged pulmonary trunk  CT chest 10/20/2020-stable right middle lobe nodule, thyroid nodule, aortic atherosclerosis, emphysema  CT chest 04/14/2021-interval development of pulmonary nodules  PET scan 04/29/2021-12 mm lesion along the left anterior shoulder/axilla.  Multiple bilateral nodules up to 10 mm.  Super D CT 08/02/2021-recently noted pulm nodules are stable to decreased in size.  CT chest 04/10/2022-stable pulm nodules, severe emphysema, coronary artery disease I have reviewed the images personally.  PFTs: 01/26/2021 FVC 1.68 [73%], FEV1 0.72 [40%], TLC 6.19 [126%], DLCO 5.09 [26%] Severe obstruction with air trapping and hyperinflation, severe diffusion defect  Labs: CBC 07/27/2020-WBC 7.3, eos 1%, absolute eosinophil count 73 IgE 07/27/2020-5  Alpha-1 antitrypsin 08/28/2028-170, PIMM  Assessment:  Severe  COPD with exacerbations Currently on Trelegy inhaler. Duo nebs, nebulizer Prescribed Z-Pak and prednisone 40 mg a day for 5 days  Mucinex, flutter valve for mucociliary clearance She is continuing with physical therapy.  Declined pulmonary rehab.   Pulmonary nodules Stable on CT scan in May 2023. Follow up is ordered for 2024  Abnormal PET scan PET scan in 2022 showed hypermetabolic lesion along the left shoulder/axilla.  She had a skin lesion which was excised by dermatology and is reportedly a benign keloid.   Plan/Recommendations: Continue Trelegy Z-Pak, prednisone  Return to clinic in 3 months  Marshell Garfinkel MD [facility] 11/07/2022, 2:00 PM  CC: Shanon Ace,*

## 2022-11-08 ENCOUNTER — Other Ambulatory Visit: Payer: Self-pay | Admitting: Medical

## 2022-11-09 ENCOUNTER — Telehealth: Payer: Self-pay | Admitting: Pulmonary Disease

## 2022-11-09 MED ORDER — TRELEGY ELLIPTA 100-62.5-25 MCG/ACT IN AEPB: 1.0000 | INHALATION_SPRAY | Freq: Every day | RESPIRATORY_TRACT | 3 refills | Status: DC

## 2022-11-09 NOTE — Telephone Encounter (Signed)
Spoke with the pt's caregiver with her permission  She states that pt was requesting 90 days supply of trelegy  Rx sent to [facility] per her request  She states that she also wanted a 90 day course of pred  I advised Dr Vaughan Browner only ordered 40 mg x 5 days, as this is not a daily med for her  She is aware to call back if gets worse once she finishes 5 day course

## 2022-11-09 NOTE — Telephone Encounter (Signed)
Craig Guess is here on behalf of PT. States Pt was here yesterday and we got her medication wrong. Can someone come out to see her? TY.

## 2022-11-16 ENCOUNTER — Other Ambulatory Visit: Payer: Self-pay | Admitting: Medical

## 2022-11-21 ENCOUNTER — Encounter: Payer: Self-pay | Admitting: Medical

## 2022-11-21 ENCOUNTER — Telehealth: Payer: Self-pay

## 2022-11-21 ENCOUNTER — Ambulatory Visit (INDEPENDENT_AMBULATORY_CARE_PROVIDER_SITE_OTHER): Payer: 59 | Admitting: Medical

## 2022-11-21 VITALS — BP 140/80 | HR 72 | Resp 18 | Ht 63.5 in | Wt 172.0 lb

## 2022-11-21 DIAGNOSIS — G610 Guillain-Barre syndrome: Secondary | ICD-10-CM | POA: Diagnosis not present

## 2022-11-21 DIAGNOSIS — J449 Chronic obstructive pulmonary disease, unspecified: Secondary | ICD-10-CM | POA: Diagnosis not present

## 2022-11-21 DIAGNOSIS — K219 Gastro-esophageal reflux disease without esophagitis: Secondary | ICD-10-CM

## 2022-11-21 DIAGNOSIS — K580 Irritable bowel syndrome with diarrhea: Secondary | ICD-10-CM

## 2022-11-21 DIAGNOSIS — R030 Elevated blood-pressure reading, without diagnosis of hypertension: Secondary | ICD-10-CM

## 2022-11-21 DIAGNOSIS — Z8659 Personal history of other mental and behavioral disorders: Secondary | ICD-10-CM

## 2022-11-21 DIAGNOSIS — R197 Diarrhea, unspecified: Secondary | ICD-10-CM | POA: Diagnosis not present

## 2022-11-21 DIAGNOSIS — G629 Polyneuropathy, unspecified: Secondary | ICD-10-CM

## 2022-11-21 MED ORDER — MONTELUKAST SODIUM 10 MG PO TABS: 10.0000 mg | ORAL_TABLET | Freq: Every day | ORAL | 3 refills | Status: DC

## 2022-11-21 MED ORDER — OXYCODONE HCL 5 MG PO TABS
5.0000 mg | ORAL_TABLET | Freq: Three times a day (TID) | ORAL | 0 refills | Status: DC | PRN
Start: 2022-11-21 — End: 2022-12-07

## 2022-11-21 NOTE — Addendum Note (Signed)
Addended by: Manuela Schwartz on: 11/21/2022 03:13 PM   Modules accepted: Orders

## 2022-11-21 NOTE — Telephone Encounter (Signed)
PMP was Reviewed.  Oxycodone e-scribed to pharmacy.

## 2022-11-21 NOTE — Patient Instructions (Addendum)
1. Stage 3 severe COPD by GOLD classification ([place]) Check cmp today.     2. Diarrhea, unspecified type Hydrate well. Bland diet. Use ibs meds presently.  - Stool Culture; Future - Ova and parasite examination - Clostridium Difficile by PCR - CBC w/Diff - Comp Met (CMET)  3 Guillain Barr syndrome ([place])- polarthralgia, chronic pain and chronic pain syndrome continue with physical medicine.   4. Gastroesophageal reflux disease without esophagitis- continue protonix.  5. Neuropathy Continue gabapentin  6. Irritable bowel syndrome with diarrhea Refer to gi MD. Continue loperamide and bentyl.  - Ambulatory referral to Gastroenterology  7. Elevated blood pressure reading  Want you to check bp at home daily over next week and update me on readings by my chart.  8. For allergic rhinitis refilled your montelukast.  9. Asked home health nurse to send me update one week before needs rx refill. Will send 90 tab supply on most meds that uses chronically.  10. Follow up in 1 month or sooner if needed.  11. For chapped lips recommend getting chap stick with vitamin E.

## 2022-11-21 NOTE — Progress Notes (Signed)
Subjective:    Patient ID: Jill Spence, female    DOB: 1951/06/02, 71 y.o.   MRN: [id number]  HPI  Pt in for follow up.  [facility] She expresses [facility] no [facility].   Pt shows brings in all of her meds. Pt is worried about running out of meds although really has plenty of tablets. Nothing is going to run out immediatley.   At pt request I had placed referral to pulmonologist at [facility]. She will be seeing them every other month. Pt is on trelegy inhaler and oxygen. Pt gets 20 mg tabs of prednisone a month. She takes twice a day if necessary.    Pt has chapped lips since for a month.   Gbs, polarthralgia, chronic pain and chronic pain syndrome continue with physical medicine.   Hyperthyroid on methimazole and see Dr. Kelton Pillar.   Gerd- on protonix.   For neuropathy on gabapentin.   For anxiety can on hydroxyzine.  Pt has seasonal allergies- on montelukast. She is worried about running and has half bottle. Will refill today.  Pt has hx of ibs-d. Pt is on loperamide and bentyl. Pt states she thinks bentyl maybe causing spasma. But also note recent increase lose stools and watery stools. This loose than her baseline of ibs-D.   Pt has home health 7 days a weeks. So they will help out with patients meds and keep updated on when needs refills.    Review of Systems  Constitutional:  Negative for chills, fatigue and fever.  HENT:  Negative for congestion, drooling and ear discharge.   Respiratory:  Negative for cough, chest tightness, shortness of breath and wheezing.   Cardiovascular:  Negative for chest pain and palpitations.  Gastrointestinal:  Positive for diarrhea. Negative for abdominal pain, nausea, rectal pain and vomiting.  Genitourinary:  Negative for dyspareunia and dysuria.  Musculoskeletal:  Negative for back pain and joint swelling.  Skin:  Negative for pallor and rash.  Neurological:  Negative  for dizziness, tremors, speech difficulty, weakness, light-headedness and headaches.  Hematological:  Negative for adenopathy. Does not bruise/bleed easily.  Psychiatric/Behavioral:  Negative for behavioral problems and confusion.    Past Medical History:  Diagnosis Date   Anemia    low iron   Anxiety    Arthritis    Bipolar disorder (HCC)    COPD (chronic obstructive pulmonary disease) (HCC)    Family history of adverse reaction to anesthesia    mom "was put to sleep and she never woke up" -    GERD (gastroesophageal reflux disease)    Guillain Barr syndrome (HCC)    numbness in toes, legs hurt   Headache    migraines as a teenager   IBS (irritable bowel syndrome)    Lump, breast    removed years ago per Pt.   Pneumonia    Restless leg syndrome    Seizures ([place])    only has one when she gets upset, has "quiet' seizures     Social History   Socioeconomic History   Marital status: Widowed    Spouse name: Not on file   Number of children: Not on file   Years of education: Not on file   Highest education level: Not on file  Occupational History   Not on file  Tobacco Use   Smoking status: Former    Packs/day: 0.25    Years: 6.00    Total pack  years: 1.50    Types: Cigarettes    Quit date: 10/14/2007    Years since quitting: 15.1   Smokeless tobacco: Never  Vaping Use   Vaping Use: Never used  Substance and Sexual Activity   Alcohol use: No   Drug use: No   Sexual activity: Not on file  Other Topics Concern   Not on file  Social History Narrative   Not on file   Social Determinants of Health   Financial Resource Strain: Not on file  Food Insecurity: Not on file  Transportation Needs: Not on file  Physical Activity: Not on file  Stress: Not on file  Social Connections: Not on file  Intimate Partner Violence: Not on file    Past Surgical History:  Procedure Laterality Date   BREAST SURGERY     COLONOSCOPY     EYE SURGERY Bilateral    cataract  surgery with lens implants   MANDIBLE RECONSTRUCTION     MINOR REMOVAL OF MANDIBULAR HARDWARE Right 10/15/2015   Procedure: MINOR REMOVAL OF Right MANDIBULAR HARDWARE;  Surgeon: Diona Browner, DDS;  Location: [facility];  Service: Oral Surgery;  Laterality: Right;   TONSILLECTOMY      Family History  Problem Relation Age of Onset   Cancer Father     Allergies  Allergen Reactions   Aspirin    Influenza Vaccines     Current Outpatient Medications on File Prior to Visit  Medication Sig Dispense Refill   albuterol (PROVENTIL HFA;VENTOLIN HFA) 108 (90 BASE) MCG/ACT inhaler Inhale 2 puffs into the lungs every 6 (six) hours as needed for wheezing.     albuterol (PROVENTIL) (2.5 MG/3ML) 0.083% nebulizer solution Take 2.5 mg by nebulization every 6 (six) hours as needed for wheezing.     alendronate (FOSAMAX) 70 MG tablet Take 70 mg by mouth every Sunday. Take with a full glass of water on an empty stomach.     ARIPiprazole (ABILIFY) 15 MG tablet Take 15 mg by mouth daily.     azithromycin (ZITHROMAX) 250 MG tablet Take as directed 6 tablet 0   B Complex Vitamins (VITAMIN B COMPLEX) TABS Take 1 tablet by mouth daily. 30 tablet 5   benzonatate (TESSALON) 200 MG capsule Take 200 mg by mouth 3 (three) times daily as needed.     benztropine (COGENTIN) 0.5 MG tablet Take 0.5 mg by mouth 2 (two) times daily.     Cholecalciferol (VITAMIN D3) 125 MCG (5000 UT) CAPS Take 1 capsule (5,000 Units total) by mouth daily. 30 capsule 11   cromolyn (OPTICROM) 4 % ophthalmic solution Place 1 drop into both eyes 4 times daily.     doxycycline (ADOXA) 100 MG tablet Take 100 mg by mouth 2 (two) times daily.     famciclovir (FAMVIR) 250 MG tablet Take 250 mg by mouth 2 (two) times daily.     Fluticasone-Umeclidin-Vilant (TRELEGY ELLIPTA) 100-62.5-25 MCG/ACT AEPB Inhale 1 puff into the lungs daily. 180 each 3   Fluticasone-Umeclidin-Vilant (TRELEGY ELLIPTA) 100-62.5-25 MCG/INH AEPB Inhale into the lungs.     gabapentin  (NEURONTIN) 400 MG capsule Take 1,200 mg by mouth 3 (three) times daily.     hydrOXYzine (ATARAX) 10 MG tablet 1-2 tab q hs prn anxiety or insomnia 30 tablet 0   ibuprofen (ADVIL) 800 MG tablet Take 800 mg by mouth 3 (three) times daily.     ipratropium-albuterol (DUONEB) 0.5-2.5 (3) MG/3ML SOLN Take 3 mLs by nebulization every 4 (four) hours as needed. 360 mL 5  loperamide (IMODIUM) 2 MG capsule Take 2 mg by mouth daily.     magnesium oxide (MAG-OX) 400 MG tablet Take 250 mg by mouth daily.     meloxicam (MOBIC) 7.5 MG tablet Take 7.5 mg by mouth daily.     methimazole (TAPAZOLE) 5 MG tablet Take 1 tablet (5 mg total) by mouth daily. 90 tablet 3   oxcarbazepine (TRILEPTAL) 600 MG tablet Take 600 mg by mouth 2 (two) times daily.     oxyCODONE (ROXICODONE) 5 MG immediate release tablet Take 1 tablet (5 mg total) by mouth 3 (three) times daily as needed for severe pain. 90 tablet 0   pantoprazole (PROTONIX) 40 MG tablet Take 40 mg by mouth daily.     predniSONE (DELTASONE) 20 MG tablet Take 56m for 5 days 10 tablet 0   ranitidine (ZANTAC) 300 MG capsule Take 300 mg by mouth every evening.     traZODone (DESYREL) 50 MG tablet Take 50 mg by mouth at bedtime.     Vitamin D, Ergocalciferol, (DRISDOL) 1.25 MG (50000 UNIT) CAPS capsule Take 50,000 Units by mouth once a week.     potassium chloride SA (KLOR-CON) 20 MEQ tablet Take 1 tablet (20 mEq total) by mouth 2 (two) times daily for 7 days. 14 tablet 0   No current facility-administered medications on file prior to visit.    BP (!) 140/80   Pulse 72   Resp 18   Ht 5' 3.5" (1.613 m)   Wt 172 lb (78 kg)   SpO2 92%   BMI 29.99 kg/m           Objective:   Physical Exam  General- No acute distress. Pleasant patient. Neck- Full range of motion, no jvd Lungs- Clear, even and unlabored. Heart- regular rate and rhythm. Neurologic- CNII- XII grossly intact.  Abdomen- soft, nt, nd, +bs. No rebound or guarding.     Assessment & Plan:    Patient Instructions  1. Stage 3 severe [place] Check cmp today.     2. Diarrhea, unspecified type Hydrate well. Bland diet. Use ibs meds presently.  - Stool Culture; Future - Ova and parasite examination - Clostridium Difficile by PCR - CBC w/Diff - Comp Met (CMET)  3 Guillain Barr syndrome ([place]- polarthralgia, chronic pain and chronic pain syndrome continue with physical medicine.   4. Gastroesophageal reflux disease without esophagitis- continue protonix.  5. Neuropathy Continue gabapentin  6. Irritable bowel syndrome with diarrhea Refer to gi MD. Continue loperamide and bentyl.  - Ambulatory referral to Gastroenterology  7. Elevated blood pressure reading  Want you to check bp at home daily over next week and update me on readings by my chart.  8. For allergic rhinitis refilled your montelukast.  9. Asked home health nurse to send me update one week before needs rx refill. Will send 90 tab supply on most meds that uses chronically.  10. Follow up in 1 month or sooner if needed.  11. For chapped lips recommend getting chap stick with vitamin E.   EMackie Pai PA-C   Time spent with patient today was  43 minutes which consisted of chart revdiew, discussing diagnosis, work up treatment and documentation.

## 2022-11-21 NOTE — Telephone Encounter (Signed)
Patient called for a refill of Oxycodone 5 MG. Patient has #15 on hand. Please send to the mail order pharmacy, [facility].

## 2022-11-22 LAB — CBC WITH DIFFERENTIAL/PLATELET
Basophils Absolute: 0.1 10*3/uL (ref 0.0–0.1)
Basophils Relative: 1.3 % (ref 0.0–3.0)
Eosinophils Absolute: 0.1 10*3/uL (ref 0.0–0.7)
Eosinophils Relative: 1.4 % (ref 0.0–5.0)
HCT: 43.5 % (ref 36.0–46.0)
Hemoglobin: 14.5 g/dL (ref 12.0–15.0)
Lymphocytes Relative: 42.4 % (ref 12.0–46.0)
Lymphs Abs: 2.9 10*3/uL (ref 0.7–4.0)
MCHC: 33.4 g/dL (ref 30.0–36.0)
MCV: 97.2 fl (ref 78.0–100.0)
Monocytes Absolute: 0.5 10*3/uL (ref 0.1–1.0)
Monocytes Relative: 7.9 % (ref 3.0–12.0)
Neutro Abs: 3.3 10*3/uL (ref 1.4–7.7)
Neutrophils Relative %: 47 % (ref 43.0–77.0)
Platelets: 279 10*3/uL (ref 150.0–400.0)
RBC: 4.48 Mil/uL (ref 3.87–5.11)
RDW: 14.4 % (ref 11.5–15.5)
WBC: 6.9 10*3/uL (ref 4.0–10.5)

## 2022-11-22 LAB — COMPREHENSIVE METABOLIC PANEL
ALT: 11 U/L (ref 0–35)
AST: 17 U/L (ref 0–37)
Albumin: 4.2 g/dL (ref 3.5–5.2)
Alkaline Phosphatase: 77 U/L (ref 39–117)
BUN: 15 mg/dL (ref 6–23)
CO2: 29 mEq/L (ref 19–32)
Calcium: 9.3 mg/dL (ref 8.4–10.5)
Chloride: 105 mEq/L (ref 96–112)
Creatinine, Ser: 0.7 mg/dL (ref 0.40–1.20)
GFR: 87.21 mL/min (ref >60.00)
Glucose, Bld: 86 mg/dL (ref 70–99)
Potassium: 3.8 mEq/L (ref 3.5–5.1)
Sodium: 142 mEq/L (ref 135–145)
Total Bilirubin: 0.4 mg/dL (ref 0.2–1.2)
Total Protein: 7 g/dL (ref 6.0–8.3)

## 2022-12-07 ENCOUNTER — Encounter: Payer: 59 | Attending: Physical Medicine and Rehabilitation | Admitting: Registered Nurse

## 2022-12-07 ENCOUNTER — Encounter: Payer: Self-pay | Admitting: Registered Nurse

## 2022-12-07 VITALS — BP 146/91 | HR 77 | Ht 63.5 in | Wt 176.0 lb

## 2022-12-07 DIAGNOSIS — M2550 Pain in unspecified joint: Secondary | ICD-10-CM

## 2022-12-07 DIAGNOSIS — G894 Chronic pain syndrome: Secondary | ICD-10-CM

## 2022-12-07 DIAGNOSIS — G8929 Other chronic pain: Secondary | ICD-10-CM | POA: Diagnosis present

## 2022-12-07 DIAGNOSIS — Z79891 Long term (current) use of opiate analgesic: Secondary | ICD-10-CM | POA: Diagnosis present

## 2022-12-07 DIAGNOSIS — M25561 Pain in right knee: Secondary | ICD-10-CM | POA: Diagnosis present

## 2022-12-07 DIAGNOSIS — Z5181 Encounter for therapeutic drug level monitoring: Secondary | ICD-10-CM | POA: Diagnosis present

## 2022-12-07 DIAGNOSIS — G610 Guillain-Barre syndrome: Secondary | ICD-10-CM

## 2022-12-07 MED ORDER — OXYCODONE HCL 5 MG PO TABS: 5.0000 mg | ORAL_TABLET | Freq: Three times a day (TID) | ORAL | 0 refills | Status: DC | PRN

## 2022-12-07 NOTE — Progress Notes (Signed)
Subjective:    Patient ID: Jill Spence, female    DOB: Jun 26, 1951, 72 y.o.   MRN: [id number]  HPI: Jill Spence is a 72 y.o. female who returns for follow up appointment for chronic pain and medication refill. She states her  pain is located in her right knee and generalized pain. She rates her pain 9. Her current exercise regime is walking with her walker..  [name] Morphine equivalent is 22.50 MME.   Last UDS was Performed on 10/05/2022, it was consistent.    Pain Inventory Average Pain 9 Pain Right Now 9 My pain is constant, aching, and throb  In the last 24 hours, has pain interfered with the following? General activity 0 Relation with others 0 Enjoyment of life 0 What TIME of day is your pain at its worst? night Sleep (in general) Poor  Pain is worse with: walking, sitting, inactivity, standing, and some activites Pain improves with: medication and moving around Relief from Meds: 10  Family History  Problem Relation Age of Onset   Cancer Father    Social History   Socioeconomic History   Marital status: Widowed    Spouse name: Not on file   Number of children: Not on file   Years of education: Not on file   Highest education level: Not on file  Occupational History   Not on file  Tobacco Use   Smoking status: Former    Packs/day: 0.25    Years: 6.00    Total pack years: 1.50    Types: Cigarettes    Quit date: 10/14/2007    Years since quitting: 15.1   Smokeless tobacco: Never  Vaping Use   Vaping Use: Never used  Substance and Sexual Activity   Alcohol use: No   Drug use: No   Sexual activity: Not on file  Other Topics Concern   Not on file  Social History Narrative   Not on file   Social Determinants of Health   Financial Resource Strain: Not on file  Food Insecurity: Not on file  Transportation Needs: Not on file  Physical Activity: Not on file  Stress: Not on file  Social Connections: Not on file   Past Surgical History:   Procedure Laterality Date   BREAST SURGERY     COLONOSCOPY     EYE SURGERY Bilateral    cataract surgery with lens implants   MANDIBLE RECONSTRUCTION     MINOR REMOVAL OF MANDIBULAR HARDWARE Right 10/15/2015   Procedure: MINOR REMOVAL OF Right MANDIBULAR HARDWARE;  Surgeon: Diona Browner, DDS;  Location: [place];  Service: Oral Surgery;  Laterality: Right;   TONSILLECTOMY     Past Surgical History:  Procedure Laterality Date   BREAST SURGERY     COLONOSCOPY     EYE SURGERY Bilateral    cataract surgery with lens implants   MANDIBLE RECONSTRUCTION     MINOR REMOVAL OF MANDIBULAR HARDWARE Right 10/15/2015   Procedure: MINOR REMOVAL OF Right MANDIBULAR HARDWARE;  Surgeon: Diona Browner, DDS;  Location: [place];  Service: Oral Surgery;  Laterality: Right;   TONSILLECTOMY     Past Medical History:  Diagnosis Date   Anemia    low iron   Anxiety    Arthritis    Bipolar disorder (HCC)    COPD (chronic obstructive pulmonary disease) (HCC)    Family history of adverse reaction to anesthesia    mom "was put to sleep and she never woke up" -  GERD (gastroesophageal reflux disease)    Guillain Barr syndrome (HCC)    numbness in toes, legs hurt   Headache    migraines as a teenager   IBS (irritable bowel syndrome)    Lump, breast    removed years ago per Pt.   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 gets upset, has "quiet' seizures   There were no vitals taken for this visit.  Opioid Risk Score:   Fall Risk Score:  `1  Depression screen PHQ 2/9     10/05/2022    1:58 PM 08/10/2022    1:30 PM 05/09/2022    1:36 PM 04/04/2022    1:21 PM 02/20/2022   12:58 PM 05/24/2021    2:10 PM  Depression screen PHQ 2/9  Decreased Interest 0 1 0 0 0 0  Down, Depressed, Hopeless 0 1 0 0 0 1  PHQ - 2 Score 0 2 0 0 0 1  Altered sleeping     1   Tired, decreased energy     0   Change in appetite     0   Feeling bad or failure about yourself      0    Trouble concentrating     1   Moving slowly or fidgety/restless     3   Suicidal thoughts     0   PHQ-9 Score     5   Difficult doing work/chores     Not difficult at all       Review of Systems  Musculoskeletal:  Positive for arthralgias, gait problem and neck pain.       Legs, hands, shoulders, elbows      Objective:   Physical Exam Vitals and nursing note reviewed.  Constitutional:      Appearance: Normal appearance.  Cardiovascular:     Rate and Rhythm: Normal rate and regular rhythm.     Pulses: Normal pulses.     Heart sounds: Normal heart sounds.  Pulmonary:     Effort: Pulmonary effort is normal.     Breath sounds: Normal breath sounds.     Comments: Continuous Oxygen: 2 Liters nasal cannula Musculoskeletal:     Cervical back: Normal range of motion and neck supple.     Comments: Normal Muscle Bulk and Muscle Testing Reveals:  Upper Extremities: Full ROM and Muscle Strength 5/5  Lumbar Paraspinal Tenderness: L-4-L-5 Lower Extremities: Full ROM and Muscle Strength 5/5 Arises from Table slowly using walker for support [place] Based  Gait     Skin:    General: Skin is warm and dry.  Neurological:     Mental Status: She is alert and oriented to person, place, and time.  Psychiatric:        Mood and Affect: Mood normal.        Behavior: Behavior normal.         Assessment & Plan:  Guillain Barre Syndrome: Continue current medication regimen. Continue Outpatient Therapy. Continue current medication regimen. Continue to Monitor. 12/07/2022 Polyarthralgia: Continue current medication regimen . Continue to Monitor. 12/07/2022 Chronic Pain of Both Knees: Continue Outpatient Therapy. Continue current medication regimen. Continue to Monitor. 12/07/2022 Chronic Pain Syndrome: Refilled" Oxycodone 5 mg three times a day as needed for pain. #90. We will continue the opioid monitoring program, this consists of regular clinic visits, examinations, urine drug screen, pill  counts as well as use of [place]  Controlled Substance Reporting system. A 12 month History has been reviewed on the [place] Controlled Substance Reporting System on 12/07/2021   F/U in 2 month

## 2022-12-15 ENCOUNTER — Other Ambulatory Visit: Payer: Self-pay | Admitting: Medical

## 2022-12-20 ENCOUNTER — Telehealth: Payer: Self-pay | Admitting: Medical

## 2022-12-20 NOTE — Telephone Encounter (Signed)
Pt stated home health doctor doesn't want her to come out the house due to lung infection , they are doing a house call on Friday and she will update [place] on what they say and to schedule an appt.. She stated she is on a zpak & prednisone for the infection

## 2022-12-20 NOTE — Telephone Encounter (Signed)
Pt called to cancel her appt on 1/19. Stated she is feeling very SOB and might need to go to the hospital. Advised I could transfer to a nurse but she stated she has a nurse with her. She is also in some type of breathing program that sends drs to make house calls to eval if she needs to go to the ED. She stated she will call back to r/s when she is feeling better.

## 2022-12-22 ENCOUNTER — Ambulatory Visit: Payer: 59 | Admitting: Medical

## 2023-01-01 ENCOUNTER — Telehealth: Payer: Self-pay

## 2023-01-01 NOTE — Telephone Encounter (Signed)
Opened in error

## 2023-01-11 ENCOUNTER — Encounter: Payer: Self-pay | Admitting: Internal Medicine

## 2023-01-11 ENCOUNTER — Ambulatory Visit (INDEPENDENT_AMBULATORY_CARE_PROVIDER_SITE_OTHER): Payer: 59 | Admitting: Internal Medicine

## 2023-01-11 VITALS — BP 124/74 | HR 94 | Ht 63.5 in | Wt 176.2 lb

## 2023-01-11 DIAGNOSIS — E042 Nontoxic multinodular goiter: Secondary | ICD-10-CM | POA: Diagnosis not present

## 2023-01-11 DIAGNOSIS — E0590 Thyrotoxicosis, unspecified without thyrotoxic crisis or storm: Secondary | ICD-10-CM

## 2023-01-11 DIAGNOSIS — E559 Vitamin D deficiency, unspecified: Secondary | ICD-10-CM

## 2023-01-11 LAB — TSH: TSH: 0.26 u[IU]/mL — ABNORMAL LOW (ref 0.35–5.50)

## 2023-01-11 LAB — T4, FREE: Free T4: 0.65 ng/dL (ref 0.60–1.60)

## 2023-01-11 LAB — T3, FREE: T3, Free: 2.6 pg/mL (ref 2.3–4.2)

## 2023-01-11 NOTE — Progress Notes (Signed)
Patient ID: Jill Spence, female   DOB: 11-13-1951, 72 y.o.   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  She previously saw Dr. Loanne Drilling, but last visit with me 6 months ago.   She is here with her mental health caregiver.  Interim history: Patient has been doing well since last visit.  She still has intermittent problems swallowing especially when she lays on her back watching TV, and also feels mucus in her neck.  Thyrotoxicosis: Patient was found to have abnormal thyroid tests at least in 2018 -per my review of the records.  She is currently on methimazole 5 mg daily.  I reviewed pt's thyroid tests: Lab Results  Component Value Date   TSH 0.69 07/11/2022   TSH 0.59 03/23/2022   TSH 0.81 12/22/2021   TSH 0.32 (L) 09/21/2021   TSH 0.70 07/26/2021   TSH 1.91 04/26/2021   TSH <0.01 (L) 03/15/2021   TSH <0.01 Repeated and verified X2. (L) 10/19/2020   FREET4 0.62 07/11/2022   FREET4 0.65 03/23/2022   FREET4 0.56 (L) 12/22/2021   FREET4 0.51 (L) 09/21/2021   FREET4 0.57 (L) 07/26/2021   FREET4 0.37 (L) 04/26/2021   FREET4 0.51 (L) 03/15/2021   FREET4 0.81 10/19/2020   T3FREE 2.4 07/11/2022   Lab Results  Component Value Date   T3FREE 2.4 07/11/2022     Ref Range & Units 4 yr ago  Thyroid Stimulating Hormone (TSH) 0.34 - 5.66 IU/mL 0.04 Low    Thyroxine, Free (FT4) 0.52 - 1.21 ng/dL 0.66   Resulting Agency  [facility]   Specimen Collected: 09/24/17 11:35 Last Resulted: 09/24/17 13:59  Received From: [facility]  Result Received: 03/15/21 13:35   Antithyroid antibodies: No results found for: "TSI"  Multiple thyroid nodules:  Thyroid U/S (10/26/2020): Parenchymal Echotexture: Moderately heterogenous  Isthmus: 0.3 cm  Right lobe: 4.6 x 1.5 x 1.6 cm  Left lobe: 4.4 x 2.3 x 2.4 cm   _________________________________________________________   Estimated total number of nodules >/= 1 cm: 1 _________________________________________________________   Nodule # 1:  Location: Right; Inferior  Maximum size: 0.9 cm; Other 2 dimensions: 0.6 x 0.5 cm  Composition: solid/almost completely solid (2)  Echogenicity: hypoechoic (2) Given size (<0.9 cm) and appearance, this nodule does NOT meet TI-RADS criteria for biopsy or dedicated follow-up.  _________________________________________________________   Nodule # 2:  Location: Right; Mid  Maximum size: 0.8 cm; Other 2 dimensions: 0.5 x 0 5 cm  Composition: spongiform (0)  Echogenicity: isoechoic (1)  This nodule does NOT meet TI-RADS criteria for biopsy or dedicated follow-up.  _________________________________________________________   Nodule # 3:  Location: Right; Superior medial  Maximum size: 0.6 cm; Other 2 dimensions: 0.5 x 0.5 cm  Composition: solid/almost completely solid (2)  Echogenicity: hypoechoic (2) Given size (<0.9 cm) and appearance, this nodule does NOT meet TI-RADS criteria for biopsy or dedicated follow-up.  _________________________________________________________   Nodule # 4:  Location: Right; Superior  Maximum size: 0.8 cm; Other 2 dimensions: 0.5 x 0.7 cm  Composition: mixed cystic and solid (1)  Echogenicity: hypoechoic (2)  Given size (<1.4 cm) and appearance, this nodule does NOT meet TI-RADS criteria for biopsy or dedicated follow-up.  _________________________________________________________   Nodule # 5:  Location: Left; Inferior  Maximum size: 2.9 cm; Other 2 dimensions: 2.3 x 2.2 cm  Composition: mixed cystic and solid (1)  Echogenicity: hypoechoic (2) Margins: lobulated/irregular (2)  Echogenic foci: macrocalcifications (1)  ACR TI-RADS total points: 6. ACR TI-RADS risk category: TR4 (4-6 points)  **Given size (>/= 1.5 cm) and appearance, fine needle aspiration of this  moderately suspicious nodule should be considered based on TI-RADS criteria.  _________________________________________________________   Nodule # 6:  Location: Left; Superior  Maximum size: 0.7 cm; Other 2 dimensions: 0.5 x 0.7 cm  Composition: solid/almost completely solid (2)  Echogenicity: hypoechoic (2) Given size (<0.9 cm) and appearance, this nodule does NOT meet TI-RADS criteria for biopsy or dedicated follow-up.  ________________________________________________________   IMPRESSION: 1. Multinodular goiter. 2. Dominant nodule in the left inferior thyroid measuring up to 2.9 cm (labeled 5) which meets criteria (TI-RADS category 4) for tissue sampling. Recommend ultrasound-guided fine-needle aspiration. 3. Additional scattered thyroid nodules appear benign. These nodules require additional dedicated follow-up.    Thyroid uptake and scan (01/13/2021): Dominant hot nodule in the left thyroid lobe with suppression of the  rest of the gland. Findings suggest a hyperfunctioning thyroid  nodule.  4 hour I-131 uptake = 10.1% (normal 5-20%)   24 hour I-131 uptake = 22.4% (normal 10-30%)   IMPRESSION:  Findings suggest a hyperfunctioning nodule in the left thyroid lobe  suppressing the rest of the gland.   PET scan (05/01/2021): The thyroid nodules were not FDG avid  Thyroid U/S (07/18/2022): Parenchymal Echotexture: Moderately heterogenous  Isthmus: 0.3 cm  Right lobe: 4.1 cm x 1.4 cm x 1.9 cm  Left lobe: 4.6 cm x 2.5 cm x 2.4 cm  ____________________________________________________   Nodule labeled 1, superior right thyroid, 6 mm. Nodule has cystic/spongiform characteristics and does not meet criteria for surveillance.   Nodule labeled 2, superior right thyroid, 8 mm. Nodule has cystic/spongiform characteristics and does not meet criteria for surveillance.   Nodule labeled 3, mid right thyroid, 8 mm. Nodule has spongiform/cystic characteristics and does not meet  criteria for surveillance.   Nodule labeled 4, inferior right thyroid, 7 mm, decreased in size, TR 4 characteristics. Nodule does not meet criteria for surveillance.   Nodule labeled 5, mid left thyroid, unchanged 7 mm. Nodule has TR 4 characteristics and does not meet criteria for surveillance.   Nodule labeled 6, inferior left thyroid, 3.1 cm. Nodule has TR 4 characteristics and meets criteria for biopsy.   No adenopathy   IMPRESSION: Multinodular thyroid again demonstrated.   Left inferior thyroid nodule again meets criteria for biopsy, as designated by the newly established ACR TI-RADS criteria, and referral for biopsy is recommended.    FNA (09/13/2022): Atypia of undetermined significance (Bethesda category III)  Afirma:  benign  Mentions: - + hoarseness - + dysphagia - + choking - + SOB with lying down These improved since last visit, but still having them intermittently  No: - tremors - anxiety - palpitations - hyperdefecation - weight loss  Pt does have a FH of thyroid ds.: nieces. No FH of thyroid cancer. No h/o radiation tx to head or neck. + steroid use - Prednisone 10 mg daily prn -last dose last night. No herbal supplements.  Not on a B complex.  She also has a history of vitamin D deficiency: - per PCP   Reviewed vitamin D levels: Lab Results  Component Value Date   VD25OH 55.55 03/23/2022   VD25OH 55.92 12/22/2021   VD25OH 78.75 09/21/2021   VD25OH 57.48 07/26/2021   On vitamin D 50,000 units weekly.  She has IBS -diarrhea.  ROS: + see HPI  Past Medical History:  Diagnosis Date   Anemia    low iron   Anxiety    Arthritis    Bipolar disorder (HCC)    COPD (chronic obstructive pulmonary disease) (HCC)    Family history of adverse reaction to anesthesia    mom "was put to sleep and she never woke up" -    GERD (gastroesophageal reflux disease)    Guillain Barr syndrome (HCC)    numbness in toes, legs hurt   Headache     migraines as a teenager   IBS (irritable bowel syndrome)    Lump, breast    removed years ago per Pt.   Pneumonia    Restless leg syndrome    Seizures ([place])    only has one when she gets upset, has "quiet' seizures   Past Surgical History:  Procedure Laterality Date   BREAST SURGERY     COLONOSCOPY     EYE SURGERY Bilateral    cataract surgery with lens implants   MANDIBLE RECONSTRUCTION     MINOR REMOVAL OF MANDIBULAR HARDWARE Right 10/15/2015   Procedure: MINOR REMOVAL OF Right MANDIBULAR HARDWARE;  Surgeon: Diona Browner, DDS;  Location: [facility];  Service: Oral Surgery;  Laterality: Right;   TONSILLECTOMY     Social History   Socioeconomic History   Marital status: Widowed    Spouse name: Not on file   Number of children: Not on file   Years of education: Not on file   Highest education level: Not on file  Occupational History   Not on file  Tobacco Use   Smoking status: Former    Packs/day: 0.25    Years: 6.00    Total pack years: 1.50    Types: Cigarettes    Quit date: 10/14/2007    Years since quitting: 15.2   Smokeless tobacco: Never  Vaping Use   Vaping Use: Never used  Substance and Sexual Activity   Alcohol use: No   Drug use: No   Sexual activity: Not on file  Other Topics Concern   Not on file  Social History Narrative   Not on file   Social Determinants of Health   Financial Resource Strain: Not on file  Food Insecurity: Not on file  Transportation Needs: Not on file  Physical Activity: Not on file  Stress: Not on file  Social Connections: Not on file  Intimate Partner Violence: Not on file   Current Outpatient Medications on File Prior to Visit  Medication Sig Dispense Refill   albuterol (PROVENTIL HFA;VENTOLIN HFA) 108 (90 BASE) MCG/ACT inhaler Inhale 2 puffs into the lungs every 6 (six) hours as needed for wheezing.     albuterol (PROVENTIL) (2.5 MG/3ML) 0.083% nebulizer solution Take 2.5 mg by nebulization every 6 (six) hours as needed for  wheezing.     alendronate (FOSAMAX) 70 MG tablet Take 70 mg by mouth every Sunday. Take with a full glass of water on an empty stomach.     ARIPiprazole (ABILIFY) 15 MG tablet Take 15 mg by mouth daily.     azithromycin (ZITHROMAX) 250 MG tablet Take as directed 6 tablet 0   B Complex Vitamins (VITAMIN B COMPLEX) TABS Take 1 tablet by mouth daily. 30 tablet 5   benzonatate (TESSALON) 200 MG capsule Take 200 mg by mouth 3 (three) times daily as needed.     benztropine (COGENTIN) 0.5 MG tablet Take 0.5 mg by mouth 2 (two) times daily.     cetirizine (ZYRTEC) 10  MG tablet TAKE ONE TABLET BY MOUTH EVERY DAY FOR ALLERGIES 90 tablet 3   Cholecalciferol (VITAMIN D3) 125 MCG (5000 UT) CAPS Take 1 capsule (5,000 Units total) by mouth daily. 30 capsule 11   cromolyn (OPTICROM) 4 % ophthalmic solution Place 1 drop into both eyes 4 times daily.     doxycycline (ADOXA) 100 MG tablet Take 100 mg by mouth 2 (two) times daily.     famciclovir (FAMVIR) 250 MG tablet Take 250 mg by mouth 2 (two) times daily.     Fluticasone-Umeclidin-Vilant (TRELEGY ELLIPTA) 100-62.5-25 MCG/ACT AEPB Inhale 1 puff into the lungs daily. 180 each 3   Fluticasone-Umeclidin-Vilant (TRELEGY ELLIPTA) 100-62.5-25 MCG/INH AEPB Inhale into the lungs.     gabapentin (NEURONTIN) 400 MG capsule Take 1,200 mg by mouth 3 (three) times daily.     hydrOXYzine (ATARAX) 10 MG tablet 1-2 tab q hs prn anxiety or insomnia 30 tablet 0   ibuprofen (ADVIL) 800 MG tablet Take 800 mg by mouth 3 (three) times daily.     ipratropium-albuterol (DUONEB) 0.5-2.5 (3) MG/3ML SOLN Take 3 mLs by nebulization every 4 (four) hours as needed. 360 mL 5   loperamide (IMODIUM) 2 MG capsule Take 2 mg by mouth daily.     magnesium oxide (MAG-OX) 400 MG tablet Take 250 mg by mouth daily.     meloxicam (MOBIC) 7.5 MG tablet Take 7.5 mg by mouth daily.     methimazole (TAPAZOLE) 5 MG tablet Take 1 tablet (5 mg total) by mouth daily. 90 tablet 3   montelukast (SINGULAIR) 10  MG tablet Take 1 tablet (10 mg total) by mouth at bedtime. 90 tablet 3   oxcarbazepine (TRILEPTAL) 600 MG tablet Take 600 mg by mouth 2 (two) times daily.     oxyCODONE (ROXICODONE) 5 MG immediate release tablet Take 1 tablet (5 mg total) by mouth 3 (three) times daily as needed for severe pain. 90 tablet 0   pantoprazole (PROTONIX) 40 MG tablet Take 40 mg by mouth daily.     potassium chloride SA (KLOR-CON) 20 MEQ tablet Take 1 tablet (20 mEq total) by mouth 2 (two) times daily for 7 days. 14 tablet 0   predniSONE (DELTASONE) 20 MG tablet Take 78m for 5 days 10 tablet 0   ranitidine (ZANTAC) 300 MG capsule Take 300 mg by mouth every evening.     traZODone (DESYREL) 50 MG tablet Take 50 mg by mouth at bedtime.     Vitamin D, Ergocalciferol, (DRISDOL) 1.25 MG (50000 UNIT) CAPS capsule Take 50,000 Units by mouth once a week.     No current facility-administered medications on file prior to visit.   Allergies  Allergen Reactions   Aspirin    Influenza Vaccines    Family History  Problem Relation Age of Onset   Cancer Father     PE: BP 124/74 (BP Location: Left Arm, Patient Position: Sitting, Cuff Size: Normal)   Pulse 94   Ht 5' 3.5" (1.613 m)   Wt 176 lb 3.2 oz (79.9 kg)   SpO2 (!) 86% Comment: Pt restarted her O2.  BMI 30.72 kg/m  Wt Readings from Last 3 Encounters:  01/11/23 176 lb 3.2 oz (79.9 kg)  12/07/22 176 lb (79.8 kg)  11/21/22 172 lb (78 kg)   Constitutional: overweight, in NAD, walks with a walker, on oxygen (3 lpm) Eyes: no exophthalmos, no lid lag, no stare ENT: no thyromegaly and no thyroid nodules palpated, no cervical lymphadenopathy Cardiovascular: Tachycardia, RR, No MRG Respiratory:  CTA B Musculoskeletal: no deformities Skin: no rashes Neurological: no tremor with outstretched hands  ASSESSMENT: 1.  Toxic adenoma with thyrotoxicosis  2.  Thyroid nodules  3.  Vitamin D deficiency - Per PCP  PLAN:  1. Patient with with history of thyrotoxicosis,  with thyrotoxic symptoms: Weight loss, heat intolerance, hyperdefecation, palpitations, anxiety. -Patient had a thyroid uptake and scan in 2022 and this showed a left thyroid hyperfunctioning adenoma as a possible cause for her thyrotoxicosis -She is currently on methimazole 5 mg daily, with good results. -Latest thyroid tests were normal 07/2022 -At today's visit we will recheck the TSH, free T4, free T3 -she had prednisone last night.  She takes this on an as-needed basis -If the tests return abnormal, we may need RAI treatment.  She agrees to have this if absolutely necessary. - RTC in 6 months  2.  Thyroid nodules -Patient with a history of subcentimeter right thyroid nodules, not worrisome, but also a large left thyroid nodule that showed lobulation and macrocalcifications -This is most likely the site of thyroid hormone overproduction, detected on the thyroid uptake and scan -We discussed that overactive nodules are rarely malignant, however, due to the distorted architecture, I did not suggest a new thyroid ultrasound at last visit and then a biopsy. -She had a biopsy in 09/2022 and the results were inconclusive, however, the Afirma molecular marker returned benign -Therefore, we discussed that the risk of cancer in this nodule is very low -At last visit she had worsening neck compression symptoms including dysphagia, shortness of breath, and choking.  She was worried about getting thyroid surgery due to poor lung status and was also worried about keloid.  We discussed about possibly doing radioactive iodine treatment, which can shrink the nodules, but if we proceed with this, we need another thyroid scan.  At today's visit, though, she only has intermittent symptoms, and she does not feel that these are severe enough to prompt thyroidectomy, especially this is a risky procedure due to her lung condition.  I explained that RAI treatment can also affect her lungs.  For now, she agrees with  expectant follow-up.  Component     Latest Ref Rng 01/11/2023  T4,Free(Direct)     0.60 - 1.60 ng/dL 0.65   TSH     0.35 - 5.50 uIU/mL 0.26 (L)   Triiodothyronine,Free,Serum     2.3 - 4.2 pg/mL 2.6   TSH is slightly low, however, this could be related to prednisone dosing the night before.  I would suggest to continue the same dose of methimazole for now but return for labs in 1 month, ideally off prednisone.  Philemon Kingdom, MD PhD [facility] Endocrinology

## 2023-01-11 NOTE — Patient Instructions (Addendum)
Please stop at the lab.  Please continue Methimazole 5 mg daily.  You should have an endocrinology follow-up appointment in 6 months.

## 2023-01-22 ENCOUNTER — Ambulatory Visit (INDEPENDENT_AMBULATORY_CARE_PROVIDER_SITE_OTHER): Payer: 59 | Admitting: Medical

## 2023-01-22 ENCOUNTER — Telehealth: Payer: Self-pay | Admitting: Medical

## 2023-01-22 ENCOUNTER — Telehealth: Payer: Self-pay

## 2023-01-22 VITALS — BP 155/76 | HR 83 | Temp 98.0°F | Resp 18 | Ht 63.5 in | Wt 174.6 lb

## 2023-01-22 DIAGNOSIS — R197 Diarrhea, unspecified: Secondary | ICD-10-CM

## 2023-01-22 DIAGNOSIS — J449 Chronic obstructive pulmonary disease, unspecified: Secondary | ICD-10-CM

## 2023-01-22 DIAGNOSIS — K580 Irritable bowel syndrome with diarrhea: Secondary | ICD-10-CM

## 2023-01-22 DIAGNOSIS — G610 Guillain-Barre syndrome: Secondary | ICD-10-CM

## 2023-01-22 DIAGNOSIS — R030 Elevated blood-pressure reading, without diagnosis of hypertension: Secondary | ICD-10-CM

## 2023-01-22 DIAGNOSIS — E559 Vitamin D deficiency, unspecified: Secondary | ICD-10-CM

## 2023-01-22 MED ORDER — BENZONATATE 100 MG PO CAPS: 100.0000 mg | ORAL_CAPSULE | Freq: Three times a day (TID) | ORAL | 1 refills | Status: DC | PRN

## 2023-01-22 MED ORDER — LOPERAMIDE HCL 2 MG PO CAPS: ORAL_CAPSULE | ORAL | 1 refills | Status: DC

## 2023-01-22 MED ORDER — IBUPROFEN 800 MG PO TABS: 800.0000 mg | ORAL_TABLET | Freq: Three times a day (TID) | ORAL | 2 refills | Status: DC | PRN

## 2023-01-22 MED ORDER — CETIRIZINE HCL 10 MG PO TABS: ORAL_TABLET | ORAL | 3 refills | Status: DC

## 2023-01-22 MED ORDER — VITAMIN D3 125 MCG (5000 UT) PO CAPS: 5000.0000 [IU] | ORAL_CAPSULE | Freq: Every day | ORAL | 11 refills | Status: DC

## 2023-01-22 MED ORDER — VITAMIN D (ERGOCALCIFEROL) 1.25 MG (50000 UNIT) PO CAPS: ORAL_CAPSULE | ORAL | 0 refills | Status: DC

## 2023-01-22 NOTE — Progress Notes (Signed)
Subjective:    Patient ID: Jill Spence, female    DOB: [birth year], 72 y.o.   MRN: [id number]  HPI  Pt in for follow up.   Pt last visit AVS.  "1. Stage 3 severe COPD by GOLD classification ([place]) Check cmp today.   2. Diarrhea, unspecified type Hydrate well. Bland diet. Use ibs meds presently.  - Stool Culture; Future - Ova and parasite examination - Clostridium Difficile by PCR - CBC w/Diff - Comp Met (CMET)   3 Guillain Barr syndrome ([place])- polarthralgia, chronic pain and chronic pain syndrome continue with physical medicine.     4. Gastroesophageal reflux disease without esophagitis- continue protonix.   5. Neuropathy Continue gabapentin   6. Irritable bowel syndrome with diarrhea Refer to gi MD. Continue loperamide and bentyl.  - Ambulatory referral to Gastroenterology   7. Elevated blood pressure reading  Want you to check bp at home daily over next week and update me on readings by my chart.   8. For allergic rhinitis refilled your montelukast.   9. Asked home health nurse to send me update one week before needs rx refill. Will send 90 tab supply on most meds that uses chronically.   10. Follow up in 1 month or sooner if needed.   11. For chapped lips recommend getting chap stick with vitamin E."   Pt want/needs new pulmonoloist. I placed referral but no one has called her.  Still having some degree of sob daily. Not more severe as before. Just states not improved. She is on trelegy and 02. Has current pulmonlogist and wants to see new one. Was supposed to be schedule for early March on chart review.  Ibs symtposm improved with diet but she request bentyl and immodium to use if severe loose stools again. She never turned in stool studies.  Request ibuprofen for joint pain. She wants to stop meloxicam. Prior use iburofen no reaction/side effects.      Review of Systems  Constitutional:  Negative for chills, fatigue and fever.  HENT:  Negative for  congestion, drooling and ear discharge.   Respiratory:  Negative for cough, chest tightness, shortness of breath and wheezing.   Cardiovascular:  Negative for chest pain and palpitations.  Gastrointestinal:  Negative for abdominal pain, diarrhea, nausea, rectal pain and vomiting.       See hpi.  Genitourinary:  Negative for dyspareunia and dysuria.  Musculoskeletal:  Negative for back pain and joint swelling.  Skin:  Negative for pallor and rash.  Neurological:  Negative for dizziness, tremors, speech difficulty, weakness, light-headedness and headaches.  Hematological:  Negative for adenopathy. Does not bruise/bleed easily.  Psychiatric/Behavioral:  Negative for behavioral problems and confusion.     Past Medical History:  Diagnosis Date   Anemia    low iron   Anxiety    Arthritis    Bipolar disorder (HCC)    COPD (chronic obstructive pulmonary disease) (HCC)    Family history of adverse reaction to anesthesia    mom "was put to sleep and she never woke up" -    GERD (gastroesophageal reflux disease)    Guillain Barr syndrome (HCC)    numbness in toes, legs hurt   Headache    migraines as a teenager   IBS (irritable bowel syndrome)    Lump, breast    removed years ago per Pt.   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  gets upset, has "quiet' seizures     Social History   Socioeconomic History   Marital status: Widowed    Spouse name: Not on file   Number of children: Not on file   Years of education: Not on file   Highest education level: Not on file  Occupational History   Not on file  Tobacco Use   Smoking status: Former    Packs/day: 0.25    Years: 6.00    Total pack years: 1.50    Types: Cigarettes    Quit date: 10/14/2007    Years since quitting: 15.2   Smokeless tobacco: Never  Vaping Use   Vaping Use: Never used  Substance and Sexual Activity   Alcohol use: No   Drug use: No   Sexual activity: Not on file   Other Topics Concern   Not on file  Social History Narrative   Not on file   Social Determinants of Health   Financial Resource Strain: Not on file  Food Insecurity: Not on file  Transportation Needs: Not on file  Physical Activity: Not on file  Stress: Not on file  Social Connections: Not on file  Intimate Partner Violence: Not on file    Past Surgical History:  Procedure Laterality Date   BREAST SURGERY     COLONOSCOPY     EYE SURGERY Bilateral    cataract surgery with lens implants   MANDIBLE RECONSTRUCTION     MINOR REMOVAL OF MANDIBULAR HARDWARE Right 10/15/2015   Procedure: MINOR REMOVAL OF Right MANDIBULAR HARDWARE;  Surgeon: Diona Browner, DDS;  Location: [facility];  Service: Oral Surgery;  Laterality: Right;   TONSILLECTOMY      Family History  Problem Relation Age of Onset   Cancer Father     Allergies  Allergen Reactions   Aspirin    Influenza Vaccines     Current Outpatient Medications on File Prior to Visit  Medication Sig Dispense Refill   albuterol (PROVENTIL HFA;VENTOLIN HFA) 108 (90 BASE) MCG/ACT inhaler Inhale 2 puffs into the lungs every 6 (six) hours as needed for wheezing.     albuterol (PROVENTIL) (2.5 MG/3ML) 0.083% nebulizer solution Take 2.5 mg by nebulization every 6 (six) hours as needed for wheezing.     alendronate (FOSAMAX) 70 MG tablet Take 70 mg by mouth every Sunday. Take with a full glass of water on an empty stomach.     ARIPiprazole (ABILIFY) 15 MG tablet Take 15 mg by mouth daily.     azithromycin (ZITHROMAX) 250 MG tablet Take as directed 6 tablet 0   B Complex Vitamins (VITAMIN B COMPLEX) TABS Take 1 tablet by mouth daily. 30 tablet 5   benzonatate (TESSALON) 200 MG capsule Take 200 mg by mouth 3 (three) times daily as needed.     benztropine (COGENTIN) 0.5 MG tablet Take 0.5 mg by mouth 2 (two) times daily.     cromolyn (OPTICROM) 4 % ophthalmic solution Place 1 drop into both eyes 4 times daily.     doxycycline (ADOXA) 100 MG  tablet Take 100 mg by mouth 2 (two) times daily.     famciclovir (FAMVIR) 250 MG tablet Take 250 mg by mouth 2 (two) times daily.     Fluticasone-Umeclidin-Vilant (TRELEGY ELLIPTA) 100-62.5-25 MCG/ACT AEPB Inhale 1 puff into the lungs daily. 180 each 3   Fluticasone-Umeclidin-Vilant (TRELEGY ELLIPTA) 100-62.5-25 MCG/INH AEPB Inhale into the lungs.     gabapentin (NEURONTIN) 400 MG capsule Take 1,200 mg by mouth 3 (three) times  daily.     hydrOXYzine (ATARAX) 10 MG tablet 1-2 tab q hs prn anxiety or insomnia 30 tablet 0   ipratropium-albuterol (DUONEB) 0.5-2.5 (3) MG/3ML SOLN Take 3 mLs by nebulization every 4 (four) hours as needed. 360 mL 5   magnesium oxide (MAG-OX) 400 MG tablet Take 250 mg by mouth daily.     methimazole (TAPAZOLE) 5 MG tablet Take 1 tablet (5 mg total) by mouth daily. 90 tablet 3   montelukast (SINGULAIR) 10 MG tablet Take 1 tablet (10 mg total) by mouth at bedtime. 90 tablet 3   oxcarbazepine (TRILEPTAL) 600 MG tablet Take 600 mg by mouth 2 (two) times daily.     oxyCODONE (ROXICODONE) 5 MG immediate release tablet Take 1 tablet (5 mg total) by mouth 3 (three) times daily as needed for severe pain. 90 tablet 0   pantoprazole (PROTONIX) 40 MG tablet Take 40 mg by mouth daily.     predniSONE (DELTASONE) 20 MG tablet Take 98m for 5 days 10 tablet 0   ranitidine (ZANTAC) 300 MG capsule Take 300 mg by mouth every evening.     traZODone (DESYREL) 50 MG tablet Take 50 mg by mouth at bedtime.     potassium chloride SA (KLOR-CON) 20 MEQ tablet Take 1 tablet (20 mEq total) by mouth 2 (two) times daily for 7 days. 14 tablet 0   No current facility-administered medications on file prior to visit.    BP (!) 155/76   Pulse 83   Temp 98 F (36.7 C)   Resp 18   Ht 5' 3.5" (1.613 m)   Wt 174 lb 9.6 oz (79.2 kg)   SpO2 (!) 89%   BMI 30.44 kg/m        Objective:   Physical Exam  General Mental Status- Alert. General Appearance- Not in acute distress.   Skin General:  Color- Normal Color. Moisture- Normal Moisture.  Neck Carotid Arteries- Normal color. Moisture- Normal Moisture. No carotid bruits. No JVD.  Chest and Lung Exam Auscultation: Breath Sounds:-Normal.  Cardiovascular Auscultation:Rythm- Regular. Murmurs & Other Heart Sounds:Auscultation of the heart reveals- No Murmurs.  Abdomen Inspection:-Inspeection Normal. Palpation/Percussion:Note:No mass. Palpation and Percussion of the abdomen reveal- Non Tender, Non Distended + BS, no rebound or guarding.  Neurologic Cranial Nerve exam:- CN III-XII intact(No nystagmus), symmetric smile. Strength:- 5/5 equal and symmetric strength both upper and lower extremities.       Assessment & Plan:  I placed new referral though she already has appointment. [facility] [place] [id number]: [phone]  Do want you to follow up with current pulmonlogist as last notes states folow up in 3 months.  Diarrhea is now improved after she ate a lot healthier.   Guillain Barr syndrome ([place]- polarthralgia, chronic pain and chronic pain syndrome continue with physical medicine.  Gastroesophageal reflux disease without esophagitis- continue protonix.    Irritable bowel syndrome with diarrhea Refer to gi MD. Continue loperamide and bentyl.Diarrhea is now improved after she ate a lot healthier.  Pt states  gi office never called her.  For arthralgia- rx'd ibuprophen. Dc meloxicam at your request.  Vit d refill today.  Elevated bp today. Will ask to check bp daily and notify me Thursday morning of bp level.  May need to rx bp med. Advise over next 3 days try not use any ibuprofen since can increase bp.  Follow up in 6 weeks or sooner if needed Time spent with patient today  was  42 minutes which consisted of chart revdiew, discussing diagnosis, work up treatment and documentation.

## 2023-01-22 NOTE — Patient Instructions (Addendum)
For copd continue oxygen Stage 3 severe COPD by GOLD classification ([place])   I placed new referral though she already has appointment. Sent to [facility], [place], [place] [address] Phone: [phone]   Do want you to follow up with current pulmonologist as last notes states follow up in 3 months.  Diarrhea is now improved after she ate a lot healthier.   Guillain Barr syndrome ([place])- polarthralgia, chronic pain and chronic pain syndrome continue with physical medicine.  Gastroesophageal reflux disease without esophagitis- continue protonix.    Irritable bowel syndrome with diarrhea Refer to gi MD. Continue loperamide and bentyl.Diarrhea is now improved after she ate a lot healthier.  Pt states  gi office never called her.  For arthralgia- rx'd ibuprofen. Dc meloxicam at your request.  Vit d refill today.  Elevated bp today. Will ask to check bp daily and notify me Thursday morning of bp level.  May need to rx bp med. Advise over next 3 days try not use any ibuprofen since can increase bp.  Follow up in 6 weeks or sooner if needed

## 2023-01-22 NOTE — Telephone Encounter (Signed)
Spoke with someone at [employer] [employer], and they advise that we just need to send a letter with pt name, address, and they they require to be on life support.  Fax attn [place] to [phone].  Letter faxed, confirmation received.  Pt notified while in office

## 2023-01-22 NOTE — Telephone Encounter (Signed)
Patient notified of note below and she will call back on Thursday with bp readings.

## 2023-01-22 NOTE — Telephone Encounter (Signed)
Elevated bp today. Will ask to check bp daily and notify me Thursday morning of bp level.  May need to rx bp med. Advise over next 3 days try not use any ibuprofen since can increase bp.

## 2023-01-24 ENCOUNTER — Other Ambulatory Visit: Payer: Self-pay | Admitting: Physical Medicine and Rehabilitation

## 2023-01-24 ENCOUNTER — Telehealth: Payer: Self-pay | Admitting: *Deleted

## 2023-01-24 ENCOUNTER — Telehealth: Payer: Self-pay | Admitting: Medical

## 2023-01-24 MED ORDER — OXYCODONE HCL 5 MG PO TABS: 5.0000 mg | ORAL_TABLET | Freq: Three times a day (TID) | ORAL | 0 refills | Status: DC | PRN

## 2023-01-24 NOTE — Telephone Encounter (Signed)
In the absence of [name] can you please refill: Patient requesting refill on her Oxycodone: oxyCODONE (ROXICODONE) 5 MG immediate release tablet [id number]   Dose: 5 mg Route: Oral Frequency: 3 times daily PRN for severe pain  Dispense Quantity: 90 tablet Refills: 0   Note to Pharmacy: Do Not Fill Before 12/25/2022  Indications of Use: Chronic Pain       Sig: Take 1 tablet (5 mg total) by mouth 3 (three) times daily as needed for severe pain.       Start Date: 12/07/22 End Date: --  Written Date: 12/07/22 Expiration Date: 06/05/23  Earliest Fill Date: 12/07/22

## 2023-01-24 NOTE — Telephone Encounter (Signed)
ATC and notify patient she does not have vmail.

## 2023-01-24 NOTE — Telephone Encounter (Signed)
Camisha (PCA) called stating the pt needed a letter stating that she needed a way for her living establishment to seal the drafts in her windows. Pt has COPD and the cold air affects her breathing. Camisha would like a call back when letter is ready for pickup.

## 2023-01-26 NOTE — Telephone Encounter (Signed)
PCA called and notified , stated she wull pick up letter today

## 2023-02-20 ENCOUNTER — Encounter: Payer: 59 | Attending: Physical Medicine and Rehabilitation | Admitting: Registered Nurse

## 2023-02-20 ENCOUNTER — Encounter: Payer: Self-pay | Admitting: Registered Nurse

## 2023-02-20 VITALS — BP 116/75 | HR 86 | Ht 63.5 in | Wt 172.0 lb

## 2023-02-20 DIAGNOSIS — M25561 Pain in right knee: Secondary | ICD-10-CM | POA: Diagnosis not present

## 2023-02-20 DIAGNOSIS — Z5181 Encounter for therapeutic drug level monitoring: Secondary | ICD-10-CM | POA: Insufficient documentation

## 2023-02-20 DIAGNOSIS — G8929 Other chronic pain: Secondary | ICD-10-CM | POA: Insufficient documentation

## 2023-02-20 DIAGNOSIS — M2550 Pain in unspecified joint: Secondary | ICD-10-CM | POA: Diagnosis not present

## 2023-02-20 DIAGNOSIS — Z79891 Long term (current) use of opiate analgesic: Secondary | ICD-10-CM | POA: Insufficient documentation

## 2023-02-20 DIAGNOSIS — G894 Chronic pain syndrome: Secondary | ICD-10-CM | POA: Insufficient documentation

## 2023-02-20 DIAGNOSIS — M25562 Pain in left knee: Secondary | ICD-10-CM | POA: Insufficient documentation

## 2023-02-20 DIAGNOSIS — G610 Guillain-Barre syndrome: Secondary | ICD-10-CM | POA: Insufficient documentation

## 2023-02-20 MED ORDER — OXYCODONE HCL 5 MG PO TABS: 5.0000 mg | ORAL_TABLET | Freq: Three times a day (TID) | ORAL | 0 refills | Status: DC | PRN

## 2023-02-20 NOTE — Progress Notes (Signed)
Subjective:    Patient ID: Jill Spence, female    DOB: [birth year], 72 y.o.   MRN: [id number]  HPI: Jill Spence is a 72 y.o. female who is scheduled for a Video Visit, Ms. Nicasio and I  have  discussed the limitations of evaluation and management by telemedicine and the availability of in person appointments. The patient expressed understanding and agreed to proceed She states her pain is located in her bilateral knees and generalized joint pain. She rates her pain 8. Her current exercise regime is walking with her walker.   Ms. Rykowski Morphine equivalent is 22.50 MME.    Last UDS was Performed on 10/05/2022, it was consistent.   Pain Inventory Average Pain 8 Pain Right Now 8 My pain is constant, aching, and throbbing  In the last 24 hours, has pain interfered with the following? General activity 6 Relation with others 6 Enjoyment of life 6 What TIME of day is your pain at its worst? night Sleep (in general) Poor  Pain is worse with:  cold air Pain improves with: medication Relief from Meds: 9  Family History  Problem Relation Age of Onset   Cancer Father    Social History   Socioeconomic History   Marital status: Widowed    Spouse name: Not on file   Number of children: Not on file   Years of education: Not on file   Highest education level: Not on file  Occupational History   Not on file  Tobacco Use   Smoking status: Former    Packs/day: 0.25    Years: 6.00    Additional pack years: 0.00    Total pack years: 1.50    Types: Cigarettes    Quit date: 10/14/2007    Years since quitting: 15.3   Smokeless tobacco: Never  Vaping Use   Vaping Use: Never used  Substance and Sexual Activity   Alcohol use: No   Drug use: No   Sexual activity: Not on file  Other Topics Concern   Not on file  Social History Narrative   Not on file   Social Determinants of Health   Financial Resource Strain: Not on file  Food Insecurity: Not on file  Transportation Needs:  Not on file  Physical Activity: Not on file  Stress: Not on file  Social Connections: Not on file   Past Surgical History:  Procedure Laterality Date   BREAST SURGERY     COLONOSCOPY     EYE SURGERY Bilateral    cataract surgery with lens implants   MANDIBLE RECONSTRUCTION     MINOR REMOVAL OF MANDIBULAR HARDWARE Right 10/15/2015   Procedure: MINOR REMOVAL OF Right MANDIBULAR HARDWARE;  Surgeon: Diona Browner, DDS;  Location: [facility];  Service: Oral Surgery;  Laterality: Right;   TONSILLECTOMY     Past Surgical History:  Procedure Laterality Date   BREAST SURGERY     COLONOSCOPY     EYE SURGERY Bilateral    cataract surgery with lens implants   MANDIBLE RECONSTRUCTION     MINOR REMOVAL OF MANDIBULAR HARDWARE Right 10/15/2015   Procedure: MINOR REMOVAL OF Right MANDIBULAR HARDWARE;  Surgeon: Diona Browner, DDS;  Location: [facility];  Service: Oral Surgery;  Laterality: Right;   TONSILLECTOMY     Past Medical History:  Diagnosis Date   Anemia    low iron   Anxiety    Arthritis    Bipolar disorder (HCC)    COPD (chronic obstructive pulmonary disease) ([place])  Family history of adverse reaction to anesthesia    mom "was put to sleep and she never woke up" -    GERD (gastroesophageal reflux disease)    Guillain Barr syndrome (HCC)    numbness in toes, legs hurt   Headache    migraines as a teenager   IBS (irritable bowel syndrome)    Lump, breast    removed years ago per Pt.   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 gets upset, has "quiet' seizures   BP 116/75   Pulse 86   Ht 5' 3.5" (1.613 m)   Wt 172 lb (78 kg)   BMI 29.99 kg/m   Opioid Risk Score:   Fall Risk Score:  `1  Depression screen PHQ 2/9     02/20/2023    1:54 PM 12/07/2022    1:42 PM 10/05/2022    1:58 PM 08/10/2022    1:30 PM 05/09/2022    1:36 PM 04/04/2022    1:21 PM 02/20/2022   12:58 PM  Depression screen PHQ 2/9  Decreased Interest 0 0 0 1 0 0 0  Down,  Depressed, Hopeless 0 0 0 1 0 0 0  PHQ - 2 Score 0 0 0 2 0 0 0  Altered sleeping       1  Tired, decreased energy       0  Change in appetite       0  Feeling bad or failure about yourself        0  Trouble concentrating       1  Moving slowly or fidgety/restless       3  Suicidal thoughts       0  PHQ-9 Score       5  Difficult doing work/chores       Not difficult at all     Review of Systems  Constitutional: Negative.   HENT: Negative.    Eyes: Negative.   Respiratory: Negative.    Cardiovascular: Negative.   Gastrointestinal: Negative.   Endocrine: Negative.   Genitourinary: Negative.   Musculoskeletal:  Positive for arthralgias.       Knee pain  Skin: Negative.   Allergic/Immunologic: Negative.   Neurological:  Positive for weakness.  Hematological: Negative.   Psychiatric/Behavioral: Negative.    All other systems reviewed and are negative.      Objective:   Physical Exam Vitals and nursing note reviewed.  Musculoskeletal:     Comments: No Physical Exam: Video Visit         Assessment & Plan:  Guillain Barre Syndrome: Continue current medication regimen. Continue Outpatient Therapy. Continue current medication regimen. Continue to Monitor. 02/20/2023 Polyarthralgia: Continue current medication regimen . Continue to Monitor. 02/20/2023 Chronic Pain of Both Knees: Continue Outpatient Therapy. Continue current medication regimen. Continue to Monitor. 02/20/2023 Chronic Pain Syndrome: Refilled" Oxycodone 5 mg three times a day as needed for pain. #90. We will continue the opioid monitoring program, this consists of regular clinic visits, examinations, urine drug screen, pill counts as well as use of [place] Controlled Substance Reporting system. A 12 month History has been reviewed on the [place] Controlled Substance Reporting System on 02/19/2022   F/U in 2 month

## 2023-03-05 ENCOUNTER — Telehealth: Payer: Self-pay | Admitting: Medical

## 2023-03-05 ENCOUNTER — Other Ambulatory Visit (INDEPENDENT_AMBULATORY_CARE_PROVIDER_SITE_OTHER): Payer: 59

## 2023-03-05 DIAGNOSIS — E0590 Thyrotoxicosis, unspecified without thyrotoxic crisis or storm: Secondary | ICD-10-CM

## 2023-03-05 LAB — TSH: TSH: 0.52 u[IU]/mL (ref 0.35–5.50)

## 2023-03-05 LAB — T4, FREE: Free T4: 0.66 ng/dL (ref 0.60–1.60)

## 2023-03-05 LAB — T3, FREE: T3, Free: 2.9 pg/mL (ref 2.3–4.2)

## 2023-03-05 NOTE — Telephone Encounter (Signed)
Pt came in office stating is needing refill on Diclofenac Sodium Topical Gel 1%- pt states is complete out (pt was informed that meds was on her list- pt mentioned that last one was from her prior doctor- but the PCP is aware, pt is also needing refill on   Medication ibuprofen (ADVIL) 800 MG tablet [[id number]], Pt stated is needing muscle spasms  meds but could not remember name of that meds- pt daughter will call to give exact name of meds today. Any question call pt at [phone]. Sent to [facility], [facility] [facility]. [phone] [address], [place] [address] Phone: [phone]  Fax: [phone]. Please advise. (Pt's daughter was also informed could be possible in needing an appt if provider requested it)  .

## 2023-03-06 MED ORDER — IBUPROFEN 800 MG PO TABS: 800.0000 mg | ORAL_TABLET | Freq: Three times a day (TID) | ORAL | 2 refills | Status: DC | PRN

## 2023-03-06 MED ORDER — DICLOFENAC SODIUM 1 % EX GEL: 4.0000 g | Freq: Four times a day (QID) | CUTANEOUS | 3 refills | Status: DC

## 2023-03-06 NOTE — Telephone Encounter (Signed)
Sent in ibuprofen and voltaren gel as did find that on current list. Bentyl is not on list and wondering did her GI MD prescribe that? I would like to get some notes and review reason on that prescription. They dx her with ibs? Ask her to try to get bentyl from her former GI MD.  If pain significant then ask her to schedule appointment with me or go to ED if severe. Make appt with me at 11 am. Have no else scheduled behind her between her appt and 12.

## 2023-03-06 NOTE — Telephone Encounter (Signed)
Pt stated she is also needing dicyclomine 20mg  for side pain. They stated this may have been originally prescribed by a previous dr but they are hoping [facility] can fill it.

## 2023-03-06 NOTE — Addendum Note (Signed)
Addended by: Anabel Halon on: 03/06/2023 09:08 PM   Modules accepted: Orders

## 2023-03-07 ENCOUNTER — Other Ambulatory Visit: Payer: Self-pay | Admitting: Medical

## 2023-03-07 NOTE — Telephone Encounter (Signed)
Pt has follow up 03/20/23

## 2023-03-09 NOTE — Addendum Note (Signed)
Addended by: Gwenevere Abbot on: 03/09/2023 01:31 PM   Modules accepted: Orders

## 2023-03-09 NOTE — Telephone Encounter (Signed)
Just refilled pt vit D weekly rx when is her follow up. Want to recheck her vit d level when in. Is she scheduled to see me within next 2 months.

## 2023-03-09 NOTE — Telephone Encounter (Signed)
Follow up is 03/20/23 at 2:00 pm , 40 min slot

## 2023-03-12 ENCOUNTER — Ambulatory Visit (INDEPENDENT_AMBULATORY_CARE_PROVIDER_SITE_OTHER): Payer: 59 | Admitting: Pulmonary Disease

## 2023-03-12 ENCOUNTER — Encounter: Payer: Self-pay | Admitting: Pulmonary Disease

## 2023-03-12 VITALS — BP 116/72 | HR 76 | Temp 97.9°F | Wt 174.0 lb

## 2023-03-12 DIAGNOSIS — J4489 Other specified chronic obstructive pulmonary disease: Secondary | ICD-10-CM | POA: Diagnosis not present

## 2023-03-12 DIAGNOSIS — J439 Emphysema, unspecified: Secondary | ICD-10-CM

## 2023-03-12 MED ORDER — BREZTRI AEROSPHERE 160-9-4.8 MCG/ACT IN AERO: 2.0000 | INHALATION_SPRAY | Freq: Two times a day (BID) | RESPIRATORY_TRACT | 5 refills | Status: DC

## 2023-03-12 NOTE — Progress Notes (Signed)
Jill CampbellKaren Y Spence    [id number]    1951/03/20  Primary Care Physician:Saguier, Kateri McEdward, PA-C  Referring Physician: Esperanza RichtersSaguier, Edward, PA-C [address] [place],  [place] [id number]  Chief complaint: Follow-up for COPD  HPI: 72 y.o. with history of COPD on supplemental oxygen, lung nodules.   Maintained on Breo and now Trelegy inhaler.  She has had recurrent exacerbations over the past year requiring prednisone, antibiotics. She is also being followed with serial CT scans for lung nodule  States that she was hospitalized for COVID-19 at [facility] in early 2021 but I do not have any record of those. Diagnosed with multinodular goiter and hypothyroidism in 2022.  She is following with endocrinology and is on methimazole  2022 She had a CT chest and PET scan for evaluation of lung nodules PET scan showed hypermetabolic them in the right axilla.  She has been seen at dermatology and this lesion excised.  I have reviewed the previous notes from dermatology As per the patient and the final biopsy report from September 15 is benign keloid  She also had a follow-up CT chest which showed improvement in lung nodule  Pets: Has a dog Occupation: Retired Midwifebus driver Exposures: Reports mold in the previous apartment.  No current mold exposure.  No hot tub, Jacuzzi Smoking history: States that she quit smoking in 2008.  Cannot give me a clear idea of how much she smoked prior Travel history: No significant travel history Relevant family history: No significant family history of lung disease  Interim history: Complains of worsening dyspnea on exertion with cough and mucus production. Chief complaint today is increasing mucus production which is yellow in color and feeling of choking in the throat  She had been given multiple rounds of Z-Pak and prednisone with little improvement in symptoms.  Outpatient Encounter Medications as of 03/12/2023  Medication Sig    albuterol (PROVENTIL HFA;VENTOLIN HFA) 108 (90 BASE) MCG/ACT inhaler Inhale 2 puffs into the lungs every 6 (six) hours as needed for wheezing.   albuterol (PROVENTIL) (2.5 MG/3ML) 0.083% nebulizer solution Take 2.5 mg by nebulization every 6 (six) hours as needed for wheezing.   alendronate (FOSAMAX) 70 MG tablet Take 70 mg by mouth every Sunday. Take with a full glass of water on an empty stomach.   ARIPiprazole (ABILIFY) 15 MG tablet Take 15 mg by mouth daily.   azithromycin (ZITHROMAX) 250 MG tablet Take as directed   B Complex Vitamins (VITAMIN B COMPLEX) TABS Take 1 tablet by mouth daily.   benzonatate (TESSALON) 100 MG capsule Take 1 capsule (100 mg total) by mouth 3 (three) times daily as needed for cough.   benzonatate (TESSALON) 200 MG capsule Take 200 mg by mouth 3 (three) times daily as needed.   benztropine (COGENTIN) 0.5 MG tablet Take 0.5 mg by mouth 2 (two) times daily.   cetirizine (ZYRTEC) 10 MG tablet TAKE ONE TABLET BY MOUTH EVERY DAY FOR ALLERGIES   cromolyn (OPTICROM) 4 % ophthalmic solution Place 1 drop into both eyes 4 times daily.   diclofenac Sodium (VOLTAREN ARTHRITIS PAIN) 1 % GEL Apply 4 g topically 4 (four) times daily.   doxycycline (ADOXA) 100 MG tablet Take 100 mg by mouth 2 (two) times daily.   famciclovir (FAMVIR) 250 MG tablet Take 250 mg by mouth 2 (two) times daily.   Fluticasone-Umeclidin-Vilant (TRELEGY ELLIPTA) 100-62.5-25 MCG/ACT AEPB Inhale 1 puff into the lungs daily.  gabapentin (NEURONTIN) 400 MG capsule Take 1,200 mg by mouth 3 (three) times daily.   hydrOXYzine (ATARAX) 10 MG tablet 1-2 tab q hs prn anxiety or insomnia   ibuprofen (ADVIL) 800 MG tablet Take 1 tablet (800 mg total) by mouth every 8 (eight) hours as needed for moderate pain.   ipratropium-albuterol (DUONEB) 0.5-2.5 (3) MG/3ML SOLN Take 3 mLs by nebulization every 4 (four) hours as needed.   loperamide (IMODIUM) 2 MG capsule 1 tab every 6-8 hours as needed diarrhea   magnesium oxide  (MAG-OX) 400 MG tablet Take 250 mg by mouth daily.   methimazole (TAPAZOLE) 5 MG tablet Take 1 tablet (5 mg total) by mouth daily.   montelukast (SINGULAIR) 10 MG tablet Take 1 tablet (10 mg total) by mouth at bedtime.   oxcarbazepine (TRILEPTAL) 600 MG tablet Take 600 mg by mouth 2 (two) times daily.   oxyCODONE (ROXICODONE) 5 MG immediate release tablet Take 1 tablet (5 mg total) by mouth 3 (three) times daily as needed for severe pain.   pantoprazole (PROTONIX) 40 MG tablet Take 40 mg by mouth daily.   predniSONE (DELTASONE) 20 MG tablet Take 40mg  for 5 days   ranitidine (ZANTAC) 300 MG capsule Take 300 mg by mouth every evening.   traZODone (DESYREL) 50 MG tablet Take 50 mg by mouth at bedtime.   Vitamin D, Ergocalciferol, (DRISDOL) 1.25 MG (50000 UNIT) CAPS capsule TAKE ONE CAPSULE BY MOUTH ONCE WEEKLY FOR SIX WEEKS   potassium chloride SA (KLOR-CON) 20 MEQ tablet Take 1 tablet (20 mEq total) by mouth 2 (two) times daily for 7 days.   [DISCONTINUED] Fluticasone-Umeclidin-Vilant (TRELEGY ELLIPTA) 100-62.5-25 MCG/INH AEPB Inhale into the lungs.   No facility-administered encounter medications on file as of 03/12/2023.   Physical Exam: Blood pressure 116/72, pulse 76, temperature 97.9 F (36.6 C), temperature source Oral, weight 174 lb (78.9 kg), SpO2 96 %. Gen:      No acute distress HEENT:  EOMI, sclera anicteric Neck:     No masses; no thyromegaly Lungs:    Clear to auscultation bilaterally; normal respiratory effort CV:         Regular rate and rhythm; no murmurs Abd:      + bowel sounds; soft, non-tender; no palpable masses, no distension Ext:    No edema; adequate peripheral perfusion Skin:      Warm and dry; no rash Neuro: alert and oriented x 3 Psych: normal mood and affect   Data Reviewed: Imaging: CT chest 03/29/2020-cirrhosis, thyroid nodule, severe emphysema  A new 4 mm nodule is seen along the minor fissure (3/61). 6 mm nodule in the lateral  segment right middle lobe  (3/69) has enlarged from 3 mm on  01/01/2019. 2 mm anterior right lower lobe nodule (3/76) is new.  Scarring in the apex of the left upper lobe with a new nodular  component measuring 6 mm (3/12). 3 mm lingular nodule (3/63) is new.  Additional tiny pulmonary nodules in the left lung are stable. No  pleural fluid. Airway is unremarkable.   CT chest 07/14/2020-new 5 mm nodule in the right middle lobe.  Remainder of the lung nodules are stable.  Enlarged pulmonary trunk  CT chest 10/20/2020-stable right middle lobe nodule, thyroid nodule, aortic atherosclerosis, emphysema  CT chest 04/14/2021-interval development of pulmonary nodules  PET scan 04/29/2021-12 mm lesion along the left anterior shoulder/axilla.  Multiple bilateral nodules up to 10 mm.  Super D CT 08/02/2021-recently noted pulm nodules are stable to decreased in  size.  CT chest 04/10/2022-stable pulm nodules, severe emphysema, coronary artery disease I have reviewed the images personally.  PFTs: 01/26/2021 FVC 1.68 [73%], FEV1 0.72 [40%], TLC 6.19 [126%], DLCO 5.09 [26%] Severe obstruction with air trapping and hyperinflation, severe diffusion defect  Labs: CBC 07/27/2020-WBC 7.3, eos 1%, absolute eosinophil count 73 IgE 07/27/2020-5  Alpha-1 antitrypsin 08/28/2028-170, PIMM  Assessment:  Severe COPD with exacerbations Has worsening dyspnea with chest congestion.  She feels that her current treatment regimen is not working for her Will change Trelegy to [employer] Continue duo nebs Mucinex, flutter valve for mucociliary clearance, add percussion vest She is continuing with physical therapy.  Declined pulmonary rehab.  Get echocardiogram and PFTs to evaluate dyspnea on exertion.  Pulmonary nodules Stable on CT scan in May 2023. Follow up is ordered for next month.  Will review it to see if there are any changes  Abnormal PET scan PET scan in 2022 showed hypermetabolic lesion along the left shoulder/axilla.  She had a skin  lesion which was excised by dermatology and is reportedly a benign keloid.   Plan/Recommendations: Start Breztri instead of Trelegy Mucociliary clearance with Mucinex, flutter valve, percussion vest Echocardiogram Follow-up CT, PFTs  Chilton Greathouse MD [facility] 03/12/2023, 1:41 PM  CC: Esperanza Richters, PA-C

## 2023-03-12 NOTE — Patient Instructions (Signed)
Will stop the Trelegy and start you on Breztri inhaler Continue the Mucinex and flutter valve Will order percussion vest Will also order echocardiogram to evaluate shortness of breath Follow-up in 3 months

## 2023-03-19 ENCOUNTER — Other Ambulatory Visit: Payer: Self-pay | Admitting: Medical

## 2023-03-20 ENCOUNTER — Ambulatory Visit (HOSPITAL_BASED_OUTPATIENT_CLINIC_OR_DEPARTMENT_OTHER)
Admission: RE | Admit: 2023-03-20 | Discharge: 2023-03-20 | Disposition: A | Discharge status: Home / Self Care | Payer: 59 | Source: Ambulatory Visit | Attending: Medical | Admitting: Medical

## 2023-03-20 ENCOUNTER — Encounter: Payer: Self-pay | Admitting: Medical

## 2023-03-20 ENCOUNTER — Ambulatory Visit (INDEPENDENT_AMBULATORY_CARE_PROVIDER_SITE_OTHER): Payer: 59 | Admitting: Medical

## 2023-03-20 VITALS — BP 130/80 | HR 73 | Resp 18 | Ht 63.5 in | Wt 172.0 lb

## 2023-03-20 DIAGNOSIS — J449 Chronic obstructive pulmonary disease, unspecified: Secondary | ICD-10-CM

## 2023-03-20 DIAGNOSIS — R030 Elevated blood-pressure reading, without diagnosis of hypertension: Secondary | ICD-10-CM | POA: Diagnosis not present

## 2023-03-20 DIAGNOSIS — G610 Guillain-Barre syndrome: Secondary | ICD-10-CM

## 2023-03-20 DIAGNOSIS — J301 Allergic rhinitis due to pollen: Secondary | ICD-10-CM | POA: Diagnosis not present

## 2023-03-20 DIAGNOSIS — J40 Bronchitis, not specified as acute or chronic: Secondary | ICD-10-CM

## 2023-03-20 DIAGNOSIS — K140 Glossitis: Secondary | ICD-10-CM

## 2023-03-20 DIAGNOSIS — R059 Cough, unspecified: Secondary | ICD-10-CM | POA: Diagnosis present

## 2023-03-20 DIAGNOSIS — K580 Irritable bowel syndrome with diarrhea: Secondary | ICD-10-CM

## 2023-03-20 MED ORDER — AZITHROMYCIN 250 MG PO TABS: ORAL_TABLET | ORAL | 0 refills | Status: DC

## 2023-03-20 MED ORDER — OLOPATADINE HCL 0.1 % OP SOLN: 1.0000 [drp] | Freq: Two times a day (BID) | OPHTHALMIC | 3 refills | Status: DC

## 2023-03-20 MED ORDER — LEVOCETIRIZINE DIHYDROCHLORIDE 5 MG PO TABS: 5.0000 mg | ORAL_TABLET | Freq: Every evening | ORAL | 3 refills | Status: DC

## 2023-03-20 NOTE — Addendum Note (Signed)
Addended by: Gwenevere Abbot on: 03/20/2023 08:05 PM   Modules accepted: Orders

## 2023-03-20 NOTE — Patient Instructions (Addendum)
1. Guillain Barr syndrome )- polarthralgia, chronic pain and chronic pain syndrome continue with physical medicine.  2. Stage 3 severe COPD by GOLD classification Breztri instead of Trelegy Mucociliary clearance with Mucinex, flutter valve, percussion vest Echocardiogram Follow-up CT, PFTs  3. Elevated blood pressure reading Bp better on recheck.   4. Seasonal allergic rhinitis due to pollen with some conjucntivitis Xyzal rx and stop zyrtec. Patanol as well. Stop cromylyn drops.  5. Irritable bowel syndrome with diarrhea  Referred to gi MD. Continue loperamide and bentyl.Diarrhea is now improved after she ate a lot healthier.  Pt states  gi office never called her. Number of gi office is [phone].  For hx of esphageal stenosis and some dypshagia rx'd ensure clear.   For bronchitis type symptoms rx azithromycin.    Offered/recommend referral to ENT  but you declined. Did order b12 and b1 for glossitis.  Fill out forms. Copy given to pt. CMA to fax  Follow up in one month or sooner if needed.

## 2023-03-20 NOTE — Progress Notes (Signed)
Subjective:    Patient ID: Jill Spence, female    DOB: Oct 22, 1951, 72 y.o.   MRN: [id number]  HPI  Pt in for follow up.  Last AVS note.  "I placed new referral though she already has appointment. Sent to [facility] [address], [place], [place] [address] Phone: [phone]     Do want you to follow up with current pulmonlogist as last notes states folow up in 3 months.   Diarrhea is now improved after she ate a lot healthier.    Guillain Barr syndrome (HCC)- polarthralgia, chronic pain and chronic pain syndrome continue with physical medicine.   Gastroesophageal reflux disease without esophagitis- continue protonix.      Irritable bowel syndrome with diarrhea Refer to gi MD. Continue loperamide and bentyl.Diarrhea is now improved after she ate a lot healthier.  Pt states  gi office never called her.   For arthralgia- rx'd ibuprophen. Dc meloxicam at your request.   Vit d refill today.   Elevated bp today. Will ask to check bp daily and notify me Thursday morning of bp level.  May need to rx bp med. Advise over next 3 days try not use any ibuprofen since can increase bp."   Bp at home 120/83. Better at home consistently.   Pt updates me that her allergies bad right now. Eyes burning, watery some itching and nasal congested. Some recent chest congestion and cough up some mucus. Symptoms flared past 2 weeks.  Left side of tongue small red bump not tender. Smoked years ago. Stopped 20 years. States in past light smoker pack a day would last one week. Smoked for 10 years.   Pt never got called by GI MD office. Referred to [facility] for ibs with diarrhea.  Pt did see pulmonologist Dr. Caren Griffins. Pt states evaluation be doing.    Assessment:   Severe COPD with exacerbations Has worsening dyspnea with chest congestion.  She feels that her current treatment regimen is not working for her Will change Trelegy to [employer] Continue duo nebs Mucinex, flutter valve for  mucociliary clearance, add percussion vest She is continuing with physical therapy.  Declined pulmonary rehab.   Get echocardiogram and PFTs to evaluate dyspnea on exertion.   Pulmonary nodules Stable on CT scan in May 2023. Follow up is ordered for next month.  Will review it to see if there are any changes   Abnormal PET scan PET scan in 2022 showed hypermetabolic lesion along the left shoulder/axilla.  She had a skin lesion which was excised by dermatology and is reportedly a benign keloid.    Plan/Recommendations: Start Breztri instead of Trelegy Mucociliary clearance with Mucinex, flutter valve, percussion vest Echocardiogram Follow-up CT, PFTs   Pt request ensure clear since some difficulty swallowing solids and esophageal stenosis.   Some recent productive cough for past one. Pt states bringing up thick mucus at times.    Review of Systems  Constitutional:  Negative for chills, fatigue and fever.  HENT:  Negative for congestion and dental problem.   Respiratory:  Positive for cough. Negative for chest tightness, shortness of breath and wheezing.   Cardiovascular:  Negative for chest pain and palpitations.  Gastrointestinal:  Negative for abdominal pain, anal bleeding, blood in stool and diarrhea.  Musculoskeletal:  Negative for back pain, joint swelling and myalgias.  Skin:  Negative for rash.  Neurological:  Negative for dizziness, seizures and headaches.  Hematological:  Negative for adenopathy. Does not bruise/bleed easily.  Psychiatric/Behavioral:  Negative for behavioral problems and dysphoric mood.     Past Medical History:  Diagnosis Date   Anemia    low iron   Anxiety    Arthritis    Bipolar disorder    COPD (chronic obstructive pulmonary disease)    Family history of adverse reaction to anesthesia    mom "was put to sleep and she never woke up" -    GERD (gastroesophageal reflux disease)    Guillain Barr syndrome    numbness in toes, legs hurt    Headache    migraines as a teenager   IBS (irritable bowel syndrome)    Lump, breast    removed years ago per Pt.   Pneumonia    Restless leg syndrome    Seizures    only has one when she gets upset, has "quiet' seizures     Social History   Socioeconomic History   Marital status: Widowed    Spouse name: Not on file   Number of children: Not on file   Years of education: Not on file   Highest education level: Not on file  Occupational History   Not on file  Tobacco Use   Smoking status: Former    Packs/day: 0.25    Years: 6.00    Additional pack years: 0.00    Total pack years: 1.50    Types: Cigarettes    Quit date: 10/14/2007    Years since quitting: 15.4   Smokeless tobacco: Never  Vaping Use   Vaping Use: Never used  Substance and Sexual Activity   Alcohol use: No   Drug use: No   Sexual activity: Not on file  Other Topics Concern   Not on file  Social History Narrative   Not on file   Social Determinants of Health   Financial Resource Strain: Not on file  Food Insecurity: Not on file  Transportation Needs: Not on file  Physical Activity: Not on file  Stress: Not on file  Social Connections: Not on file  Intimate Partner Violence: Not on file    Past Surgical History:  Procedure Laterality Date   BREAST SURGERY     COLONOSCOPY     EYE SURGERY Bilateral    cataract surgery with lens implants   MANDIBLE RECONSTRUCTION     MINOR REMOVAL OF MANDIBULAR HARDWARE Right 10/15/2015   Procedure: MINOR REMOVAL OF Right MANDIBULAR HARDWARE;  Surgeon: Ocie Doyne, DDS;  Location: [facility] OR;  Service: Oral Surgery;  Laterality: Right;   TONSILLECTOMY      Family History  Problem Relation Age of Onset   Cancer Father     Allergies  Allergen Reactions   Aspirin    Influenza Vaccines     Current Outpatient Medications on File Prior to Visit  Medication Sig Dispense Refill   albuterol (PROVENTIL HFA;VENTOLIN HFA) 108 (90 BASE) MCG/ACT inhaler Inhale 2  puffs into the lungs every 6 (six) hours as needed for wheezing.     albuterol (PROVENTIL) (2.5 MG/3ML) 0.083% nebulizer solution Take 2.5 mg by nebulization every 6 (six) hours as needed for wheezing.     alendronate (FOSAMAX) 70 MG tablet Take 70 mg by mouth every Sunday. Take with a full glass of water on an empty stomach.     ARIPiprazole (ABILIFY) 15 MG tablet Take 15 mg by mouth daily.     azithromycin (ZITHROMAX) 250 MG tablet Take as directed 6 tablet 0   B Complex Vitamins (VITAMIN B COMPLEX) TABS  Take 1 tablet by mouth daily. 30 tablet 5   benzonatate (TESSALON) 100 MG capsule Take 1 capsule (100 mg total) by mouth 3 (three) times daily as needed for cough. 30 capsule 1   benzonatate (TESSALON) 200 MG capsule Take 200 mg by mouth 3 (three) times daily as needed.     benztropine (COGENTIN) 0.5 MG tablet Take 0.5 mg by mouth 2 (two) times daily.     Budeson-Glycopyrrol-Formoterol (BREZTRI AEROSPHERE) 160-9-4.8 MCG/ACT AERO Inhale 2 puffs into the lungs in the morning and at bedtime. 10.7 g 5   diclofenac Sodium (VOLTAREN ARTHRITIS PAIN) 1 % GEL Apply 4 g topically 4 (four) times daily. 150 g 3   famciclovir (FAMVIR) 250 MG tablet Take 250 mg by mouth 2 (two) times daily.     gabapentin (NEURONTIN) 400 MG capsule Take 1,200 mg by mouth 3 (three) times daily.     hydrOXYzine (ATARAX) 10 MG tablet Take 1-2 tablets (10-20 mg total) by mouth at bedtime as needed for anxiety (insomnia). 30 tablet 1   ibuprofen (ADVIL) 800 MG tablet Take 1 tablet (800 mg total) by mouth every 8 (eight) hours as needed for moderate pain. 30 tablet 2   ipratropium-albuterol (DUONEB) 0.5-2.5 (3) MG/3ML SOLN Take 3 mLs by nebulization every 4 (four) hours as needed. 360 mL 5   loperamide (IMODIUM) 2 MG capsule 1 tab every 6-8 hours as needed diarrhea 30 capsule 1   magnesium oxide (MAG-OX) 400 MG tablet Take 250 mg by mouth daily.     methimazole (TAPAZOLE) 5 MG tablet Take 1 tablet (5 mg total) by mouth daily. 90  tablet 3   montelukast (SINGULAIR) 10 MG tablet Take 1 tablet (10 mg total) by mouth at bedtime. 90 tablet 3   oxcarbazepine (TRILEPTAL) 600 MG tablet Take 600 mg by mouth 2 (two) times daily.     oxyCODONE (ROXICODONE) 5 MG immediate release tablet Take 1 tablet (5 mg total) by mouth 3 (three) times daily as needed for severe pain. 90 tablet 0   pantoprazole (PROTONIX) 40 MG tablet Take 40 mg by mouth daily.     potassium chloride SA (KLOR-CON) 20 MEQ tablet Take 1 tablet (20 mEq total) by mouth 2 (two) times daily for 7 days. 14 tablet 0   predniSONE (DELTASONE) 20 MG tablet Take 40mg  for 5 days 10 tablet 0   ranitidine (ZANTAC) 300 MG capsule Take 300 mg by mouth every evening.     traZODone (DESYREL) 50 MG tablet Take 50 mg by mouth at bedtime.     Vitamin D, Ergocalciferol, (DRISDOL) 1.25 MG (50000 UNIT) CAPS capsule TAKE ONE CAPSULE BY MOUTH ONCE WEEKLY FOR SIX WEEKS 6 capsule 0   No current facility-administered medications on file prior to visit.    BP 130/80   Pulse 73   Resp 18   Ht 5' 3.5" (1.613 m)   Wt 172 lb (78 kg)   SpO2 100%   BMI 29.99 kg/m        Objective:   Physical Exam   General Mental Status- Alert. General Appearance- Not in acute distress.   Skin General: Color- Normal Color. Moisture- Normal Moisture.  Neck Carotid Arteries- Normal color. Moisture- Normal Moisture. No carotid bruits. No JVD.  Chest and Lung Exam Auscultation: Breath Sounds:-shallow even and unabored. Mild rough breath sounds at bases  Cardiovascular Auscultation:Rythm- Regular. Murmurs & Other Heart Sounds:Auscultation of the heart reveals- No Murmurs.  Abdomen Inspection:-Inspeection Normal. Palpation/Percussion:Note:No mass. Palpation and Percussion of the  abdomen reveal- Non Tender, Non Distended + BS, no rebound or guarding.   Neurologic Cranial Nerve exam:- CN III-XII intact(No nystagmus), symmetric smile. Strength:- 5/5 equal and symmetric strength both upper and  lower extremities.    Heent- tongue some glossitis. Left side small discolored pin pint ara looks bruise color. Left side near tip of tongue    Assessment & Plan:   Patient Instructions  1. Guillain Barr syndrome )- polarthralgia, chronic pain and chronic pain syndrome continue with physical medicine.  2. Stage 3 severe COPD by GOLD classification Breztri instead of Trelegy Mucociliary clearance with Mucinex, flutter valve, percussion vest Echocardiogram Follow-up CT, PFTs  3. Elevated blood pressure reading Bp better on recheck.   4. Seasonal allergic rhinitis due to pollen with some conjucntivitis Xyzal rx and stop zyrtec. Patanol as well. Stop cromylyn drops.  5. Irritable bowel syndrome with diarrhea  Referred to gi MD. Continue loperamide and bentyl.Diarrhea is now improved after she ate a lot healthier.  Pt states  gi office never called her. Number of gi office is [phone].  For hx of esphageal stenosis and some dypshagia rx'd ensure clear.   For bronchitis type symptoms rx azithromycin.  Offered/recommend referral to ENT  but you declined. Did order b12 and b1 for glossitis.  Fill out forms   Follow up in one month or sooner if needed.      Esperanza Richters, PA-C    Time spent with patient today was 55  minutes which consisted of chart revdiew, discussing diagnosis, work up treatment and documentation.

## 2023-03-21 ENCOUNTER — Telehealth: Payer: Self-pay | Admitting: Medical

## 2023-03-21 LAB — VITAMIN B12: Vitamin B-12: 352 pg/mL (ref 211–911)

## 2023-03-21 NOTE — Telephone Encounter (Signed)
Pt stated the ensure clear needs to be sent to [facility] instead.  She needs the order faxed to: [phone]  Please advice.

## 2023-03-22 ENCOUNTER — Other Ambulatory Visit: Payer: Self-pay

## 2023-03-22 MED ORDER — AZITHROMYCIN 250 MG PO TABS: ORAL_TABLET | ORAL | 0 refills | Status: DC

## 2023-03-22 MED ORDER — AZITHROMYCIN 250 MG PO TABS: ORAL_TABLET | ORAL | 0 refills | Status: AC

## 2023-03-22 NOTE — Telephone Encounter (Signed)
Faxed and copy sent to scan.

## 2023-03-22 NOTE — Telephone Encounter (Signed)
Feliberto Gottron called for a refill of Oxycodone 5 MG. Please send to [facility].    Patient  will call back later today with her pill count after her nurse arrives. She stated she cannot count the tiny pills. She is calling early for a refill so she will not forget or run out of medication.

## 2023-03-22 NOTE — Telephone Encounter (Signed)
Medication list was reviewed.  Prescription should be at the pharmacy already.  Call placed to Ms. Lacks regarding the above, she verbalizes understanding.

## 2023-03-22 NOTE — Addendum Note (Signed)
Addended by: Thelma Barge D on: 03/22/2023 02:48 PM   Modules accepted: Orders

## 2023-03-23 ENCOUNTER — Ambulatory Visit: Payer: 59 | Admitting: Medical

## 2023-03-23 LAB — VITAMIN B1: Vitamin B1 (Thiamine): 17 nmol/L (ref 8–30)

## 2023-03-26 NOTE — Progress Notes (Unsigned)
Pt did not answer phone call for [facility].

## 2023-04-02 ENCOUNTER — Encounter: Payer: Self-pay | Admitting: Medical

## 2023-04-02 ENCOUNTER — Ambulatory Visit (INDEPENDENT_AMBULATORY_CARE_PROVIDER_SITE_OTHER): Payer: 59 | Admitting: Medical

## 2023-04-02 ENCOUNTER — Telehealth: Payer: Self-pay

## 2023-04-02 ENCOUNTER — Ambulatory Visit (HOSPITAL_BASED_OUTPATIENT_CLINIC_OR_DEPARTMENT_OTHER)
Admission: RE | Admit: 2023-04-02 | Discharge: 2023-04-02 | Disposition: A | Discharge status: Home / Self Care | Payer: 59 | Source: Ambulatory Visit | Attending: Medical | Admitting: Medical

## 2023-04-02 VITALS — BP 140/88 | HR 86 | Resp 18 | Ht 63.5 in | Wt 173.0 lb

## 2023-04-02 DIAGNOSIS — R269 Unspecified abnormalities of gait and mobility: Secondary | ICD-10-CM

## 2023-04-02 DIAGNOSIS — G8929 Other chronic pain: Secondary | ICD-10-CM

## 2023-04-02 DIAGNOSIS — J449 Chronic obstructive pulmonary disease, unspecified: Secondary | ICD-10-CM | POA: Diagnosis not present

## 2023-04-02 DIAGNOSIS — J189 Pneumonia, unspecified organism: Secondary | ICD-10-CM | POA: Diagnosis not present

## 2023-04-02 DIAGNOSIS — D72829 Elevated white blood cell count, unspecified: Secondary | ICD-10-CM

## 2023-04-02 DIAGNOSIS — J309 Allergic rhinitis, unspecified: Secondary | ICD-10-CM

## 2023-04-02 LAB — CBC WITH DIFFERENTIAL/PLATELET
Basophils Absolute: 0.1 10*3/uL (ref 0.0–0.1)
Basophils Relative: 0.5 % (ref 0.0–3.0)
Eosinophils Absolute: 0 10*3/uL (ref 0.0–0.7)
Eosinophils Relative: 0.1 % (ref 0.0–5.0)
HCT: 43.1 % (ref 36.0–46.0)
Hemoglobin: 14.2 g/dL (ref 12.0–15.0)
Lymphocytes Relative: 18.4 % (ref 12.0–46.0)
Lymphs Abs: 2.5 10*3/uL (ref 0.7–4.0)
MCHC: 32.9 g/dL (ref 30.0–36.0)
MCV: 96.1 fl (ref 78.0–100.0)
Monocytes Absolute: 0.6 10*3/uL (ref 0.1–1.0)
Monocytes Relative: 4.6 % (ref 3.0–12.0)
Neutro Abs: 10.6 10*3/uL — ABNORMAL HIGH (ref 1.4–7.7)
Neutrophils Relative %: 76.4 % (ref 43.0–77.0)
Platelets: 329 10*3/uL (ref 150.0–400.0)
RBC: 4.49 Mil/uL (ref 3.87–5.11)
RDW: 14.8 % (ref 11.5–15.5)
WBC: 13.8 10*3/uL — ABNORMAL HIGH (ref 4.0–10.5)

## 2023-04-02 MED ORDER — DICLOFENAC SODIUM 1 % EX GEL: 4.0000 g | Freq: Four times a day (QID) | CUTANEOUS | 3 refills | Status: DC

## 2023-04-02 MED ORDER — AZITHROMYCIN 250 MG PO TABS: ORAL_TABLET | ORAL | 0 refills | Status: DC

## 2023-04-02 MED ORDER — TRELEGY ELLIPTA 100-62.5-25 MCG/ACT IN AEPB: 1.0000 | INHALATION_SPRAY | Freq: Every day | RESPIRATORY_TRACT | 11 refills | Status: DC

## 2023-04-02 NOTE — Telephone Encounter (Signed)
Orders for oxygen supplies and walker placed , community message sent to [facility]

## 2023-04-02 NOTE — Addendum Note (Signed)
Addended by: Gwenevere Abbot on: 04/02/2023 02:16 PM   Modules accepted: Orders

## 2023-04-02 NOTE — Patient Instructions (Addendum)
Abnormality of gait and mobility - For home use only DME 4 wheeled rolling walker with seat ([id number])   Stage 3 severe COPD by GOLD classification (HCC) Restart trelegy as you report that works much better than [employer]. Ask you send note to your pulmonologist to update there office - For home use only DME oxygen   Pneumonia of right middle lobe due to infectious organism Round of aithromycin taken. Clnicially doing better but with lung history will repeat cxr and follow cbc. Determine if another round of antibiotics indicated.  - DG Chest 2 View; Future - CBC with Differential/Platelet   Follow up date to be determined after lab and cxr review.

## 2023-04-02 NOTE — Addendum Note (Signed)
Addended by: Gwenevere Abbot on: 04/02/2023 09:10 PM   Modules accepted: Orders

## 2023-04-02 NOTE — Progress Notes (Signed)
Subjective:    Patient ID: Jill Spence, female    DOB: Jan 21, 1951, 72 y.o.   MRN: [id number]  HPI Pt in for follow up.   Pt has some recent sore throat and nasal congestion. Pt notes she is very sensitive to the pollen.   Pt states for weeks she was feeling weak and intermittent chills. She did take azithromycin. Pt states feels better. Has more energy with antibiotic. She has improved appetitie but still coughing up some mucus.  Pt notes with other antibiotics she would have a lot of gi side effect and diarrhea. With azithromycin had no side effects.   Pt has copd. She states she feels trelegy works a lot better than [employer]. Pt states she used breztri only briefly and then started back on trelegy.        Review of Systems  Constitutional:  Negative for chills, fatigue and fever.  HENT:  Negative for congestion and ear pain.   Respiratory:  Positive for cough and wheezing. Negative for chest tightness and shortness of breath.        Productive cough.  Cardiovascular:  Negative for chest pain and palpitations.  Gastrointestinal:  Negative for abdominal pain.  Genitourinary:  Negative for dysuria.  Musculoskeletal:  Negative for back pain.  Skin:  Negative for pallor and rash.  Neurological:  Negative for dizziness, numbness and headaches.  Hematological:  Negative for adenopathy. Does not bruise/bleed easily.  Psychiatric/Behavioral:  Negative for behavioral problems, decreased concentration and dysphoric mood.     Past Medical History:  Diagnosis Date   Anemia    low iron   Anxiety    Arthritis    Bipolar disorder (HCC)    COPD (chronic obstructive pulmonary disease) (HCC)    Family history of adverse reaction to anesthesia    mom "was put to sleep and she never woke up" -    GERD (gastroesophageal reflux disease)    Guillain Barr syndrome (HCC)    numbness in toes, legs hurt   Headache    migraines as a teenager   IBS (irritable bowel syndrome)    Lump,  breast    removed years ago per Pt.   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 gets upset, has "quiet' seizures     Social History   Socioeconomic History   Marital status: Widowed    Spouse name: Not on file   Number of children: Not on file   Years of education: Not on file   Highest education level: Some college, no degree  Occupational History   Not on file  Tobacco Use   Smoking status: Former    Packs/day: 0.25    Years: 6.00    Additional pack years: 0.00    Total pack years: 1.50    Types: Cigarettes    Quit date: 10/14/2007    Years since quitting: 15.4   Smokeless tobacco: Never  Vaping Use   Vaping Use: Never used  Substance and Sexual Activity   Alcohol use: No   Drug use: No   Sexual activity: Not on file  Other Topics Concern   Not on file  Social History Narrative   Not on file   Social Determinants of Health   Financial Resource Strain: Medium Risk (03/29/2023)   Overall Financial Resource Strain (CARDIA)    Difficulty of Paying Living Expenses: Somewhat hard  Food Insecurity: Food Insecurity Present (03/29/2023)  Hunger Vital Sign    Worried About Running Out of Food in the Last Year: Never true    Ran Out of Food in the Last Year: Often true  Transportation Needs: No Transportation Needs (03/29/2023)   PRAPARE - Administrator, Civil Service (Medical): No    Lack of Transportation (Non-Medical): No  Physical Activity: Insufficiently Active (03/29/2023)   Exercise Vital Sign    Days of Exercise per Week: 1 day    Minutes of Exercise per Session: 30 min  Stress: Stress Concern Present (03/29/2023)   [employer] of Occupational Health - Occupational Stress Questionnaire    Feeling of Stress : To some extent  Social Connections: Moderately Isolated (03/29/2023)   Social Connection and Isolation Panel [NHANES]    Frequency of Communication with Friends and Family: More than three times a week     Frequency of Social Gatherings with Friends and Family: Once a week    Attends Religious Services: 1 to 4 times per year    Active Member of [employer] or Organizations: No    Attends Banker Meetings: Never    Marital Status: Widowed  Catering manager Violence: Not on file    Past Surgical History:  Procedure Laterality Date   BREAST SURGERY     COLONOSCOPY     EYE SURGERY Bilateral    cataract surgery with lens implants   MANDIBLE RECONSTRUCTION     MINOR REMOVAL OF MANDIBULAR HARDWARE Right 10/15/2015   Procedure: MINOR REMOVAL OF Right MANDIBULAR HARDWARE;  Surgeon: Ocie Doyne, DDS;  Location: [facility] OR;  Service: Oral Surgery;  Laterality: Right;   TONSILLECTOMY      Family History  Problem Relation Age of Onset   Cancer Father     Allergies  Allergen Reactions   Aspirin    Influenza Vaccines     Current Outpatient Medications on File Prior to Visit  Medication Sig Dispense Refill   albuterol (PROVENTIL HFA;VENTOLIN HFA) 108 (90 BASE) MCG/ACT inhaler Inhale 2 puffs into the lungs every 6 (six) hours as needed for wheezing.     albuterol (PROVENTIL) (2.5 MG/3ML) 0.083% nebulizer solution Take 2.5 mg by nebulization every 6 (six) hours as needed for wheezing.     alendronate (FOSAMAX) 70 MG tablet Take 70 mg by mouth every Sunday. Take with a full glass of water on an empty stomach.     ARIPiprazole (ABILIFY) 15 MG tablet Take 15 mg by mouth daily.     B Complex Vitamins (VITAMIN B COMPLEX) TABS Take 1 tablet by mouth daily. 30 tablet 5   benzonatate (TESSALON) 100 MG capsule Take 1 capsule (100 mg total) by mouth 3 (three) times daily as needed for cough. 30 capsule 1   benztropine (COGENTIN) 0.5 MG tablet Take 0.5 mg by mouth 2 (two) times daily.     diclofenac Sodium (VOLTAREN ARTHRITIS PAIN) 1 % GEL Apply 4 g topically 4 (four) times daily. 150 g 3   famciclovir (FAMVIR) 250 MG tablet Take 250 mg by mouth 2 (two) times daily.     gabapentin (NEURONTIN) 400 MG  capsule Take 1,200 mg by mouth 3 (three) times daily.     hydrOXYzine (ATARAX) 10 MG tablet Take 1-2 tablets (10-20 mg total) by mouth at bedtime as needed for anxiety (insomnia). 30 tablet 1   ibuprofen (ADVIL) 800 MG tablet Take 1 tablet (800 mg total) by mouth every 8 (eight) hours as needed for moderate pain. 30 tablet 2  ipratropium-albuterol (DUONEB) 0.5-2.5 (3) MG/3ML SOLN Take 3 mLs by nebulization every 4 (four) hours as needed. 360 mL 5   levocetirizine (XYZAL) 5 MG tablet Take 1 tablet (5 mg total) by mouth every evening. 30 tablet 3   loperamide (IMODIUM) 2 MG capsule 1 tab every 6-8 hours as needed diarrhea 30 capsule 1   magnesium oxide (MAG-OX) 400 MG tablet Take 250 mg by mouth daily.     methimazole (TAPAZOLE) 5 MG tablet Take 1 tablet (5 mg total) by mouth daily. 90 tablet 3   montelukast (SINGULAIR) 10 MG tablet Take 1 tablet (10 mg total) by mouth at bedtime. 90 tablet 3   olopatadine (PATANOL) 0.1 % ophthalmic solution Place 1 drop into both eyes 2 (two) times daily. 5 mL 3   oxcarbazepine (TRILEPTAL) 600 MG tablet Take 600 mg by mouth 2 (two) times daily.     oxyCODONE (ROXICODONE) 5 MG immediate release tablet Take 1 tablet (5 mg total) by mouth 3 (three) times daily as needed for severe pain. 90 tablet 0   pantoprazole (PROTONIX) 40 MG tablet Take 40 mg by mouth daily.     potassium chloride SA (KLOR-CON) 20 MEQ tablet Take 1 tablet (20 mEq total) by mouth 2 (two) times daily for 7 days. 14 tablet 0   ranitidine (ZANTAC) 300 MG capsule Take 300 mg by mouth every evening.     traZODone (DESYREL) 50 MG tablet Take 50 mg by mouth at bedtime.     Vitamin D, Ergocalciferol, (DRISDOL) 1.25 MG (50000 UNIT) CAPS capsule TAKE ONE CAPSULE BY MOUTH ONCE WEEKLY FOR SIX WEEKS 6 capsule 0   No current facility-administered medications on file prior to visit.    BP (!) 140/88   Pulse 86   Resp 18   Ht 5' 3.5" (1.613 m)   Wt 173 lb (78.5 kg)   SpO2 95%   BMI 30.16 kg/m             Objective:   Physical Exam  General- No acute distress. Pleasant patient. Neck- Full range of motion, no jvd Lungs- Clear and unlabored. But shallow respirations. Heart- regular rate and rhythm. Neurologic- CNII- XII grossly intact.       Assessment & Plan:   Patient Instructions   Abnormality of gait and mobility - For home use only DME 4 wheeled rolling walker with seat ([id number])   Stage 3 severe COPD by GOLD classification (HCC) Restart trelegy as you report that works much better than [employer]. Ask you send note to your pulmonologist to update there office - For home use only DME oxygen   Pneumonia of right middle lobe due to infectious organism Round of aithromycin taken. Clnicially doing better but with lung history will repeat cxr and follow cbc. Determine if another round of antibiotics indicated.  - DG Chest 2 View; Future - CBC with Differential/Platelet   Follow up date to be determined after lab and cxr review.   Esperanza Richters, PA-C

## 2023-04-03 ENCOUNTER — Encounter: Payer: Self-pay | Admitting: Pulmonary Disease

## 2023-04-03 ENCOUNTER — Telehealth: Payer: Self-pay | Admitting: Medical

## 2023-04-03 MED ORDER — AZITHROMYCIN 250 MG PO TABS: ORAL_TABLET | ORAL | 0 refills | Status: AC

## 2023-04-03 NOTE — Telephone Encounter (Signed)
Rx sent ... Please advise on letter

## 2023-04-03 NOTE — Addendum Note (Signed)
Addended by: Maximino Sarin on: 04/03/2023 02:24 PM   Modules accepted: Orders

## 2023-04-03 NOTE — Telephone Encounter (Signed)
Software engineer (nurse DPR Ok) called stating pt's azithromycin needed to be rerouted to the following pharmacy:  [facility] [address], [place], [place] [address] P: [phone]  She stated pt also needed a letter written to her apartment complex regarding keeping her windows covered with plastic to ensure outdoor particle do not make it into her home.

## 2023-04-04 NOTE — Telephone Encounter (Signed)
I think that is big ask about windows. If what they put up is with tape/nails?? Can imagine plastic pealing off and falling. How many windows does she have etc?  I think it would be more reasonable for letter to state provide her with highest grade air filters every 3 months. Pass that message along to them and then will write letter.

## 2023-04-05 ENCOUNTER — Telehealth: Payer: Self-pay | Admitting: Pulmonary Disease

## 2023-04-05 NOTE — Telephone Encounter (Signed)
Patient is scheduled for a ct on 04/09/2023 I do not see any other ct's

## 2023-04-05 NOTE — Telephone Encounter (Signed)
PT has a CT sched but she had a recent CT done. Can we uses that one?  [phone] Joannie [place] or [place] These are Personal Care nurses for the PT.

## 2023-04-06 ENCOUNTER — Other Ambulatory Visit: Payer: Self-pay | Admitting: Medical

## 2023-04-06 NOTE — Telephone Encounter (Signed)
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

## 2023-04-06 NOTE — Telephone Encounter (Signed)
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

## 2023-04-06 NOTE — Telephone Encounter (Signed)
Message sent to [employer] to find out where we are on this order

## 2023-04-06 NOTE — Telephone Encounter (Signed)
Dr Isaiah Serge,  Lorain Childes regarding change from [facility] back to Trelegy (from PCP).  Patient MyChart note from 04/03/2023: Jill Spence  to [facility] Pool (supporting Chilton Greathouse, MD)     04/03/23  2:14 PM Did not like the new Inhaler prescribed. Used for a week and didn't not feel any positive effects. Primary Care recorded the previous inhaler Trilogy. Also would like to inquire when the vest will be delivered? Thank you

## 2023-04-06 NOTE — Telephone Encounter (Signed)
Hello!  Can you get an update for this patient regarding when she will get her pulmonary vest?  Patient sent a MyChart message.

## 2023-04-06 NOTE — Telephone Encounter (Signed)
Pt called and lvm to return call 

## 2023-04-09 ENCOUNTER — Ambulatory Visit (HOSPITAL_BASED_OUTPATIENT_CLINIC_OR_DEPARTMENT_OTHER): Payer: 59

## 2023-04-09 ENCOUNTER — Telehealth: Payer: Self-pay | Admitting: Medical

## 2023-04-09 MED ORDER — BENZONATATE 100 MG PO CAPS: 100.0000 mg | ORAL_CAPSULE | Freq: Three times a day (TID) | ORAL | 1 refills | Status: DC | PRN

## 2023-04-09 NOTE — Telephone Encounter (Signed)
Spoke with patient.  She has 3-4 windows (including one on her door).  She states that she puts plastic up now and it stays up pretty well.  She has a lot of trees around her apartment.  She keeps her apartment well dusted.  I asked about air filters and she stated that they do not change that often, advised that they should be changing at least once a month.  Also she would like refill on medication for muscle spasms.  She did not know name.

## 2023-04-09 NOTE — Telephone Encounter (Signed)
Patient would like a refill of her Tessalon pearls and also on her muscle spasms pills. She states she doesn't remember the name of the medication. Please advise.    [facility] [address] [place] - [address] [phone] [address], [place] [address] Phone: [phone]  Fax: [phone]

## 2023-04-09 NOTE — Addendum Note (Signed)
Addended by: Gwenevere Abbot on: 04/09/2023 07:17 PM   Modules accepted: Orders

## 2023-04-10 ENCOUNTER — Telehealth: Payer: Self-pay

## 2023-04-10 ENCOUNTER — Other Ambulatory Visit: Payer: Self-pay | Admitting: Medical

## 2023-04-10 NOTE — Telephone Encounter (Signed)
Pt would like another referral to pulmonology doesn't feel her current pulmonologist is doing their job and they dont answer the phone

## 2023-04-10 NOTE — Telephone Encounter (Signed)
Can you help with this pleaseee?

## 2023-04-10 NOTE — Telephone Encounter (Signed)
Patient called and would status on letter.  Thank you !

## 2023-04-10 NOTE — Telephone Encounter (Addendum)
Letter completed by PCP , placing up front  Pt called and notified

## 2023-04-10 NOTE — Telephone Encounter (Signed)
Spoke with pt, pt is aware and expressed understanding, pt also stated she would like a new referral placed to see a different pulmonologist. Due to the current one not be a good match with pt.

## 2023-04-10 NOTE — Addendum Note (Signed)
Addended by: Gwenevere Abbot on: 04/10/2023 04:56 PM   Modules accepted: Orders

## 2023-04-10 NOTE — Telephone Encounter (Signed)
Just thinking about this.  Can pulmonary write this for her?

## 2023-04-10 NOTE — Telephone Encounter (Signed)
Are you guys able to assist with a more detailed letter?

## 2023-04-11 ENCOUNTER — Ambulatory Visit (HOSPITAL_COMMUNITY): Payer: 59 | Attending: Cardiology

## 2023-04-11 ENCOUNTER — Telehealth: Payer: Self-pay | Admitting: Pulmonary Disease

## 2023-04-11 DIAGNOSIS — J4489 Other specified chronic obstructive pulmonary disease: Secondary | ICD-10-CM | POA: Diagnosis present

## 2023-04-11 DIAGNOSIS — J9611 Chronic respiratory failure with hypoxia: Secondary | ICD-10-CM | POA: Insufficient documentation

## 2023-04-11 DIAGNOSIS — R0600 Dyspnea, unspecified: Secondary | ICD-10-CM | POA: Insufficient documentation

## 2023-04-11 DIAGNOSIS — R0609 Other forms of dyspnea: Secondary | ICD-10-CM | POA: Diagnosis not present

## 2023-04-11 DIAGNOSIS — J449 Chronic obstructive pulmonary disease, unspecified: Secondary | ICD-10-CM

## 2023-04-11 DIAGNOSIS — J439 Emphysema, unspecified: Secondary | ICD-10-CM | POA: Diagnosis present

## 2023-04-11 LAB — ECHOCARDIOGRAM COMPLETE
Area-P 1/2: 2.48 cm2
S' Lateral: 2.2 cm

## 2023-04-11 NOTE — Telephone Encounter (Signed)
Spoke with pt, pt states she "never wants to go back to that pulmonologist" pt states she had 2 visits with the provider and pt was sent home told nothing was wrong, pt states she came to our office and PCP discovered she has pneumonia. Pt also sates she needs a letter from PCP stating she needs to keep the plastic up on her windows in her bedroom and living room.

## 2023-04-11 NOTE — Telephone Encounter (Signed)
Pts care taker presented to the front desk. She has several questions for Dr. Isaiah Serge.  States a "Lung Function Test" was supposed to be sched. I asked if she meant a PFT but she said it is sched at the hospital. No one has called the PT yet.  She needs a letter from Dr. Judie Petit that states it is OK at her apt building to have plastic around the windows because there is a large tree just outside the window and she is concerned with pollen & "micro organisms"  coming in thru the [facility] unit.  Please call w/Echo results (Done on 5/8)   She stopped Breztri and went baqck on to the Trelegy inhaler. Another Dr. Mee Hives the Trelegy. It caused dizziness.  "I'll be frank with you she is not happy with her care."   Also a vest was mentioned at the appt tp help w/ congestion. The PT thought it would go to the Pharmacy but it was sent to [facility] and has now expired. A new order needed to be put in because the first order expired   Care Taker's  # [phone] She prefers the call to advise on the status of these issues/questions. Very nice young woman who shows compassionate care.

## 2023-04-12 ENCOUNTER — Telehealth: Payer: Self-pay | Admitting: Medical

## 2023-04-12 NOTE — Telephone Encounter (Signed)
Pt's nurse dropped off more paperwork to be filled out. Placed in pcp's bin upfront.

## 2023-04-12 NOTE — Addendum Note (Signed)
Addended by: Gwenevere Abbot on: 04/12/2023 06:30 AM   Modules accepted: Orders

## 2023-04-13 NOTE — Telephone Encounter (Signed)
Spoke with pt,pt was advised pts sates it is way to "drafty" in her house and pt would like a letter to leave plastic up at least in the fall/winter. Pt believes she got the pneumonia from "breathing in cold air".

## 2023-04-13 NOTE — Telephone Encounter (Signed)
Paperwork picked up and placed in red provider folder

## 2023-04-16 ENCOUNTER — Telehealth: Payer: Self-pay | Admitting: Medical

## 2023-04-16 NOTE — Telephone Encounter (Signed)
Paperwork completed. And sent to patient's home address

## 2023-04-16 NOTE — Telephone Encounter (Signed)
Caregiver said that the patient has already done two rounds of antibiotics and they recently received an order from the mail pharmacy for azithromycin (ZITHROMAX) 250 MG tablet  Does patient need to take this as well or is this a duplicate order that was not canceled since pt has both a mail order and local pharmacy. Please call to advise

## 2023-04-17 NOTE — Telephone Encounter (Signed)
Pt has an appt 04/24/23

## 2023-04-17 NOTE — Telephone Encounter (Signed)
Please check on the status of her CT chest and PFTs that are ordered at last visit Let her know that echocardiogram shows normal pumping function of the heart and valves are normal which is good news.  Reorder the percussion vest through [facility] if needed and okay to go back to Trelegy if she does not like the [place]  Not sure putting plastic on windows will help with pollen exposure.  Looks like her primary care has already written a letter that she needs filters changed at home every 3 months.  I think that would be adequate.  Chilton Greathouse MD [facility] 04/17/2023, 6:45 PM

## 2023-04-18 NOTE — Telephone Encounter (Signed)
Called caregiver but she did not answer. Left message for her to call back.

## 2023-04-19 ENCOUNTER — Encounter: Payer: 59 | Attending: Physical Medicine and Rehabilitation | Admitting: Registered Nurse

## 2023-04-19 ENCOUNTER — Encounter: Payer: Self-pay | Admitting: Registered Nurse

## 2023-04-19 VITALS — BP 121/80 | HR 93 | Ht 63.5 in | Wt 172.0 lb

## 2023-04-19 DIAGNOSIS — G894 Chronic pain syndrome: Secondary | ICD-10-CM

## 2023-04-19 DIAGNOSIS — M545 Low back pain, unspecified: Secondary | ICD-10-CM | POA: Diagnosis present

## 2023-04-19 DIAGNOSIS — M25511 Pain in right shoulder: Secondary | ICD-10-CM | POA: Diagnosis not present

## 2023-04-19 DIAGNOSIS — Z5181 Encounter for therapeutic drug level monitoring: Secondary | ICD-10-CM | POA: Diagnosis present

## 2023-04-19 DIAGNOSIS — M2550 Pain in unspecified joint: Secondary | ICD-10-CM

## 2023-04-19 DIAGNOSIS — Z79891 Long term (current) use of opiate analgesic: Secondary | ICD-10-CM

## 2023-04-19 DIAGNOSIS — G610 Guillain-Barre syndrome: Secondary | ICD-10-CM | POA: Diagnosis present

## 2023-04-19 DIAGNOSIS — G8929 Other chronic pain: Secondary | ICD-10-CM | POA: Diagnosis present

## 2023-04-19 NOTE — Progress Notes (Addendum)
Subjective:    Patient ID: Jill Spence, female    DOB: 1951/03/27, 72 y.o.   MRN: [id number]  HPI: Jill Spence is a 72 y.o. female who returns for follow up appointment for chronic pain and medication refill. She states her pain is located in her right shoulder, lower back pain and right Patella. She also reports tingling and burning in her bilateral finger tips and bilateral feet. She rates her pain 10. Her current exercise regime is walking with walker.   Jill Spence was asking for a increase in her Oxycodone dose, due to her pill count, she was educated on her medication and compliance. She was also was instructed to keep a Pain Journal, she her PCA and worker verbalizes understanding.   Jill Spence Morphine equivalent is 22.50 MME.   Last UDS was Performed on 10/05/2022, it was consistent.    Pain Inventory Average Pain 8 Pain Right Now 10 My pain is constant, tingling, and aching  In the last 24 hours, has pain interfered with the following? General activity 7 Relation with others 7 Enjoyment of life 5 What TIME of day is your pain at its worst? night Sleep (in general) Poor  Pain is worse with: walking, standing, and some activites Pain improves with: rest and medication Relief from Meds:  Fair      Family History  Problem Relation Age of Onset  . Cancer Father    Social History   Socioeconomic History  . Marital status: Widowed    Spouse name: Not on file  . Number of children: Not on file  . Years of education: Not on file  . Highest education level: Some college, no degree  Occupational History  . Not on file  Tobacco Use  . Smoking status: Former    Packs/day: 0.25    Years: 6.00    Additional pack years: 0.00    Total pack years: 1.50    Types: Cigarettes    Quit date: 10/14/2007    Years since quitting: 15.5  . Smokeless tobacco: Never  Vaping Use  . Vaping Use: Never used  Substance and Sexual Activity  . Alcohol use: No  . Drug use: No   . Sexual activity: Not on file  Other Topics Concern  . Not on file  Social History Narrative  . Not on file   Social Determinants of Health   Financial Resource Strain: Medium Risk (03/29/2023)   Overall Financial Resource Strain (CARDIA)   . Difficulty of Paying Living Expenses: Somewhat hard  Food Insecurity: Food Insecurity Present (03/29/2023)   Hunger Vital Sign   . Worried About Programme researcher, broadcasting/film/video in the Last Year: Never true   . Ran Out of Food in the Last Year: Often true  Transportation Needs: No Transportation Needs (03/29/2023)   PRAPARE - Transportation   . Lack of Transportation (Medical): No   . Lack of Transportation (Non-Medical): No  Physical Activity: Insufficiently Active (03/29/2023)   Exercise Vital Sign   . Days of Exercise per Week: 1 day   . Minutes of Exercise per Session: 30 min  Stress: Stress Concern Present (03/29/2023)   [employer] of Occupational Health - Occupational Stress Questionnaire   . Feeling of Stress : To some extent  Social Connections: Moderately Isolated (03/29/2023)   Social Connection and Isolation Panel [NHANES]   . Frequency of Communication with Friends and Family: More than three times a week   . Frequency of Social Gatherings with  Friends and Family: Once a week   . Attends Religious Services: 1 to 4 times per year   . Active Member of Clubs or Organizations: No   . Attends Banker Meetings: Never   . Marital Status: Widowed   Past Surgical History:  Procedure Laterality Date  . BREAST SURGERY    . COLONOSCOPY    . EYE SURGERY Bilateral    cataract surgery with lens implants  . MANDIBLE RECONSTRUCTION    . MINOR REMOVAL OF MANDIBULAR HARDWARE Right 10/15/2015   Procedure: MINOR REMOVAL OF Right MANDIBULAR HARDWARE;  Surgeon: Ocie Doyne, DDS;  Location: [facility] OR;  Service: Oral Surgery;  Laterality: Right;  . TONSILLECTOMY     Past Medical History:  Diagnosis Date  . Anemia    low iron  . Anxiety   .  Arthritis   . Bipolar disorder (HCC)   . COPD (chronic obstructive pulmonary disease) (HCC)   . Family history of adverse reaction to anesthesia    mom "was put to sleep and she never woke up" -   . GERD (gastroesophageal reflux disease)   . Guillain Barr syndrome (HCC)    numbness in toes, legs hurt  . Headache    migraines as a teenager  . IBS (irritable bowel syndrome)   . Lump, breast    removed years ago per Pt.  . Pneumonia   . Restless leg syndrome   . Seizures (HCC)    only has one when she gets upset, has "quiet' seizures   There were no vitals taken for this visit.  Opioid Risk Score:   Fall Risk Score:  `1  Depression screen PHQ 2/9     02/20/2023    1:54 PM 12/07/2022    1:42 PM 10/05/2022    1:58 PM 08/10/2022    1:30 PM 05/09/2022    1:36 PM 04/04/2022    1:21 PM 02/20/2022   12:58 PM  Depression screen PHQ 2/9  Decreased Interest 0 0 0 1 0 0 0  Down, Depressed, Hopeless 0 0 0 1 0 0 0  PHQ - 2 Score 0 0 0 2 0 0 0  Altered sleeping       1  Tired, decreased energy       0  Change in appetite       0  Feeling bad or failure about yourself        0  Trouble concentrating       1  Moving slowly or fidgety/restless       3  Suicidal thoughts       0  PHQ-9 Score       5  Difficult doing work/chores       Not difficult at all    Review of Systems  Musculoskeletal:  Positive for gait problem.       Pain in the right shoulder, hands, both legs below the knees, right knee pain, pain both elbows  All other systems reviewed and are negative.     Objective:   Physical Exam Vitals and nursing note reviewed.  Constitutional:      Appearance: Normal appearance.  Cardiovascular:     Rate and Rhythm: Normal rate and regular rhythm.     Pulses: Normal pulses.     Heart sounds: Normal heart sounds.  Pulmonary:     Effort: Pulmonary effort is normal.     Breath sounds: Normal breath sounds.     Comments: Continuous Oxygen 3  Liters  Nasal Cannula  Musculoskeletal:      Cervical back: Normal range of motion and neck supple.     Comments: Normal Muscle Bulk and Muscle Testing Reveals:  Upper Extremities: Full ROM and Muscle Strength 5/5 Right AC Joint Tenderness Lumbar Paraspinal Tenderness: L-4-L-5 Lower Extremities: Right: Decreased ROM and Muscle Strength 5/5 Right Lower Extremity Flexion Produces Pain into her Right Patella Left Lower Extremity: Full ROM and Muscle Strength 5/5 Arises from Table slowly with walker Narrow Based  Gait     Skin:    General: Skin is warm and dry.  Neurological:     Mental Status: She is alert and oriented to person, place, and time.  Psychiatric:        Mood and Affect: Mood normal.        Behavior: Behavior normal.         Assessment & Plan:  Guillain Barre Syndrome: Continue current medication regimen. Continue Outpatient Therapy. Continue current medication regimen. Continue to Monitor. 04/19/2023 Polyarthralgia: Continue current medication regimen . Continue to Monitor. 04/19/2023 Chronic Pain of Both Knees: Continue Outpatient Therapy. Continue current medication regimen. Continue to Monitor. 0516/2024 Chronic Pain Syndrome: Ms. Ramcharan was instructed to keep a Pain Journal for two weeks and send a My-Chart message with update, she verbalizes understanding. Continue Oxycodone 5 mg three times a day as needed for pain. #90. We will continue the opioid monitoring program, this consists of regular clinic visits, examinations, urine drug screen, pill counts as well as use of [place] Controlled Substance Reporting system. A 12 month History has been reviewed on the [place] Controlled Substance Reporting System on 04/19/2023   F/U in 2 month

## 2023-04-19 NOTE — Telephone Encounter (Signed)
Spoke with patient caregiver Camisha (DPR).  Camisha is aware a new DME order has been placed for percussion vest. Camisha stated patient is back on Trelegy and tolerates it well.  Camisha stated at LOV 03/12/23, Dr. Isaiah Serge had dicussed patient having a PFT before her next OV since her last PFT was 12/2020. Per AVS, no PFT was to be ordered, but in plan/recommendations follow up PFT was listed.  PFT ordered and patient is scheduled 06/06/23 at 1200.  Echo results were reviewed with Camisha and understanding was given.  Camisha stated she would let patient know echo results. Nothing further at this time.

## 2023-04-23 ENCOUNTER — Encounter: Payer: Self-pay | Admitting: Registered Nurse

## 2023-04-24 ENCOUNTER — Other Ambulatory Visit: Payer: Self-pay | Admitting: Medical

## 2023-04-24 ENCOUNTER — Ambulatory Visit (HOSPITAL_BASED_OUTPATIENT_CLINIC_OR_DEPARTMENT_OTHER)
Admission: RE | Admit: 2023-04-24 | Discharge: 2023-04-24 | Disposition: A | Discharge status: Home / Self Care | Payer: 59 | Source: Ambulatory Visit | Attending: Medical | Admitting: Medical

## 2023-04-24 ENCOUNTER — Ambulatory Visit (INDEPENDENT_AMBULATORY_CARE_PROVIDER_SITE_OTHER): Payer: 59 | Admitting: Medical

## 2023-04-24 VITALS — BP 138/80 | HR 78 | Resp 18 | Ht 63.5 in | Wt 174.0 lb

## 2023-04-24 DIAGNOSIS — M2550 Pain in unspecified joint: Secondary | ICD-10-CM

## 2023-04-24 DIAGNOSIS — J438 Other emphysema: Secondary | ICD-10-CM

## 2023-04-24 DIAGNOSIS — J40 Bronchitis, not specified as acute or chronic: Secondary | ICD-10-CM

## 2023-04-24 DIAGNOSIS — R059 Cough, unspecified: Secondary | ICD-10-CM | POA: Insufficient documentation

## 2023-04-24 DIAGNOSIS — M5431 Sciatica, right side: Secondary | ICD-10-CM | POA: Diagnosis not present

## 2023-04-24 DIAGNOSIS — H6123 Impacted cerumen, bilateral: Secondary | ICD-10-CM

## 2023-04-24 LAB — CBC WITH DIFFERENTIAL/PLATELET
Basophils Absolute: 0 10*3/uL (ref 0.0–0.1)
Basophils Relative: 0.6 % (ref 0.0–3.0)
Eosinophils Absolute: 0.1 10*3/uL (ref 0.0–0.7)
Eosinophils Relative: 0.8 % (ref 0.0–5.0)
HCT: 45.3 % (ref 36.0–46.0)
Hemoglobin: 14.9 g/dL (ref 12.0–15.0)
Lymphocytes Relative: 44.2 % (ref 12.0–46.0)
Lymphs Abs: 3 10*3/uL (ref 0.7–4.0)
MCHC: 32.8 g/dL (ref 30.0–36.0)
MCV: 97 fl (ref 78.0–100.0)
Monocytes Absolute: 0.5 10*3/uL (ref 0.1–1.0)
Monocytes Relative: 7.8 % (ref 3.0–12.0)
Neutro Abs: 3.1 10*3/uL (ref 1.4–7.7)
Neutrophils Relative %: 46.6 % (ref 43.0–77.0)
Platelets: 317 10*3/uL (ref 150.0–400.0)
RBC: 4.66 Mil/uL (ref 3.87–5.11)
RDW: 14.3 % (ref 11.5–15.5)
WBC: 6.7 10*3/uL (ref 4.0–10.5)

## 2023-04-24 LAB — SEDIMENTATION RATE: Sed Rate: 13 mm/hr (ref 0–30)

## 2023-04-24 MED ORDER — BENZONATATE 100 MG PO CAPS: 100.0000 mg | ORAL_CAPSULE | Freq: Three times a day (TID) | ORAL | 0 refills | Status: DC | PRN

## 2023-04-24 MED ORDER — CEFTRIAXONE SODIUM 1 G IJ SOLR
1.0000 g | Freq: Once | INTRAMUSCULAR | Status: AC
Start: 2023-04-24 — End: 2023-04-24
  Administered 2023-04-24: 1 g via INTRAMUSCULAR

## 2023-04-24 MED ORDER — METHYLPREDNISOLONE 4 MG PO TABS: ORAL_TABLET | ORAL | 0 refills | Status: DC

## 2023-04-24 NOTE — Patient Instructions (Addendum)
Bronchitis and copd excacerbation(based on April chest xray concern for recurrent pneumonia. -benzonatate for cough, rocephin 1 gram IM and medrol 6 day taper. Decide on which oral antibiotic after lab and xray review. - CBC w/Diff - DG Chest 2 View; Future    Sciatica of right side and Arthralgia, unspecified joint Medrol dose pack today. - Sedimentation rate - Rheumatoid factor   Bilateral wax obstruction Use debrox for 4 days. Then can try ear lavage after 5 days. If unable to clear wax then refer to ENT.  Follow up 7-10 days or sooner if needed.

## 2023-04-24 NOTE — Progress Notes (Unsigned)
Subjective:    Patient ID: Jill Spence, female    DOB: 07-17-1951, 72 y.o.   MRN: [id number]  HPI   Pt in for some rt side hip area pain/si area pain. She also has rt hand pain, rt eblow pain and some shoulder pain.  Pt has prednisone tab. None used in past week or more.   Pt states she is coughing up some mucus. She has hx of copd. She cough today in office and shows me thick yellow mucus.  End of April got cxr and cbc. Refilled th z-pack and advised to follow up for lab visit in one week to repeat cbc/check wbc. That recheck was not done. Pt is on 02.   Hx of cerumen impaction. Pt indicates that in past ENT removed wax. But that was more than 5 years ago.     Review of Systems  Constitutional:  Positive for chills. Negative for fatigue and fever.  HENT:  Negative for congestion and ear discharge.        Cerumen impactoin  Respiratory:  Positive for cough and wheezing. Negative for chest tightness and stridor.   Cardiovascular:  Negative for chest pain and palpitations.  Gastrointestinal:  Negative for abdominal pain.  Genitourinary:  Negative for dysuria.  Skin:  Negative for rash.  Neurological:  Negative for dizziness and headaches.  Hematological:  Negative for adenopathy. Does not bruise/bleed easily.    Past Medical History:  Diagnosis Date   Anemia    low iron   Anxiety    Arthritis    Bipolar disorder (HCC)    COPD (chronic obstructive pulmonary disease) (HCC)    Family history of adverse reaction to anesthesia    mom "was put to sleep and she never woke up" -    GERD (gastroesophageal reflux disease)    Guillain Barr syndrome (HCC)    numbness in toes, legs hurt   Headache    migraines as a teenager   IBS (irritable bowel syndrome)    Lump, breast    removed years ago per Pt.   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 gets upset, has "quiet' seizures     Social History   Socioeconomic History   Marital  status: Widowed    Spouse name: Not on file   Number of children: Not on file   Years of education: Not on file   Highest education level: Some college, no degree  Occupational History   Not on file  Tobacco Use   Smoking status: Former    Packs/day: 0.25    Years: 6.00    Additional pack years: 0.00    Total pack years: 1.50    Types: Cigarettes    Quit date: 10/14/2007    Years since quitting: 15.5   Smokeless tobacco: Never  Vaping Use   Vaping Use: Never used  Substance and Sexual Activity   Alcohol use: No   Drug use: No   Sexual activity: Not on file  Other Topics Concern   Not on file  Social History Narrative   Not on file   Social Determinants of Health   Financial Resource Strain: Medium Risk (03/29/2023)   Overall Financial Resource Strain (CARDIA)    Difficulty of Paying Living Expenses: Somewhat hard  Food Insecurity: Food Insecurity Present (03/29/2023)   Hunger Vital Sign    Worried About Running Out of Food in the Last Year:  Never true    Ran Out of Food in the Last Year: Often true  Transportation Needs: No Transportation Needs (03/29/2023)   PRAPARE - Administrator, Civil Service (Medical): No    Lack of Transportation (Non-Medical): No  Physical Activity: Insufficiently Active (03/29/2023)   Exercise Vital Sign    Days of Exercise per Week: 1 day    Minutes of Exercise per Session: 30 min  Stress: Stress Concern Present (03/29/2023)   [employer] of Occupational Health - Occupational Stress Questionnaire    Feeling of Stress : To some extent  Social Connections: Moderately Isolated (03/29/2023)   Social Connection and Isolation Panel [NHANES]    Frequency of Communication with Friends and Family: More than three times a week    Frequency of Social Gatherings with Friends and Family: Once a week    Attends Religious Services: 1 to 4 times per year    Active Member of [employer] or Organizations: No    Attends Banker  Meetings: Never    Marital Status: Widowed  Catering manager Violence: Not on file    Past Surgical History:  Procedure Laterality Date   BREAST SURGERY     COLONOSCOPY     EYE SURGERY Bilateral    cataract surgery with lens implants   MANDIBLE RECONSTRUCTION     MINOR REMOVAL OF MANDIBULAR HARDWARE Right 10/15/2015   Procedure: MINOR REMOVAL OF Right MANDIBULAR HARDWARE;  Surgeon: Ocie Doyne, DDS;  Location: [facility] OR;  Service: Oral Surgery;  Laterality: Right;   TONSILLECTOMY      Family History  Problem Relation Age of Onset   Cancer Father     Allergies  Allergen Reactions   Influenza Vaccines    Aspirin     Other Reaction(s): GI Intolerance    Current Outpatient Medications on File Prior to Visit  Medication Sig Dispense Refill   albuterol (PROVENTIL HFA;VENTOLIN HFA) 108 (90 BASE) MCG/ACT inhaler Inhale 2 puffs into the lungs every 6 (six) hours as needed for wheezing.     albuterol (PROVENTIL) (2.5 MG/3ML) 0.083% nebulizer solution Take 2.5 mg by nebulization every 6 (six) hours as needed for wheezing.     alendronate (FOSAMAX) 70 MG tablet Take 70 mg by mouth every Sunday. Take with a full glass of water on an empty stomach.     ARIPiprazole (ABILIFY) 15 MG tablet Take 15 mg by mouth daily.     B Complex Vitamins (VITAMIN B COMPLEX) TABS Take 1 tablet by mouth daily. 30 tablet 5   benztropine (COGENTIN) 0.5 MG tablet Take 0.5 mg by mouth 2 (two) times daily.     diclofenac Sodium (VOLTAREN ARTHRITIS PAIN) 1 % GEL Apply 4 g topically 4 (four) times daily. 150 g 3   famciclovir (FAMVIR) 250 MG tablet Take 250 mg by mouth 2 (two) times daily.     Fluticasone-Umeclidin-Vilant (TRELEGY ELLIPTA) 100-62.5-25 MCG/ACT AEPB Inhale 1 puff into the lungs daily. 1 each 11   gabapentin (NEURONTIN) 400 MG capsule Take 1,200 mg by mouth 3 (three) times daily.     hydrOXYzine (ATARAX) 10 MG tablet Take 1-2 tablets (10-20 mg total) by mouth at bedtime as needed for anxiety (insomnia).  180 tablet 0   ibuprofen (ADVIL) 800 MG tablet Take 1 tablet (800 mg total) by mouth every 8 (eight) hours as needed for moderate pain. 30 tablet 2   ipratropium-albuterol (DUONEB) 0.5-2.5 (3) MG/3ML SOLN Take 3 mLs by nebulization every 4 (four) hours as needed.  360 mL 5   levocetirizine (XYZAL) 5 MG tablet Take 1 tablet (5 mg total) by mouth every evening. 30 tablet 3   loperamide (IMODIUM) 2 MG capsule 1 tab every 6-8 hours as needed diarrhea 30 capsule 1   magnesium oxide (MAG-OX) 400 MG tablet Take 250 mg by mouth daily.     methimazole (TAPAZOLE) 5 MG tablet Take 1 tablet (5 mg total) by mouth daily. 90 tablet 3   montelukast (SINGULAIR) 10 MG tablet Take 1 tablet (10 mg total) by mouth at bedtime. 90 tablet 3   olopatadine (PATANOL) 0.1 % ophthalmic solution Place 1 drop into both eyes 2 (two) times daily. 5 mL 3   oxcarbazepine (TRILEPTAL) 600 MG tablet Take 600 mg by mouth 2 (two) times daily.     oxyCODONE (ROXICODONE) 5 MG immediate release tablet Take 1 tablet (5 mg total) by mouth 3 (three) times daily as needed for severe pain. 90 tablet 0   pantoprazole (PROTONIX) 40 MG tablet Take 40 mg by mouth daily.     potassium chloride SA (KLOR-CON) 20 MEQ tablet Take 1 tablet (20 mEq total) by mouth 2 (two) times daily for 7 days. 14 tablet 0   ranitidine (ZANTAC) 300 MG capsule Take 300 mg by mouth every evening.     traZODone (DESYREL) 50 MG tablet Take 50 mg by mouth at bedtime.     Vitamin D, Ergocalciferol, (DRISDOL) 1.25 MG (50000 UNIT) CAPS capsule TAKE ONE CAPSULE BY MOUTH ONCE WEEKLY FOR SIX WEEKS 6 capsule 0   No current facility-administered medications on file prior to visit.    BP 138/80   Pulse 78   Resp 18   Ht 5' 3.5" (1.613 m)   Wt 174 lb (78.9 kg)   SpO2 98%   BMI 30.34 kg/m        Objective:   Physical Exam  General Mental Status- Alert. General Appearance- Not in acute distress.   Skin General: Color- Normal Color. Moisture- Normal  Moisture.  Neck Carotid Arteries- Normal color. Moisture- Normal Moisture. No carotid bruits. No JVD.  Chest and Lung Exam Auscultation: Breath Sounds:-Normal.  Cardiovascular Auscultation:Rythm- Regular. Murmurs & Other Heart Sounds:Auscultation of the heart reveals- No Murmurs.  Abdomen Inspection:-Inspeection Normal. Palpation/Percussion:Note:No mass. Palpation and Percussion of the abdomen reveal- Non Tender, Non Distended + BS, no rebound or guarding.   Neurologic Cranial Nerve exam:- CN III-XII intact(No nystagmus), symmetric smile. Strength:- 5/5 equal and symmetric strength both upper and lower extremities.   Heent- no sinus pressure. Pnd.  Lungs- bilateral faint expiratory wheeze. Even unalbored.  Rt wrst- mid tender to palpation. Good rom. Rt elbow- good rom but mild tender.  Rt shoulder- mild pain on rom. Back- rt si very tender.    Assessment & Plan:   Patient Instructions  Bronchitis and copd excacerbation(based on April chest xray concern for recurrent pneumonia. -benzonatate for cough, rocephin 1 gram IM and medrol 6 day taper. Decide on which oral antibiotic after lab and xray review. - CBC w/Diff - DG Chest 2 View; Future    Sciatica of right side and Arthralgia, unspecified joint Medrol dose pack today. - Sedimentation rate - Rheumatoid factor   Bilateral wax obstruction Use debrox for 4 days. Then can try ear lavage after 5 days. If unable to clear wax then refer to ENT.  Follow up 7-10 days or sooner if needed.   Ramon Dredge Liba Hulsey PA-C  Time spent with patient today was 40  minutes which consisted of chart  revdiew, discussing diagnosis, work up treatment and documentation.

## 2023-04-25 ENCOUNTER — Telehealth: Payer: Self-pay | Admitting: Pulmonary Disease

## 2023-04-25 ENCOUNTER — Encounter: Payer: Self-pay | Admitting: Medical

## 2023-04-25 LAB — RHEUMATOID FACTOR: Rheumatoid fact SerPl-aCnc: <10 IU/mL (ref <14)

## 2023-04-25 MED ORDER — DOXYCYCLINE HYCLATE 100 MG PO TABS: 100.0000 mg | ORAL_TABLET | Freq: Two times a day (BID) | ORAL | 0 refills | Status: DC

## 2023-04-25 NOTE — Addendum Note (Signed)
Addended by: Gwenevere Abbot on: 04/25/2023 06:25 AM   Modules accepted: Orders

## 2023-04-25 NOTE — Telephone Encounter (Signed)
Patient's caregiver, Sharyl Nimrod called and states patient saw PCP yesterday and was told she needs to follow up with pulmonary ASAP. Scheduled appointment 5/28 at 2:15pm. Caregiver states that patient had an xray yesterday and has pneumonia. Please call caregiver to discuss at [phone]

## 2023-04-25 NOTE — Telephone Encounter (Signed)
Spoke with caregiver. She was seen by PCP yesterday a chest xray was completed in office- no pneumonia, but states patient has bronchitis she was prescribed doxycycline-she just received prescription today. She was advised with pulmonary as soon as patient. She was scheduled for f/u on 5/28. She still has a productive cough with thick yellow phlegm.   Routing to Dr. Isaiah Serge as Lorain Childes

## 2023-05-01 ENCOUNTER — Ambulatory Visit (INDEPENDENT_AMBULATORY_CARE_PROVIDER_SITE_OTHER): Payer: 59 | Admitting: Pulmonary Disease

## 2023-05-01 ENCOUNTER — Encounter: Payer: Self-pay | Admitting: Pulmonary Disease

## 2023-05-01 VITALS — BP 118/76 | HR 76 | Temp 98.0°F | Ht 63.5 in | Wt 175.0 lb

## 2023-05-01 DIAGNOSIS — J449 Chronic obstructive pulmonary disease, unspecified: Secondary | ICD-10-CM

## 2023-05-01 DIAGNOSIS — R9389 Abnormal findings on diagnostic imaging of other specified body structures: Secondary | ICD-10-CM

## 2023-05-01 MED ORDER — PREDNISONE 10 MG PO TABS: 10.0000 mg | ORAL_TABLET | Freq: Every day | ORAL | 0 refills | Status: DC

## 2023-05-01 MED ORDER — SODIUM CHLORIDE 3 % IN NEBU: INHALATION_SOLUTION | RESPIRATORY_TRACT | 12 refills | Status: AC

## 2023-05-01 NOTE — Patient Instructions (Addendum)
Will order high resolution CT of the chest at next available for better evaluation of your lung Continue the Trelegy Will order hypertonic saline nebs twice daily Start prednisone 10 mg a day for now Will work again to try and get you on a hypertonic saline nebulizer Follow-up in 4 weeks.

## 2023-05-01 NOTE — Progress Notes (Addendum)
Jill Spence    [id number]    1951/03/23  Primary Care Physician:Saguier, Kateri Mc  Referring Physician: Esperanza Richters, PA-C [address] [place],  [place] [address]  Chief complaint: Follow-up for COPD  HPI: 72 y.o. with history of COPD on supplemental oxygen, lung nodules.   Maintained on Breo and now Trelegy inhaler.  She has had recurrent exacerbations over the past year requiring prednisone, antibiotics. She is also being followed with serial CT scans for lung nodule  States that she was hospitalized for COVID-19 at [facility] in early 2021 but I do not have any record of those. Diagnosed with multinodular goiter and hypothyroidism in 2022.  She is following with endocrinology and is on methimazole  2022 She had a CT chest and PET scan for evaluation of lung nodules PET scan showed hypermetabolic them in the right axilla.  She has been seen at dermatology and this lesion excised.  I have reviewed the previous notes from dermatology As per the patient and the final biopsy report from September 15 is benign keloid  She also had a follow-up CT chest which showed improvement in lung nodule  Pets: Has a dog Occupation: Retired Midwife Exposures: Reports mold in the previous apartment.  No current mold exposure.  No hot tub, Jacuzzi Smoking history: States that she quit smoking in 2008.  Cannot give me a clear idea of how much she smoked prior Travel history: No significant travel history Relevant family history: No significant family history of lung disease  Interim history: Continues to have dyspnea on exertion with chest congestion.  She was given Markus Daft at last visit but did not like it and is back on Trelegy  She had a PET scan for evaluation of lung nodule on July 2024 with left lower lobe nodule with mild uptake.   Outpatient Encounter Medications as of 05/01/2023  Medication Sig   albuterol (PROVENTIL HFA;VENTOLIN HFA) 108  (90 BASE) MCG/ACT inhaler Inhale 2 puffs into the lungs every 6 (six) hours as needed for wheezing.   albuterol (PROVENTIL) (2.5 MG/3ML) 0.083% nebulizer solution Take 2.5 mg by nebulization every 6 (six) hours as needed for wheezing.   alendronate (FOSAMAX) 70 MG tablet Take 70 mg by mouth every Sunday. Take with a full glass of water on an empty stomach.   ARIPiprazole (ABILIFY) 15 MG tablet Take 15 mg by mouth daily.   B Complex Vitamins (VITAMIN B COMPLEX) TABS Take 1 tablet by mouth daily.   benzonatate (TESSALON) 100 MG capsule Take 1 capsule (100 mg total) by mouth 3 (three) times daily as needed for cough.   benztropine (COGENTIN) 0.5 MG tablet Take 0.5 mg by mouth 2 (two) times daily.   diclofenac Sodium (VOLTAREN ARTHRITIS PAIN) 1 % GEL Apply 4 g topically 4 (four) times daily.   doxycycline (VIBRA-TABS) 100 MG tablet Take 1 tablet (100 mg total) by mouth 2 (two) times daily.   famciclovir (FAMVIR) 250 MG tablet Take 250 mg by mouth 2 (two) times daily.   Fluticasone-Umeclidin-Vilant (TRELEGY ELLIPTA) 100-62.5-25 MCG/ACT AEPB Inhale 1 puff into the lungs daily.   gabapentin (NEURONTIN) 400 MG capsule Take 1,200 mg by mouth 3 (three) times daily.   hydrOXYzine (ATARAX) 10 MG tablet Take 1-2 tablets (10-20 mg total) by mouth at bedtime as needed for anxiety (insomnia).   ibuprofen (ADVIL) 800 MG tablet Take 1 tablet (800 mg total) by mouth every 8 (eight)  hours as needed for moderate pain.   ipratropium-albuterol (DUONEB) 0.5-2.5 (3) MG/3ML SOLN Take 3 mLs by nebulization every 4 (four) hours as needed.   levocetirizine (XYZAL) 5 MG tablet Take 1 tablet (5 mg total) by mouth every evening.   loperamide (IMODIUM) 2 MG capsule 1 tab every 6-8 hours as needed diarrhea   magnesium oxide (MAG-OX) 400 MG tablet Take 250 mg by mouth daily.   methimazole (TAPAZOLE) 5 MG tablet Take 1 tablet (5 mg total) by mouth daily.   montelukast (SINGULAIR) 10 MG tablet Take 1 tablet (10 mg total) by mouth at  bedtime.   olopatadine (PATANOL) 0.1 % ophthalmic solution Place 1 drop into both eyes 2 (two) times daily.   oxcarbazepine (TRILEPTAL) 600 MG tablet Take 600 mg by mouth 2 (two) times daily.   oxyCODONE (ROXICODONE) 5 MG immediate release tablet Take 1 tablet (5 mg total) by mouth 3 (three) times daily as needed for severe pain.   pantoprazole (PROTONIX) 40 MG tablet Take 40 mg by mouth daily.   ranitidine (ZANTAC) 300 MG capsule Take 300 mg by mouth every evening.   traZODone (DESYREL) 50 MG tablet Take 50 mg by mouth at bedtime.   Vitamin D, Ergocalciferol, (DRISDOL) 1.25 MG (50000 UNIT) CAPS capsule TAKE ONE CAPSULE BY MOUTH ONCE WEEKLY FOR SIX WEEKS   potassium chloride SA (KLOR-CON) 20 MEQ tablet Take 1 tablet (20 mEq total) by mouth 2 (two) times daily for 7 days.   [DISCONTINUED] methylPREDNISolone (MEDROL) 4 MG tablet Standard taper 6 day course   No facility-administered encounter medications on file as of 05/01/2023.   Physical Exam: Blood pressure 116/72, pulse 76, temperature 97.9 F (36.6 C), temperature source Oral, weight 174 lb (78.9 kg), SpO2 96 %. Gen:      No acute distress HEENT:  EOMI, sclera anicteric Neck:     No masses; no thyromegaly Lungs:    Clear to auscultation bilaterally; normal respiratory effort CV:         Regular rate and rhythm; no murmurs Abd:      + bowel sounds; soft, non-tender; no palpable masses, no distension Ext:    No edema; adequate peripheral perfusion Skin:      Warm and dry; no rash Neuro: alert and oriented x 3 Psych: normal mood and affect   Data Reviewed: Imaging: CT chest 03/29/2020-cirrhosis, thyroid nodule, severe emphysema  A new 4 mm nodule is seen along the minor fissure (3/61). 6 mm nodule in the lateral  segment right middle lobe (3/69) has enlarged from 3 mm on  01/01/2019. 2 mm anterior right lower lobe nodule (3/76) is new.  Scarring in the apex of the left upper lobe with a new nodular  component measuring 6 mm (3/12).  3 mm lingular nodule (3/63) is new.  Additional tiny pulmonary nodules in the left lung are stable. No  pleural fluid. Airway is unremarkable.   CT chest 07/14/2020-new 5 mm nodule in the right middle lobe.  Remainder of the lung nodules are stable.  Enlarged pulmonary trunk  CT chest 10/20/2020-stable right middle lobe nodule, thyroid nodule, aortic atherosclerosis, emphysema  CT chest 04/14/2021-interval development of pulmonary nodules  PET scan 04/29/2021-12 mm lesion along the left anterior shoulder/axilla.  Multiple bilateral nodules up to 10 mm.  Super D CT 08/02/2021-recently noted pulm nodules are stable to decreased in size.  CT chest 04/10/2022-stable pulm nodules, severe emphysema, coronary artery disease  PET scan 06/12/2023-irregular pulmonary nodule in the left lower lobe with  mild uptake, other pulmonary nodules with no FDG uptake. I have reviewed the images personally.  PFTs: 01/26/2021 FVC 1.68 [73%], FEV1 0.72 [40%], TLC 6.19 [126%], DLCO 5.09 [26%] Severe obstruction with air trapping and hyperinflation, severe diffusion defect  Labs: CBC 07/27/2020-WBC 7.3, eos 1%, absolute eosinophil count 73 IgE 07/27/2020-5  Alpha-1 antitrypsin 08/28/2028-170, PIMM  Assessment:  Severe COPD with exacerbations Has worsening dyspnea with chest congestion.  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  As she is very symptomatic we will start her on prednisone 10 mg/day. Continue duo nebs Mucinex, flutter valve for mucociliary clearance, add percussion vest She is continuing with physical therapy.  Declined pulmonary rehab.  Abnormal PET scan There is mild uptake in the left lower lobe nodule which is likely inflammatory.  Follow-up with CT scan  Plan/Recommendations: Continue Trelegy CT follow-up Hypertonic saline nebs Start prednisone 10 mg/day  Chilton Greathouse MD [facility] 05/01/2023, 3:05 PM  CC: Esperanza Richters, PA-C

## 2023-05-02 ENCOUNTER — Ambulatory Visit (INDEPENDENT_AMBULATORY_CARE_PROVIDER_SITE_OTHER): Payer: 59 | Admitting: *Deleted

## 2023-05-02 DIAGNOSIS — Z0000 Encounter for general adult medical examination without abnormal findings: Secondary | ICD-10-CM | POA: Diagnosis not present

## 2023-05-02 DIAGNOSIS — M2550 Pain in unspecified joint: Secondary | ICD-10-CM

## 2023-05-02 DIAGNOSIS — G610 Guillain-Barre syndrome: Secondary | ICD-10-CM | POA: Diagnosis not present

## 2023-05-02 MED ORDER — NONFORMULARY OR COMPOUNDED ITEM
0 refills | Status: AC
Start: 2023-05-02 — End: ?

## 2023-05-02 NOTE — Patient Instructions (Signed)
Jill Spence , Thank you for taking time to come for your Medicare Wellness Visit. I appreciate your ongoing commitment to your health goals. Please review the following plan we discussed and let me know if I can assist you in the future.   These are the goals we discussed:  Goals   None     This is a list of the screening recommended for you and due dates:  Health Maintenance  Topic Date Due   COVID-19 Vaccine (1) Never done   Pneumonia Vaccine (1 of 2 - PCV) Never done   DTaP/Tdap/Td vaccine (1 - Tdap) Never done   Zoster (Shingles) Vaccine (1 of 2) Never done   Colon Cancer Screening  Never done   Mammogram  Never done   DEXA scan (bone density measurement)  Never done   Hepatitis C Screening  07/06/2024*   Flu Shot  07/05/2023   Medicare Annual Wellness Visit  05/01/2024   HPV Vaccine  Aged Out  *Topic was postponed. The date shown is not the original due date.     Next appointment: Follow up in one year for your annual wellness visit.   Preventive Care 66 Years and Older, Female Preventive care refers to lifestyle choices and visits with your health care provider that can promote health and wellness. What does preventive care include? A yearly physical exam. This is also called an annual well check. Dental exams once or twice a year. Routine eye exams. Ask your health care provider how often you should have your eyes checked. Personal lifestyle choices, including: Daily care of your teeth and gums. Regular physical activity. Eating a healthy diet. Avoiding tobacco and drug use. Limiting alcohol use. Practicing safe sex. Taking low-dose aspirin every day. Taking vitamin and mineral supplements as recommended by your health care provider. What happens during an annual well check? The services and screenings done by your health care provider during your annual well check will depend on your age, overall health, lifestyle risk factors, and family history of  disease. Counseling  Your health care provider may ask you questions about your: Alcohol use. Tobacco use. Drug use. Emotional well-being. Home and relationship well-being. Sexual activity. Eating habits. History of falls. Memory and ability to understand (cognition). Work and work Astronomer. Reproductive health. Screening  You may have the following tests or measurements: Height, weight, and BMI. Blood pressure. Lipid and cholesterol levels. These may be checked every 5 years, or more frequently if you are over 25 years old. Skin check. Lung cancer screening. You may have this screening every year starting at age 77 if you have a 30-pack-year history of smoking and currently smoke or have quit within the past 15 years. Fecal occult blood test (FOBT) of the stool. You may have this test every year starting at age 21. Flexible sigmoidoscopy or colonoscopy. You may have a sigmoidoscopy every 5 years or a colonoscopy every 10 years starting at age 65. Hepatitis C blood test. Hepatitis B blood test. Sexually transmitted disease (STD) testing. Diabetes screening. This is done by checking your blood sugar (glucose) after you have not eaten for a while (fasting). You may have this done every 1-3 years. Bone density scan. This is done to screen for osteoporosis. You may have this done starting at age 30. Mammogram. This may be done every 1-2 years. Talk to your health care provider about how often you should have regular mammograms. Talk with your health care provider about your test results, treatment options,  and if necessary, the need for more tests. Vaccines  Your health care provider may recommend certain vaccines, such as: Influenza vaccine. This is recommended every year. Tetanus, diphtheria, and acellular pertussis (Tdap, Td) vaccine. You may need a Td booster every 10 years. Zoster vaccine. You may need this after age 66. Pneumococcal 13-valent conjugate (PCV13) vaccine. One  dose is recommended after age 48. Pneumococcal polysaccharide (PPSV23) vaccine. One dose is recommended after age 22. Talk to your health care provider about which screenings and vaccines you need and how often you need them. This information is not intended to replace advice given to you by your health care provider. Make sure you discuss any questions you have with your health care provider. Document Released: 12/17/2015 Document Revised: 08/09/2016 Document Reviewed: 09/21/2015 Elsevier Interactive Patient Education  2017 [employer].  Fall Prevention in the Home Falls can cause injuries. They can happen to people of all ages. There are many things you can do to make your home safe and to help prevent falls. What can I do on the outside of my home? Regularly fix the edges of walkways and driveways and fix any cracks. Remove anything that might make you trip as you walk through a door, such as a raised step or threshold. Trim any bushes or trees on the path to your home. Use bright outdoor lighting. Clear any walking paths of anything that might make someone trip, such as rocks or tools. Regularly check to see if handrails are loose or broken. Make sure that both sides of any steps have handrails. Any raised decks and porches should have guardrails on the edges. Have any leaves, snow, or ice cleared regularly. Use sand or salt on walking paths during winter. Clean up any spills in your garage right away. This includes oil or grease spills. What can I do in the bathroom? Use night lights. Install grab bars by the toilet and in the tub and shower. Do not use towel bars as grab bars. Use non-skid mats or decals in the tub or shower. If you need to sit down in the shower, use a plastic, non-slip stool. Keep the floor dry. Clean up any water that spills on the floor as soon as it happens. Remove soap buildup in the tub or shower regularly. Attach bath mats securely with double-sided  non-slip rug tape. Do not have throw rugs and other things on the floor that can make you trip. What can I do in the bedroom? Use night lights. Make sure that you have a light by your bed that is easy to reach. Do not use any sheets or blankets that are too big for your bed. They should not hang down onto the floor. Have a firm chair that has side arms. You can use this for support while you get dressed. Do not have throw rugs and other things on the floor that can make you trip. What can I do in the kitchen? Clean up any spills right away. Avoid walking on wet floors. Keep items that you use a lot in easy-to-reach places. If you need to reach something above you, use a strong step stool that has a grab bar. Keep electrical cords out of the way. Do not use floor polish or wax that makes floors slippery. If you must use wax, use non-skid floor wax. Do not have throw rugs and other things on the floor that can make you trip. What can I do with my stairs? Do not leave  any items on the stairs. Make sure that there are handrails on both sides of the stairs and use them. Fix handrails that are broken or loose. Make sure that handrails are as long as the stairways. Check any carpeting to make sure that it is firmly attached to the stairs. Fix any carpet that is loose or worn. Avoid having throw rugs at the top or bottom of the stairs. If you do have throw rugs, attach them to the floor with carpet tape. Make sure that you have a light switch at the top of the stairs and the bottom of the stairs. If you do not have them, ask someone to add them for you. What else can I do to help prevent falls? Wear shoes that: Do not have high heels. Have rubber bottoms. Are comfortable and fit you well. Are closed at the toe. Do not wear sandals. If you use a stepladder: Make sure that it is fully opened. Do not climb a closed stepladder. Make sure that both sides of the stepladder are locked into place. Ask  someone to hold it for you, if possible. Clearly mark and make sure that you can see: Any grab bars or handrails. First and last steps. Where the edge of each step is. Use tools that help you move around (mobility aids) if they are needed. These include: Canes. Walkers. Scooters. Crutches. Turn on the lights when you go into a dark area. Replace any light bulbs as soon as they burn out. Set up your furniture so you have a clear path. Avoid moving your furniture around. If any of your floors are uneven, fix them. If there are any pets around you, be aware of where they are. Review your medicines with your doctor. Some medicines can make you feel dizzy. This can increase your chance of falling. Ask your doctor what other things that you can do to help prevent falls. This information is not intended to replace advice given to you by your health care provider. Make sure you discuss any questions you have with your health care provider. Document Released: 09/16/2009 Document Revised: 04/27/2016 Document Reviewed: 12/25/2014 Elsevier Interactive Patient Education  2017 [employer].

## 2023-05-02 NOTE — Progress Notes (Signed)
Subjective:  Pt completed ADLs, Fall risk, and SDOH during e-check in on 04/25/23.  Answers verified with pt.    Jill Spence is a 72 y.o. female who presents for Medicare Annual (Subsequent) preventive examination.  I connected with  Jill Spence on 05/02/23 by a audio enabled telemedicine application and verified that I am speaking with the correct person using two identifiers.  Patient Location: Home  Provider Location: Office/Clinic  I discussed the limitations of evaluation and management by telemedicine. The patient expressed understanding and agreed to proceed.   Review of Systems     Cardiac Risk Factors include: advanced age (>78men, >60 women);obesity (BMI >30kg/m2)     Objective:    There were no vitals filed for this visit. There is no height or weight on file to calculate BMI.     05/02/2023    3:51 PM 10/15/2015    7:00 AM  Advanced Directives  Does Patient Have a Medical Advance Directive? Yes Yes  Type of Estate agent of [place];Living will Living will;Healthcare Power of Attorney  Copy of Healthcare Power of Attorney in Chart? No - copy requested No - copy requested    Current Medications (verified) Outpatient Encounter Medications as of 05/02/2023  Medication Sig   albuterol (PROVENTIL HFA;VENTOLIN HFA) 108 (90 BASE) MCG/ACT inhaler Inhale 2 puffs into the lungs every 6 (six) hours as needed for wheezing.   albuterol (PROVENTIL) (2.5 MG/3ML) 0.083% nebulizer solution Take 2.5 mg by nebulization every 6 (six) hours as needed for wheezing.   alendronate (FOSAMAX) 70 MG tablet Take 70 mg by mouth every Sunday. Take with a full glass of water on an empty stomach.   ARIPiprazole (ABILIFY) 15 MG tablet Take 15 mg by mouth daily.   B Complex Vitamins (VITAMIN B COMPLEX) TABS Take 1 tablet by mouth daily.   benzonatate (TESSALON) 100 MG capsule Take 1 capsule (100 mg total) by mouth 3 (three) times daily as needed for cough.    benztropine (COGENTIN) 0.5 MG tablet Take 0.5 mg by mouth 2 (two) times daily.   diclofenac Sodium (VOLTAREN ARTHRITIS PAIN) 1 % GEL Apply 4 g topically 4 (four) times daily.   famciclovir (FAMVIR) 250 MG tablet Take 250 mg by mouth 2 (two) times daily.   Fluticasone-Umeclidin-Vilant (TRELEGY ELLIPTA) 100-62.5-25 MCG/ACT AEPB Inhale 1 puff into the lungs daily.   gabapentin (NEURONTIN) 400 MG capsule Take 1,200 mg by mouth 3 (three) times daily.   hydrOXYzine (ATARAX) 10 MG tablet Take 1-2 tablets (10-20 mg total) by mouth at bedtime as needed for anxiety (insomnia).   ibuprofen (ADVIL) 800 MG tablet Take 1 tablet (800 mg total) by mouth every 8 (eight) hours as needed for moderate pain.   ipratropium-albuterol (DUONEB) 0.5-2.5 (3) MG/3ML SOLN Take 3 mLs by nebulization every 4 (four) hours as needed.   levocetirizine (XYZAL) 5 MG tablet Take 1 tablet (5 mg total) by mouth every evening.   loperamide (IMODIUM) 2 MG capsule 1 tab every 6-8 hours as needed diarrhea   magnesium oxide (MAG-OX) 400 MG tablet Take 250 mg by mouth daily.   methimazole (TAPAZOLE) 5 MG tablet Take 1 tablet (5 mg total) by mouth daily.   montelukast (SINGULAIR) 10 MG tablet Take 1 tablet (10 mg total) by mouth at bedtime.   olopatadine (PATANOL) 0.1 % ophthalmic solution Place 1 drop into both eyes 2 (two) times daily.   oxcarbazepine (TRILEPTAL) 600 MG tablet Take 600 mg by mouth 2 (two) times  daily.   oxyCODONE (ROXICODONE) 5 MG immediate release tablet Take 1 tablet (5 mg total) by mouth 3 (three) times daily as needed for severe pain.   pantoprazole (PROTONIX) 40 MG tablet Take 40 mg by mouth daily.   potassium chloride SA (KLOR-CON) 20 MEQ tablet Take 1 tablet (20 mEq total) by mouth 2 (two) times daily for 7 days.   predniSONE (DELTASONE) 10 MG tablet Take 1 tablet (10 mg total) by mouth daily with breakfast.   ranitidine (ZANTAC) 300 MG capsule Take 300 mg by mouth every evening.   sodium chloride HYPERTONIC 3 %  nebulizer solution 3 ml via neb twice daily   traZODone (DESYREL) 50 MG tablet Take 50 mg by mouth at bedtime.   Vitamin D, Ergocalciferol, (DRISDOL) 1.25 MG (50000 UNIT) CAPS capsule TAKE ONE CAPSULE BY MOUTH ONCE WEEKLY FOR SIX WEEKS   No facility-administered encounter medications on file as of 05/02/2023.    Allergies (verified) Influenza vaccines and Aspirin   History: Past Medical History:  Diagnosis Date   Anemia    low iron   Anxiety    Arthritis    Bipolar disorder (HCC)    Cancer (HCC) 1973   COPD (chronic obstructive pulmonary disease) (HCC)    Family history of adverse reaction to anesthesia    mom "was put to sleep and she never woke up" -    GERD (gastroesophageal reflux disease)    Guillain Barr syndrome (HCC)    numbness in toes, legs hurt   Headache    migraines as a teenager   IBS (irritable bowel syndrome)    Lump, breast    removed years ago per Pt.   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, has "quiet' seizures   Thyroid disease    Past Surgical History:  Procedure Laterality Date   BREAST SURGERY     COLONOSCOPY     EYE SURGERY Bilateral    cataract surgery with lens implants   MANDIBLE RECONSTRUCTION     MINOR REMOVAL OF MANDIBULAR HARDWARE Right 10/15/2015   Procedure: MINOR REMOVAL OF Right MANDIBULAR HARDWARE;  Surgeon: Ocie Doyne, DDS;  Location: [facility] OR;  Service: Oral Surgery;  Laterality: Right;   TONSILLECTOMY     Family History  Problem Relation Age of Onset   Cancer Father    Social History   Socioeconomic History   Marital status: Widowed    Spouse name: Not on file   Number of children: Not on file   Years of education: Not on file   Highest education level: Some college, no degree  Occupational History   Not on file  Tobacco Use   Smoking status: Former    Packs/day: 0.25    Years: 6.00    Additional pack years: 0.00    Total pack years: 1.50     Types: Cigarettes    Quit date: 10/14/2007    Years since quitting: 15.5   Smokeless tobacco: Never  Vaping Use   Vaping Use: Never used  Substance and Sexual Activity   Alcohol use: No   Drug use: No   Sexual activity: Not on file  Other Topics Concern   Not on file  Social History Narrative   Not on file   Social Determinants of Health   Financial Resource Strain: Medium Risk (04/25/2023)   Overall Financial Resource Strain (CARDIA)  Difficulty of Paying Living Expenses: Somewhat hard  Food Insecurity: Food Insecurity Present (04/25/2023)   Hunger Vital Sign    Worried About Running Out of Food in the Last Year: Sometimes true    Ran Out of Food in the Last Year: Sometimes true  Transportation Needs: No Transportation Needs (04/25/2023)   PRAPARE - Administrator, Civil Service (Medical): No    Lack of Transportation (Non-Medical): No  Physical Activity: Insufficiently Active (04/25/2023)   Exercise Vital Sign    Days of Exercise per Week: 1 day    Minutes of Exercise per Session: 30 min  Stress: Stress Concern Present (04/25/2023)   [employer] of Occupational Health - Occupational Stress Questionnaire    Feeling of Stress : To some extent  Social Connections: Moderately Isolated (04/25/2023)   Social Connection and Isolation Panel [NHANES]    Frequency of Communication with Friends and Family: More than three times a week    Frequency of Social Gatherings with Friends and Family: Once a week    Attends Religious Services: 1 to 4 times per year    Active Member of [employer] or Organizations: No    Attends Banker Meetings: Never    Marital Status: Widowed    Tobacco Counseling Counseling given: Not Answered   Clinical Intake:  Pre-visit preparation completed: Yes  Pain : No/denies pain  BMI - recorded: 30.51 Nutritional Status: BMI > 30  Obese Nutritional Risks: None Diabetes: No  How often do you need to have someone help you when  you read instructions, pamphlets, or other written materials from your doctor or pharmacy?: 5 - Always   Activities of Daily Living    04/25/2023    1:43 PM 03/16/2023   12:43 PM  In your present state of health, do you have any difficulty performing the following activities:  Hearing? 0 0  Vision? 1 1  Difficulty concentrating or making decisions? 1 1  Walking or climbing stairs? 1 1  Dressing or bathing? 1 1  Doing errands, shopping? 1 1  Preparing Food and eating ? Y Y  Using the Toilet? N Y  In the past six months, have you accidently leaked urine? Y Y  Do you have problems with loss of bowel control? N N  Managing your Medications? N N  Managing your Finances? N N  Housekeeping or managing your Housekeeping? Malvin Johns    Patient Care Team: Saguier, Kateri Mc as PCP - General (Internal Medicine)  Indicate any recent Medical Services you may have received from other than [facility] providers in the past year (date may be approximate).     Assessment:   This is a routine wellness examination for Gabriell.  Hearing/Vision screen No results found.  Dietary issues and exercise activities discussed: Current Exercise Habits: The patient does not participate in regular exercise at present, Exercise limited by: respiratory conditions(s)   Goals Addressed   None    Depression Screen    05/02/2023    3:50 PM 04/19/2023    1:43 PM 02/20/2023    1:54 PM 12/07/2022    1:42 PM 10/05/2022    1:58 PM 08/10/2022    1:30 PM 05/09/2022    1:36 PM  PHQ 2/9 Scores  PHQ - 2 Score 0 0 0 0 0 2 0    Fall Risk    04/25/2023    1:43 PM 04/19/2023    1:43 PM 03/16/2023   12:43 PM 02/20/2023    1:54  PM 12/07/2022    1:41 PM  Fall Risk   Falls in the past year? 1 0 1 1 0  Number falls in past yr: 0 0 0 0 0  Injury with Fall? 0 0 0 0 0  Risk for fall due to : Impaired balance/gait  No Fall Risks Impaired balance/gait   Follow up Falls evaluation completed  Falls evaluation completed      FALL RISK  PREVENTION PERTAINING TO THE HOME:  Any stairs in or around the home? No  Home free of loose throw rugs in walkways, pet beds, electrical cords, etc? Yes  Adequate lighting in your home to reduce risk of falls? Yes   ASSISTIVE DEVICES UTILIZED TO PREVENT FALLS:  Life alert? Yes  Use of a cane, walker or w/c? Yes  Grab bars in the bathroom? Yes  Shower chair or bench in shower? Yes  Elevated toilet seat or a handicapped toilet? No   TIMED UP AND GO:  Was the test performed?  No, audio visit .   Cognitive Function:    05/02/2023    3:54 PM  MMSE - Mini Mental State Exam  Not completed: Unable to complete        Immunizations  There is no immunization history on file for this patient.  TDAP status: Due, Education has been provided regarding the importance of this vaccine. Advised may receive this vaccine at local pharmacy or Health Dept. Aware to provide a copy of the vaccination record if obtained from local pharmacy or Health Dept. Verbalized acceptance and understanding.  Flu Vaccine status: Up to date  Pneumococcal vaccine status: Due, Education has been provided regarding the importance of this vaccine. Advised may receive this vaccine at local pharmacy or Health Dept. Aware to provide a copy of the vaccination record if obtained from local pharmacy or Health Dept. Verbalized acceptance and understanding.  Covid-19 vaccine status: Information provided on how to obtain vaccines.   Qualifies for Shingles Vaccine? Yes   Zostavax completed No   Shingrix Completed?: No.    Education has been provided regarding the importance of this vaccine. Patient has been advised to call insurance company to determine out of pocket expense if they have not yet received this vaccine. Advised may also receive vaccine at local pharmacy or Health Dept. Verbalized acceptance and understanding.  Screening Tests Health Maintenance  Topic Date Due   Medicare Annual Wellness (AWV)  Never done    COVID-19 Vaccine (1) Never done   Pneumonia Vaccine 40+ Years old (1 of 2 - PCV) Never done   DTaP/Tdap/Td (1 - Tdap) Never done   Zoster Vaccines- Shingrix (1 of 2) Never done   Colonoscopy  Never done   MAMMOGRAM  Never done   DEXA SCAN  Never done   Hepatitis C Screening  07/06/2024 (Originally 10/15/1969)   INFLUENZA VACCINE  07/05/2023   HPV VACCINES  Aged Out    Health Maintenance  Health Maintenance Due  Topic Date Due   Medicare Annual Wellness (AWV)  Never done   COVID-19 Vaccine (1) Never done   Pneumonia Vaccine 3+ Years old (1 of 2 - PCV) Never done   DTaP/Tdap/Td (1 - Tdap) Never done   Zoster Vaccines- Shingrix (1 of 2) Never done   Colonoscopy  Never done   MAMMOGRAM  Never done   DEXA SCAN  Never done    Colorectal cancer screening: Type of screening: Colonoscopy. Completed 03/04/20. Repeat every 5 years [facility]  Mammogram  status: Completed 06/27/22. Repeat every year  Bone Density status: Completed Pt can't remember date. Results reflect: Bone density results: OSTEOPOROSIS. Repeat every 2 years.  Lung Cancer Screening: (Low Dose CT Chest recommended if Age 23-80 years, 30 pack-year currently smoking OR have quit w/in 15years.) does not qualify.    Additional Screening:  Hepatitis C Screening: does qualify; Completed N/a  Vision Screening: Recommended annual ophthalmology exams for early detection of glaucoma and other disorders of the eye. Is the patient up to date with their annual eye exam?  Yes  Who is the provider or what is the name of the office in which the patient attends annual eye exams? Dr. Atlee Abide If pt is not established with a provider, would they like to be referred to a provider to establish care? No .   Dental Screening: Recommended annual dental exams for proper oral hygiene  Community Resource Referral / Chronic Care Management: CRR required this visit?  No   CCM required this visit?  No      Plan:     I have  personally reviewed and noted the following in the patient's chart:   Medical and social history Use of alcohol, tobacco or illicit drugs  Current medications and supplements including opioid prescriptions. Patient is currently taking opioid prescriptions. Information provided to patient regarding non-opioid alternatives. Patient advised to discuss non-opioid treatment plan with their provider. Functional ability and status Nutritional status Physical activity Advanced directives List of other physicians Hospitalizations, surgeries, and ER visits in previous 12 months Vitals Screenings to include cognitive, depression, and falls Referrals and appointments  In addition, I have reviewed and discussed with patient certain preventive protocols, quality metrics, and best practice recommendations. A written personalized care plan for preventive services as well as general preventive health recommendations were provided to patient.   Due to this being a telephonic visit, the after visit summary with patients personalized plan was offered to patient via mail or my-chart. Patient would like to access on my-chart.  Donne Anon, [place]   05/02/2023   Nurse Notes: None

## 2023-05-05 ENCOUNTER — Other Ambulatory Visit: Payer: Self-pay | Admitting: Medical

## 2023-05-08 ENCOUNTER — Ambulatory Visit: Payer: 59 | Admitting: Medical

## 2023-05-09 ENCOUNTER — Ambulatory Visit: Payer: 59 | Admitting: Medical

## 2023-05-10 ENCOUNTER — Telehealth: Payer: Self-pay | Admitting: Medical

## 2023-05-10 ENCOUNTER — Ambulatory Visit (HOSPITAL_BASED_OUTPATIENT_CLINIC_OR_DEPARTMENT_OTHER)
Admission: RE | Admit: 2023-05-10 | Discharge: 2023-05-10 | Disposition: A | Discharge status: Home / Self Care | Payer: 59 | Source: Ambulatory Visit | Attending: Pulmonary Disease | Admitting: Pulmonary Disease

## 2023-05-10 DIAGNOSIS — J449 Chronic obstructive pulmonary disease, unspecified: Secondary | ICD-10-CM | POA: Diagnosis present

## 2023-05-10 DIAGNOSIS — R9389 Abnormal findings on diagnostic imaging of other specified body structures: Secondary | ICD-10-CM | POA: Insufficient documentation

## 2023-05-10 NOTE — Telephone Encounter (Signed)
Pt is asking for an order for a walker with wheels and a seat. Stated that they have talked about it with PCP and have been waiting on the order. Requested a message be sent to PCP since the insurance does not have one yet. Wanted to speak directly with PCP or CMA about the walker today but was informed that both were out of office.

## 2023-05-11 ENCOUNTER — Telehealth: Payer: Self-pay

## 2023-05-11 NOTE — Telephone Encounter (Signed)
Pt has an appointment on 05/15/23 .Marland Kitchen Pt requested a new o2 stat reader on visit 04/02/23. Our DME order workers are requesting at the time of next visit to have her o2 liter flow and continuous flow updated . Are you able to complete or do you want to defer order to pulmonology

## 2023-05-11 NOTE — Telephone Encounter (Addendum)
   She doesn't have with [place]. So she would need to request that by her O2 provider.  Therisa Doyne, CMA; Santina Evans I can use those notes but order and sats will still need to be updated.     Previous Messages    ----- Message ----- From: Maximino Sarin, CMA Sent: 05/11/2023  10:56 AM EDT To: Santina Evans Subject: FW: DME orders                                Also pt is requesting " Easy pulse 5+ 6 // oxygen conserving regulator" not o2 ----- Message ----- From: Santina Evans Sent: 05/11/2023  10:07 AM EDT To: Santina Evans; Maximino Sarin, CMA Subject: RE: DME orders                               Yes ma'am      Previous Messages    ----- Message ----- From: Maximino Sarin, CMA Sent: 05/11/2023   9:21 AM EDT To: Santina Evans Subject: FW: DME orders                                Okay Stats and order will be updated at her upcoming visit on 05/15/23 , could the walker be started on today ----- Message ----- From: Santina Evans Sent: 05/11/2023   9:05 AM EDT To: Henderson Newcomer; Kathyrn Sheriff; Santina Evans; * Subject: RE: DME orders                                I see a Rollator and O2. We would need a updated O2 order to state a liter flow and continuous because pt isn't a current O2 user. and we would also need updated sats.  Thanks!    ----- Message ----- From: Maximino Sarin, CMA Sent: 05/11/2023   8:13 AM EDT To: Henderson Newcomer; Kathyrn Sheriff; Santina Evans; * Subject: DME orders                                    I placed DME orders on patient 04/02/2023 , forgot to send over community message.

## 2023-05-11 NOTE — Telephone Encounter (Signed)
Pt is requesting " Easy pulse 5+ 6 // oxygen conserving regulator" for her oxygen tank , are you able to include 02 stat liter flow and continuous flow for the order that was placed in 04/02/23.

## 2023-05-11 NOTE — Telephone Encounter (Signed)
Community message sent to [facility] , orders were placed 04/02/23

## 2023-05-14 ENCOUNTER — Telehealth: Payer: Self-pay | Admitting: Registered Nurse

## 2023-05-14 MED ORDER — OXYCODONE HCL 5 MG PO TABS: 5.0000 mg | ORAL_TABLET | Freq: Three times a day (TID) | ORAL | 0 refills | Status: DC | PRN

## 2023-05-14 NOTE — Telephone Encounter (Signed)
Patient needs a refill on oxycodone.  Please call patient.

## 2023-05-14 NOTE — Telephone Encounter (Signed)
PMP was Reviewed.  Oxycodone e-scribed today, call placed to Ms. Artiga regarding the above. She verbalizes understanding.

## 2023-05-14 NOTE — Telephone Encounter (Signed)
PMP was reviewed

## 2023-05-15 ENCOUNTER — Ambulatory Visit (INDEPENDENT_AMBULATORY_CARE_PROVIDER_SITE_OTHER): Payer: 59 | Admitting: Medical

## 2023-05-15 VITALS — BP 142/84 | HR 88 | Temp 98.1°F | Resp 22 | Wt 174.0 lb

## 2023-05-15 DIAGNOSIS — J449 Chronic obstructive pulmonary disease, unspecified: Secondary | ICD-10-CM

## 2023-05-15 DIAGNOSIS — K589 Irritable bowel syndrome without diarrhea: Secondary | ICD-10-CM

## 2023-05-15 DIAGNOSIS — J9611 Chronic respiratory failure with hypoxia: Secondary | ICD-10-CM | POA: Diagnosis not present

## 2023-05-15 DIAGNOSIS — R1084 Generalized abdominal pain: Secondary | ICD-10-CM | POA: Diagnosis not present

## 2023-05-15 MED ORDER — DICYCLOMINE HCL 10 MG PO CAPS: 10.0000 mg | ORAL_CAPSULE | Freq: Three times a day (TID) | ORAL | 0 refills | Status: DC

## 2023-05-15 NOTE — Telephone Encounter (Signed)
Patient order has been reordered with liter flow update. Community message sent to [employer], [employer] [facility].

## 2023-05-15 NOTE — Patient Instructions (Addendum)
1. Chronic respiratory failure with hypoxia (HCC) Follow up with pulmonologist. Reviewed recent pulmonary notes. For mucus clearance continue mucinex. - For home use only DME oxygen  2. Stage 3 severe COPD by GOLD classification (HCC) - For home use only DME oxygen  3. Generalized abdominal pain mostly upper region will rx bentyl in event pain ibs type as you had before but also follow labs as precaution.  - Comp Met (CMET) - CBC w/Diff - Lipase  Follow up date to be determined after lab review.

## 2023-05-15 NOTE — Progress Notes (Signed)
Subjective:    Patient ID: Jill Spence, female    DOB: [birth year], 72 y.o.   MRN: [id number]  HPI  Pt in for intermittent muscle spasms/abdomen discomfort but she reports it is in her abdomen area.  Pt at times is in rt upper abdomen. Pt tells me she had this pain for years.    Hx of ibs. More diarrhea type in past. Pt state now as she ages does not have obvious diarrhea.   No nausea, no vomting. No fever, no chills or sweats. Pt had bm about 1-2 times a day.   Pt has followed up with pulmonologist.  Assessment:  Severe COPD with exacerbations Has worsening dyspnea with chest congestion.  She feels that her current treatment regimen is not working for her Will change Trelegy to [employer] Continue duo nebs Mucinex, flutter valve for mucociliary clearance, add percussion vest She is continuing with physical therapy.  Declined pulmonary rehab.   Get echocardiogram and PFTs to evaluate dyspnea on exertion.   Pulmonary nodules Stable on CT scan in May 2023. Follow up is ordered for next month.  Will review it to see if there are any changes   Abnormal PET scan PET scan in 2022 showed hypermetabolic lesion along the left shoulder/axilla.  She had a skin lesion which was excised by dermatology and is reportedly a benign keloid.    Plan/Recommendations: Start Breztri instead of Trelegy Mucociliary clearance with Mucinex, flutter valve, percussion vest Echocardiogram Follow-up CT, PFTs    Review of Systems  Constitutional:  Negative for chills, fatigue and fever.  Respiratory:  Negative for cough, chest tightness, shortness of breath and wheezing.   Cardiovascular:  Negative for chest pain and palpitations.  Gastrointestinal:  Negative for abdominal pain and blood in stool.  Genitourinary:  Negative for dysuria and flank pain.  Musculoskeletal:  Negative for back pain.  Skin:  Negative for rash.  Neurological:  Negative for dizziness, light-headedness and headaches.   Psychiatric/Behavioral:  Negative for confusion and dysphoric mood. The patient is not nervous/anxious.     Past Medical History:  Diagnosis Date   Anemia    low iron   Anxiety    Arthritis    Bipolar disorder (HCC)    Cancer (HCC) 1973   COPD (chronic obstructive pulmonary disease) (HCC)    Family history of adverse reaction to anesthesia    mom "was put to sleep and she never woke up" -    GERD (gastroesophageal reflux disease)    Guillain Barr syndrome (HCC)    numbness in toes, legs hurt   Headache    migraines as a teenager   IBS (irritable bowel syndrome)    Lump, breast    removed years ago per Pt.   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, has "quiet' seizures   Thyroid disease      Social History   Socioeconomic History   Marital status: Widowed    Spouse name: Not on file   Number of children: Not on file   Years of education: Not on file   Highest education level: Some college, no degree  Occupational History   Not on file  Tobacco Use   Smoking status: Former    Packs/day: 0.25    Years: 6.00    Additional pack years: 0.00    Total pack years: 1.50    Types:  Cigarettes    Quit date: 10/14/2007    Years since quitting: 15.5   Smokeless tobacco: Never  Vaping Use   Vaping Use: Never used  Substance and Sexual Activity   Alcohol use: No   Drug use: No   Sexual activity: Not on file  Other Topics Concern   Not on file  Social History Narrative   Not on file   Social Determinants of Health   Financial Resource Strain: Medium Risk (04/25/2023)   Overall Financial Resource Strain (CARDIA)    Difficulty of Paying Living Expenses: Somewhat hard  Food Insecurity: Food Insecurity Present (04/25/2023)   Hunger Vital Sign    Worried About Running Out of Food in the Last Year: Sometimes true    Ran Out of Food in the Last Year: Sometimes true  Transportation Needs: No  Transportation Needs (04/25/2023)   PRAPARE - Administrator, Civil Service (Medical): No    Lack of Transportation (Non-Medical): No  Physical Activity: Insufficiently Active (04/25/2023)   Exercise Vital Sign    Days of Exercise per Week: 1 day    Minutes of Exercise per Session: 30 min  Stress: Stress Concern Present (04/25/2023)   [employer] of Occupational Health - Occupational Stress Questionnaire    Feeling of Stress : To some extent  Social Connections: Moderately Isolated (04/25/2023)   Social Connection and Isolation Panel [NHANES]    Frequency of Communication with Friends and Family: More than three times a week    Frequency of Social Gatherings with Friends and Family: Once a week    Attends Religious Services: 1 to 4 times per year    Active Member of [employer] or Organizations: No    Attends Banker Meetings: Never    Marital Status: Widowed  Catering manager Violence: Not on file    Past Surgical History:  Procedure Laterality Date   BREAST SURGERY     COLONOSCOPY     EYE SURGERY Bilateral    cataract surgery with lens implants   MANDIBLE RECONSTRUCTION     MINOR REMOVAL OF MANDIBULAR HARDWARE Right 10/15/2015   Procedure: MINOR REMOVAL OF Right MANDIBULAR HARDWARE;  Surgeon: Ocie Doyne, DDS;  Location: [facility] OR;  Service: Oral Surgery;  Laterality: Right;   TONSILLECTOMY      Family History  Problem Relation Age of Onset   Cancer Father     Allergies  Allergen Reactions   Influenza Vaccines    Aspirin     Other Reaction(s): GI Intolerance    Current Outpatient Medications on File Prior to Visit  Medication Sig Dispense Refill   albuterol (PROVENTIL HFA;VENTOLIN HFA) 108 (90 BASE) MCG/ACT inhaler Inhale 2 puffs into the lungs every 6 (six) hours as needed for wheezing.     albuterol (PROVENTIL) (2.5 MG/3ML) 0.083% nebulizer solution Take 2.5 mg by nebulization every 6 (six) hours as needed for wheezing.     alendronate (FOSAMAX)  70 MG tablet Take 70 mg by mouth every Sunday. Take with a full glass of water on an empty stomach.     ARIPiprazole (ABILIFY) 15 MG tablet Take 15 mg by mouth daily.     B Complex Vitamins (VITAMIN B COMPLEX) TABS Take 1 tablet by mouth daily. 30 tablet 5   benzonatate (TESSALON) 100 MG capsule Take 1 capsule (100 mg total) by mouth 3 (three) times daily as needed for cough. 30 capsule 0   benztropine (COGENTIN) 0.5 MG tablet Take 0.5 mg by mouth 2 (two)  times daily.     diclofenac Sodium (VOLTAREN ARTHRITIS PAIN) 1 % GEL Apply 4 g topically 4 (four) times daily. 150 g 3   famciclovir (FAMVIR) 250 MG tablet Take 250 mg by mouth 2 (two) times daily.     Fluticasone-Umeclidin-Vilant (TRELEGY ELLIPTA) 100-62.5-25 MCG/ACT AEPB Inhale 1 puff into the lungs daily. 1 each 11   gabapentin (NEURONTIN) 400 MG capsule Take 1,200 mg by mouth 3 (three) times daily.     hydrOXYzine (ATARAX) 10 MG tablet Take 1-2 tablets (10-20 mg total) by mouth at bedtime as needed for anxiety (insomnia). 180 tablet 0   ibuprofen (ADVIL) 800 MG tablet Take 1 tablet (800 mg total) by mouth every 8 (eight) hours as needed for moderate pain. 30 tablet 2   ipratropium-albuterol (DUONEB) 0.5-2.5 (3) MG/3ML SOLN Take 3 mLs by nebulization every 4 (four) hours as needed. 360 mL 5   levocetirizine (XYZAL) 5 MG tablet Take 1 tablet (5 mg total) by mouth every evening. 30 tablet 3   loperamide (IMODIUM) 2 MG capsule 1 tab every 6-8 hours as needed diarrhea 30 capsule 1   magnesium oxide (MAG-OX) 400 MG tablet Take 250 mg by mouth daily.     methimazole (TAPAZOLE) 5 MG tablet Take 1 tablet (5 mg total) by mouth daily. 90 tablet 3   montelukast (SINGULAIR) 10 MG tablet Take 1 tablet (10 mg total) by mouth at bedtime. 90 tablet 3   NONFORMULARY OR COMPOUNDED ITEM Elevated toilet seat 2 each 0   olopatadine (PATANOL) 0.1 % ophthalmic solution Place 1 drop into both eyes 2 (two) times daily. 5 mL 3   oxcarbazepine (TRILEPTAL) 600 MG tablet  Take 600 mg by mouth 2 (two) times daily.     oxyCODONE (ROXICODONE) 5 MG immediate release tablet Take 1 tablet (5 mg total) by mouth 3 (three) times daily as needed for severe pain. 90 tablet 0   pantoprazole (PROTONIX) 40 MG tablet Take 40 mg by mouth daily.     potassium chloride SA (KLOR-CON) 20 MEQ tablet Take 1 tablet (20 mEq total) by mouth 2 (two) times daily for 7 days. 14 tablet 0   predniSONE (DELTASONE) 10 MG tablet Take 1 tablet (10 mg total) by mouth daily with breakfast. 30 tablet 0   ranitidine (ZANTAC) 300 MG capsule Take 300 mg by mouth every evening.     sodium chloride HYPERTONIC 3 % nebulizer solution 3 ml via neb twice daily 750 mL 12   traZODone (DESYREL) 50 MG tablet Take 50 mg by mouth at bedtime.     Vitamin D, Ergocalciferol, (DRISDOL) 1.25 MG (50000 UNIT) CAPS capsule TAKE ONE CAPSULE BY MOUTH ONCE WEEKLY FOR SIX WEEKS 6 capsule 0   No current facility-administered medications on file prior to visit.    BP (!) 142/84   Pulse 88   Temp 98.1 F (36.7 C) (Oral)   Resp (!) 22   Wt 174 lb (78.9 kg)   SpO2 95%   BMI 30.34 kg/m        Objective:   Physical Exam   General Mental Status- Alert. General Appearance- Not in acute distress.   Skin General: Color- Normal Color. Moisture- Normal Moisture.  Neck Carotid Arteries- Normal color. Moisture- Normal Moisture. No carotid bruits. No JVD.  Chest and Lung Exam Auscultation: Breath Sounds:-Normal.  Cardiovascular Auscultation:Rythm- Regular. Murmurs & Other Heart Sounds:Auscultation of the heart reveals- No Murmurs.  Abdomen Inspection:-Inspeection Normal. Palpation/Percussion:Note:No mass. Palpation and Percussion of the abdomen reveal- Non  Tender, Non Distended + BS, no rebound or guarding.   Neurologic Cranial Nerve exam:- CN III-XII intact(No nystagmus), symmetric smile. Strength:- 5/5 equal and symmetric strength both upper and lower extremities.      Assessment & Plan:   Patient  Instructions  1. Chronic respiratory failure with hypoxia (HCC) Follow up with pulmonlogist. Reviewed recent pulmonary notes. For mucus clearance continue mucinex. - For home use only DME oxygen  2. Stage 3 severe COPD by GOLD classification (HCC) - For home use only DME oxygen  3. Generalized abdominal pain mostly upper region will rx bentyl in event pain ibs type as you had before but also follow labs as precaution.  - Comp Met (CMET) - CBC w/Diff - Lipase  Follow up date to be determined after lab review.       Esperanza Richters, PA-C

## 2023-05-16 LAB — CBC WITH DIFFERENTIAL/PLATELET
Basophils Absolute: 0 10*3/uL (ref 0.0–0.1)
Basophils Relative: 0.6 % (ref 0.0–3.0)
Eosinophils Absolute: 0.1 10*3/uL (ref 0.0–0.7)
Eosinophils Relative: 1.4 % (ref 0.0–5.0)
HCT: 43.5 % (ref 36.0–46.0)
Hemoglobin: 14.3 g/dL (ref 12.0–15.0)
Lymphocytes Relative: 44.3 % (ref 12.0–46.0)
Lymphs Abs: 2.6 10*3/uL (ref 0.7–4.0)
MCHC: 32.8 g/dL (ref 30.0–36.0)
MCV: 98.5 fl (ref 78.0–100.0)
Monocytes Absolute: 0.6 10*3/uL (ref 0.1–1.0)
Monocytes Relative: 9.9 % (ref 3.0–12.0)
Neutro Abs: 2.6 10*3/uL (ref 1.4–7.7)
Neutrophils Relative %: 43.8 % (ref 43.0–77.0)
Platelets: 231 10*3/uL (ref 150.0–400.0)
RBC: 4.42 Mil/uL (ref 3.87–5.11)
RDW: 14.8 % (ref 11.5–15.5)
WBC: 5.9 10*3/uL (ref 4.0–10.5)

## 2023-05-16 LAB — COMPREHENSIVE METABOLIC PANEL
ALT: 11 U/L (ref 0–35)
AST: 18 U/L (ref 0–37)
Albumin: 3.9 g/dL (ref 3.5–5.2)
Alkaline Phosphatase: 66 U/L (ref 39–117)
BUN: 14 mg/dL (ref 6–23)
CO2: 28 mEq/L (ref 19–32)
Calcium: 8.8 mg/dL (ref 8.4–10.5)
Chloride: 103 mEq/L (ref 96–112)
Creatinine, Ser: 0.74 mg/dL (ref 0.40–1.20)
GFR: 81.31 mL/min (ref >60.00)
Glucose, Bld: 93 mg/dL (ref 70–99)
Potassium: 4.1 mEq/L (ref 3.5–5.1)
Sodium: 139 mEq/L (ref 135–145)
Total Bilirubin: 0.5 mg/dL (ref 0.2–1.2)
Total Protein: 6.6 g/dL (ref 6.0–8.3)

## 2023-05-16 LAB — LIPASE: Lipase: 17 U/L (ref 11.0–59.0)

## 2023-05-22 ENCOUNTER — Other Ambulatory Visit: Payer: Self-pay | Admitting: *Deleted

## 2023-05-22 DIAGNOSIS — J449 Chronic obstructive pulmonary disease, unspecified: Secondary | ICD-10-CM

## 2023-05-22 DIAGNOSIS — R911 Solitary pulmonary nodule: Secondary | ICD-10-CM

## 2023-05-22 NOTE — Progress Notes (Signed)
NEW PATIENT Date of Service/Encounter:  05/24/23 Referring provider: Marisue Brooklyn Primary care provider: Marisue Brooklyn  Subjective:  Jill Spence is a 72 y.o. female with a PMHx of stage III COPD,hyperthyroidism, vitamin D deficiency, guillain barre  presenting today for evaluation of chronic rhiniits. History obtained from: chart review and patient.  CONSULT ONLY_[facility]  Chronic rhinitis and conjuncitivitis:  started years ago Symptoms include:  burn and itch, constantly has eye drainage she also has runny and itchy nose. Sneezing. Burning sensation in throat. Occurs year-round with sneezing in summer. Treatments tried: she is using restasis-gel drops, olopatadine and an eye ointment but unsure name She is taking montelukast and levocetirizine daily.   These don't seem to help with her eyes. She does see an eye doctor.  Has had cataract surgery.  Eye doctor has told her she has signs of glaucoma that are currently controlled. Also told that she has bad allergies. Previous allergy testing: yes-years ago History of reflux/heartburn: yes-takes a reflux medications once a day and she doesn't feel it is controlled Previous sinus, ear, tonsil, adenoid surgeries: tonsillectomy and adenoidectomy Has had her nose cauterized-last around 2-3 years ago.  She has been told she has IBS-D and COPD. Follows with Dr. Isaiah Serge in pulmonary for COPD. Taking Trelegy.   Uses albuterol multiple times per week. On oxygen, 3-4 L daily.  Chart Review: PCP referral notes: 04/12/23 "Pt has hx of allergies. Use xyzal, patonol and montelukast. She is stating needs to place plastic covering on all windows to help with allergies so wanted allergist evaluation and treatment. "  Chest CT 05/10/23:  No evidence of interstitial lung disease. 2. Multiple pulmonary nodules, increased in number and size compared to the prior study, with at least 2 nodules concerning for potential malignancy, as  detailed above. Further evaluation with PET-CT could be considered, particularly for assessment of the left upper lobe pulmonary nodule. Alternatively, follow-up non-contrast chest CT could be considered in 3 months. 3. Diffuse bronchial wall thickening with severe centrilobular and paraseptal emphysema; imaging findings suggestive of underlying COPD. 4. Aortic atherosclerosis, in addition to left main and 2 vessel coronary artery disease. Assessment for potential risk factor modification, dietary therapy or pharmacologic therapy may be warranted, if clinically indicated.  Past Medical History: Past Medical History:  Diagnosis Date   Anemia    low iron   Anxiety    Arthritis    Bipolar disorder (HCC)    Cancer (HCC) 1973   COPD (chronic obstructive pulmonary disease) (HCC)    Family history of adverse reaction to anesthesia    mom "was put to sleep and she never woke up" -    GERD (gastroesophageal reflux disease)    Guillain Barr syndrome (HCC)    numbness in toes, legs hurt   Headache    migraines as a teenager   IBS (irritable bowel syndrome)    Lump, breast    removed years ago per Pt.   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, has "quiet' seizures   Thyroid disease    Medication List:  Current Outpatient Medications  Medication Sig Dispense Refill   albuterol (PROVENTIL HFA;VENTOLIN HFA) 108 (90 BASE) MCG/ACT inhaler Inhale 2 puffs into the lungs every 6 (six) hours as needed for wheezing.     albuterol (PROVENTIL) (2.5 MG/3ML) 0.083% nebulizer solution Take 2.5 mg by nebulization every  6 (six) hours as needed for wheezing.     alendronate (FOSAMAX) 70 MG tablet Take 70 mg by mouth every Sunday. Take with a full glass of water on an empty stomach.     ARIPiprazole (ABILIFY) 15 MG tablet Take 15 mg by mouth daily.     B Complex Vitamins (VITAMIN B COMPLEX) TABS Take 1 tablet by mouth daily. 30  tablet 5   benzonatate (TESSALON) 100 MG capsule Take 1 capsule (100 mg total) by mouth 3 (three) times daily as needed for cough. 30 capsule 0   Bepotastine Besilate (BEPREVE) 1.5 % SOLN Place 1 drop into both eyes 2 (two) times daily as needed. 10 mL 5   diclofenac Sodium (VOLTAREN ARTHRITIS PAIN) 1 % GEL Apply 4 g topically 4 (four) times daily. 150 g 3   doxycycline (VIBRA-TABS) 100 MG tablet Take 100 mg by mouth daily.     famciclovir (FAMVIR) 250 MG tablet Take 250 mg by mouth 2 (two) times daily.     Fluticasone-Umeclidin-Vilant (TRELEGY ELLIPTA) 100-62.5-25 MCG/ACT AEPB Inhale 1 puff into the lungs daily. 1 each 11   gabapentin (NEURONTIN) 400 MG capsule Take 1,200 mg by mouth 3 (three) times daily.     hydrOXYzine (ATARAX) 10 MG tablet Take 1-2 tablets (10-20 mg total) by mouth at bedtime as needed for anxiety (insomnia). 180 tablet 0   ibuprofen (ADVIL) 800 MG tablet Take 1 tablet (800 mg total) by mouth every 8 (eight) hours as needed for moderate pain. 30 tablet 2   ipratropium-albuterol (DUONEB) 0.5-2.5 (3) MG/3ML SOLN Take 3 mLs by nebulization every 4 (four) hours as needed. 360 mL 5   levocetirizine (XYZAL) 5 MG tablet Take 1 tablet (5 mg total) by mouth every evening. 30 tablet 3   loperamide (IMODIUM) 2 MG capsule Take by mouth.     magnesium oxide (MAG-OX) 400 MG tablet Take 250 mg by mouth daily.     methimazole (TAPAZOLE) 5 MG tablet Take 1 tablet (5 mg total) by mouth daily. 90 tablet 3   montelukast (SINGULAIR) 10 MG tablet Take 1 tablet (10 mg total) by mouth at bedtime. 90 tablet 3   NONFORMULARY OR COMPOUNDED ITEM Elevated toilet seat 2 each 0   olopatadine (PATANOL) 0.1 % ophthalmic solution Place 1 drop into both eyes 2 (two) times daily. 5 mL 3   oxcarbazepine (TRILEPTAL) 600 MG tablet Take 600 mg by mouth 2 (two) times daily.     oxyCODONE (ROXICODONE) 5 MG immediate release tablet Take 1 tablet (5 mg total) by mouth 3 (three) times daily as needed for severe pain. 90  tablet 0   pantoprazole (PROTONIX) 40 MG tablet Take 40 mg by mouth daily.     potassium chloride SA (KLOR-CON) 20 MEQ tablet Take 1 tablet (20 mEq total) by mouth 2 (two) times daily for 7 days. 14 tablet 0   predniSONE (DELTASONE) 10 MG tablet Take 1 tablet (10 mg total) by mouth daily with breakfast. 30 tablet 0   ranitidine (ZANTAC) 300 MG capsule Take 300 mg by mouth every evening.     sodium chloride HYPERTONIC 3 % nebulizer solution 3 ml via neb twice daily 750 mL 12   traZODone (DESYREL) 50 MG tablet Take 50 mg by mouth at bedtime.     Vitamin D, Ergocalciferol, (DRISDOL) 1.25 MG (50000 UNIT) CAPS capsule TAKE ONE CAPSULE BY MOUTH ONCE WEEKLY FOR SIX WEEKS 6 capsule 0   benztropine (COGENTIN) 0.5 MG tablet Take 0.5 mg by  mouth 2 (two) times daily. (Patient not taking: Reported on 05/24/2023)     dicyclomine (BENTYL) 10 MG capsule Take 1 capsule (10 mg total) by mouth 3 (three) times daily before meals. (Patient not taking: Reported on 05/24/2023) 21 capsule 0   hydrOXYzine (ATARAX) 25 MG tablet Take 25 mg by mouth 2 (two) times daily as needed.     loperamide (IMODIUM) 2 MG capsule 1 tab every 6-8 hours as needed diarrhea (Patient not taking: Reported on 05/24/2023) 30 capsule 1   No current facility-administered medications for this visit.   Known Allergies:  Allergies  Allergen Reactions   Influenza Vaccines    Aspirin     Other Reaction(s): GI Intolerance   Past Surgical History: Past Surgical History:  Procedure Laterality Date   BREAST SURGERY     COLONOSCOPY     EYE SURGERY Bilateral    cataract surgery with lens implants   MANDIBLE RECONSTRUCTION     MINOR REMOVAL OF MANDIBULAR HARDWARE Right 10/15/2015   Procedure: MINOR REMOVAL OF Right MANDIBULAR HARDWARE;  Surgeon: Ocie Doyne, DDS;  Location: [facility] OR;  Service: Oral Surgery;  Laterality: Right;   TONSILLECTOMY     Family History: Family History  Problem Relation Age of Onset   Cancer Father    Social History:  Zakera lives in an apartment built 14 years ago, no water damage, carpets in the bedroom, Architectural technologist, central AC, indoor dog, no roaches, no DM protection on bedding or pillows, disabled, + HEPA filter, [place].   ROS:  All other systems negative except as noted per HPI.  Objective:  Blood pressure 132/88, pulse 95, temperature 98.7 F (37.1 C), temperature source Temporal, resp. rate 20, height 5' 3.5" (1.613 m), weight 175 lb (79.4 kg), SpO2 (!) 89 %. Body mass index is 30.51 kg/m. Physical Exam:  General Appearance:  Alert, cooperative, no distress, appears stated age  Head:  Normocephalic, without obvious abnormality, atraumatic  Eyes:  Conjunctiva clear, EOM's intact  Nose: Nares normal,  nasal cannula in place, receiving 4 L/min  Throat: Lips, tongue normal; teeth and gums normal, normal posterior oropharynx and surgically absent tonsils  Neck: Supple, symmetrical  Lungs:   Prolonged expiratory phase and clear to auscultation bilaterally, Respirations unlabored, no coughing  Heart:  regular rate and rhythm and no murmur, Appears well perfused  Extremities: No edema  Skin: Skin color, texture, turgor normal and no rashes or lesions on visualized portions of skin  Neurologic: No gross deficits   Diagnostics: Labs-see below  Assessment and Plan  Chronic Conjunctivitis:  - Start Allergy Eye drops-Bepreve 1 drop each eye up to twice daily. This will replace patanol. Continue Restasis eye drops.  Continue follow-up with opthalmologic.  -Avoid eye drops that say red eye relief as they may contain medications that dry out your eyes.  Chronic Rhinitis:  - allergy testing today: via blood work - Prevention:  - Symptom control: - Continue Singulair (Montelukast) 10mg  nightly.   - Discontinue if nightmares of behavior changes. - Continue Xyzal (Levocetirinze) 5mg  daily as needed.  COPD on oxygen:  Continue to follow- with pulmonary   Follow  up : pending lab results. Will contact you once results available for review. It was a pleasure meeting you in clinic today! Thank you for allowing me to participate in your care.  This note in its entirety was forwarded to the Provider who requested this consultation.  Thank you for your kind referral. I appreciate the  opportunity to take part in [place] care. Please do not hesitate to contact me with questions.  Sincerely,  Tonny Bollman, MD [facility]

## 2023-05-24 ENCOUNTER — Ambulatory Visit (INDEPENDENT_AMBULATORY_CARE_PROVIDER_SITE_OTHER): Payer: 59 | Admitting: Internal Medicine

## 2023-05-24 ENCOUNTER — Encounter: Payer: Self-pay | Admitting: Internal Medicine

## 2023-05-24 ENCOUNTER — Other Ambulatory Visit: Payer: Self-pay

## 2023-05-24 VITALS — BP 132/88 | HR 95 | Temp 98.7°F | Resp 20 | Ht 63.5 in | Wt 175.0 lb

## 2023-05-24 DIAGNOSIS — H10403 Unspecified chronic conjunctivitis, bilateral: Secondary | ICD-10-CM | POA: Diagnosis not present

## 2023-05-24 DIAGNOSIS — Z9981 Dependence on supplemental oxygen: Secondary | ICD-10-CM

## 2023-05-24 DIAGNOSIS — J438 Other emphysema: Secondary | ICD-10-CM | POA: Diagnosis not present

## 2023-05-24 DIAGNOSIS — J310 Chronic rhinitis: Secondary | ICD-10-CM

## 2023-05-24 MED ORDER — BEPOTASTINE BESILATE 1.5 % OP SOLN: 1.0000 [drp] | Freq: Two times a day (BID) | OPHTHALMIC | 5 refills | Status: DC | PRN

## 2023-05-24 NOTE — Patient Instructions (Signed)
Chronic Conjunctivitis:  - Start Allergy Eye drops-Bepreve 1 drop each eye up to twice daily. This will replace patanol. Continue Restasis eye drops.  Continue follow-up with opthalmologic.  -Avoid eye drops that say red eye relief as they may contain medications that dry out your eyes.  Chronic Rhinitis:  - allergy testing today: via blood work - Prevention:  - Symptom control: - Continue Singulair (Montelukast) 10mg  nightly.   - Discontinue if nightmares of behavior changes. - Continue Xyzal (Levocetirinze) 5mg  daily as needed.  Follow up : pending lab results. Will contact you once results available for review. It was a pleasure meeting you in clinic today! Thank you for allowing me to participate in your care.  Tonny Bollman, MD [facility]

## 2023-05-27 ENCOUNTER — Other Ambulatory Visit: Payer: Self-pay | Admitting: Pulmonary Disease

## 2023-05-27 LAB — ALLERGENS, ZONE 2

## 2023-05-28 ENCOUNTER — Telehealth: Payer: Self-pay | Admitting: Medical

## 2023-05-28 MED ORDER — PANTOPRAZOLE SODIUM 40 MG PO TBEC: 40.0000 mg | DELAYED_RELEASE_TABLET | Freq: Every day | ORAL | 3 refills | Status: DC

## 2023-05-28 NOTE — Addendum Note (Signed)
Addended by: Gwenevere Abbot on: 05/28/2023 09:31 PM   Modules accepted: Orders

## 2023-05-28 NOTE — Progress Notes (Signed)
Please let Mrs. Tholl know that her allergy testing is negative. This means that her symptoms are not due to allergies, but rather nonallergic rhinitis and conjunctivitis. I would encourage her to continue the medications as discussed if they are helpful.  She can follow-up if needed.

## 2023-05-28 NOTE — Telephone Encounter (Signed)
Patient called and needs refill sent to  [facility]  [address]  [place] [address]

## 2023-05-28 NOTE — Telephone Encounter (Signed)
Prescription Request  05/28/2023  Is this a "Controlled Substance" medicine? No  LOV: 05/15/2023  What is the name of the medication or equipment?   pantoprazole (PROTONIX) 40 MG tablet [[id number]]   Have you contacted your pharmacy to request a refill? No   Which pharmacy would you like this sent to?   [id number] - [place], [place] - [address], [address] [facility] [place] [address] Phone: [phone] Fax: [phone]    Patient notified that their request is being sent to the clinical staff for review and that they should receive a response within 2 business days.   Please advise at Mobile [phone] (mobile)

## 2023-06-01 ENCOUNTER — Other Ambulatory Visit: Payer: Self-pay | Admitting: Medical

## 2023-06-05 ENCOUNTER — Telehealth: Payer: Self-pay | Admitting: Medical

## 2023-06-05 MED ORDER — LEVOCETIRIZINE DIHYDROCHLORIDE 5 MG PO TABS: 5.0000 mg | ORAL_TABLET | Freq: Every evening | ORAL | 3 refills | Status: DC

## 2023-06-05 MED ORDER — FAMCICLOVIR 250 MG PO TABS: 250.0000 mg | ORAL_TABLET | Freq: Two times a day (BID) | ORAL | 2 refills | Status: DC

## 2023-06-05 MED ORDER — IBUPROFEN 800 MG PO TABS: 800.0000 mg | ORAL_TABLET | Freq: Three times a day (TID) | ORAL | 2 refills | Status: DC | PRN

## 2023-06-05 NOTE — Telephone Encounter (Signed)
Rx sent 

## 2023-06-05 NOTE — Telephone Encounter (Signed)
Medication: levocetirizine (XYZAL) 5 MG tablet  ibuprofen (ADVIL) 800 MG tablet  famciclovir (FAMVIR) 250 MG tablet (pt is aware [facility] has not filled this before)   Has the patient contacted their pharmacy? No.   Preferred Pharmacy:   [facility] [address], [place] - [address] [phone] [address] [place] [address] Phone: [phone]  Fax: [phone]

## 2023-06-06 ENCOUNTER — Ambulatory Visit (INDEPENDENT_AMBULATORY_CARE_PROVIDER_SITE_OTHER): Payer: 59 | Admitting: Pulmonary Disease

## 2023-06-06 ENCOUNTER — Encounter (HOSPITAL_COMMUNITY)
Admission: RE | Admit: 2023-06-06 | Discharge: 2023-06-06 | Disposition: A | Discharge status: Home / Self Care | Payer: 59 | Source: Ambulatory Visit | Attending: Pulmonary Disease | Admitting: Pulmonary Disease

## 2023-06-06 DIAGNOSIS — R911 Solitary pulmonary nodule: Secondary | ICD-10-CM | POA: Insufficient documentation

## 2023-06-06 DIAGNOSIS — J449 Chronic obstructive pulmonary disease, unspecified: Secondary | ICD-10-CM | POA: Diagnosis not present

## 2023-06-06 LAB — PULMONARY FUNCTION TEST
DL/VA % pred: 33 %
DL/VA: 1.38 ml/min/mmHg/L
DLCO cor % pred: 21 %
DLCO cor: 4.1 ml/min/mmHg
DLCO unc % pred: 22 %
DLCO unc: 4.21 ml/min/mmHg
FEF 25-75 Post: 0.27 L/sec
FEF 25-75 Pre: 0.32 L/sec
FEF2575-%Change-Post: -16 %
FEF2575-%Pred-Post: 14 %
FEF2575-%Pred-Pre: 17 %
FEV1-%Change-Post: -12 %
FEV1-%Pred-Post: 28 %
FEV1-%Pred-Pre: 32 %
FEV1-Post: 0.61 L
FEV1-Pre: 0.7 L
FEV1FVC-%Change-Post: -7 %
FEV1FVC-%Pred-Pre: 54 %
FEV6-%Change-Post: -6 %
FEV6-%Pred-Post: 56 %
FEV6-%Pred-Pre: 59 %
FEV6-Post: 1.52 L
FEV6-Pre: 1.62 L
FEV6FVC-%Change-Post: -1 %
FEV6FVC-%Pred-Post: 98 %
FEV6FVC-%Pred-Pre: 99 %
FVC-%Change-Post: -5 %
FVC-%Pred-Post: 56 %
FVC-%Pred-Pre: 60 %
FVC-Post: 1.62 L
FVC-Pre: 1.7 L
Post FEV1/FVC ratio: 38 %
Post FEV6/FVC ratio: 94 %
Pre FEV1/FVC ratio: 41 %
Pre FEV6/FVC Ratio: 95 %
RV % pred: 232 %
RV: 5.01 L
TLC % pred: 141 %
TLC: 6.92 L

## 2023-06-06 LAB — GLUCOSE, CAPILLARY: Glucose-Capillary: 94 mg/dL (ref 70–99)

## 2023-06-06 MED ORDER — FLUDEOXYGLUCOSE F - 18 (FDG) INJECTION
8.5400 | Freq: Once | INTRAVENOUS | Status: AC
  Administered 2023-06-06: 8.54 via INTRAVENOUS

## 2023-06-06 NOTE — Progress Notes (Signed)
Full PFT performed today. °

## 2023-06-06 NOTE — Patient Instructions (Signed)
Full PFT performed today. °

## 2023-06-12 ENCOUNTER — Encounter: Payer: Self-pay | Admitting: Pulmonary Disease

## 2023-06-12 ENCOUNTER — Ambulatory Visit (INDEPENDENT_AMBULATORY_CARE_PROVIDER_SITE_OTHER): Payer: 59 | Admitting: Pulmonary Disease

## 2023-06-12 VITALS — BP 118/64 | HR 83 | Temp 97.6°F | Ht 63.5 in | Wt 172.8 lb

## 2023-06-12 DIAGNOSIS — J4489 Other specified chronic obstructive pulmonary disease: Secondary | ICD-10-CM

## 2023-06-12 DIAGNOSIS — J439 Emphysema, unspecified: Secondary | ICD-10-CM | POA: Diagnosis not present

## 2023-06-12 NOTE — Progress Notes (Signed)
Jill Spence    [id number]    [birth year]  Primary Care Physician:Saguier, Kateri Mc  Referring Physician: Esperanza Richters, PA-C [address] [place],  [place] [address]  Chief complaint: Follow-up for COPD  HPI: 72 y.o. with history of COPD on supplemental oxygen, lung nodules.   Maintained on Breo and now Trelegy inhaler.  She has had recurrent exacerbations over the past year requiring prednisone, antibiotics. She is also being followed with serial CT scans for lung nodule  States that she was hospitalized for COVID-19 at [facility] in early 2021 but I do not have any record of those. Diagnosed with multinodular goiter and hypothyroidism in 2022.  She is following with endocrinology and is on methimazole  2022 She had a CT chest and PET scan for evaluation of lung nodules PET scan showed hypermetabolic them in the right axilla.  She has been seen at dermatology and this lesion excised.  I have reviewed the previous notes from dermatology As per the patient and the final biopsy report from September 15 is benign keloid  She also had a follow-up CT chest which showed improvement in lung nodule  Pets: Has a dog Occupation: Retired Midwife Exposures: Reports mold in the previous apartment.  No current mold exposure.  No hot tub, Jacuzzi Smoking history: States that she quit smoking in 2008.  Cannot give me a clear idea of how much she smoked prior Travel history: No significant travel history Relevant family history: No significant family history of lung disease  Interim history: Complains of worsening dyspnea on exertion with cough and mucus production. Chief complaint today is increasing mucus production which is yellow in color and feeling of choking in the throat At last Trelegy was changed to [facility] but she did not like it and is back to Trelegy  She had a CT scan which showed increasing lung nodules and PET scan was ordered.   Final results are pending   Outpatient Encounter Medications as of 06/12/2023  Medication Sig   albuterol (PROVENTIL HFA;VENTOLIN HFA) 108 (90 BASE) MCG/ACT inhaler Inhale 2 puffs into the lungs every 6 (six) hours as needed for wheezing.   albuterol (PROVENTIL) (2.5 MG/3ML) 0.083% nebulizer solution Take 2.5 mg by nebulization every 6 (six) hours as needed for wheezing.   alendronate (FOSAMAX) 70 MG tablet Take 70 mg by mouth every Sunday. Take with a full glass of water on an empty stomach.   ARIPiprazole (ABILIFY) 15 MG tablet Take 15 mg by mouth daily.   B Complex Vitamins (VITAMIN B COMPLEX) TABS Take 1 tablet by mouth daily.   benzonatate (TESSALON) 100 MG capsule Take 1 capsule (100 mg total) by mouth 3 (three) times daily as needed for cough.   benztropine (COGENTIN) 0.5 MG tablet Take 0.5 mg by mouth 2 (two) times daily. (Patient not taking: Reported on 05/24/2023)   Bepotastine Besilate (BEPREVE) 1.5 % SOLN Place 1 drop into both eyes 2 (two) times daily as needed.   diclofenac Sodium (VOLTAREN ARTHRITIS PAIN) 1 % GEL Apply 4 g topically 4 (four) times daily.   dicyclomine (BENTYL) 10 MG capsule Take 1 capsule (10 mg total) by mouth 3 (three) times daily before meals. (Patient not taking: Reported on 05/24/2023)   doxycycline (VIBRA-TABS) 100 MG tablet Take 100 mg by mouth daily.   famciclovir (FAMVIR) 250 MG tablet Take 1 tablet (250 mg total) by mouth 2 (two) times daily.  Fluticasone-Umeclidin-Vilant (TRELEGY ELLIPTA) 100-62.5-25 MCG/ACT AEPB Inhale 1 puff into the lungs daily.   gabapentin (NEURONTIN) 400 MG capsule Take 1,200 mg by mouth 3 (three) times daily.   hydrOXYzine (ATARAX) 10 MG tablet Take 1-2 tablets (10-20 mg total) by mouth at bedtime as needed for anxiety (insomnia).   hydrOXYzine (ATARAX) 25 MG tablet Take 25 mg by mouth 2 (two) times daily as needed.   ibuprofen (ADVIL) 800 MG tablet Take 1 tablet (800 mg total) by mouth every 8 (eight) hours as needed for moderate  pain.   ipratropium-albuterol (DUONEB) 0.5-2.5 (3) MG/3ML SOLN Take 3 mLs by nebulization every 4 (four) hours as needed.   levocetirizine (XYZAL) 5 MG tablet Take 1 tablet (5 mg total) by mouth every evening.   loperamide (IMODIUM) 2 MG capsule 1 tab every 6-8 hours as needed diarrhea (Patient not taking: Reported on 05/24/2023)   loperamide (IMODIUM) 2 MG capsule Take by mouth.   magnesium oxide (MAG-OX) 400 MG tablet Take 250 mg by mouth daily.   methimazole (TAPAZOLE) 5 MG tablet Take 1 tablet (5 mg total) by mouth daily.   montelukast (SINGULAIR) 10 MG tablet Take 1 tablet (10 mg total) by mouth at bedtime.   NONFORMULARY OR COMPOUNDED ITEM Elevated toilet seat   olopatadine (PATANOL) 0.1 % ophthalmic solution Place 1 drop into both eyes 2 (two) times daily.   oxcarbazepine (TRILEPTAL) 600 MG tablet Take 600 mg by mouth 2 (two) times daily.   oxyCODONE (ROXICODONE) 5 MG immediate release tablet Take 1 tablet (5 mg total) by mouth 3 (three) times daily as needed for severe pain.   pantoprazole (PROTONIX) 40 MG tablet Take 1 tablet (40 mg total) by mouth daily.   potassium chloride SA (KLOR-CON) 20 MEQ tablet Take 1 tablet (20 mEq total) by mouth 2 (two) times daily for 7 days.   predniSONE (DELTASONE) 10 MG tablet TAKE 1 TABLET (10 MG TOTAL) BY MOUTH DAILY WITH BREAKFAST.   ranitidine (ZANTAC) 300 MG capsule Take 300 mg by mouth every evening.   sodium chloride HYPERTONIC 3 % nebulizer solution 3 ml via neb twice daily   traZODone (DESYREL) 50 MG tablet Take 50 mg by mouth at bedtime.   Vitamin D, Ergocalciferol, (DRISDOL) 1.25 MG (50000 UNIT) CAPS capsule TAKE ONE CAPSULE BY MOUTH ONCE WEEKLY FOR SIX WEEKS   No facility-administered encounter medications on file as of 06/12/2023.   Physical Exam: Blood pressure 116/72, pulse 76, temperature 97.9 F (36.6 C), temperature source Oral, weight 174 lb (78.9 kg), SpO2 96 %. Gen:      No acute distress HEENT:  EOMI, sclera anicteric Neck:      No masses; no thyromegaly Lungs:    Clear to auscultation bilaterally; normal respiratory effort CV:         Regular rate and rhythm; no murmurs Abd:      + bowel sounds; soft, non-tender; no palpable masses, no distension Ext:    No edema; adequate peripheral perfusion Skin:      Warm and dry; no rash Neuro: alert and oriented x 3 Psych: normal mood and affect   Data Reviewed: Imaging: CT chest 03/29/2020-cirrhosis, thyroid nodule, severe emphysema  A new 4 mm nodule is seen along the minor fissure (3/61). 6 mm nodule in the lateral  segment right middle lobe (3/69) has enlarged from 3 mm on  01/01/2019. 2 mm anterior right lower lobe nodule (3/76) is new.  Scarring in the apex of the left upper lobe with a  new nodular  component measuring 6 mm (3/12). 3 mm lingular nodule (3/63) is new.  Additional tiny pulmonary nodules in the left lung are stable. No  pleural fluid. Airway is unremarkable.   CT chest 07/14/2020-new 5 mm nodule in the right middle lobe.  Remainder of the lung nodules are stable.  Enlarged pulmonary trunk  CT chest 10/20/2020-stable right middle lobe nodule, thyroid nodule, aortic atherosclerosis, emphysema  CT chest 04/14/2021-interval development of pulmonary nodules  PET scan 04/29/2021-12 mm lesion along the left anterior shoulder/axilla.  Multiple bilateral nodules up to 10 mm.  Super D CT 08/02/2021-recently noted pulm nodules are stable to decreased in size.  CT chest 04/10/2022-stable pulm nodules, severe emphysema, coronary artery disease  CT chest 05/17/2023-no interstitial lung disease, multiple lung nodules increase in size, severe emphysema, aortic atherosclerosis, coronary artery disease  PET scan 06/06/2023-pending I have reviewed the images personally.  PFTs: 01/26/2021 FVC 1.68 [73%], FEV1 0.72 [40%], TLC 6.19 [126%], DLCO 5.09 [26%] Severe obstruction with air trapping and hyperinflation, severe diffusion defect  06/06/2023 FVC 1.62 [56%], FEV1 0.61  [28%], F/F38, TLC 6.92 [141%], DLCO 4.21 [22%] Severe obstruction with hyperinflation and diffusion defect  Labs: CBC 07/27/2020-WBC 7.3, eos 1%, absolute eosinophil count 73 IgE 07/27/2020-5  Alpha-1 antitrypsin 08/28/2028-170, PIMM  Assessment:  Severe COPD with exacerbations Has worsening dyspnea with chest congestion.  She feels that her current treatment regimen is not working for her. Echo is normal with no evidence of interstitial lung disease on CT scan PFTs do show worsening obstruction and I suspect her presentation is just due to COPD  Did not like Breztri and is back on Trelegy Continue duo nebs Mucinex, flutter valve for mucociliary clearance, add percussion vest She is continuing with physical therapy.  Declined pulmonary rehab. We discussed possible lung volume reduction surgery but she does not want to go through that  Pulmonary nodules Increase in size on last CT scan.  She had a PET scan done and final read is pending  Abnormal PET scan PET scan in 2022 showed hypermetabolic lesion along the left shoulder/axilla.  She had a skin lesion which was excised by dermatology and is reportedly a benign keloid.   Plan/Recommendations: Continue Trelegy Mucociliary clearance with Mucinex, flutter valve, percussion vest Call patient with results of PET scan  Chilton Greathouse MD [facility] 06/12/2023, 1:56 PM  CC: Esperanza Richters, PA-C

## 2023-06-12 NOTE — Patient Instructions (Signed)
Continue the Trelegy inhaler, flutter valve and Mucinex I will call with results of your PET scan Return to clinic in 2 months

## 2023-06-13 ENCOUNTER — Other Ambulatory Visit: Payer: Self-pay

## 2023-06-13 ENCOUNTER — Other Ambulatory Visit: Payer: Self-pay | Admitting: Medical

## 2023-06-13 ENCOUNTER — Telehealth: Payer: Self-pay

## 2023-06-13 ENCOUNTER — Other Ambulatory Visit: Payer: Self-pay | Admitting: Pulmonary Disease

## 2023-06-13 MED ORDER — METHIMAZOLE 5 MG PO TABS: 5.0000 mg | ORAL_TABLET | Freq: Every day | ORAL | 1 refills | Status: DC

## 2023-06-13 MED ORDER — OXYCODONE HCL 5 MG PO TABS: 5.0000 mg | ORAL_TABLET | Freq: Three times a day (TID) | ORAL | 0 refills | Status: DC | PRN

## 2023-06-13 NOTE — Telephone Encounter (Signed)
Patient has been notified

## 2023-06-13 NOTE — Telephone Encounter (Signed)
PMP was reviewed.  Oxycodone was e-scribed today, call placed to [name], she verbalizes understanding.

## 2023-06-13 NOTE — Telephone Encounter (Signed)
Received a vm from [place]. She states that this patient had a scan done and they noticed some thyroid activity. She wants to know does Ms,Jill Spence need to be seen sooner than the appt in August?

## 2023-06-13 NOTE — Telephone Encounter (Signed)
J, We know about the nodule and there are no concerns about it especially as we biopsied it and the results were benign.  We can continue to follow this expectantly for now.

## 2023-06-14 ENCOUNTER — Other Ambulatory Visit: Payer: Self-pay | Admitting: *Deleted

## 2023-06-14 DIAGNOSIS — R911 Solitary pulmonary nodule: Secondary | ICD-10-CM

## 2023-06-25 ENCOUNTER — Other Ambulatory Visit: Payer: Self-pay | Admitting: Medical

## 2023-06-26 ENCOUNTER — Encounter: Payer: 59 | Attending: Physical Medicine and Rehabilitation | Admitting: Registered Nurse

## 2023-06-26 VITALS — BP 129/87 | HR 85 | Ht 63.5 in | Wt 173.0 lb

## 2023-06-26 DIAGNOSIS — G610 Guillain-Barre syndrome: Secondary | ICD-10-CM | POA: Diagnosis present

## 2023-06-26 DIAGNOSIS — Z5181 Encounter for therapeutic drug level monitoring: Secondary | ICD-10-CM | POA: Insufficient documentation

## 2023-06-26 DIAGNOSIS — G8929 Other chronic pain: Secondary | ICD-10-CM | POA: Insufficient documentation

## 2023-06-26 DIAGNOSIS — M25561 Pain in right knee: Secondary | ICD-10-CM | POA: Diagnosis present

## 2023-06-26 DIAGNOSIS — M2550 Pain in unspecified joint: Secondary | ICD-10-CM | POA: Insufficient documentation

## 2023-06-26 DIAGNOSIS — G894 Chronic pain syndrome: Secondary | ICD-10-CM | POA: Insufficient documentation

## 2023-06-26 DIAGNOSIS — Z79891 Long term (current) use of opiate analgesic: Secondary | ICD-10-CM | POA: Insufficient documentation

## 2023-06-26 DIAGNOSIS — M545 Low back pain, unspecified: Secondary | ICD-10-CM | POA: Insufficient documentation

## 2023-06-26 NOTE — Progress Notes (Signed)
Subjective:    Patient ID: Jill Spence, female    DOB: 1950/12/06, 72 y.o.   MRN: [id number]  HPI: Jill Spence is a 72 y.o. female who returns for follow up appointment for chronic pain and medication refill. states *** pain is located in  ***. rates pain ***. current exercise regime is walking and performing stretching exercises.   Ms. Guzzetta Morphine equivalent is *** MME.   UDS ordered today.     Pain Inventory Average Pain 10 Pain Right Now 8 My pain is aching and throbbing  In the last 24 hours, has pain interfered with the following? General activity 7 Relation with others 7 Enjoyment of life 7 What TIME of day is your pain at its worst? night Sleep (in general) Fair  Pain is worse with: walking, sitting, standing, and some activites Pain improves with: pacing activities and medication Relief from Meds: 6  Family History  Problem Relation Age of Onset   Cancer Father    Social History   Socioeconomic History   Marital status: Widowed    Spouse name: Not on file   Number of children: Not on file   Years of education: Not on file   Highest education level: Some college, no degree  Occupational History   Not on file  Tobacco Use   Smoking status: Former    Current packs/day: 0.00    Average packs/day: 0.3 packs/day for 6.0 years (1.5 ttl pk-yrs)    Types: Cigarettes    Start date: 10/13/2001    Quit date: 10/14/2007    Years since quitting: 15.7   Smokeless tobacco: Never  Vaping Use   Vaping status: Never Used  Substance and Sexual Activity   Alcohol use: No   Drug use: No   Sexual activity: Not on file  Other Topics Concern   Not on file  Social History Narrative   Not on file   Social Determinants of Health   Financial Resource Strain: Medium Risk (04/25/2023)   Overall Financial Resource Strain (CARDIA)    Difficulty of Paying Living Expenses: Somewhat hard  Food Insecurity: Food Insecurity Present (04/25/2023)   Hunger Vital Sign     Worried About Running Out of Food in the Last Year: Sometimes true    Ran Out of Food in the Last Year: Sometimes true  Transportation Needs: No Transportation Needs (04/25/2023)   PRAPARE - Administrator, Civil Service (Medical): No    Lack of Transportation (Non-Medical): No  Physical Activity: Insufficiently Active (04/25/2023)   Exercise Vital Sign    Days of Exercise per Week: 1 day    Minutes of Exercise per Session: 30 min  Stress: Stress Concern Present (04/25/2023)   [employer] of Occupational Health - Occupational Stress Questionnaire    Feeling of Stress : To some extent  Social Connections: Moderately Isolated (04/25/2023)   Social Connection and Isolation Panel [NHANES]    Frequency of Communication with Friends and Family: More than three times a week    Frequency of Social Gatherings with Friends and Family: Once a week    Attends Religious Services: 1 to 4 times per year    Active Member of [employer] or Organizations: No    Attends Banker Meetings: Never    Marital Status: Widowed   Past Surgical History:  Procedure Laterality Date   BREAST SURGERY     COLONOSCOPY     EYE SURGERY Bilateral    cataract surgery with  lens implants   MANDIBLE RECONSTRUCTION     MINOR REMOVAL OF MANDIBULAR HARDWARE Right 10/15/2015   Procedure: MINOR REMOVAL OF Right MANDIBULAR HARDWARE;  Surgeon: Ocie Doyne, DDS;  Location: [facility] OR;  Service: Oral Surgery;  Laterality: Right;   TONSILLECTOMY     Past Surgical History:  Procedure Laterality Date   BREAST SURGERY     COLONOSCOPY     EYE SURGERY Bilateral    cataract surgery with lens implants   MANDIBLE RECONSTRUCTION     MINOR REMOVAL OF MANDIBULAR HARDWARE Right 10/15/2015   Procedure: MINOR REMOVAL OF Right MANDIBULAR HARDWARE;  Surgeon: Ocie Doyne, DDS;  Location: [facility] OR;  Service: Oral Surgery;  Laterality: Right;   TONSILLECTOMY     Past Medical History:  Diagnosis Date   Anemia    low iron    Anxiety    Arthritis    Bipolar disorder (HCC)    Cancer (HCC) 1973   COPD (chronic obstructive pulmonary disease) (HCC)    Family history of adverse reaction to anesthesia    mom "was put to sleep and she never woke up" -    GERD (gastroesophageal reflux disease)    Guillain Barr syndrome (HCC)    numbness in toes, legs hurt   Headache    migraines as a teenager   IBS (irritable bowel syndrome)    Lump, breast    removed years ago per Pt.   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, has "quiet' seizures   Thyroid disease    BP 129/87   Pulse 85   Ht 5' 3.5" (1.613 m)   Wt 173 lb (78.5 kg)   SpO2 96%   BMI 30.16 kg/m   Opioid Risk Score:   Fall Risk Score:  `1  Depression screen PHQ 2/9     05/02/2023    3:50 PM 04/19/2023    1:43 PM 02/20/2023    1:54 PM 12/07/2022    1:42 PM 10/05/2022    1:58 PM 08/10/2022    1:30 PM 05/09/2022    1:36 PM  Depression screen PHQ 2/9  Decreased Interest 0 0 0 0 0 1 0  Down, Depressed, Hopeless 0 0 0 0 0 1 0  PHQ - 2 Score 0 0 0 0 0 2 0     Review of Systems  Musculoskeletal:        Bilateral knee pain Bilateral ankle pain  Neurological:  Positive for weakness and numbness.  All other systems reviewed and are negative.      Objective:   Physical Exam        Assessment & Plan:  Guillain Barre Syndrome: Continue current medication regimen. Continue Outpatient Therapy. Continue current medication regimen. Continue to Monitor. 04/19/2023 Polyarthralgia: Continue current medication regimen . Continue to Monitor. 04/19/2023 Chronic Pain of Both Knees: Continue Outpatient Therapy. Continue current medication regimen. Continue to Monitor. 0516/2024 Chronic Pain Syndrome: Ms. Bahar was instructed to keep a Pain Journal for two weeks and send a My-Chart message with update, she verbalizes understanding. Continue Oxycodone 5 mg three times a day as needed for pain.  #90. We will continue the opioid monitoring program, this consists of regular clinic visits, examinations, urine drug screen, pill counts as well as use of [place] Controlled Substance Reporting system. A 12 month History has been reviewed on the [facility] Controlled Substance Reporting System  on 04/19/2023   F/U in 2 month x

## 2023-06-27 ENCOUNTER — Encounter: Payer: Self-pay | Admitting: Registered Nurse

## 2023-06-28 MED ORDER — DICYCLOMINE HCL 10 MG PO CAPS: 10.0000 mg | ORAL_CAPSULE | Freq: Three times a day (TID) | ORAL | 0 refills | Status: DC

## 2023-06-28 NOTE — Addendum Note (Signed)
Addended by: Gwenevere Abbot on: 06/28/2023 09:25 PM   Modules accepted: Orders

## 2023-07-01 LAB — DRUG TOX ALC METAB W/CON, ORAL FLD: Alcohol Metabolite: NEGATIVE ng/mL (ref <25)

## 2023-07-01 LAB — DRUG TOX MONITOR 1 W/CONF, ORAL FLD

## 2023-07-02 ENCOUNTER — Telehealth: Payer: Self-pay | Admitting: *Deleted

## 2023-07-02 NOTE — Telephone Encounter (Signed)
Call placed to Ms. [employer], Oral swab results was reviewed and consistent. She verbalizes understanding.

## 2023-07-02 NOTE — Telephone Encounter (Signed)
Patient inquring results of drug screen. She says her phone has been messing up so hopefully you'll be able to reach her.

## 2023-07-04 ENCOUNTER — Other Ambulatory Visit: Payer: Self-pay | Admitting: Medical

## 2023-07-04 LAB — HM PAP SMEAR

## 2023-07-04 LAB — RESULTS CONSOLE HPV: CHL HPV: NEGATIVE

## 2023-07-09 ENCOUNTER — Telehealth: Payer: Self-pay | Admitting: Medical

## 2023-07-09 NOTE — Telephone Encounter (Signed)
error 

## 2023-07-11 ENCOUNTER — Encounter: Payer: Self-pay | Admitting: Internal Medicine

## 2023-07-11 ENCOUNTER — Ambulatory Visit (INDEPENDENT_AMBULATORY_CARE_PROVIDER_SITE_OTHER): Payer: 59 | Admitting: Internal Medicine

## 2023-07-11 ENCOUNTER — Ambulatory Visit: Payer: 59 | Admitting: Medical

## 2023-07-11 VITALS — BP 130/80 | HR 78 | Temp 98.0°F | Ht 63.5 in | Wt 171.0 lb

## 2023-07-11 DIAGNOSIS — Z20822 Contact with and (suspected) exposure to covid-19: Secondary | ICD-10-CM | POA: Diagnosis not present

## 2023-07-11 DIAGNOSIS — J441 Chronic obstructive pulmonary disease with (acute) exacerbation: Secondary | ICD-10-CM

## 2023-07-11 LAB — POC COVID19 BINAXNOW: SARS Coronavirus 2 Ag: NEGATIVE

## 2023-07-11 MED ORDER — DOXYCYCLINE HYCLATE 100 MG PO TABS: 100.0000 mg | ORAL_TABLET | Freq: Two times a day (BID) | ORAL | 0 refills | Status: DC

## 2023-07-11 MED ORDER — AZITHROMYCIN 250 MG PO TABS: ORAL_TABLET | ORAL | 0 refills | Status: DC

## 2023-07-11 NOTE — Patient Instructions (Signed)
Your COVID test was negative today.  I recommend to check another COVID test in 2 to 3 days from today. You can do   at home or you could schedule a nurse visit and come here to do it.  Take the antibiotic called doxycycline  Continue Trelegy and all your nebulizations  Take probiotics for few days.  Do not  stay in contact with anybody with COVID or suspected COVID  Call if not gradually back to his normal self

## 2023-07-11 NOTE — Progress Notes (Signed)
Subjective:    Patient ID: Jill Spence, female    DOB: 05-Oct-1951, 72 y.o.   MRN: [id number]  DOS:  07/11/2023 Type of visit - description: Acute  States her aide had a + COVID test but they are rechecking her because they are not sure if the test was valid. The patient herself has noted increase in mucus production the last few days.  States she is coughing up green thick mucus.  No hemoptysis No fever Always have chills. No chest pain. Her oxygen need has increased a little from 3 L to 4 L.   Review of Systems See above   Past Medical History:  Diagnosis Date   Anemia    low iron   Anxiety    Arthritis    Bipolar disorder (HCC)    Cancer (HCC) 1973   COPD (chronic obstructive pulmonary disease) (HCC)    Family history of adverse reaction to anesthesia    mom "was put to sleep and she never woke up" -    GERD (gastroesophageal reflux disease)    Guillain Barr syndrome (HCC)    numbness in toes, legs hurt   Headache    migraines as a teenager   IBS (irritable bowel syndrome)    Lump, breast    removed years ago per Pt.   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, has "quiet' seizures   Thyroid disease     Past Surgical History:  Procedure Laterality Date   BREAST SURGERY     COLONOSCOPY     EYE SURGERY Bilateral    cataract surgery with lens implants   MANDIBLE RECONSTRUCTION     MINOR REMOVAL OF MANDIBULAR HARDWARE Right 10/15/2015   Procedure: MINOR REMOVAL OF Right MANDIBULAR HARDWARE;  Surgeon: Ocie Doyne, DDS;  Location: [facility] OR;  Service: Oral Surgery;  Laterality: Right;   TONSILLECTOMY      Current Outpatient Medications  Medication Instructions   albuterol (PROVENTIL HFA;VENTOLIN HFA) 108 (90 BASE) MCG/ACT inhaler 2 puffs, Every 6 hours PRN   albuterol (PROVENTIL) 2.5 mg, Every 6 hours PRN   alendronate (FOSAMAX) 70 mg, Oral, Every Sun, Take with a full glass of water on an  empty stomach.    ARIPiprazole (ABILIFY) 15 mg, Oral, Daily   B Complex Vitamins (VITAMIN B COMPLEX) TABS 1 tablet, Oral, Daily   benzonatate (TESSALON) 100 mg, Oral, 3 times daily PRN   benztropine (COGENTIN) 0.5 mg, Oral, 2 times daily   Bepotastine Besilate (BEPREVE) 1.5 % SOLN 1 drop, Both Eyes, 2 times daily PRN   diclofenac Sodium (VOLTAREN ARTHRITIS PAIN) 4 g, Topical, 4 times daily   dicyclomine (BENTYL) 10 mg, Oral, 3 times daily before meals   doxycycline (VIBRA-TABS) 100 mg, Oral, Daily   famciclovir (FAMVIR) 250 mg, Oral, 2 times daily   Fluticasone-Umeclidin-Vilant (TRELEGY ELLIPTA) 100-62.5-25 MCG/ACT AEPB 1 puff, Inhalation, Daily   gabapentin (NEURONTIN) 1,200 mg, Oral, 3 times daily   hydrOXYzine (ATARAX) 25 mg, Oral, 2 times daily PRN   hydrOXYzine (ATARAX) 10-20 mg, Oral, At bedtime PRN   ibuprofen (ADVIL) 800 mg, Oral, Every 8 hours PRN   ipratropium-albuterol (DUONEB) 0.5-2.5 (3) MG/3ML SOLN 3 mLs, Nebulization, Every 4 hours PRN   levocetirizine (XYZAL) 5 mg, Oral, Every evening   loperamide (IMODIUM) 2 MG capsule 1 tab every 6-8 hours as needed diarrhea   loperamide (IMODIUM) 2  MG capsule Oral   magnesium oxide (MAG-OX) 250 mg, Oral, Daily   methimazole (TAPAZOLE) 5 mg, Oral, Daily   montelukast (SINGULAIR) 10 mg, Oral, Daily at bedtime   NONFORMULARY OR COMPOUNDED ITEM Elevated toilet seat   olopatadine (PATANOL) 0.1 % ophthalmic solution 1 drop, Both Eyes, 2 times daily   oxcarbazepine (TRILEPTAL) 600 mg, Oral, 2 times daily   oxyCODONE (ROXICODONE) 5 mg, Oral, 3 times daily PRN   pantoprazole (PROTONIX) 40 mg, Oral, Daily   potassium chloride SA (KLOR-CON) 20 MEQ tablet 20 mEq, Oral, 2 times daily   predniSONE (DELTASONE) 10 mg, Oral, Daily with breakfast   ranitidine (ZANTAC) 300 mg, Oral, Every evening   sodium chloride HYPERTONIC 3 % nebulizer solution 3 ml via neb twice daily   traZODone (DESYREL) 50 mg, Oral, Daily at bedtime   Vitamin D, Ergocalciferol,  (DRISDOL) 1.25 MG (50000 UNIT) CAPS capsule TAKE ONE CAPSULE BY MOUTH ONCE WEEKLY FOR SIX WEEKS       Objective:   Physical Exam BP 130/80 (BP Location: Left Arm, Patient Position: Sitting, Cuff Size: Normal)   Pulse 78   Temp 98 F (36.7 C) (Oral)   Ht 5' 3.5" (1.613 m)   Wt 171 lb (77.6 kg)   SpO2 94%   BMI 29.82 kg/m  General:   Well developed, NAD, BMI noted. HEENT:  Normocephalic . Face symmetric, atraumatic Lungs:  Decreased breath sounds bilaterally.  Cough noted, increased expiratory time. Heart: RRR,  no murmur.  Lower extremities: no pretibial edema bilaterally  Skin: Not pale. Not jaundice Neurologic:  alert & oriented X3.  Speech normal, gait assisted by wheelchair. Psych--  Cognition and judgment appear intact.  Cooperative with normal attention span and concentration.  Behavior appropriate. No anxious or depressed appearing.      Assessment     72 year old female, PMH include hypothyroidism, COPD, history of chronic respiratory failure with hypoxia, chronic pain syndrome, presents w/   COPD exacerbation: The patient has increased symptoms for the last few days characterized by increasing sputum production.  O2 sat here is 94%, vital signs stable. Also was exposed with somebody with possible COVID, last contact was 2 days ago. Jill Spence's COVID test today is negative. Plan: Avoid any contact with a COVID case or suspected COVID case Treat current exacerbation with doxycycline. Take probiotics to prevent diarrhea. Recheck for COVID in 2 to 3 days.  Either home or here, see AVS. Call if not gradually back to normal

## 2023-07-16 ENCOUNTER — Telehealth: Payer: Self-pay

## 2023-07-16 MED ORDER — OXYCODONE HCL 5 MG PO TABS: 5.0000 mg | ORAL_TABLET | Freq: Three times a day (TID) | ORAL | 0 refills | Status: DC | PRN

## 2023-07-16 NOTE — Telephone Encounter (Signed)
Patient called for a pain medication refill.

## 2023-07-16 NOTE — Telephone Encounter (Signed)
PMP was Reviewed.  Last Oxycodone was filled was on 06/22/2023.  Oxycodone e-scribed today, call place to Ms. Redler, she verbalizes understanding.

## 2023-07-17 ENCOUNTER — Other Ambulatory Visit: Payer: Self-pay | Admitting: Medical

## 2023-07-19 ENCOUNTER — Ambulatory Visit (INDEPENDENT_AMBULATORY_CARE_PROVIDER_SITE_OTHER): Payer: 59 | Admitting: Internal Medicine

## 2023-07-19 ENCOUNTER — Encounter (INDEPENDENT_AMBULATORY_CARE_PROVIDER_SITE_OTHER): Payer: Self-pay

## 2023-07-19 ENCOUNTER — Encounter: Payer: Self-pay | Admitting: Internal Medicine

## 2023-07-19 VITALS — BP 120/76 | HR 46 | Ht 63.5 in | Wt 174.4 lb

## 2023-07-19 DIAGNOSIS — E0590 Thyrotoxicosis, unspecified without thyrotoxic crisis or storm: Secondary | ICD-10-CM | POA: Diagnosis not present

## 2023-07-19 DIAGNOSIS — E042 Nontoxic multinodular goiter: Secondary | ICD-10-CM | POA: Diagnosis not present

## 2023-07-19 DIAGNOSIS — E559 Vitamin D deficiency, unspecified: Secondary | ICD-10-CM | POA: Diagnosis not present

## 2023-07-19 LAB — TSH: TSH: 0.39 u[IU]/mL (ref 0.35–5.50)

## 2023-07-19 LAB — T3, FREE: T3, Free: 2.9 pg/mL (ref 2.3–4.2)

## 2023-07-19 LAB — T4, FREE: Free T4: 0.7 ng/dL (ref 0.60–1.60)

## 2023-07-19 NOTE — Patient Instructions (Signed)
Please stop at the lab.  Please continue Methimazole 5 mg daily.  You should have an endocrinology follow-up appointment in 6 months.

## 2023-07-19 NOTE — Progress Notes (Signed)
Patient ID: Jill Spence, female   DOB: [birth year], 72 y.o.   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  She previously saw Dr. Everardo All, but last visit with me 6 months ago.   She is here with her mental health caregiver.  Interim history: She still has intermittent problems swallowing especially when she lays on her back watching TV, and also feels mucus in her neck. She has Guillain-Barr syndrome - with exacerbations. She has fatigue and hair loss.   Thyrotoxicosis: Patient was found to have abnormal thyroid tests at least in 2018 -per my review of the records.  She is currently on methimazole 5 mg daily.  I reviewed pt's thyroid tests: Lab Results  Component Value Date   TSH 0.52 03/05/2023   TSH 0.26 (L) 01/11/2023   TSH 0.69 07/11/2022   TSH 0.59 03/23/2022   TSH 0.81 12/22/2021   TSH 0.32 (L) 09/21/2021   TSH 0.70 07/26/2021   TSH 1.91 04/26/2021   TSH <0.01 (L) 03/15/2021   TSH <0.01 Repeated and verified X2. (L) 10/19/2020   FREET4 0.66 03/05/2023   FREET4 0.65 01/11/2023   FREET4 0.62 07/11/2022   FREET4 0.65 03/23/2022   FREET4 0.56 (L) 12/22/2021   FREET4 0.51 (L) 09/21/2021   FREET4 0.57 (L) 07/26/2021   FREET4 0.37 (L) 04/26/2021   FREET4 0.51 (L) 03/15/2021   FREET4 0.81 10/19/2020   T3FREE 2.9 03/05/2023   T3FREE 2.6 01/11/2023   T3FREE 2.4 07/11/2022   Lab Results  Component Value Date   T3FREE 2.9 03/05/2023   T3FREE 2.6 01/11/2023   T3FREE 2.4 07/11/2022     Ref Range & Units   Thyroid Stimulating Hormone (TSH) 0.34 - 5.66 IU/mL 0.04 Low    Thyroxine, Free (FT4) 0.52 - 1.21 ng/dL 4.09   Resulting Agency  [facility]   Specimen Collected: 09/24/17 11:35 Last Resulted: 09/24/17 13:59  Received From: [facility]  Result Received: 03/15/21 13:35   Antithyroid antibodies: No results found  for: "TSI"  Multiple thyroid nodules:  Thyroid U/S (10/26/2020): Parenchymal Echotexture: Moderately heterogenous  Isthmus: 0.3 cm  Right lobe: 4.6 x 1.5 x 1.6 cm  Left lobe: 4.4 x 2.3 x 2.4 cm  _________________________________________________________   Estimated total number of nodules >/= 1 cm: 1 _________________________________________________________   Nodule # 1:  Location: Right; Inferior  Maximum size: 0.9 cm; Other 2 dimensions: 0.6 x 0.5 cm  Composition: solid/almost completely solid (2)  Echogenicity: hypoechoic (2) Given size (<0.9 cm) and appearance, this nodule does NOT meet TI-RADS criteria for biopsy or dedicated follow-up.  _________________________________________________________   Nodule # 2:  Location: Right; Mid  Maximum size: 0.8 cm; Other 2 dimensions: 0.5 x 0 5 cm  Composition: spongiform (0)  Echogenicity: isoechoic (1)  This nodule does NOT meet TI-RADS criteria for biopsy or dedicated follow-up.  _________________________________________________________   Nodule # 3:  Location: Right; Superior medial  Maximum size: 0.6 cm; Other 2 dimensions: 0.5 x 0.5 cm  Composition: solid/almost completely solid (2)  Echogenicity: hypoechoic (2) Given size (<0.9 cm) and appearance, this nodule does NOT meet TI-RADS criteria for biopsy or dedicated follow-up.  _________________________________________________________   Nodule # 4:  Location: Right; Superior  Maximum size: 0.8 cm; Other 2 dimensions: 0.5 x 0.7 cm  Composition: mixed cystic and solid (1)  Echogenicity: hypoechoic (2)  Given size (<1.4 cm) and  appearance, this nodule does NOT meet TI-RADS criteria for biopsy or dedicated follow-up.  _________________________________________________________   Nodule # 5:  Location: Left; Inferior  Maximum size: 2.9 cm; Other 2 dimensions: 2.3 x 2.2 cm  Composition: mixed cystic and solid (1)  Echogenicity: hypoechoic (2) Margins: lobulated/irregular (2)   Echogenic foci: macrocalcifications (1)  ACR TI-RADS total points: 6. ACR TI-RADS risk category: TR4 (4-6 points)  **Given size (>/= 1.5 cm) and appearance, fine needle aspiration of this moderately suspicious nodule should be considered based on TI-RADS criteria.  _________________________________________________________   Nodule # 6:  Location: Left; Superior  Maximum size: 0.7 cm; Other 2 dimensions: 0.5 x 0.7 cm  Composition: solid/almost completely solid (2)  Echogenicity: hypoechoic (2) Given size (<0.9 cm) and appearance, this nodule does NOT meet TI-RADS criteria for biopsy or dedicated follow-up.  ________________________________________________________   IMPRESSION: 1. Multinodular goiter. 2. Dominant nodule in the left inferior thyroid measuring up to 2.9 cm (labeled 5) which meets criteria (TI-RADS category 4) for tissue sampling. Recommend ultrasound-guided fine-needle aspiration. 3. Additional scattered thyroid nodules appear benign. These nodules require additional dedicated follow-up.    Thyroid uptake and scan (01/13/2021): Dominant hot nodule in the left thyroid lobe with suppression of the  rest of the gland. Findings suggest a hyperfunctioning thyroid  nodule.  4 hour I-131 uptake = 10.1% (normal 5-20%)   24 hour I-131 uptake = 22.4% (normal 10-30%)   IMPRESSION:  Findings suggest a hyperfunctioning nodule in the left thyroid lobe  suppressing the rest of the gland.   PET scan (05/01/2021): The thyroid nodules were not FDG avid  Thyroid U/S (07/18/2022): Parenchymal Echotexture: Moderately heterogenous  Isthmus: 0.3 cm  Right lobe: 4.1 cm x 1.4 cm x 1.9 cm  Left lobe: 4.6 cm x 2.5 cm x 2.4 cm  ____________________________________________________   Nodule labeled 1, superior right thyroid, 6 mm. Nodule has cystic/spongiform characteristics and does not meet criteria for surveillance.   Nodule labeled 2, superior right thyroid, 8 mm. Nodule  has cystic/spongiform characteristics and does not meet criteria for surveillance.   Nodule labeled 3, mid right thyroid, 8 mm. Nodule has spongiform/cystic characteristics and does not meet criteria for surveillance.   Nodule labeled 4, inferior right thyroid, 7 mm, decreased in size, TR 4 characteristics. Nodule does not meet criteria for surveillance.   Nodule labeled 5, mid left thyroid, unchanged 7 mm. Nodule has TR 4 characteristics and does not meet criteria for surveillance.   Nodule labeled 6, inferior left thyroid, 3.1 cm. Nodule has TR 4 characteristics and meets criteria for biopsy.   No adenopathy   IMPRESSION: Multinodular thyroid again demonstrated.   Left inferior thyroid nodule again meets criteria for biopsy, as designated by the newly established ACR TI-RADS criteria, and referral for biopsy is recommended.    FNA (09/13/2022): Atypia of undetermined significance (Bethesda category III)  Afirma:  benign  Mentions: - + hoarseness - + dysphagia - + choking - + SOB with lying down These improved in the last year, but still having them intermittently  No: - tremors - anxiety - palpitations - hyperdefecation - weight loss  Pt does have a FH of thyroid ds.: nieces. No FH of thyroid cancer. No h/o radiation tx to head or neck. + steroid use - Prednisone 10 mg daily prn - last dose 5 days ago. No herbal supplements.  Not on a B complex.  She also has a history of vitamin D deficiency, IBS -diarrhea.  ROS: + see HPI  Past Medical History:  Diagnosis Date   Anemia    low iron   Anxiety    Arthritis    Bipolar disorder (HCC)    Cancer (HCC) 1973   COPD (chronic obstructive pulmonary disease) (HCC)    Family history of adverse reaction to anesthesia    mom "was put to sleep and she never woke up" -    GERD (gastroesophageal reflux disease)    Guillain Barr syndrome (HCC)    numbness in toes, legs hurt   Headache    migraines as a teenager    IBS (irritable bowel syndrome)    Lump, breast    removed years ago per Pt.   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, has "quiet' seizures   Thyroid disease    Past Surgical History:  Procedure Laterality Date   BREAST SURGERY     COLONOSCOPY     EYE SURGERY Bilateral    cataract surgery with lens implants   MANDIBLE RECONSTRUCTION     MINOR REMOVAL OF MANDIBULAR HARDWARE Right 10/15/2015   Procedure: MINOR REMOVAL OF Right MANDIBULAR HARDWARE;  Surgeon: Ocie Doyne, DDS;  Location: [facility] OR;  Service: Oral Surgery;  Laterality: Right;   TONSILLECTOMY     Social History   Socioeconomic History   Marital status: Widowed    Spouse name: Not on file   Number of children: Not on file   Years of education: Not on file   Highest education level: Some college, no degree  Occupational History   Not on file  Tobacco Use   Smoking status: Former    Current packs/day: 0.00    Average packs/day: 0.3 packs/day for 6.0 years (1.5 ttl pk-yrs)    Types: Cigarettes    Start date: 10/13/2001    Quit date: 10/14/2007    Years since quitting: 15.7   Smokeless tobacco: Never  Vaping Use   Vaping status: Never Used  Substance and Sexual Activity   Alcohol use: No   Drug use: No   Sexual activity: Not on file  Other Topics Concern   Not on file  Social History Narrative   Not on file   Social Determinants of Health   Financial Resource Strain: Medium Risk (04/25/2023)   Overall Financial Resource Strain (CARDIA)    Difficulty of Paying Living Expenses: Somewhat hard  Food Insecurity: Food Insecurity Present (04/25/2023)   Hunger Vital Sign    Worried About Running Out of Food in the Last Year: Sometimes true    Ran Out of Food in the Last Year: Sometimes true  Transportation Needs: No Transportation Needs (04/25/2023)   PRAPARE - Administrator, Civil Service (Medical): No    Lack of Transportation  (Non-Medical): No  Physical Activity: Insufficiently Active (04/25/2023)   Exercise Vital Sign    Days of Exercise per Week: 1 day    Minutes of Exercise per Session: 30 min  Stress: Stress Concern Present (04/25/2023)   [employer] of Occupational Health - Occupational Stress Questionnaire    Feeling of Stress : To some extent  Social Connections: Moderately Isolated (04/25/2023)   Social Connection and Isolation Panel [NHANES]    Frequency of Communication with Friends and Family: More than three times a week    Frequency of Social Gatherings with Friends and Family: Once a week    Attends Religious Services: 1  to 4 times per year    Active Member of Clubs or Organizations: No    Attends Banker Meetings: Never    Marital Status: Widowed  Intimate Partner Violence: Not on file   Current Outpatient Medications on File Prior to Visit  Medication Sig Dispense Refill   albuterol (PROVENTIL HFA;VENTOLIN HFA) 108 (90 BASE) MCG/ACT inhaler Inhale 2 puffs into the lungs every 6 (six) hours as needed for wheezing.     albuterol (PROVENTIL) (2.5 MG/3ML) 0.083% nebulizer solution Take 2.5 mg by nebulization every 6 (six) hours as needed for wheezing.     alendronate (FOSAMAX) 70 MG tablet Take 70 mg by mouth every Sunday. Take with a full glass of water on an empty stomach.     ARIPiprazole (ABILIFY) 15 MG tablet Take 15 mg by mouth daily.     azithromycin (ZITHROMAX Z-PAK) 250 MG tablet 2 tabs a day the first day, then 1 tab a day x 4 days 6 tablet 0   B Complex Vitamins (VITAMIN B COMPLEX) TABS Take 1 tablet by mouth daily. 30 tablet 5   benzonatate (TESSALON) 100 MG capsule Take 1 capsule (100 mg total) by mouth 3 (three) times daily as needed for cough. 30 capsule 0   benztropine (COGENTIN) 0.5 MG tablet Take 0.5 mg by mouth 2 (two) times daily.     Bepotastine Besilate (BEPREVE) 1.5 % SOLN Place 1 drop into both eyes 2 (two) times daily as needed. 10 mL 5   diclofenac Sodium  (VOLTAREN ARTHRITIS PAIN) 1 % GEL Apply 4 g topically 4 (four) times daily. 150 g 3   dicyclomine (BENTYL) 10 MG capsule Take 1 capsule (10 mg total) by mouth 3 (three) times daily before meals. 90 capsule 0   doxycycline (VIBRA-TABS) 100 MG tablet Take 1 tablet (100 mg total) by mouth 2 (two) times daily. 15 tablet 0   famciclovir (FAMVIR) 250 MG tablet Take 1 tablet (250 mg total) by mouth 2 (two) times daily. 60 tablet 2   Fluticasone-Umeclidin-Vilant (TRELEGY ELLIPTA) 100-62.5-25 MCG/ACT AEPB Inhale 1 puff into the lungs daily. 1 each 11   gabapentin (NEURONTIN) 400 MG capsule Take 1,200 mg by mouth 3 (three) times daily.     hydrOXYzine (ATARAX) 10 MG tablet Take 1-2 tablets (10-20 mg total) by mouth at bedtime as needed for anxiety (insomnia). 180 tablet 0   hydrOXYzine (ATARAX) 25 MG tablet Take 25 mg by mouth 2 (two) times daily as needed.     ibuprofen (ADVIL) 800 MG tablet Take 1 tablet (800 mg total) by mouth every 8 (eight) hours as needed for moderate pain. 30 tablet 2   ipratropium-albuterol (DUONEB) 0.5-2.5 (3) MG/3ML SOLN Take 3 mLs by nebulization every 4 (four) hours as needed. 360 mL 5   levocetirizine (XYZAL) 5 MG tablet Take 1 tablet (5 mg total) by mouth every evening. 30 tablet 3   loperamide (IMODIUM) 2 MG capsule Take by mouth.     loperamide (IMODIUM) 2 MG capsule TAKE ONE CAPSULE BY MOUTH EVERY 6 TO 8 HOURS AS NEEDED FOR DIARRHEA 30 capsule 5   magnesium oxide (MAG-OX) 400 MG tablet Take 250 mg by mouth daily.     methimazole (TAPAZOLE) 5 MG tablet Take 1 tablet (5 mg total) by mouth daily. 90 tablet 1   montelukast (SINGULAIR) 10 MG tablet Take 1 tablet (10 mg total) by mouth at bedtime. 90 tablet 3   NONFORMULARY OR COMPOUNDED ITEM Elevated toilet seat 2 each 0  olopatadine (PATANOL) 0.1 % ophthalmic solution Place 1 drop into both eyes 2 (two) times daily. 5 mL 3   oxcarbazepine (TRILEPTAL) 600 MG tablet Take 600 mg by mouth 2 (two) times daily.     oxyCODONE  (ROXICODONE) 5 MG immediate release tablet Take 1 tablet (5 mg total) by mouth 3 (three) times daily as needed for moderate pain. 90 tablet 0   pantoprazole (PROTONIX) 40 MG tablet Take 1 tablet (40 mg total) by mouth daily. 90 tablet 3   potassium chloride SA (KLOR-CON) 20 MEQ tablet Take 1 tablet (20 mEq total) by mouth 2 (two) times daily for 7 days. 14 tablet 0   predniSONE (DELTASONE) 10 MG tablet TAKE 1 TABLET (10 MG TOTAL) BY MOUTH DAILY WITH BREAKFAST. 30 tablet 2   ranitidine (ZANTAC) 300 MG capsule Take 300 mg by mouth every evening.     sodium chloride HYPERTONIC 3 % nebulizer solution 3 ml via neb twice daily 750 mL 12   traZODone (DESYREL) 50 MG tablet Take 50 mg by mouth at bedtime.     Vitamin D, Ergocalciferol, (DRISDOL) 1.25 MG (50000 UNIT) CAPS capsule TAKE ONE CAPSULE BY MOUTH ONCE WEEKLY FOR SIX WEEKS 6 capsule 0   No current facility-administered medications on file prior to visit.   Allergies  Allergen Reactions   Influenza Vaccines    Aspirin     Other Reaction(s): GI Intolerance   Family History  Problem Relation Age of Onset   Cancer Father    PE: BP 120/76   Pulse (!) 46   Ht 5' 3.5" (1.613 m)   Wt 174 lb 6.4 oz (79.1 kg)   SpO2 99%   BMI 30.41 kg/m  Wt Readings from Last 3 Encounters:  07/19/23 174 lb 6.4 oz (79.1 kg)  07/11/23 171 lb (77.6 kg)  06/26/23 173 lb (78.5 kg)   Constitutional: overweight, in NAD, in wheelchair, on oxygen (3-4 lpm) Eyes: no exophthalmos, no lid lag, no stare ENT: no thyromegaly and no thyroid nodules palpated, no cervical lymphadenopathy Cardiovascular: RRR, No MRG Respiratory: CTA B Musculoskeletal: no deformities Skin: no rashes Neurological: no tremor with outstretched hands  ASSESSMENT: 1.  Toxic adenoma with thyrotoxicosis  2.  Thyroid nodules  3.  Vitamin D deficiency - Per PCP  PLAN:  1. Patient with h/o thyrotoxicosis, previously with thyrotoxic symptoms: Weight loss, heat intolerance, hyperdefecation,  palpitations, anxiety, which resolved on methimazole -She had a thyroid uptake and scan in 2022 and this showed a left hyperfunctioning thyroid nodule as a possible cause for her thyrotoxicosis -She continues on methimazole 5 mg daily with good tolerance -At last visit, we checked her TFTs and the TSH was slightly low, at 0.26, which was possibly related to having taken prednisone the night before.  She takes this on an as-needed basis.  We did not change the regimen.  We rechecked the test in 03/2023 and they were all normal. -At last visit, she agreed for RAI treatment if absolutely necessary -For now, we will recheck her TFTs and change the methimazole dose accordingly -I will see her back in 6 months  2.  Thyroid nodules -Patient with a history of subcentimeter right thyroid nodules, not worrisome, but also a large left thyroid nodule that showed lobulation and macrocalcifications. This was most likely the site of thyroid hormone overproduction, detected on the thyroid uptake and scan.  We discussed that overactive nodules are rarely malignant, however, due to the distorted architecture we did biopsied in 09/2022.  The results were inconclusive, however, the Afirma molecular marker returned benign.  Therefore, we discussed that the risk of cancer in this nodule is very low -In the past, she was worried about getting thyroid surgery due to poor lung status and was also worried about keloid.  We did discuss about possibly doing RAI treatment to shrink the nodules but if we proceeded with this, we discussed about the need for another thyroid uptake.  Last year, she had some neck compression symptoms including dysphagia, shortness of breath, and choking but at last visit did improved and she did not feel that these were severe enough to prompt thyroidectomy especially since this is a risky procedure due to her lung condition.  I explained that RAI treatment can also affect her lung capacity.  We decided  for expectant follow-up. -At today's visit, her neck compression symptoms are stable. -We decided to check another ultrasound at next visit  Component     Latest Ref Rng 07/19/2023  T4,Free(Direct)     0.60 - 1.60 ng/dL 4.54   TSH     0.98 - 1.19 uIU/mL 0.39   Triiodothyronine,Free,Serum     2.3 - 4.2 pg/mL 2.9   Thyroid tests are normal.  Will continue the same dose of methimazole.  Carlus Pavlov, MD PhD [facility]

## 2023-07-24 LAB — HM MAMMOGRAPHY: HM Mammogram: NORMAL (ref 0–4)

## 2023-08-01 ENCOUNTER — Other Ambulatory Visit: Payer: Self-pay

## 2023-08-01 ENCOUNTER — Other Ambulatory Visit: Payer: Self-pay | Admitting: Medical

## 2023-08-01 ENCOUNTER — Telehealth: Payer: Self-pay | Admitting: Internal Medicine

## 2023-08-01 MED ORDER — METHIMAZOLE 5 MG PO TABS: 5.0000 mg | ORAL_TABLET | Freq: Every day | ORAL | 1 refills | Status: DC

## 2023-08-01 NOTE — Telephone Encounter (Addendum)
Jill Spence is calling on behalf of patient saying that the wrong medication was sent in and to the wrong pharmacy. Jill Spence states that the medication should have been  methimazole methimazole (TAPAZOLE) 5 MG tablet But Levothyroxine was called in.  The pharmacy that she uses is  [facility] [address] [place] - [address] (Ph: [phone])   Jill Spence states that patient is almost out of medication.

## 2023-08-01 NOTE — Telephone Encounter (Signed)
Methimazole 5 mg was sent in to [facility]

## 2023-08-08 ENCOUNTER — Other Ambulatory Visit: Payer: Self-pay | Admitting: Medical

## 2023-08-10 ENCOUNTER — Telehealth: Payer: Self-pay

## 2023-08-10 ENCOUNTER — Ambulatory Visit (INDEPENDENT_AMBULATORY_CARE_PROVIDER_SITE_OTHER): Payer: 59 | Admitting: Medical

## 2023-08-10 ENCOUNTER — Ambulatory Visit (HOSPITAL_BASED_OUTPATIENT_CLINIC_OR_DEPARTMENT_OTHER)
Admission: RE | Admit: 2023-08-10 | Discharge: 2023-08-10 | Disposition: A | Discharge status: Home / Self Care | Payer: 59 | Source: Ambulatory Visit | Attending: Medical | Admitting: Medical

## 2023-08-10 VITALS — BP 112/74 | HR 77 | Resp 18 | Ht 63.5 in | Wt 174.0 lb

## 2023-08-10 DIAGNOSIS — R252 Cramp and spasm: Secondary | ICD-10-CM | POA: Diagnosis not present

## 2023-08-10 DIAGNOSIS — K589 Irritable bowel syndrome without diarrhea: Secondary | ICD-10-CM | POA: Diagnosis not present

## 2023-08-10 DIAGNOSIS — J9611 Chronic respiratory failure with hypoxia: Secondary | ICD-10-CM

## 2023-08-10 DIAGNOSIS — T148XXA Other injury of unspecified body region, initial encounter: Secondary | ICD-10-CM

## 2023-08-10 DIAGNOSIS — E559 Vitamin D deficiency, unspecified: Secondary | ICD-10-CM

## 2023-08-10 DIAGNOSIS — J441 Chronic obstructive pulmonary disease with (acute) exacerbation: Secondary | ICD-10-CM

## 2023-08-10 DIAGNOSIS — Z9189 Other specified personal risk factors, not elsewhere classified: Secondary | ICD-10-CM

## 2023-08-10 DIAGNOSIS — J449 Chronic obstructive pulmonary disease, unspecified: Secondary | ICD-10-CM

## 2023-08-10 LAB — CBC WITH DIFFERENTIAL/PLATELET
Basophils Absolute: 0 10*3/uL (ref 0.0–0.1)
Basophils Relative: 0.7 % (ref 0.0–3.0)
Eosinophils Absolute: 0.1 10*3/uL (ref 0.0–0.7)
Eosinophils Relative: 1.7 % (ref 0.0–5.0)
HCT: 45.1 % (ref 36.0–46.0)
Hemoglobin: 14.6 g/dL (ref 12.0–15.0)
Lymphocytes Relative: 42.5 % (ref 12.0–46.0)
Lymphs Abs: 2.7 10*3/uL (ref 0.7–4.0)
MCHC: 32.4 g/dL (ref 30.0–36.0)
MCV: 97.5 fl (ref 78.0–100.0)
Monocytes Absolute: 0.5 10*3/uL (ref 0.1–1.0)
Monocytes Relative: 8.2 % (ref 3.0–12.0)
Neutro Abs: 2.9 10*3/uL (ref 1.4–7.7)
Neutrophils Relative %: 46.9 % (ref 43.0–77.0)
Platelets: 286 10*3/uL (ref 150.0–400.0)
RBC: 4.62 Mil/uL (ref 3.87–5.11)
RDW: 14.2 % (ref 11.5–15.5)
WBC: 6.3 10*3/uL (ref 4.0–10.5)

## 2023-08-10 LAB — COMPREHENSIVE METABOLIC PANEL
ALT: 13 U/L (ref 0–35)
AST: 18 U/L (ref 0–37)
Albumin: 3.8 g/dL (ref 3.5–5.2)
Alkaline Phosphatase: 73 U/L (ref 39–117)
BUN: 12 mg/dL (ref 6–23)
CO2: 31 meq/L (ref 19–32)
Calcium: 9.2 mg/dL (ref 8.4–10.5)
Chloride: 103 meq/L (ref 96–112)
Creatinine, Ser: 0.75 mg/dL (ref 0.40–1.20)
GFR: 79.88 mL/min (ref >60.00)
Glucose, Bld: 90 mg/dL (ref 70–99)
Potassium: 4.3 meq/L (ref 3.5–5.1)
Sodium: 139 meq/L (ref 135–145)
Total Bilirubin: 0.5 mg/dL (ref 0.2–1.2)
Total Protein: 7.1 g/dL (ref 6.0–8.3)

## 2023-08-10 LAB — MAGNESIUM: Magnesium: 1.8 mg/dL (ref 1.5–2.5)

## 2023-08-10 LAB — VITAMIN D 25 HYDROXY (VIT D DEFICIENCY, FRACTURES): VITD: 64 ng/mL (ref 30.00–100.00)

## 2023-08-10 NOTE — Telephone Encounter (Signed)
Colletta Maryland, RN  Maximino Sarin, CMA Cc: [place], Kateri Mc Hello,  I am forwarding this to the Care Guide so she can put it on the social worker's schedule. In future you can place an ambulatory REF [id number] referral in the computer. But I have forwarded this this message already. The Care guide will call her first thing on Monday to schedule her with the Child psychotherapist. Thank you for the referral.  Kathyrn Sheriff, RN, MSN, BSN, CCM Care Management Coordinator [phone]

## 2023-08-10 NOTE — Patient Instructions (Addendum)
Stage 3 severe COPD by GOLD classification  and hx  Chronic respiratory failure with hypoxia and recent (COPD exacerbation  - continue o2, trelegy and albuterol inhaler -cxr today to see if pnemonia present. Recently could not tolerate doxycline rx. Follow xray to see if need other antibiotic.  Bruising - CBC w/Diff - Comp Met (CMET)  Irritable bowel syndrome, unspecified type Continue bentyl and keep appt with gi MD September 05, 2023 - Comp Met (CMET)   Vitamin D deficiency Get vit d  level today   For thyroid disorder continue current thyroid meds rx'd by endocrinologist.  Follow up date to be determined after lab and xray
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
## 2023-08-10 NOTE — Telephone Encounter (Signed)
Staff message sent to [facility] in regards to [phone]

## 2023-08-11 ENCOUNTER — Telehealth: Payer: Self-pay | Admitting: Medical

## 2023-08-11 MED ORDER — PANTOPRAZOLE SODIUM 40 MG PO TBEC: 40.0000 mg | DELAYED_RELEASE_TABLET | Freq: Every day | ORAL | 3 refills | Status: DC

## 2023-08-11 MED ORDER — AZITHROMYCIN 250 MG PO TABS: ORAL_TABLET | ORAL | 0 refills | Status: AC

## 2023-08-11 MED ORDER — DICLOFENAC SODIUM 1 % EX GEL: 4.0000 g | Freq: Four times a day (QID) | CUTANEOUS | 3 refills | Status: DC

## 2023-08-11 MED ORDER — ALBUTEROL SULFATE HFA 108 (90 BASE) MCG/ACT IN AERS: 2.0000 | INHALATION_SPRAY | Freq: Four times a day (QID) | RESPIRATORY_TRACT | 11 refills | Status: DC | PRN

## 2023-08-11 NOTE — Addendum Note (Signed)
Addended by: Gwenevere Abbot on: 08/11/2023 06:36 AM   Modules accepted: Orders

## 2023-08-11 NOTE — Telephone Encounter (Signed)
I was trying to fill meds over the weekend. Some of her meds I have never filled and are historical marked since 2014. Will you call pt assistant who come with her or pharmacy clariy which meds currently active and taking. She some of meds not sure of fosamax, gabapentin, ability and trazadone. Her gabapentin is very high dose so wanted clarification.

## 2023-08-11 NOTE — Addendum Note (Signed)
Addended by: Gwenevere Abbot on: 08/11/2023 06:09 AM   Modules accepted: Orders

## 2023-08-13 ENCOUNTER — Telehealth: Payer: Self-pay | Admitting: *Deleted

## 2023-08-13 MED ORDER — FAMCICLOVIR 250 MG PO TABS: 250.0000 mg | ORAL_TABLET | Freq: Two times a day (BID) | ORAL | 2 refills | Status: DC

## 2023-08-13 MED ORDER — MONTELUKAST SODIUM 10 MG PO TABS: 10.0000 mg | ORAL_TABLET | Freq: Every day | ORAL | 3 refills | Status: DC

## 2023-08-13 MED ORDER — HYDROXYZINE HCL 10 MG PO TABS: 10.0000 mg | ORAL_TABLET | Freq: Every evening | ORAL | 0 refills | Status: DC | PRN

## 2023-08-13 MED ORDER — VITAMIN D (ERGOCALCIFEROL) 1.25 MG (50000 UNIT) PO CAPS: ORAL_CAPSULE | ORAL | 0 refills | Status: DC

## 2023-08-13 MED ORDER — DICYCLOMINE HCL 10 MG PO CAPS: 10.0000 mg | ORAL_CAPSULE | Freq: Three times a day (TID) | ORAL | 0 refills | Status: DC

## 2023-08-13 MED ORDER — ALBUTEROL SULFATE HFA 108 (90 BASE) MCG/ACT IN AERS: 2.0000 | INHALATION_SPRAY | Freq: Four times a day (QID) | RESPIRATORY_TRACT | 11 refills | Status: DC | PRN

## 2023-08-13 MED ORDER — BENZONATATE 100 MG PO CAPS: 100.0000 mg | ORAL_CAPSULE | Freq: Three times a day (TID) | ORAL | 0 refills | Status: DC | PRN

## 2023-08-13 MED ORDER — LOPERAMIDE HCL 2 MG PO CAPS: ORAL_CAPSULE | ORAL | 5 refills | Status: DC

## 2023-08-13 MED ORDER — IBUPROFEN 800 MG PO TABS: 800.0000 mg | ORAL_TABLET | Freq: Three times a day (TID) | ORAL | 2 refills | Status: DC | PRN

## 2023-08-13 MED ORDER — PANTOPRAZOLE SODIUM 40 MG PO TBEC: 40.0000 mg | DELAYED_RELEASE_TABLET | Freq: Every day | ORAL | 3 refills | Status: DC

## 2023-08-13 MED ORDER — TRELEGY ELLIPTA 100-62.5-25 MCG/ACT IN AEPB: 1.0000 | INHALATION_SPRAY | Freq: Every day | RESPIRATORY_TRACT | 11 refills | Status: DC

## 2023-08-13 MED ORDER — BEPOTASTINE BESILATE 1.5 % OP SOLN: 1.0000 [drp] | Freq: Two times a day (BID) | OPHTHALMIC | 5 refills | Status: DC | PRN

## 2023-08-13 MED ORDER — GABAPENTIN 400 MG PO CAPS: 1200.0000 mg | ORAL_CAPSULE | Freq: Three times a day (TID) | ORAL | 2 refills | Status: DC

## 2023-08-13 MED ORDER — LEVOCETIRIZINE DIHYDROCHLORIDE 5 MG PO TABS: 5.0000 mg | ORAL_TABLET | Freq: Every evening | ORAL | 3 refills | Status: DC

## 2023-08-13 NOTE — Addendum Note (Signed)
Addended by: Gwenevere Abbot on: 08/13/2023 09:04 AM   Modules accepted: Orders

## 2023-08-13 NOTE — Telephone Encounter (Signed)
[facility] , which loaded in chart

## 2023-08-13 NOTE — Progress Notes (Signed)
  Care Coordination   Note   08/13/2023 Name: NAUDIA GALE MRN: [id number] DOB: 04-04-1951  OTHELIA MI is a 72 y.o. year old female who sees [facility], Ramon [facility]red[place], [place] for primary care. I reached out to [name] by phone today in response to a referral   Follow up plan:  Telephone appointment with care coordination team member scheduled for:  08/16/2023  Encounter Outcome:  Patient Scheduled   Burman Nieves, [facility] Care Coordination Care Guide Direct Dial: [phone]

## 2023-08-13 NOTE — Telephone Encounter (Signed)
Spoke with pt , sent in medications

## 2023-08-13 NOTE — Addendum Note (Signed)
Addended by: Maximino Sarin on: 08/13/2023 09:32 AM   Modules accepted: Orders

## 2023-08-13 NOTE — Progress Notes (Signed)
  Care Coordination  Outreach Note  08/13/2023 Name: Jill Spence MRN: [id number] DOB: 1951-07-21   Care Coordination Outreach Attempts: outreach to schedule in response to a referral. Pt requests a callback   Follow Up Plan:  Additional outreach attempts will be made to offer the patient care coordination information and services.   Encounter Outcome:  Patient Request to Call Back  Burman Nieves, [facility] Care Coordination Care Guide Direct Dial: [phone]

## 2023-08-14 ENCOUNTER — Telehealth: Payer: Self-pay | Admitting: Medical

## 2023-08-14 ENCOUNTER — Ambulatory Visit: Payer: 59 | Admitting: Pulmonary Disease

## 2023-08-14 ENCOUNTER — Encounter: Payer: Self-pay | Admitting: Pulmonary Disease

## 2023-08-14 ENCOUNTER — Ambulatory Visit (INDEPENDENT_AMBULATORY_CARE_PROVIDER_SITE_OTHER): Payer: 59 | Admitting: Pulmonary Disease

## 2023-08-14 VITALS — BP 118/74 | HR 72 | Temp 97.2°F | Wt 173.0 lb

## 2023-08-14 DIAGNOSIS — R911 Solitary pulmonary nodule: Secondary | ICD-10-CM

## 2023-08-14 DIAGNOSIS — J439 Emphysema, unspecified: Secondary | ICD-10-CM

## 2023-08-14 DIAGNOSIS — J4489 Other specified chronic obstructive pulmonary disease: Secondary | ICD-10-CM | POA: Diagnosis not present

## 2023-08-14 MED ORDER — MONTELUKAST SODIUM 10 MG PO TABS: 10.0000 mg | ORAL_TABLET | Freq: Every day | ORAL | 3 refills | Status: DC

## 2023-08-14 MED ORDER — LEVOCETIRIZINE DIHYDROCHLORIDE 5 MG PO TABS: 5.0000 mg | ORAL_TABLET | Freq: Every evening | ORAL | 3 refills | Status: DC

## 2023-08-14 MED ORDER — PREDNISONE 10 MG PO TABS: 10.0000 mg | ORAL_TABLET | Freq: Every day | ORAL | 2 refills | Status: DC

## 2023-08-14 MED ORDER — ALBUTEROL SULFATE HFA 108 (90 BASE) MCG/ACT IN AERS: 2.0000 | INHALATION_SPRAY | Freq: Four times a day (QID) | RESPIRATORY_TRACT | 11 refills | Status: DC | PRN

## 2023-08-14 MED ORDER — IPRATROPIUM-ALBUTEROL 0.5-2.5 (3) MG/3ML IN SOLN: 3.0000 mL | RESPIRATORY_TRACT | 5 refills | Status: DC | PRN

## 2023-08-14 MED ORDER — TRELEGY ELLIPTA 200-62.5-25 MCG/ACT IN AEPB: 1.0000 | INHALATION_SPRAY | Freq: Every day | RESPIRATORY_TRACT | 3 refills | Status: DC

## 2023-08-14 NOTE — Telephone Encounter (Signed)
Pt called by accident , phone call was hung up before first ring

## 2023-08-14 NOTE — Patient Instructions (Signed)
Will renew your inhalers Continue Xyzal, Singulair Will order CT chest without contrast at next available for lung nodule Prednisone 10 mg/day Follow-up in 3 months

## 2023-08-14 NOTE — Progress Notes (Signed)
Jill Spence    [id number]    1950-12-17  Primary Care Physician:Saguier, Kateri Mc  Referring Physician: Esperanza Richters, PA-C [address] [place],  [place] [address]  Chief complaint: Follow-up for COPD  HPI: 72 y.o. with history of COPD on supplemental oxygen, lung nodules.   Maintained on Breo and now Trelegy inhaler.  She has had recurrent exacerbations over the past year requiring prednisone, antibiotics. She is also being followed with serial CT scans for lung nodule  States that she was hospitalized for COVID-19 at [facility] in early 2021 but I do not have any record of those. Diagnosed with multinodular goiter and hypothyroidism in 2022.  She is following with endocrinology and is on methimazole  2022 She had a CT chest and PET scan for evaluation of lung nodules PET scan showed hypermetabolic them in the right axilla.  She has been seen at dermatology and this lesion excised.  I have reviewed the previous notes from dermatology As per the patient and the final biopsy report from September 15 is benign keloid  She also had a follow-up CT chest which showed improvement in lung nodule  Pets: Has a dog Occupation: Retired Midwife Exposures: Reports mold in the previous apartment.  No current mold exposure.  No hot tub, Jacuzzi Smoking history: States that she quit smoking in 2008.  Cannot give me a clear idea of how much she smoked prior Travel history: No significant travel history Relevant family history: No significant family history of lung disease  Interim history: Complains of worsening dyspnea on exertion with cough and mucus production. Chief complaint today is increasing mucus production which is yellow in color and feeling of choking in the throat At last Trelegy was changed to [facility] but she did not like it and is back to Trelegy  She had a CT scan which showed increasing lung nodules and PET scan with no  significant uptake in the previously noted lung nodules.  She had a new irregular pulmonary nodule in the left lower lobe which needs follow-up Hypermetabolic left thyroid lobe correlating with previously performed thyroid ultrasound  Outpatient Encounter Medications as of 08/14/2023  Medication Sig   albuterol (PROVENTIL) (2.5 MG/3ML) 0.083% nebulizer solution Take 2.5 mg by nebulization every 6 (six) hours as needed for wheezing.   albuterol (VENTOLIN HFA) 108 (90 Base) MCG/ACT inhaler Inhale 2 puffs into the lungs every 6 (six) hours as needed for wheezing.   alendronate (FOSAMAX) 70 MG tablet Take 70 mg by mouth every Sunday. Take with a full glass of water on an empty stomach.   ARIPiprazole (ABILIFY) 15 MG tablet Take 15 mg by mouth daily.   azithromycin (ZITHROMAX Z-PAK) 250 MG tablet 2 tabs a day the first day, then 1 tab a day x 4 days   azithromycin (ZITHROMAX) 250 MG tablet Take 2 tablets on day 1, then 1 tablet daily on days 2 through 5   B Complex Vitamins (VITAMIN B COMPLEX) TABS Take 1 tablet by mouth daily.   benzonatate (TESSALON) 100 MG capsule Take 1 capsule (100 mg total) by mouth 3 (three) times daily as needed for cough.   benztropine (COGENTIN) 0.5 MG tablet Take 0.5 mg by mouth 2 (two) times daily.   Bepotastine Besilate (BEPREVE) 1.5 % SOLN Place 1 drop into both eyes 2 (two) times daily as needed.   diclofenac Sodium (VOLTAREN ARTHRITIS PAIN) 1 % GEL Apply 4  g topically 4 (four) times daily.   dicyclomine (BENTYL) 10 MG capsule Take 1 capsule (10 mg total) by mouth 3 (three) times daily before meals.   famciclovir (FAMVIR) 250 MG tablet Take 1 tablet (250 mg total) by mouth 2 (two) times daily.   Fluticasone-Umeclidin-Vilant (TRELEGY ELLIPTA) 100-62.5-25 MCG/ACT AEPB Inhale 1 puff into the lungs daily.   gabapentin (NEURONTIN) 400 MG capsule Take 3 capsules (1,200 mg total) by mouth 3 (three) times daily.   hydrOXYzine (ATARAX) 10 MG tablet Take 1-2 tablets (10-20 mg  total) by mouth at bedtime as needed for anxiety (insomnia).   ibuprofen (ADVIL) 800 MG tablet Take 1 tablet (800 mg total) by mouth every 8 (eight) hours as needed for moderate pain.   ipratropium-albuterol (DUONEB) 0.5-2.5 (3) MG/3ML SOLN Take 3 mLs by nebulization every 4 (four) hours as needed.   levocetirizine (XYZAL) 5 MG tablet Take 1 tablet (5 mg total) by mouth every evening.   loperamide (IMODIUM) 2 MG capsule TAKE ONE CAPSULE BY MOUTH EVERY 6 TO 8 HOURS AS NEEDED FOR DIARRHEA   magnesium oxide (MAG-OX) 400 MG tablet Take 250 mg by mouth daily.   methimazole (TAPAZOLE) 5 MG tablet Take 1 tablet (5 mg total) by mouth daily.   montelukast (SINGULAIR) 10 MG tablet Take 1 tablet (10 mg total) by mouth at bedtime.   NONFORMULARY OR COMPOUNDED ITEM Elevated toilet seat   olopatadine (PATANOL) 0.1 % ophthalmic solution Place 1 drop into both eyes 2 (two) times daily.   oxcarbazepine (TRILEPTAL) 600 MG tablet Take 600 mg by mouth 2 (two) times daily.   oxyCODONE (ROXICODONE) 5 MG immediate release tablet Take 1 tablet (5 mg total) by mouth 3 (three) times daily as needed for moderate pain.   pantoprazole (PROTONIX) 40 MG tablet Take 1 tablet (40 mg total) by mouth daily.   predniSONE (DELTASONE) 10 MG tablet TAKE 1 TABLET (10 MG TOTAL) BY MOUTH DAILY WITH BREAKFAST.   sodium chloride HYPERTONIC 3 % nebulizer solution 3 ml via neb twice daily   traZODone (DESYREL) 50 MG tablet Take 50 mg by mouth at bedtime.   Vitamin D, Ergocalciferol, (DRISDOL) 1.25 MG (50000 UNIT) CAPS capsule TAKE ONE CAPSULE BY MOUTH ONCE WEEKLY FOR SIX WEEKS   doxycycline (VIBRA-TABS) 100 MG tablet Take 1 tablet (100 mg total) by mouth 2 (two) times daily. (Patient not taking: Reported on 08/14/2023)   potassium chloride SA (KLOR-CON) 20 MEQ tablet Take 1 tablet (20 mEq total) by mouth 2 (two) times daily for 7 days.   No facility-administered encounter medications on file as of 08/14/2023.   Physical Exam: Blood pressure  118/74, pulse 72, temperature (!) 97.2 F (36.2 C), temperature source Temporal, weight 173 lb (78.5 kg), SpO2 97%. Gen:      No acute distress HEENT:  EOMI, sclera anicteric Neck:     No masses; no thyromegaly Lungs:    Clear to auscultation bilaterally; normal respiratory effort CV:         Regular rate and rhythm; no murmurs Abd:      + bowel sounds; soft, non-tender; no palpable masses, no distension Ext:    No edema; adequate peripheral perfusion Skin:      Warm and dry; no rash Neuro: alert and oriented x 3 Psych: normal mood and affect   Data Reviewed: Imaging: CT chest 03/29/2020-cirrhosis, thyroid nodule, severe emphysema  A new 4 mm nodule is seen along the minor fissure (3/61). 6 mm nodule in the lateral  segment  right middle lobe (3/69) has enlarged from 3 mm on  01/01/2019. 2 mm anterior right lower lobe nodule (3/76) is new.  Scarring in the apex of the left upper lobe with a new nodular  component measuring 6 mm (3/12). 3 mm lingular nodule (3/63) is new.  Additional tiny pulmonary nodules in the left lung are stable. No  pleural fluid. Airway is unremarkable.   CT chest 07/14/2020-new 5 mm nodule in the right middle lobe.  Remainder of the lung nodules are stable.  Enlarged pulmonary trunk  CT chest 10/20/2020-stable right middle lobe nodule, thyroid nodule, aortic atherosclerosis, emphysema  CT chest 04/14/2021-interval development of pulmonary nodules  PET scan 04/29/2021-12 mm lesion along the left anterior shoulder/axilla.  Multiple bilateral nodules up to 10 mm.  Super D CT 08/02/2021-recently noted pulm nodules are stable to decreased in size.  CT chest 04/10/2022-stable pulm nodules, severe emphysema, coronary artery disease  CT chest 05/17/2023-no interstitial lung disease, multiple lung nodules increase in size, severe emphysema, aortic atherosclerosis, coronary artery disease  PET scan 06/06/2023-no significant uptake in the previously noted lung nodules.  She  had a new irregular pulmonary nodule in the left lower lobe which needs follow-up Hypermetabolic left thyroid lobe correlating with previously performed thyroid ultrasound I have reviewed the images personally.  PFTs: 01/26/2021 FVC 1.68 [73%], FEV1 0.72 [40%], TLC 6.19 [126%], DLCO 5.09 [26%] Severe obstruction with air trapping and hyperinflation, severe diffusion defect  06/06/2023 FVC 1.62 [56%], FEV1 0.61 [28%], F/F38, TLC 6.92 [141%], DLCO 4.21 [22%] Severe obstruction with hyperinflation and diffusion defect  Labs: CBC 07/27/2020-WBC 7.3, eos 1%, absolute eosinophil count 73 IgE 07/27/2020-5  Alpha-1 antitrypsin 08/28/2028-170, PIMM  Assessment:  Severe COPD with exacerbations Has worsening dyspnea with chest congestion.  She feels that her current treatment regimen is not working for her. Echo is normal with no evidence of interstitial lung disease on CT scan PFTs do show worsening obstruction and I suspect her presentation is just due to COPD  Did not like Breztri and is back on Trelegy Continue duo nebs She is also on chronic prednisone at 10 mg/day which she uses intermittently Mucinex, flutter valve for mucociliary clearance, add percussion vest She is continuing with physical therapy.  Declined pulmonary rehab. We discussed possible lung volume reduction surgery but she does not want to go through that  Allergies, nasal congestion Continue Xyzal, Singulair  Pulmonary nodules New lung nodule noted on recent PET scan Will order follow-up CT chest without contrast in 1 month  Abnormal PET scan PET scan in 2022 showed hypermetabolic lesion along the left shoulder/axilla.  She had a skin lesion which was excised by dermatology and is reportedly a benign keloid.   Plan/Recommendations: Continue Trelegy, albuterol, DuoNebs Mucociliary clearance with Mucinex, flutter valve, percussion vest Xyzal, Singulair Prednisone 10 mg a day-using intermittently CT chest  Chilton Greathouse MD [facility] 08/14/2023, 2:35 PM  CC: Esperanza Richters, PA-C

## 2023-08-14 NOTE — Telephone Encounter (Signed)
Pt called stating that she had gotten a call from [place] today (9.10.24) and it had rung once and hung up. After reviewing Chart and consulting with clinical staff, advised pt that to our knowledge, no one from our office had called her directly and to keep an eye out for another call if it was regarding an important matter. Pt expressed "I am not going through this again with y'all. Last time you set me up for an appointment that I had to wait for TWO hours to see the doctor." After reviewing appt desk, advised pt that she had no future appts with [place] but sh was scheduled for an appt with [facility]. Also advised a message would be sent back to look into this call and we would call back with more info. Pt acknowledged understanding.

## 2023-08-15 ENCOUNTER — Other Ambulatory Visit: Payer: Self-pay | Admitting: Registered Nurse

## 2023-08-15 NOTE — Telephone Encounter (Signed)
Provider called Ms. Respess, she currently has 51 tablets of her Oxycodone. Refill not appropriate at this time. She has a scheduled appointment next week.  She verbalizes understanding.

## 2023-08-16 ENCOUNTER — Ambulatory Visit: Payer: Self-pay | Admitting: Licensed Clinical Social Worker

## 2023-08-17 NOTE — Patient Outreach (Signed)
Care Coordination   Initial Visit Note   08/16/2023 Name: Jill Spence MRN: [id number] DOB: [birth year]  Jill Spence is a 71 y.o. year old female who sees Saguier, Ramon Dredge, [place] for primary care. I spoke with  [name] and her Nurse Aid Barnabas Lister), POA (Harmon Pier), and Peer Support Worker Byrd Hesselbach) by phone today.  What matters to the patients health and wellness today?  Stress Management and supportive resources    Goals Addressed             This Visit's Progress    LCSW-Management of MH/Stress   On track    Activities and task to complete in order to accomplish goals.   Keep all upcoming appointments discussed today Continue with compliance of taking medication prescribed by Doctor Implement healthy coping skills discussed to assist with management of symptoms         SDOH assessments and interventions completed:  Yes  SDOH Interventions Today    Flowsheet Row Most Recent Value  SDOH Interventions   Housing Interventions Intervention Not Indicated  Transportation Interventions Intervention Not Indicated        Care Coordination Interventions:  Yes, provided  Interventions Today    Flowsheet Row Most Recent Value  Chronic Disease   Chronic disease during today's visit Chronic Obstructive Pulmonary Disease (COPD)  General Interventions   General Interventions Discussed/Reviewed General Interventions Discussed, Programmer, applications, Doctor Visits  Doctor Visits Discussed/Reviewed Doctor Visits Discussed  Mental Health Interventions   Mental Health Discussed/Reviewed Mental Health Discussed, Coping Strategies, Anxiety, Depression  Nutrition Interventions   Nutrition Discussed/Reviewed Nutrition Discussed  Pharmacy Interventions   Pharmacy Dicussed/Reviewed Pharmacy Topics Discussed, Medication Adherence  Safety Interventions   Safety Discussed/Reviewed Safety Discussed, Home Safety       Follow up plan: Follow up call scheduled for 2-4 weeks     Encounter Outcome:  Patient Visit Completed   [name], MSW, LCSW [facility] Care Management [facility]  [facility] [email or web address] Phone [phone] 5:24 AM

## 2023-08-17 NOTE — Patient Instructions (Signed)
Visit Information  Thank you for taking time to visit with me today. Please don't hesitate to contact me if I can be of assistance to you.   Following are the goals we discussed today:   Goals Addressed             This Visit's Progress    LCSW-Management of MH/Stress   On track    Activities and task to complete in order to accomplish goals.   Keep all upcoming appointments discussed today Continue with compliance of taking medication prescribed by Doctor Implement healthy coping skills discussed to assist with management of symptoms         Our next appointment is by telephone on 09/26 at 3:30 PM  Please call the care guide team at [phone] if you need to cancel or reschedule your appointment.   If you are experiencing a Mental Health or Behavioral Health Crisis or need someone to talk to, please call the Suicide and Crisis Lifeline: 988 call 911   Patient verbalizes understanding of instructions and care plan provided today and agrees to view in MyChart. Active MyChart status and patient understanding of how to access instructions and care plan via MyChart confirmed with patient.     [name], MSW, LCSW [facility] Care Management [place]  [facility] [email or web address] Phone [phone] 5:25 AM

## 2023-08-22 ENCOUNTER — Encounter: Payer: 59 | Attending: Physical Medicine and Rehabilitation | Admitting: Registered Nurse

## 2023-08-22 ENCOUNTER — Encounter: Payer: Self-pay | Admitting: Registered Nurse

## 2023-08-22 ENCOUNTER — Telehealth: Payer: Self-pay

## 2023-08-22 VITALS — BP 113/78 | HR 78 | Ht 63.5 in | Wt 171.0 lb

## 2023-08-22 DIAGNOSIS — G8929 Other chronic pain: Secondary | ICD-10-CM | POA: Insufficient documentation

## 2023-08-22 DIAGNOSIS — Z79891 Long term (current) use of opiate analgesic: Secondary | ICD-10-CM | POA: Diagnosis present

## 2023-08-22 DIAGNOSIS — G894 Chronic pain syndrome: Secondary | ICD-10-CM

## 2023-08-22 DIAGNOSIS — M2550 Pain in unspecified joint: Secondary | ICD-10-CM

## 2023-08-22 DIAGNOSIS — G610 Guillain-Barre syndrome: Secondary | ICD-10-CM | POA: Diagnosis not present

## 2023-08-22 DIAGNOSIS — M545 Low back pain, unspecified: Secondary | ICD-10-CM

## 2023-08-22 DIAGNOSIS — Z5181 Encounter for therapeutic drug level monitoring: Secondary | ICD-10-CM | POA: Diagnosis present

## 2023-08-22 MED ORDER — OXYCODONE HCL 5 MG PO TABS: 5.0000 mg | ORAL_TABLET | Freq: Three times a day (TID) | ORAL | 0 refills | Status: DC | PRN

## 2023-08-22 NOTE — Telephone Encounter (Signed)
Call back phone [phone].   [facility] wanted to know if they can send out Jill Spence Oxycodone today 08/22/2023. Followed by  a refill on 09/21/2023?   The script sent today states 08/26/2023 & 09/25/2023. Please advise.   Verbal okay give to release today on 08/22/19 & 09/21/2023. Pharmacy notified.

## 2023-08-22 NOTE — Progress Notes (Addendum)
Subjective:    Patient ID: Jill Spence, female    DOB: 10/04/1951, 72 y.o.   MRN: [id number]  HPI: Jill Spence is a 72 y.o. female who returns for follow up appointment for chronic pain and medication refill. She states her pain is located in her chronic low back pain  bilateral knees and generalized joint pain. She rates her pain 3. Her current exercise regime is walking and performing stretching exercises.  [name] Morphine equivalent is 22.50 MME.   Last Oral Swab was Performed on 06/26/2023 no medication detected.    Pain Inventory Average Pain 6 Pain Right Now 3 My pain is constant, aching, and throb  In the last 24 hours, has pain interfered with the following? General activity 7 Relation with others 0 Enjoyment of life 0 What TIME of day is your pain at its worst? night Sleep (in general) Poor  Pain is worse with: inactivity and some activites Pain improves with: medication Relief from Meds:  fair  Family History  Problem Relation Age of Onset  . Cancer Father    Social History   Socioeconomic History  . Marital status: Widowed    Spouse name: Not on file  . Number of children: Not on file  . Years of education: Not on file  . Highest education level: Some college, no degree  Occupational History  . Not on file  Tobacco Use  . Smoking status: Former    Current packs/day: 0.00    Average packs/day: 0.3 packs/day for 6.0 years (1.5 ttl pk-yrs)    Types: Cigarettes    Start date: 10/13/2001    Quit date: 10/14/2007    Years since quitting: 15.8  . Smokeless tobacco: Never  Vaping Use  . Vaping status: Never Used  Substance and Sexual Activity  . Alcohol use: No  . Drug use: No  . Sexual activity: Not on file  Other Topics Concern  . Not on file  Social History Narrative  . Not on file   Social Determinants of Health   Financial Resource Strain: Medium Risk (04/25/2023)   Overall Financial Resource Strain (CARDIA)   . Difficulty of Paying  Living Expenses: Somewhat hard  Food Insecurity: Food Insecurity Present (04/25/2023)   Hunger Vital Sign   . Worried About Programme researcher, broadcasting/film/video in the Last Year: Sometimes true   . Ran Out of Food in the Last Year: Sometimes true  Transportation Needs: No Transportation Needs (08/16/2023)   PRAPARE - Transportation   . Lack of Transportation (Medical): No   . Lack of Transportation (Non-Medical): No  Physical Activity: Insufficiently Active (04/25/2023)   Exercise Vital Sign   . Days of Exercise per Week: 1 day   . Minutes of Exercise per Session: 30 min  Stress: Stress Concern Present (04/25/2023)   [employer] of Occupational Health - Occupational Stress Questionnaire   . Feeling of Stress : To some extent  Social Connections: Moderately Isolated (04/25/2023)   Social Connection and Isolation Panel [NHANES]   . Frequency of Communication with Friends and Family: More than three times a week   . Frequency of Social Gatherings with Friends and Family: Once a week   . Attends Religious Services: 1 to 4 times per year   . Active Member of Clubs or Organizations: No   . Attends Banker Meetings: Never   . Marital Status: Widowed   Past Surgical History:  Procedure Laterality Date  . BREAST SURGERY    .  COLONOSCOPY    . EYE SURGERY Bilateral    cataract surgery with lens implants  . MANDIBLE RECONSTRUCTION    . MINOR REMOVAL OF MANDIBULAR HARDWARE Right 10/15/2015   Procedure: MINOR REMOVAL OF Right MANDIBULAR HARDWARE;  Surgeon: Ocie Doyne, DDS;  Location: [facility] OR;  Service: Oral Surgery;  Laterality: Right;  . TONSILLECTOMY     Past Surgical History:  Procedure Laterality Date  . BREAST SURGERY    . COLONOSCOPY    . EYE SURGERY Bilateral    cataract surgery with lens implants  . MANDIBLE RECONSTRUCTION    . MINOR REMOVAL OF MANDIBULAR HARDWARE Right 10/15/2015   Procedure: MINOR REMOVAL OF Right MANDIBULAR HARDWARE;  Surgeon: Ocie Doyne, DDS;  Location: [facility]  OR;  Service: Oral Surgery;  Laterality: Right;  . TONSILLECTOMY     Past Medical History:  Diagnosis Date  . Anemia    low iron  . Anxiety   . Arthritis   . Bipolar disorder (HCC)   . Cancer (HCC) 1973  . COPD (chronic obstructive pulmonary disease) (HCC)   . Family history of adverse reaction to anesthesia    mom "was put to sleep and she never woke up" -   . GERD (gastroesophageal reflux disease)   . Guillain Barr syndrome (HCC)    numbness in toes, legs hurt  . Headache    migraines as a teenager  . IBS (irritable bowel syndrome)   . Lump, breast    removed years ago per Pt.  . Oxygen deficiency   . Pneumonia   . Restless leg syndrome   . Seizures (HCC)    only has one when she gets upset, has "quiet' seizures  . Thyroid disease    There were no vitals taken for this visit.  Opioid Risk Score:   Fall Risk Score:  `1  Depression screen PHQ 2/9     05/02/2023    3:50 PM 04/19/2023    1:43 PM 02/20/2023    1:54 PM 12/07/2022    1:42 PM 10/05/2022    1:58 PM 08/10/2022    1:30 PM 05/09/2022    1:36 PM  Depression screen PHQ 2/9  Decreased Interest 0 0 0 0 0 1 0  Down, Depressed, Hopeless 0 0 0 0 0 1 0  PHQ - 2 Score 0 0 0 0 0 2 0    Review of Systems  Musculoskeletal:  Positive for gait problem.       Pain in the arms, right upper leg & both knees, both feet, elbows, and hands.  All other systems reviewed and are negative.      Objective:   Physical Exam Vitals and nursing note reviewed.  Constitutional:      Appearance: Normal appearance.  Cardiovascular:     Rate and Rhythm: Normal rate and regular rhythm.     Pulses: Normal pulses.     Heart sounds: Normal heart sounds.  Pulmonary:     Effort: Pulmonary effort is normal.     Breath sounds: Normal breath sounds.     Comments: Continuous Oxygen @ 3 liters nasal cannula Musculoskeletal:     Cervical back: Normal range of motion and neck supple.     Comments: Normal Muscle Bulk and Muscle Testing Reveals:   Upper Extremities: Full ROM and Muscle Strength 5/5  Lumbar Paraspinal Tenderness: L-4-L-5 Right Greater Trochanter Tenderness Lower Extremities: Right Lower Extremity Flexion Produces Pain into her Right Patella  Arrived  in wheelchair  Skin:    General: Skin is warm and dry.  Neurological:     Mental Status: She is alert and oriented to person, place, and time.  Psychiatric:        Mood and Affect: Mood normal.        Behavior: Behavior normal.         Assessment & Plan:  Guillain Barre Syndrome: Continue current medication regimen. Continue Outpatient Therapy. Continue current medication regimen. Continue to Monitor. 08/22/2023 Polyarthralgia: Continue current medication regimen . Continue to Monitor. 08/22/2023 Chronic Bilateral Low Back Pain: Continue HEP as Tolerated. Continue to Monitor.  Chronic Pain of Right  Knee: Continue Outpatient Therapy. Continue current medication regimen. Continue to Monitor. 08/22/2023 Chronic Pain Syndrome:  Continue Oxycodone 5 mg three times a day as needed for pain. #90. We will continue the opioid monitoring program, this consists of regular clinic visits, examinations, urine drug screen, pill counts as well as use of [place] Controlled Substance Reporting system. A 12 month History has been reviewed on the [place] Controlled Substance Reporting System on 08/22/2023   F/U in 2 months

## 2023-08-28 ENCOUNTER — Ambulatory Visit: Payer: 59 | Admitting: Registered Nurse

## 2023-08-29 ENCOUNTER — Other Ambulatory Visit: Payer: Self-pay | Admitting: Medical

## 2023-08-30 ENCOUNTER — Ambulatory Visit: Payer: Self-pay | Admitting: Licensed Clinical Social Worker

## 2023-08-31 NOTE — Patient Instructions (Signed)
Visit Information  Thank you for taking time to visit with me today. Please don't hesitate to contact me if I can be of assistance to you.   Following are the goals we discussed today:   Goals Addressed             This Visit's Progress    LCSW-Management of MH/Stress   On track    Activities and task to complete in order to accomplish goals.   Keep all upcoming appointments discussed today Continue with compliance of taking medication prescribed by Doctor Implement healthy coping skills discussed to assist with management of symptoms         Our next appointment is by telephone on 10/08 at 3:30 pm  Please call the care guide team at [phone] if you need to cancel or reschedule your appointment.   If you are experiencing a Mental Health or Behavioral Health Crisis or need someone to talk to, please call the Suicide and Crisis Lifeline: 988 call 911   Patient verbalizes understanding of instructions and care plan provided today and agrees to view in MyChart. Active MyChart status and patient understanding of how to access instructions and care plan via MyChart confirmed with patient.     [name], MSW, LCSW [facility] Care Management [place]  [facility] [email or web address] Phone [phone] 7:55 AM

## 2023-08-31 NOTE — Patient Outreach (Signed)
  Care Coordination   Follow Up Visit Note   08/30/2023 Name: Jill Spence MRN: [id number] DOB: 09-27-1951  Jill Spence is a 72 y.o. year old female who sees Saguier, Ramon Dredge, [place] for primary care. I spoke with  Jill Spence's aid by phone today.  What matters to the patients health and wellness today?  Patient is doing well. Experiencing stressors with phone that aid is assisting her with resolving. Appt with LCSW was re-scheduled    Goals Addressed             This Visit's Progress    LCSW-Management of MH/Stress   On track    Activities and task to complete in order to accomplish goals.   Keep all upcoming appointments discussed today Continue with compliance of taking medication prescribed by Doctor Implement healthy coping skills discussed to assist with management of symptoms         SDOH assessments and interventions completed:  No     Care Coordination Interventions:  Yes, provided  Interventions Today    Flowsheet Row Most Recent Value  Chronic Disease   Chronic disease during today's visit Chronic Obstructive Pulmonary Disease (COPD)  General Interventions   General Interventions Discussed/Reviewed General Interventions Reviewed       Follow up plan: Follow up call scheduled for 1-2 weeks    Encounter Outcome:  Patient Visit Completed   [name], MSW, LCSW [facility] Care Management [facility]  [facility] [email or web address] Phone [phone] 7:54 AM

## 2023-09-11 ENCOUNTER — Ambulatory Visit: Payer: Self-pay | Admitting: Licensed Clinical Social Worker

## 2023-09-13 NOTE — Patient Outreach (Signed)
  Care Coordination   Follow Up Visit Note   09/11/2023 Name: Jill Spence MRN: [id number] DOB: 09/22/1951  Jill Spence is a 72 y.o. year old female who sees Saguier, Ramon Dredge, [place] for primary care. I spoke with  [name] by phone today.  What matters to the patients health and wellness today?  Symptom Management    Goals Addressed             This Visit's Progress    LCSW-Management of MH/Stress   On track    Activities and task to complete in order to accomplish goals.   Keep all upcoming appointments discussed today Continue with compliance of taking medication prescribed by Doctor Implement healthy coping skills discussed to assist with management of symptoms Follow up with Department of Social Services          SDOH assessments and interventions completed:  No     Care Coordination Interventions:  Yes, provided  Interventions Today    Flowsheet Row Most Recent Value  Chronic Disease   Chronic disease during today's visit Chronic Obstructive Pulmonary Disease (COPD)  General Interventions   General Interventions Discussed/Reviewed General Interventions Reviewed, Doctor Visits  [Continue to f/up with [employer] regarding oxygen needs]  Doctor Visits Discussed/Reviewed Doctor Visits Reviewed  Mental Health Interventions   Mental Health Discussed/Reviewed Mental Health Reviewed, Coping Strategies  [Patient reports that she is putting herself first, strategies to continue prioritizing her needs/wants discussed. Pt endorses less harrassmet at residence and continues to receive strong support from aids/peer support worker.]  Nutrition Interventions   Nutrition Discussed/Reviewed Nutrition Reviewed  Pharmacy Interventions   Pharmacy Dicussed/Reviewed Pharmacy Topics Reviewed, Medication Adherence  Safety Interventions   Safety Discussed/Reviewed Safety Reviewed       Follow up plan: Follow up call scheduled for 4-6 weeks    Encounter Outcome:  Patient  Visit Completed   [name], MSW, LCSW [employer] Care Management [facility] Health  [facility] [email or web address] Phone [phone] 7:11 PM

## 2023-09-13 NOTE — Patient Instructions (Signed)
Visit Information  Thank you for taking time to visit with me today. Please don't hesitate to contact me if I can be of assistance to you.   Following are the goals we discussed today:   Goals Addressed             This Visit's Progress    LCSW-Management of MH/Stress   On track    Activities and task to complete in order to accomplish goals.   Keep all upcoming appointments discussed today Continue with compliance of taking medication prescribed by Doctor Implement healthy coping skills discussed to assist with management of symptoms Follow up with Department of Social Services          Our next appointment is by telephone on 10/22 at 1 PM  Please call the care guide team at [phone] if you need to cancel or reschedule your appointment.   If you are experiencing a Mental Health or Behavioral Health Crisis or need someone to talk to, please call the Suicide and Crisis Lifeline: 988 call 911   Patient verbalizes understanding of instructions and care plan provided today and agrees to view in MyChart. Active MyChart status and patient understanding of how to access instructions and care plan via MyChart confirmed with patient.     [name], MSW, LCSW [facility] Care Management [place]  [facility] [email or web address] Phone [phone] 7:13 PM

## 2023-09-14 ENCOUNTER — Ambulatory Visit (HOSPITAL_COMMUNITY)
Admission: RE | Admit: 2023-09-14 | Discharge: 2023-09-14 | Disposition: A | Discharge status: Home / Self Care | Payer: 59 | Source: Ambulatory Visit | Attending: Pulmonary Disease | Admitting: Pulmonary Disease

## 2023-09-14 DIAGNOSIS — R911 Solitary pulmonary nodule: Secondary | ICD-10-CM

## 2023-09-14 DIAGNOSIS — J439 Emphysema, unspecified: Secondary | ICD-10-CM | POA: Insufficient documentation

## 2023-09-14 DIAGNOSIS — J4489 Other specified chronic obstructive pulmonary disease: Secondary | ICD-10-CM | POA: Diagnosis present

## 2023-09-17 ENCOUNTER — Other Ambulatory Visit: Payer: Self-pay | Admitting: Medical

## 2023-09-20 NOTE — Telephone Encounter (Signed)
Rx refill benzonatate sent to pt pharmacy.

## 2023-09-25 ENCOUNTER — Encounter: Payer: 59 | Admitting: Licensed Clinical Social Worker

## 2023-09-25 ENCOUNTER — Ambulatory Visit: Payer: Self-pay | Admitting: Licensed Clinical Social Worker

## 2023-09-26 NOTE — Patient Instructions (Signed)
Visit Information  Thank you for taking time to visit with me today. Please don't hesitate to contact me if I can be of assistance to you.   Following are the goals we discussed today:   Goals Addressed             This Visit's Progress    LCSW-Management of MH/Stress   On track    Activities and task to complete in order to accomplish goals.   Keep all upcoming appointments discussed today Continue with compliance of taking medication prescribed by Doctor Implement healthy coping skills discussed to assist with management of symptoms Follow up with Department of Social Services          Our next appointment is by telephone on 11/14 at 12:30 pm  Please call the care guide team at [phone] if you need to cancel or reschedule your appointment.   If you are experiencing a Mental Health or Behavioral Health Crisis or need someone to talk to, please call the Suicide and Crisis Lifeline: 988 call 911   Patient verbalizes understanding of instructions and care plan provided today and agrees to view in MyChart. Active MyChart status and patient understanding of how to access instructions and care plan via MyChart confirmed with patient.     [name], MSW, LCSW [facility] Care Management [place]  [facility] [email or web address] Phone [phone] 12:48 PM

## 2023-09-26 NOTE — Patient Outreach (Signed)
  Care Coordination   Follow Up Visit Note   09/25/2023 Name: CHRISTINE PANZARELLA MRN: [id number] DOB: 1951-02-05  RYLENN VULGAMORE is a 72 y.o. year old female who sees Saguier, Ramon Dredge, [place] for primary care. I spoke with  [name] by phone today.  What matters to the patients health and wellness today?  Level of Care, Symptom management    Goals Addressed             This Visit's Progress    LCSW-Management of MH/Stress   On track    Activities and task to complete in order to accomplish goals.   Keep all upcoming appointments discussed today Continue with compliance of taking medication prescribed by Doctor Implement healthy coping skills discussed to assist with management of symptoms Follow up with Department of Social Services          SDOH assessments and interventions completed:  No     Care Coordination Interventions:  Yes, provided  Interventions Today    Flowsheet Row Most Recent Value  Chronic Disease   Chronic disease during today's visit Chronic Obstructive Pulmonary Disease (COPD)  General Interventions   General Interventions Discussed/Reviewed General Interventions Reviewed, Doctor Visits, Community Resources, Level of Care  [Pt reports concerns for her well-being at night due to not having an aid assigned. States she has episodes when she may choke on mucus resulting in difficulty breathing and ongoing fall risk. Discussed levels of care and options for add'l support]  Doctor Visits Discussed/Reviewed Doctor Visits Reviewed  Level of Care Personal Care Services  Mental Health Interventions   Mental Health Discussed/Reviewed Mental Health Reviewed, Coping Strategies, Anxiety  [Validation and encouragement provided regarding pt's anxiety due to chronic health conditions. Grounding strategies discussed. Encouraged pt's peer support worker, Byrd Hesselbach, to review various strategies to assist with symptom management]  Nutrition Interventions   Nutrition  Discussed/Reviewed Nutrition Reviewed  Pharmacy Interventions   Pharmacy Dicussed/Reviewed Pharmacy Topics Reviewed, Medication Adherence  Safety Interventions   Safety Discussed/Reviewed Safety Reviewed, Fall Risk, Home Safety  [Discussed strategies to promote safety with patient, CAP nurse, and Peer Support Worker, Maria]       Follow up plan: Follow up call scheduled for 2-4 weeks    Encounter Outcome:  Patient Visit Completed   [name], MSW, LCSW [facility] Care Management [facility] Health  [facility] [email or web address] Phone [phone] 12:47 PM

## 2023-10-03 ENCOUNTER — Other Ambulatory Visit: Payer: Self-pay | Admitting: Medical

## 2023-10-14 ENCOUNTER — Other Ambulatory Visit: Payer: Self-pay | Admitting: Pulmonary Disease

## 2023-10-16 ENCOUNTER — Telehealth: Payer: Self-pay

## 2023-10-17 MED ORDER — OXYCODONE HCL 5 MG PO TABS: 5.0000 mg | ORAL_TABLET | Freq: Three times a day (TID) | ORAL | 0 refills | Status: DC | PRN

## 2023-10-17 NOTE — Telephone Encounter (Signed)
PMP was Reviewed.  Oxycodone e-scribed to pharmacy.  Call placed to Ms. Livesay regarding the above, she verbalizes understanding.

## 2023-10-17 NOTE — Telephone Encounter (Signed)
Jill Spence call back about her refill. Per patient she has about 9 tabs on hand. Please send refill to mail order. Thank you.

## 2023-10-18 ENCOUNTER — Other Ambulatory Visit: Payer: Self-pay | Admitting: Medical

## 2023-10-18 ENCOUNTER — Ambulatory Visit: Payer: Self-pay | Admitting: Licensed Clinical Social Worker

## 2023-10-18 NOTE — Patient Instructions (Signed)
Visit Information  Thank you for taking time to visit with me today. Please don't hesitate to contact me if I can be of assistance to you.   Following are the goals we discussed today:   Goals Addressed             This Visit's Progress    LCSW-Management of MH/Stress   On track    Activities and task to complete in order to accomplish goals.   Keep all upcoming appointments discussed today Continue with compliance of taking medication prescribed by Doctor Implement healthy coping skills discussed to assist with management of symptoms Follow up with Department of Social Services  Follow up with [name] about additional benefits. Reference booklet for eligibility criteria Follow up with [name] [phone] at scheduled appt at 5 PM on 10/18/23         Please call the care guide team at [phone] if you need to cancel or reschedule your appointment.   If you are experiencing a Mental Health or Behavioral Health Crisis or need someone to talk to, please call the Suicide and Crisis Lifeline: 988 call 911   Patient verbalizes understanding of instructions and care plan provided today and agrees to view in MyChart. Active MyChart status and patient understanding of how to access instructions and care plan via MyChart confirmed with patient.     [name], MSW, LCSW [facility] Care Management [place]  [facility] [email or web address] Phone [phone] 12:59 PM

## 2023-10-18 NOTE — Patient Outreach (Signed)
  Care Coordination   Follow Up Visit Note   10/18/2023 Name: Jill Spence MRN: [id number] DOB: 03/14/1951  Jill Spence is a 72 y.o. year old female who sees Saguier, Ramon Dredge, [place] for primary care. I spoke with  Jill Spence by phone today.  What matters to the patients health and wellness today?  Symptom Management    Goals Addressed             This Visit's Progress    LCSW-Management of MH/Stress   On track    Activities and task to complete in order to accomplish goals.   Keep all upcoming appointments discussed today Continue with compliance of taking medication prescribed by Doctor Implement healthy coping skills discussed to assist with management of symptoms Follow up with Department of Social Services  Follow up with [name] about additional benefits. Reference booklet for eligibility criteria Follow up with Jill Spence [phone] at scheduled appt at 5 PM on 10/18/23         SDOH assessments and interventions completed:  No     Care Coordination Interventions:  Yes, provided  Interventions Today    Flowsheet Row Most Recent Value  Chronic Disease   Chronic disease during today's visit Chronic Obstructive Pulmonary Disease (COPD)  General Interventions   General Interventions Discussed/Reviewed General Interventions Reviewed, Doctor Visits, Level of Care, Communication with  [Patient reports difficulty managing chronic health conditions. Endorses difficulty breathing throughout the night triggered by ongoing draft in home, despite strategies to assist with temperature control]  Doctor Visits Discussed/Reviewed Doctor Visits Reviewed  Communication with --  [LCSW collaborated with [name] regarding pt's current benefits. Was informed patient is only eligible for Home Health services through [facility]. Call reference # [id number]. Pt will f/up with [name] due to conflicting information provided]  Level of Care Personal Care Services  Mental  Health Interventions   Mental Health Discussed/Reviewed Mental Health Reviewed, Coping Strategies, Anxiety  Pharmacy Interventions   Pharmacy Dicussed/Reviewed Pharmacy Topics Reviewed, Medication Adherence  [Patient reports taking her medications routinely to assist with symptom management]  Safety Interventions   Safety Discussed/Reviewed Safety Reviewed       Follow up plan: Follow up call scheduled for 2-4 weeks    Encounter Outcome:  Patient Visit Completed   [name], MSW, LCSW [facility] Care Management [facility]  [facility] [email or web address] Phone [phone] 12:58 PM

## 2023-10-24 ENCOUNTER — Encounter: Payer: 59 | Attending: Physical Medicine and Rehabilitation | Admitting: Registered Nurse

## 2023-10-24 VITALS — BP 126/77 | HR 70 | Ht 63.5 in | Wt 171.0 lb

## 2023-10-24 DIAGNOSIS — Z5181 Encounter for therapeutic drug level monitoring: Secondary | ICD-10-CM | POA: Diagnosis present

## 2023-10-24 DIAGNOSIS — Z79891 Long term (current) use of opiate analgesic: Secondary | ICD-10-CM

## 2023-10-24 DIAGNOSIS — M25561 Pain in right knee: Secondary | ICD-10-CM | POA: Diagnosis present

## 2023-10-24 DIAGNOSIS — M2550 Pain in unspecified joint: Secondary | ICD-10-CM | POA: Diagnosis present

## 2023-10-24 DIAGNOSIS — G8929 Other chronic pain: Secondary | ICD-10-CM | POA: Insufficient documentation

## 2023-10-24 DIAGNOSIS — G894 Chronic pain syndrome: Secondary | ICD-10-CM | POA: Diagnosis present

## 2023-10-24 DIAGNOSIS — G610 Guillain-Barre syndrome: Secondary | ICD-10-CM

## 2023-10-24 MED ORDER — OXYCODONE HCL 5 MG PO TABS: 5.0000 mg | ORAL_TABLET | Freq: Three times a day (TID) | ORAL | 0 refills | Status: DC | PRN

## 2023-10-24 NOTE — Progress Notes (Signed)
Subjective:    Patient ID: Jill Spence, female    DOB: 09-12-1951, 72 y.o.   MRN: [id number]  HPI: Jill Spence is a 72 y.o. female who returns for follow up appointment for chronic pain and medication refill. She states her pain is located in her right knee pain. She rates her pain 8. Her current exercise regime is walking and performing stretching exercises.  Jill Spence Morphine equivalent is 22.50 MME.   Oral Swab was Performed today.    Pain Inventory Average Pain 7 Pain Right Now 8 My pain is  throbbing  In the last 24 hours, has pain interfered with the following? General activity 7 Relation with others 0 Enjoyment of life 5 What TIME of day is your pain at its worst? evening and night Sleep (in general) Poor  Pain is worse with:  . Pain improves with: therapy/exercise and medication Relief from Meds: 5  Family History  Problem Relation Age of Onset   Cancer Father    Social History   Socioeconomic History   Marital status: Widowed    Spouse name: Not on file   Number of children: Not on file   Years of education: Not on file   Highest education level: Some college, no degree  Occupational History   Not on file  Tobacco Use   Smoking status: Former    Current packs/day: 0.00    Average packs/day: 0.3 packs/day for 6.0 years (1.5 ttl pk-yrs)    Types: Cigarettes    Start date: 10/13/2001    Quit date: 10/14/2007    Years since quitting: 16.0   Smokeless tobacco: Never  Vaping Use   Vaping status: Never Used  Substance and Sexual Activity   Alcohol use: No   Drug use: No   Sexual activity: Not on file  Other Topics Concern   Not on file  Social History Narrative   Not on file   Social Determinants of Health   Financial Resource Strain: Medium Risk (04/25/2023)   Overall Financial Resource Strain (CARDIA)    Difficulty of Paying Living Expenses: Somewhat hard  Food Insecurity: Food Insecurity Present (04/25/2023)   Hunger Vital Sign    Worried  About Running Out of Food in the Last Year: Sometimes true    Ran Out of Food in the Last Year: Sometimes true  Transportation Needs: No Transportation Needs (08/16/2023)   PRAPARE - Administrator, Civil Service (Medical): No    Lack of Transportation (Non-Medical): No  Physical Activity: Insufficiently Active (04/25/2023)   Exercise Vital Sign    Days of Exercise per Week: 1 day    Minutes of Exercise per Session: 30 min  Stress: Stress Concern Present (04/25/2023)   [employer] of Occupational Health - Occupational Stress Questionnaire    Feeling of Stress : To some extent  Social Connections: Moderately Isolated (04/25/2023)   Social Connection and Isolation Panel [NHANES]    Frequency of Communication with Friends and Family: More than three times a week    Frequency of Social Gatherings with Friends and Family: Once a week    Attends Religious Services: 1 to 4 times per year    Active Member of [employer] or Organizations: No    Attends Banker Meetings: Never    Marital Status: Widowed   Past Surgical History:  Procedure Laterality Date   BREAST SURGERY     COLONOSCOPY     EYE SURGERY Bilateral    cataract  surgery with lens implants   MANDIBLE RECONSTRUCTION     MINOR REMOVAL OF MANDIBULAR HARDWARE Right 10/15/2015   Procedure: MINOR REMOVAL OF Right MANDIBULAR HARDWARE;  Surgeon: Ocie Doyne, DDS;  Location: [facility] OR;  Service: Oral Surgery;  Laterality: Right;   TONSILLECTOMY     Past Surgical History:  Procedure Laterality Date   BREAST SURGERY     COLONOSCOPY     EYE SURGERY Bilateral    cataract surgery with lens implants   MANDIBLE RECONSTRUCTION     MINOR REMOVAL OF MANDIBULAR HARDWARE Right 10/15/2015   Procedure: MINOR REMOVAL OF Right MANDIBULAR HARDWARE;  Surgeon: Ocie Doyne, DDS;  Location: [facility] OR;  Service: Oral Surgery;  Laterality: Right;   TONSILLECTOMY     Past Medical History:  Diagnosis Date   Anemia    low iron   Anxiety     Arthritis    Bipolar disorder (HCC)    Cancer (HCC) 1973   COPD (chronic obstructive pulmonary disease) (HCC)    Family history of adverse reaction to anesthesia    mom "was put to sleep and she never woke up" -    GERD (gastroesophageal reflux disease)    Guillain Barr syndrome (HCC)    numbness in toes, legs hurt   Headache    migraines as a teenager   IBS (irritable bowel syndrome)    Lump, breast    removed years ago per Pt.   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, has "quiet' seizures   Thyroid disease    BP 126/77   Pulse 70   Ht 5' 3.5" (1.613 m)   Wt 171 lb (77.6 kg)   SpO2 96%   BMI 29.82 kg/m   Opioid Risk Score:   Fall Risk Score:  `1  Depression screen PHQ 2/9     05/02/2023    3:50 PM 04/19/2023    1:43 PM 02/20/2023    1:54 PM 12/07/2022    1:42 PM 10/05/2022    1:58 PM 08/10/2022    1:30 PM 05/09/2022    1:36 PM  Depression screen PHQ 2/9  Decreased Interest 0 0 0 0 0 1 0  Down, Depressed, Hopeless 0 0 0 0 0 1 0  PHQ - 2 Score 0 0 0 0 0 2 0      Review of Systems  Musculoskeletal:  Positive for back pain.       Knee pain  All other systems reviewed and are negative.     Objective:   Physical Exam Vitals and nursing note reviewed.  Constitutional:      Appearance: Normal appearance.  Cardiovascular:     Rate and Rhythm: Normal rate and regular rhythm.     Pulses: Normal pulses.     Heart sounds: Normal heart sounds.  Pulmonary:     Effort: Pulmonary effort is normal.     Breath sounds: Normal breath sounds.     Comments: Continuous Oxygen: 3 Liters Nasal Cannula Musculoskeletal:     Comments: Normal Muscle Bulk and Muscle Testing Reveals:  Upper Extremities: Full ROM and Muscle Strength 5/5  Lumbar Paraspinal Tenderness: L-3-L-5 Lower Extremities : Right: Decreased ROM and Muscle Strength 5/5 Right Lower Extremity Flexion Produces Pain into her right patella Left  Lower Extremity: Full ROM and Muscle Strength 5/5 Arrived in wheelchair    Skin:    General: Skin  is warm and dry.  Neurological:     Mental Status: She is alert and oriented to person, place, and time.  Psychiatric:        Mood and Affect: Mood normal.        Behavior: Behavior normal.         Assessment & Plan:  Guillain Barre Syndrome: Continue current medication regimen. Continue Outpatient Therapy. Continue current medication regimen. Continue to Monitor. 10/24/2023 Polyarthralgia: Continue current medication regimen . Continue to Monitor. 10/24/2023 Chronic Bilateral Low Back Pain: Continue HEP as Tolerated. Continue to Monitor. 10/24/2023 Chronic Pain of Right  Knee: Continue Outpatient Therapy. Continue current medication regimen. Continue to Monitor. 10/24/2023 Chronic Pain Syndrome:  Continue Oxycodone 5 mg three times a day as needed for pain. #90.Second script sent for the following month.  We will continue the opioid monitoring program, this consists of regular clinic visits, examinations, urine drug screen, pill counts as well as use of [place] Controlled Substance Reporting system. A 12 month History has been reviewed on the [place] Controlled Substance Reporting System on 10/24/2023   F/U in 2 months

## 2023-10-27 LAB — DRUG TOX MONITOR 1 W/CONF, ORAL FLD

## 2023-10-27 LAB — DRUG TOX ALC METAB W/CON, ORAL FLD: Alcohol Metabolite: NEGATIVE ng/mL (ref <25)

## 2023-10-28 ENCOUNTER — Encounter: Payer: Self-pay | Admitting: Registered Nurse

## 2023-11-03 ENCOUNTER — Other Ambulatory Visit: Payer: Self-pay | Admitting: Medical

## 2023-11-03 NOTE — Telephone Encounter (Signed)
Rx Bentyl sent to pt pharmacy.

## 2023-11-07 ENCOUNTER — Telehealth (INDEPENDENT_AMBULATORY_CARE_PROVIDER_SITE_OTHER): Payer: 59 | Admitting: Medical

## 2023-11-07 ENCOUNTER — Encounter: Payer: Self-pay | Admitting: Medical

## 2023-11-07 VITALS — BP 144/98 | HR 70 | Temp 97.5°F | Ht 63.5 in

## 2023-11-07 DIAGNOSIS — J40 Bronchitis, not specified as acute or chronic: Secondary | ICD-10-CM | POA: Diagnosis not present

## 2023-11-07 DIAGNOSIS — J449 Chronic obstructive pulmonary disease, unspecified: Secondary | ICD-10-CM

## 2023-11-07 DIAGNOSIS — R059 Cough, unspecified: Secondary | ICD-10-CM

## 2023-11-07 MED ORDER — BENZONATATE 100 MG PO CAPS: 100.0000 mg | ORAL_CAPSULE | Freq: Three times a day (TID) | ORAL | 0 refills | Status: DC | PRN

## 2023-11-07 MED ORDER — AZITHROMYCIN 250 MG PO TABS
ORAL_TABLET | ORAL | 0 refills | Status: AC
Start: 2023-11-07 — End: 2023-11-12

## 2023-11-07 MED ORDER — AZITHROMYCIN 250 MG PO TABS: ORAL_TABLET | ORAL | 0 refills | Status: DC

## 2023-11-07 NOTE — Progress Notes (Signed)
Subjective:    Patient ID: Jill Spence, female    DOB: 05-30-1951, 72 y.o.   MRN: [id number]  HPI Virtual Visit via Video Note  I connected with Jill Spence on 11/07/23 at  2:20 PM EST by a video enabled telemedicine application and verified that I am speaking with the correct person using two identifiers.  Location: Patient: home Provider: office   I discussed the limitations of evaluation and management by telemedicine and the availability of in person appointments. The patient expressed understanding and agreed to proceed.  History of Present Illness: Discussed the use of AI scribe software for clinical note transcription with the patient, who gave verbal consent to proceed.  History of Present Illness   The patient, with a history of stage three COPD, presents with a three-day history of a hoarse voice, sensation of a foreign body/mucus sensation in the throat, and increased coughing. She reports feeling as pressure in ear. The patient has been able to expectorate some mucus, which varies in color from yellow to brown to white, with the white mucus being less thick than the current mucus. She also reports occasional feelings of dizziness.  The patient has been using a nebulizer and albuterol inhaler, last used on Sunday, and continues to take Trelegy. She has not been consistently using her prescribed oxygen at home, but when checked during the consultation, her oxygen saturation was 97% on 3 liters of oxygen.  The patient also reports intermittent clammy sweats, particularly at night, but denies any fever. She lives in a cold environment, with the temperature in her bedroom often dropping significantly, requiring her to keep the heat at 81 degrees to be able to breathe comfortably. Despite this, she is able to sleep in the cold room.  The patient's symptoms have not improved significantly with the use of tea, and she has been coughing more than usual. She reports hearing  wheezing sounds when lying down, which come and go. She has not used the albuterol inhaler or nebulizer treatment since Sunday.        Observations/Objective: General-no acute distress, pleasant, oriented. Lungs- on inspection lungs appear unlabored. Neck- no tracheal deviation or jvd on inspection. Neuro- gross motor function appears intact.   Assessment and Plan: Assessment and Plan    Acute Bronchitis New onset of hoarseness, sensation of mucus in throat, and productive cough with colored mucus. History of COPD stage 3. No fever reported. Oxygen saturation at 97% on 3L. -Prescribe Azithromycin for 5 days. -Prescribe cough tablets benzonatate -Continue Trelegy as usual. -Continue Albuterol as needed. -Order chest x-ray to be done within the next 24 hours to 1 week.(stressed want it done in 24 hours but pt declines stating too cold and states won't get done sooner than tuesday). Explained benefit of getting done earlier in light of this being video visit and knowing her med hx.) -Schedule follow-up appointment next Thursday. -continue 3 L 02 as pulmonolgist rx'd.  Eustachian Tube Dysfunction Sensation of something in ears. -Consider over-the-counter Flonase for pressure relief if patient is willing to use.   Follow up next thursday with me or sooner if needed. if severe worse signs/symptom then be seen in ED.        Follow Up Instructions:    I discussed the assessment and treatment plan with the patient. The patient was provided an opportunity to ask questions and all were answered. The patient agreed with the plan and demonstrated an understanding of the instructions.   The  patient was advised to call back or seek an in-person evaluation if the symptoms worsen or if the condition fails to improve as anticipated.     Esperanza Richters, PA-C    Review of Systems     Objective:   Physical Exam        Assessment & Plan:

## 2023-11-07 NOTE — Patient Instructions (Signed)
Acute Bronchitis New onset of hoarseness, sensation of mucus in throat, and productive cough with colored mucus. History of COPD stage 3. No fever reported. Oxygen saturation at 97% on 3L. -Prescribe Azithromycin for 5 days. -Prescribe cough tablets benzonatate -Continue Trelegy as usual. -Continue Albuterol as needed. -Order chest x-ray to be done within the next 24 hours to 1 week.(stressed want it done in 24 hours but pt declines stating too cold and states won't get done sooner than tuesday). Explained benefit of getting done earlier in light of this being video visit and knowing her med hx.) -Schedule follow-up appointment next Thursday. -continue 3 L 02 as pulmonolgist rx'd.  Eustachian Tube Dysfunction Sensation of something in ears. -Consider over-the-counter Flonase for pressure relief if patient is willing to use.   Follow up next thursday with me or sooner if needed. if severe worse signs/symptom then be seen in ED.

## 2023-11-12 ENCOUNTER — Ambulatory Visit (HOSPITAL_BASED_OUTPATIENT_CLINIC_OR_DEPARTMENT_OTHER): Payer: 59

## 2023-11-13 ENCOUNTER — Other Ambulatory Visit: Payer: Self-pay | Admitting: Registered Nurse

## 2023-11-13 ENCOUNTER — Other Ambulatory Visit: Payer: Self-pay | Admitting: Medical

## 2023-11-14 NOTE — Telephone Encounter (Signed)
PMP was Reviewed.  Oxycodone e-scribed to pharmacy.

## 2023-11-15 ENCOUNTER — Telehealth: Payer: Self-pay | Admitting: Medical

## 2023-11-15 NOTE — Addendum Note (Signed)
Addended by: Gwenevere Abbot on: 11/15/2023 09:00 PM   Modules accepted: Orders

## 2023-11-15 NOTE — Telephone Encounter (Signed)
Prescription Request  11/15/2023  Is this a "Controlled Substance" medicine? No  LOV: 08/10/2023  What is the name of the medication or equipment? Vitamin D, Ergocalciferol, (DRISDOL) 1.25 MG  Have you contacted your pharmacy to request a refill? No   Which pharmacy would you like this sent to?  [facility] [address], [place] - [address] [phone] [place] [address] Phone: [phone] Fax: [phone]    Patient notified that their request is being sent to the clinical staff for review and that they should receive a response within 2 business days.   Please advise at [facility] [phone]

## 2023-11-16 MED ORDER — VITAMIN D (ERGOCALCIFEROL) 1.25 MG (50000 UNIT) PO CAPS: 50000.0000 [IU] | ORAL_CAPSULE | ORAL | 0 refills | Status: DC

## 2023-11-16 NOTE — Addendum Note (Signed)
Addended by: Gwenevere Abbot on: 11/16/2023 05:41 PM   Modules accepted: Orders

## 2023-11-16 NOTE — Telephone Encounter (Signed)
Refilled pt vit d. Levels are normal. And on review looks like has been getting weekly vit d rx in past(appears mid norma level due to supplamentation). If not covered by insurance then would advise her to get vit otc 4000 international units daily tab going forward.

## 2023-11-22 ENCOUNTER — Encounter: Payer: Self-pay | Admitting: *Deleted

## 2023-11-22 ENCOUNTER — Encounter: Payer: Self-pay | Admitting: Pulmonary Disease

## 2023-11-22 ENCOUNTER — Ambulatory Visit (INDEPENDENT_AMBULATORY_CARE_PROVIDER_SITE_OTHER): Payer: 59 | Admitting: Pulmonary Disease

## 2023-11-22 ENCOUNTER — Telehealth: Payer: Self-pay

## 2023-11-22 VITALS — BP 139/83 | HR 67 | Temp 98.1°F | Ht 63.5 in | Wt 175.4 lb

## 2023-11-22 DIAGNOSIS — J439 Emphysema, unspecified: Secondary | ICD-10-CM | POA: Diagnosis not present

## 2023-11-22 DIAGNOSIS — J4489 Other specified chronic obstructive pulmonary disease: Secondary | ICD-10-CM

## 2023-11-22 DIAGNOSIS — R911 Solitary pulmonary nodule: Secondary | ICD-10-CM

## 2023-11-22 NOTE — Progress Notes (Signed)
Jill Spence    [id number]    1951-09-27  Primary Care Physician:Saguier, Kateri Mc  Referring Physician: Esperanza Richters, PA-C [address] [place],  [place] [address]  Chief complaint: Follow-up for COPD  HPI: 72 y.o. with history of COPD on supplemental oxygen, lung nodules.   Maintained on Breo and now Trelegy inhaler.  She has had recurrent exacerbations over the past year requiring prednisone, antibiotics. She is also being followed with serial CT scans for lung nodule  States that she was hospitalized for COVID-19 at [facility] in early 2021 but I do not have any record of those. Diagnosed with multinodular goiter and hypothyroidism in 2022.  She is following with endocrinology and is on methimazole  2022 She had a CT chest and PET scan for evaluation of lung nodules PET scan showed hypermetabolic them in the right axilla.  She has been seen at dermatology and this lesion excised.  I have reviewed the previous notes from dermatology As per the patient and the final biopsy report from September 15 is benign keloid  She also had a follow-up CT chest which showed improvement in lung nodule  Pets: Has a dog Occupation: Retired Midwife Exposures: Reports mold in the previous apartment.  No current mold exposure.  No hot tub, Jacuzzi Smoking history: States that she quit smoking in 2008.  Cannot give me a clear idea of how much she smoked prior Travel history: No significant travel history Relevant family history: No significant family history of lung disease  Interim history: Discussed the use of AI scribe software for clinical note transcription with the patient, who gave verbal consent to proceed.  The patient, with a history of severe COPD, presents with worsening symptoms. She reports difficulty breathing, particularly at night, and describes a sensation of thick mucus in her throat that sometimes impedes her ability to breathe  through her nose. The patient's caregiver notes that the patient's oxygen levels decrease significantly during movement, leading to weakness and potential risk of passing out. The patient is currently on a regimen of Trelegy, a nebulizer, and occasional prednisone, but reports difficulty with the inhaler and is not taking the prednisone regularly due to concerns about long-term steroid use. The patient also mentions a history of receiving steroid and antibiotic injections from a previous doctor, which she believes were beneficial. The patient is scheduled for a minor surgery to remove a benign growth on her ear and expresses concern about potential interactions with her current medications.   Outpatient Encounter Medications as of 11/22/2023  Medication Sig   albuterol (PROVENTIL) (2.5 MG/3ML) 0.083% nebulizer solution Take 2.5 mg by nebulization every 6 (six) hours as needed for wheezing.   albuterol (VENTOLIN HFA) 108 (90 Base) MCG/ACT inhaler Inhale 2 puffs into the lungs every 6 (six) hours as needed for wheezing.   alclomethasone (ACLOVATE) 0.05 % cream Apply topically 2 (two) times daily as needed.   alendronate (FOSAMAX) 70 MG tablet Take 70 mg by mouth every Sunday. Take with a full glass of water on an empty stomach.   ARIPiprazole (ABILIFY) 15 MG tablet Take 15 mg by mouth daily.   B Complex Vitamins (VITAMIN B COMPLEX) TABS Take 1 tablet by mouth daily.   benzonatate (TESSALON) 100 MG capsule TAKE ONE CAPSULE BY MOUTH THREE TIMES DAILY AS NEEDED FOR COUGH   benzonatate (TESSALON) 100 MG capsule Take 1 capsule (100 mg total) by mouth 3 (  three) times daily as needed for cough.   benztropine (COGENTIN) 0.5 MG tablet Take 0.5 mg by mouth 2 (two) times daily.   Bepotastine Besilate (BEPREVE) 1.5 % SOLN Place 1 drop into both eyes 2 (two) times daily as needed.   Bepotastine Besilate 1.5 % SOLN Apply to eye.   cromolyn (OPTICROM) 4 % ophthalmic solution 1 drop 4 (four) times daily.   diclofenac  Sodium (VOLTAREN ARTHRITIS PAIN) 1 % GEL Apply 4 g topically 4 (four) times daily.   dicyclomine (BENTYL) 10 MG capsule TAKE ONE CAPSULE BY MOUTH THREE TIMES DAILY BEFORE MEALS.   dicyclomine (BENTYL) 20 MG tablet Take 20 mg by mouth 3 (three) times daily as needed.   doxycycline (ADOXA) 100 MG tablet Take 1 tablet by mouth daily.   doxycycline (VIBRA-TABS) 100 MG tablet Take 1 tablet (100 mg total) by mouth 2 (two) times daily.   erythromycin ophthalmic ointment Apply to eye.   Fluticasone-Umeclidin-Vilant (TRELEGY ELLIPTA) 200-62.5-25 MCG/ACT AEPB Inhale 1 puff into the lungs daily.   hydrOXYzine (ATARAX) 10 MG tablet Take 1-2 tablets (10-20 mg total) by mouth at bedtime as needed for anxiety (insomnia).   ibuprofen (ADVIL) 800 MG tablet TAKE ONE TABLET BY MOUTH EVERY 8 HOURS AS NEEDED FOR MODERATE PAIN   ipratropium-albuterol (DUONEB) 0.5-2.5 (3) MG/3ML SOLN Take 3 mLs by nebulization every 4 (four) hours as needed.   levocetirizine (XYZAL) 5 MG tablet Take 1 tablet (5 mg total) by mouth every evening.   loperamide (IMODIUM) 2 MG capsule TAKE ONE CAPSULE BY MOUTH EVERY 6 TO 8 HOURS AS NEEDED FOR DIARRHEA   magnesium oxide (MAG-OX) 400 MG tablet Take 250 mg by mouth daily.   methimazole (TAPAZOLE) 5 MG tablet Take 1 tablet (5 mg total) by mouth daily.   montelukast (SINGULAIR) 10 MG tablet Take 1 tablet (10 mg total) by mouth at bedtime.   NONFORMULARY OR COMPOUNDED ITEM Elevated toilet seat   olopatadine (PATANOL) 0.1 % ophthalmic solution Place 1 drop into both eyes 2 (two) times daily.   oxcarbazepine (TRILEPTAL) 600 MG tablet Take 600 mg by mouth 2 (two) times daily.   oxyCODONE (ROXICODONE) 5 MG immediate release tablet Take 1 tablet (5 mg total) by mouth 3 (three) times daily as needed for moderate pain (pain score 4-6).   pantoprazole (PROTONIX) 40 MG tablet Take 1 tablet (40 mg total) by mouth daily.   predniSONE (DELTASONE) 10 MG tablet TAKE 1 TABLET (10 MG TOTAL) BY MOUTH DAILY WITH  BREAKFAST.   sodium chloride HYPERTONIC 3 % nebulizer solution 3 ml via neb twice daily   traZODone (DESYREL) 50 MG tablet Take 50 mg by mouth at bedtime.   triamcinolone cream (KENALOG) 0.1 % Apply topically 2 (two) times daily as needed.   Vitamin D, Ergocalciferol, (DRISDOL) 1.25 MG (50000 UNIT) CAPS capsule TAKE ONE CAPSULE BY MOUTH ONCE WEEKLY FOR SIX WEEKS   Vitamin D, Ergocalciferol, (DRISDOL) 1.25 MG (50000 UNIT) CAPS capsule Take 1 capsule (50,000 Units total) by mouth every 7 (seven) days.   gabapentin (NEURONTIN) 400 MG capsule Take 3 capsules (1,200 mg total) by mouth 3 (three) times daily.   potassium chloride SA (KLOR-CON) 20 MEQ tablet Take 1 tablet (20 mEq total) by mouth 2 (two) times daily for 7 days.   No facility-administered encounter medications on file as of 11/22/2023.   Physical Exam: Blood pressure 139/83, pulse 67, temperature 98.1 F (36.7 C), temperature source Oral, height 5' 3.5" (1.613 m), weight 175 lb 6.4 oz (  79.6 kg), SpO2 99%. Gen:      No acute distress HEENT:  EOMI, sclera anicteric Neck:     No masses; no thyromegaly Lungs:    Clear to auscultation bilaterally; normal respiratory effort CV:         Regular rate and rhythm; no murmurs Abd:      + bowel sounds; soft, non-tender; no palpable masses, no distension Ext:    No edema; adequate peripheral perfusion Skin:      Warm and dry; no rash Neuro: alert and oriented x 3 Psych: normal mood and affect   Data Reviewed: Imaging: CT chest 03/29/2020-cirrhosis, thyroid nodule, severe emphysema  A new 4 mm nodule is seen along the minor fissure (3/61). 6 mm nodule in the lateral  segment right middle lobe (3/69) has enlarged from 3 mm on  01/01/2019. 2 mm anterior right lower lobe nodule (3/76) is new.  Scarring in the apex of the left upper lobe with a new nodular  component measuring 6 mm (3/12). 3 mm lingular nodule (3/63) is new.  Additional tiny pulmonary nodules in the left lung are stable. No   pleural fluid. Airway is unremarkable.   CT chest 07/14/2020-new 5 mm nodule in the right middle lobe.  Remainder of the lung nodules are stable.  Enlarged pulmonary trunk  CT chest 10/20/2020-stable right middle lobe nodule, thyroid nodule, aortic atherosclerosis, emphysema  CT chest 04/14/2021-interval development of pulmonary nodules  PET scan 04/29/2021-12 mm lesion along the left anterior shoulder/axilla.  Multiple bilateral nodules up to 10 mm.  Super D CT 08/02/2021-recently noted pulm nodules are stable to decreased in size.  CT chest 04/10/2022-stable pulm nodules, severe emphysema, coronary artery disease  CT chest 05/17/2023-no interstitial lung disease, multiple lung nodules increase in size, severe emphysema, aortic atherosclerosis, coronary artery disease  PET scan 06/06/2023-no significant uptake in the previously noted lung nodules.  She had a new irregular pulmonary nodule in the left lower lobe which needs follow-up Hypermetabolic left thyroid lobe correlating with previously performed thyroid ultrasound  CT chest 09/26/2023-waxing and waning lung nodules, thyroid nodule. I have reviewed the images personally.  PFTs: 01/26/2021 FVC 1.68 [73%], FEV1 0.72 [40%], TLC 6.19 [126%], DLCO 5.09 [26%] Severe obstruction with air trapping and hyperinflation, severe diffusion defect  06/06/2023 FVC 1.62 [56%], FEV1 0.61 [28%], F/F38, TLC 6.92 [141%], DLCO 4.21 [22%] Severe obstruction with hyperinflation and diffusion defect  Labs: CBC 07/27/2020-WBC 7.3, eos 1%, absolute eosinophil count 73 IgE 07/27/2020-5  Alpha-1 antitrypsin 08/28/2028-170, PIMM  Assessment:  Severe COPD with exacerbations Has worsening dyspnea with chest congestion.  She feels that her current treatment regimen is not working for her. Echo is normal with no evidence of interstitial lung disease on CT scan PFTs do show worsening obstruction and I suspect her presentation is just due to COPD  Did not like  Breztri and is back on Trelegy Continue duo nebs She is also on chronic prednisone at 10 mg/day which she uses intermittently Mucinex, flutter valve for mucociliary clearance, add percussion vest She is continuing with physical therapy.  Declined pulmonary rehab. We discussed possible lung volume reduction surgery but she does not want to go through that  Experiencing significant shortness of breath, difficulty with mobility, and thick mucus production. Currently on Trelegy, nebulizer treatments, and intermittent prednisone. Discussed the challenges with the current inhaler and the option of switching to a spray, but patient prefers to continue with Trelegy. Discussed the addition of a new nebulizer medication, Ohtuvayre  -Continue  Trelegy inhaler. -Continue nebulizer treatments. -Continue prednisone 10mg  daily as needed for severe symptoms. -Initiate application for Ohtuvayre treatment. -Provide a new flutter valve to assist with mucus clearance. -Recommend over-the-counter Mucinex to assist with mucus clearance.  Home Care Patient requires assistance at home due to severe COPD and mobility issues. Discussed the need for nighttime assistance. -Write a letter to [name] and [employer] stating the patient's need for nighttime assistance due to severe COPD.  Upcoming Dermatology Procedure Patient has a non-cancerous growth on the ear that will be removed under local anesthesia. Concerns about potential interactions with current medications. -Confirm that local anesthesia for dermatology procedure should not interfere with current COPD medications.  Lung Nodules Patient has benign-appearing lung nodules that come and go, likely related to inflammation. -No specific intervention needed at this time.  Allergies, nasal congestion Continue Xyzal, Singulair  Plan/Recommendations: Continue Trelegy, albuterol, DuoNebs Mucociliary clearance with Mucinex, flutter valve Xyzal,  Singulair Prednisone 10 mg a day-using intermittently Everette Rank MD [facility] 11/22/2023, 1:45 PM  CC: Esperanza Richters, PA-C

## 2023-11-22 NOTE — Patient Instructions (Signed)
VISIT SUMMARY:  During today's visit, we discussed your worsening COPD symptoms, including difficulty breathing, thick mucus, and decreased oxygen levels during movement. We also addressed your upcoming minor surgery to remove a benign growth on your ear and your concerns about potential medication interactions. Additionally, we talked about your home care needs and issues with drinking tap water.  YOUR PLAN:  -SEVERE COPD: Chronic Obstructive Pulmonary Disease (COPD) is a long-term lung condition that makes it hard to breathe. We will continue your current medications, including Trelegy and nebulizer treatments, and add a new nebulizer medication called Duvary. You should also continue taking prednisone 10mg  daily as needed for severe symptoms. To help with mucus clearance, we will provide a new flutter valve and recommend over-the-counter Mucinex.  -HOME CARE: Due to your severe COPD and mobility issues, you need assistance at home, especially at night. We will write a letter to [name] and Medicaid to state your need for nighttime assistance.  -UPCOMING DERMATOLOGY PROCEDURE: You have a non-cancerous growth on your ear that will be removed under local anesthesia. This procedure should not interfere with your current COPD medications.  -LUNG NODULES: You have benign lung nodules that are likely related to inflammation. No specific intervention is needed at this time.  -WATER QUALITY: You reported issues with drinking tap water. We recommend purchasing a water filter for home use.  INSTRUCTIONS:  Please continue with your current medications and follow the new recommendations provided. We will initiate the application for the [facility] nebulizer treatment and provide you with a new flutter valve. Use over-the-counter Mucinex to help with mucus clearance. We will also write a letter to [name] and [place] regarding your need for nighttime assistance. Your upcoming dermatology  procedure should not interfere with your COPD medications. Consider purchasing a water filter for better water quality at home.

## 2023-11-23 ENCOUNTER — Ambulatory Visit: Payer: Self-pay | Admitting: Licensed Clinical Social Worker

## 2023-11-23 NOTE — Patient Instructions (Signed)
Visit Information  Thank you for taking time to visit with me today. Please don't hesitate to contact me if I can be of assistance to you.   Following are the goals we discussed today:   Goals Addressed             This Visit's Progress    LCSW-Management of MH/Stress   On track    Activities and task to complete in order to accomplish goals.   Keep all upcoming appointments discussed today Continue with compliance of taking medication prescribed by Doctor Implement healthy coping skills discussed to assist with management of symptoms Follow up with Department of Social Services  Follow up with [name] about additional benefits. Reference booklet for eligibility criteria Follow up with [name] [phone] at scheduled appt at 5 PM on 10/18/23         Our next appointment is by telephone on 1/31 at 1 PM  Please call the care guide team at [phone] if you need to cancel or reschedule your appointment.   If you are experiencing a Mental Health or Behavioral Health Crisis or need someone to talk to, please call the Suicide and Crisis Lifeline: 988 call 911   Patient verbalizes understanding of instructions and care plan provided today and agrees to view in MyChart. Active MyChart status and patient understanding of how to access instructions and care plan via MyChart confirmed with patient.     [name], MSW, LCSW [facility] Care Management [place]  [facility] [email or web address] Phone [phone] 5:08 PM

## 2023-11-23 NOTE — Patient Outreach (Signed)
  Care Coordination   Follow Up Visit Note   11/23/2023 Name: Jill Spence MRN: [id number] DOB: [birth year]  Jill Spence is a 72 y.o. year old female who sees Saguier, Ramon Dredge, [place] for primary care. I spoke with  [name] by phone today.  What matters to the patients health and wellness today?  Level of Care    Goals Addressed             This Visit's Progress    LCSW-Management of MH/Stress   On track    Activities and task to complete in order to accomplish goals.   Keep all upcoming appointments discussed today Continue with compliance of taking medication prescribed by Doctor Implement healthy coping skills discussed to assist with management of symptoms Follow up with Department of Social Services  Follow up with [name] about additional benefits. Reference booklet for eligibility criteria Follow up with [name] [phone] at scheduled appt at 5 PM on 10/18/23         SDOH assessments and interventions completed:  No     Care Coordination Interventions:  Yes, provided  Interventions Today    Flowsheet Row Most Recent Value  Chronic Disease   Chronic disease during today's visit Chronic Obstructive Pulmonary Disease (COPD)  General Interventions   General Interventions Discussed/Reviewed General Interventions Reviewed, Doctor Visits, Level of Care, Communication with  Doctor Visits Discussed/Reviewed Doctor Visits Reviewed  Communication with PCP/Specialists  [LCSW collaborated with PCP regarding pt's request for a letter regarding conditions to assist with obtaining an aid during night]  Level of Care Personal Care Services  Mental Health Interventions   Mental Health Discussed/Reviewed Mental Health Reviewed  Safety Interventions   Safety Discussed/Reviewed Safety Reviewed       Follow up plan: Follow up call scheduled for 2-4 weeks    Encounter Outcome:  Patient Visit Completed   [name], MSW, LCSW [facility] Care  Management [facility]  [facility] [email or web address] Phone [phone] 5:07 PM

## 2023-11-27 ENCOUNTER — Telehealth: Payer: Self-pay | Admitting: Pharmacist

## 2023-11-27 NOTE — Telephone Encounter (Signed)
Received new start paperwork for [facility]. Faxed to [employer] with clinicals and insurance card copy  Phone#: [phone] Fax#: [phone]

## 2023-11-29 ENCOUNTER — Other Ambulatory Visit: Payer: Self-pay | Admitting: Pulmonary Disease

## 2023-11-29 ENCOUNTER — Other Ambulatory Visit: Payer: Self-pay | Admitting: Medical

## 2023-12-03 ENCOUNTER — Telehealth: Payer: Self-pay | Admitting: Pulmonary Disease

## 2023-12-03 MED ORDER — ALBUTEROL SULFATE (2.5 MG/3ML) 0.083% IN NEBU: 2.5000 mg | INHALATION_SOLUTION | Freq: Four times a day (QID) | RESPIRATORY_TRACT | 2 refills | Status: DC | PRN

## 2023-12-03 MED ORDER — ALBUTEROL SULFATE HFA 108 (90 BASE) MCG/ACT IN AERS: 2.0000 | INHALATION_SPRAY | Freq: Four times a day (QID) | RESPIRATORY_TRACT | 11 refills | Status: DC | PRN

## 2023-12-03 NOTE — Telephone Encounter (Signed)
Patient was in office last week and has not received her medications. Pharmacy does not have them either. Albuterol inhaler, albuterol solution for nebulizer and emergency inhaler.   Pharmacy: [facility] in [place]

## 2023-12-03 NOTE — Telephone Encounter (Signed)
Confirmed medications with Tandy Gaw on DPR. Albuterol inhaler and solution sent to [facility] pharmacy. Nothing further needed.

## 2023-12-05 ENCOUNTER — Other Ambulatory Visit: Payer: Self-pay | Admitting: Registered Nurse

## 2023-12-07 DIAGNOSIS — G610 Guillain-Barre syndrome: Secondary | ICD-10-CM | POA: Diagnosis not present

## 2023-12-07 DIAGNOSIS — K219 Gastro-esophageal reflux disease without esophagitis: Secondary | ICD-10-CM | POA: Diagnosis not present

## 2023-12-09 DIAGNOSIS — J449 Chronic obstructive pulmonary disease, unspecified: Secondary | ICD-10-CM | POA: Diagnosis not present

## 2023-12-10 ENCOUNTER — Telehealth: Payer: Self-pay

## 2023-12-10 NOTE — Telephone Encounter (Signed)
 Pt is scheduled to see Waynetta Sandy, NP on 02-20-24. Pt will need a MD to over look her chart as well. Dr Isaiah Serge, who do you advise be the doctor to over look for this pt?

## 2023-12-10 NOTE — Telephone Encounter (Signed)
 Patient notified

## 2023-12-10 NOTE — Telephone Encounter (Signed)
 Patient called for a refill on Oxycodone last filled 11/19/23. Patient is scheduled to come in person for appointment tomorrow. She states she is having problems breathing and her provider advice to stay home. She is requesting a MyChart video visit.

## 2023-12-10 NOTE — Telephone Encounter (Signed)
 Pt has requested to see another provider in the office.

## 2023-12-11 ENCOUNTER — Encounter: Payer: 59 | Attending: Physical Medicine and Rehabilitation | Admitting: Registered Nurse

## 2023-12-11 VITALS — Ht 63.0 in | Wt 172.0 lb

## 2023-12-11 DIAGNOSIS — M5412 Radiculopathy, cervical region: Secondary | ICD-10-CM | POA: Insufficient documentation

## 2023-12-11 DIAGNOSIS — M25561 Pain in right knee: Secondary | ICD-10-CM | POA: Insufficient documentation

## 2023-12-11 DIAGNOSIS — M542 Cervicalgia: Secondary | ICD-10-CM | POA: Diagnosis not present

## 2023-12-11 DIAGNOSIS — Z79891 Long term (current) use of opiate analgesic: Secondary | ICD-10-CM | POA: Insufficient documentation

## 2023-12-11 DIAGNOSIS — M25562 Pain in left knee: Secondary | ICD-10-CM | POA: Insufficient documentation

## 2023-12-11 DIAGNOSIS — G894 Chronic pain syndrome: Secondary | ICD-10-CM | POA: Insufficient documentation

## 2023-12-11 DIAGNOSIS — G8929 Other chronic pain: Secondary | ICD-10-CM | POA: Insufficient documentation

## 2023-12-11 DIAGNOSIS — G610 Guillain-Barre syndrome: Secondary | ICD-10-CM | POA: Diagnosis not present

## 2023-12-11 DIAGNOSIS — Z5181 Encounter for therapeutic drug level monitoring: Secondary | ICD-10-CM | POA: Diagnosis not present

## 2023-12-11 MED ORDER — OXYCODONE HCL 5 MG PO TABS: 5.0000 mg | ORAL_TABLET | Freq: Three times a day (TID) | ORAL | 0 refills | Status: DC | PRN

## 2023-12-11 NOTE — Progress Notes (Signed)
 Subjective:    Patient ID: Jill Spence, female    DOB: [birth year], 73 y.o.   MRN: [id number]  HPI: Jill Spence is a 73 y.o. female who is scheduled for Virtual Visit, she called office asking for Virtual Visit due to the weather.  I connected with [name] by a video enabled telemedicine application and verified that I am speaking with the correct person using two identifiers.  Location: Patient: In her Home Provider: In the Office    I discussed the limitations of evaluation and management by telemedicine and the availability of in person appointments. The patient expressed understanding and agreed to proceed.  Jill Spence has a follow up virtual visit for chronic pain and medication refill. She states her pain is located in her neck radiating into her right shoulder and bilateral knees R>L. She rates her pain 10. Her current exercise regime is walking and performing stretching exercises.  Jill Spence Morphine equivalent is 22.50 MME. Oral Swab was Performed on 10/24/2023, No Drugs detected. Jill Spence reports she was on ABT's for Lung infection and she was only taking her Oxycodone  sparingly.    Pain Inventory Average Pain 10 Pain Right Now 10 My pain is  throbbing  In the last 24 hours, has pain interfered with the following? General activity 5 Relation with others 5 Enjoyment of life 5 What TIME of day is your pain at its worst? morning , daytime, evening, and night Sleep (in general) Poor  Pain is worse with: walking, bending, sitting, standing, and some activites Pain improves with: rest and medication Relief from Meds: 5  Family History  Problem Relation Age of Onset   Cancer Father    Social History   Socioeconomic History   Marital status: Widowed    Spouse name: Not on file   Number of children: Not on file   Years of education: Not on file   Highest education level: Some college, no degree  Occupational History   Not on file  Tobacco Use    Smoking status: Former    Current packs/day: 0.00    Average packs/day: 0.3 packs/day for 6.0 years (1.5 ttl pk-yrs)    Types: Cigarettes    Start date: 10/13/2001    Quit date: 10/14/2007    Years since quitting: 16.1   Smokeless tobacco: Never  Vaping Use   Vaping status: Never Used  Substance and Sexual Activity   Alcohol use: No   Drug use: No   Sexual activity: Not on file  Other Topics Concern   Not on file  Social History Narrative   Not on file   Social Drivers of Health   Financial Resource Strain: Medium Risk (04/25/2023)   Overall Financial Resource Strain (CARDIA)    Difficulty of Paying Living Expenses: Somewhat hard  Food Insecurity: Food Insecurity Present (04/25/2023)   Hunger Vital Sign    Worried About Running Out of Food in the Last Year: Sometimes true    Ran Out of Food in the Last Year: Sometimes true  Transportation Needs: No Transportation Needs (08/16/2023)   PRAPARE - Administrator, Civil Service (Medical): No    Lack of Transportation (Non-Medical): No  Physical Activity: Insufficiently Active (04/25/2023)   Exercise Vital Sign    Days of Exercise per Week: 1 day    Minutes of Exercise per Session: 30 min  Stress: Stress Concern Present (04/25/2023)   [employer] of Occupational Health - Occupational Stress Questionnaire  Feeling of Stress : To some extent  Social Connections: Moderately Isolated (04/25/2023)   Social Connection and Isolation Panel [NHANES]    Frequency of Communication with Friends and Family: More than three times a week    Frequency of Social Gatherings with Friends and Family: Once a week    Attends Religious Services: 1 to 4 times per year    Active Member of [employer] or Organizations: No    Attends Banker Meetings: Never    Marital Status: Widowed   Past Surgical History:  Procedure Laterality Date   BREAST SURGERY     COLONOSCOPY     EYE SURGERY Bilateral    cataract surgery with lens  implants   MANDIBLE RECONSTRUCTION     MINOR REMOVAL OF MANDIBULAR HARDWARE Right 10/15/2015   Procedure: MINOR REMOVAL OF Right MANDIBULAR HARDWARE;  Surgeon: Ascencion Lava, DDS;  Location: [facility] OR;  Service: Oral Surgery;  Laterality: Right;   TONSILLECTOMY     Past Surgical History:  Procedure Laterality Date   BREAST SURGERY     COLONOSCOPY     EYE SURGERY Bilateral    cataract surgery with lens implants   MANDIBLE RECONSTRUCTION     MINOR REMOVAL OF MANDIBULAR HARDWARE Right 10/15/2015   Procedure: MINOR REMOVAL OF Right MANDIBULAR HARDWARE;  Surgeon: Ascencion Lava, DDS;  Location: [facility] OR;  Service: Oral Surgery;  Laterality: Right;   TONSILLECTOMY     Past Medical History:  Diagnosis Date   Anemia    low iron   Anxiety    Arthritis    Bipolar disorder (HCC)    Cancer (HCC) 1973   COPD (chronic obstructive pulmonary disease) (HCC)    Family history of adverse reaction to anesthesia    mom "was put to sleep and she never woke up" -    GERD (gastroesophageal reflux disease)    Guillain Barr syndrome (HCC)    numbness in toes, legs hurt   Headache    migraines as a teenager   IBS (irritable bowel syndrome)    Lump, breast    removed years ago per Pt.   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, has "quiet' seizures   Thyroid  disease    Ht 5\' 3"  (1.6 m)   BMI 31.07 kg/m   Opioid Risk Score:   Fall Risk Score:  `1  Depression screen PHQ 2/9     05/02/2023    3:50 PM 04/19/2023    1:43 PM 02/20/2023    1:54 PM 12/07/2022    1:42 PM 10/05/2022    1:58 PM 08/10/2022    1:30 PM 05/09/2022    1:36 PM  Depression screen PHQ 2/9  Decreased Interest 0 0 0 0 0 1 0  Down, Depressed, Hopeless 0 0 0 0 0 1 0  PHQ - 2 Score 0 0 0 0 0 2 0      Review of Systems  Musculoskeletal:  Positive for gait problem and neck pain.  All other systems reviewed and are negative.     Objective:   Physical Exam Vitals  and nursing note reviewed.  Musculoskeletal:     Comments: No Physical Exam Perform: Virtual Visit          Assessment & Plan:  Guillain Barre Syndrome: Continue current medication regimen. Continue Outpatient Therapy. Continue current medication regimen. Continue to Monitor. 12/11/2023 Polyarthralgia: Continue  current medication regimen . Continue to Monitor. 12/11/2023 Chronic Bilateral Low Back Pain: Continue HEP as Tolerated. Continue to Monitor. 12/11/2023 Chronic Pain of Bilateral Knee Pain R>L: Continue Outpatient Therapy. Continue current medication regimen. Continue to Monitor. 12/11/2023 Chronic Pain Syndrome:  Continue Oxycodone  5 mg three times a day as needed for pain. #90.Second script sent for the following month.  We will continue the opioid monitoring program, this consists of regular clinic visits, examinations, urine drug screen, pill counts as well as use of [place]  Controlled Substance Reporting system. A 12 month History has been reviewed on the [place]  Controlled Substance Reporting System on 12/11/2023   F/U in 2 months Virtual Visit Establish Patient Location of Patient: In her Home Location of Provider: In the Office

## 2023-12-14 ENCOUNTER — Other Ambulatory Visit: Payer: Self-pay | Admitting: Medical

## 2023-12-14 NOTE — Telephone Encounter (Unsigned)
 Copied from CRM [id number]. Topic: Clinical - Medication Refill >> Dec 14, 2023  1:38 PM [name] wrote: Most Recent Primary Care Visit:  Provider: Sylvia Everts  Department: [facility]  Visit Type: MYCHART VIDEO VISIT  Date: 11/07/2023  Medication: Vitamin D , Ergocalciferol , (DRISDOL ) 1.25 MG (50000 UNIT) CAPS capsule, levocetirizine (XYZAL ) 5 MG tablet [  Has the patient contacted their pharmacy? {yes/no:20286} (Agent: If no, request that the patient contact the pharmacy for the refill. If patient does not wish to contact the pharmacy document the reason why and proceed with request.) (Agent: If yes, when and what did the pharmacy advise?)  Is this the correct pharmacy for this prescription? {yes/no:20286} If no, delete pharmacy and type the correct one.  This is the patient's preferred pharmacy:  [facility] [address], [place] - [phone] [address] Phone: [phone] Fax: [phone]  [id number] - [place], [place] - [address] AT [facility] [address] [place] [address] Phone: [phone] Fax: [phone]   Has the prescription been filled recently? {yes/no:20286}  Is the patient out of the medication? {yes/no:20286}  Has the patient been seen for an appointment in the last year OR does the patient have an upcoming appointment? {yes/no:20286}  Can we respond through MyChart? {yes/no:20286}  Agent: Please be advised that Rx refills may take up to 3 business days. We ask that you follow-up with your pharmacy.

## 2023-12-14 NOTE — Telephone Encounter (Signed)
 Copied from CRM [id number]. Topic: Clinical - Medication Refill >> Dec 14, 2023  1:41 PM [name] wrote: Most Recent Primary Care Visit:  Provider: Sylvia Everts  Department: [facility]  Visit Type: MYCHART VIDEO VISIT  Date: 11/07/2023  Medication: methimazole  (TAPAZOLE ) 5 MG tablet , levocetirizine (XYZAL ) 5 MG tablet  Has the patient contacted their pharmacy? Yes (Agent: If no, request that the patient contact the pharmacy for the refill. If patient does not wish to contact the pharmacy document the reason why and proceed with request.) (Agent: If yes, when and what did the pharmacy advise?)  Is this the correct pharmacy for this prescription? Yes If no, delete pharmacy and type the correct one.  This is the patient's preferred pharmacy:  [facility] [address], [place] - [phone] [address] [place] [address] Phone: [phone] Fax: [phone]    Has the prescription been filled recently? Yes  Is the patient out of the medication? Yes  Has the patient been seen for an appointment in the last year OR does the patient have an upcoming appointment? Yes  Can we respond through MyChart? No  Agent: Please be advised that Rx refills may take up to 3 business days. We ask that you follow-up with your pharmacy.

## 2023-12-14 NOTE — Telephone Encounter (Signed)
 Copied from CRM [id number]. Topic: Clinical - Medication Refill >> Dec 14, 2023  1:46 PM [name] wrote: Most Recent Primary Care Visit:  Provider: Sylvia Everts  Department: [facility]  Visit Type: MYCHART VIDEO VISIT  Date: 11/07/2023  Medication: famciclovir  (FAMVIR ) 250 MG tablet   Has the patient contacted their pharmacy? Yes (Agent: If no, request that the patient contact the pharmacy for the refill. If patient does not wish to contact the pharmacy document the reason why and proceed with request.) (Agent: If yes, when and what did the pharmacy advise?)  Is this the correct pharmacy for this prescription? Yes If no, delete pharmacy and type the correct one.  This is the patient's preferred pharmacy:  [facility] [address], [place] - [address] [phone] [address] [place] [address] Phone: [phone] Fax: [phone]     Has the prescription been filled recently? Yes  Is the patient out of the medication? Yes  Has the patient been seen for an appointment in the last year OR does the patient have an upcoming appointment? Yes  Can we respond through MyChart? No  Agent: Please be advised that Rx refills may take up to 3 business days. We ask that you follow-up with your pharmacy.

## 2023-12-15 ENCOUNTER — Encounter: Payer: Self-pay | Admitting: Registered Nurse

## 2023-12-17 MED ORDER — FAMCICLOVIR 250 MG PO TABS: 250.0000 mg | ORAL_TABLET | Freq: Two times a day (BID) | ORAL | 0 refills | Status: DC

## 2023-12-17 MED ORDER — METHIMAZOLE 5 MG PO TABS: 5.0000 mg | ORAL_TABLET | Freq: Every day | ORAL | 1 refills | Status: DC

## 2023-12-17 MED ORDER — VITAMIN D (ERGOCALCIFEROL) 1.25 MG (50000 UNIT) PO CAPS: 50000.0000 [IU] | ORAL_CAPSULE | ORAL | 0 refills | Status: DC

## 2023-12-19 NOTE — Telephone Encounter (Signed)
 Yes. She needs an MD to be assigned. Irby Mannan can determine that at her visit with the patient

## 2023-12-29 ENCOUNTER — Other Ambulatory Visit: Payer: Self-pay | Admitting: Medical

## 2024-01-02 ENCOUNTER — Encounter: Payer: Self-pay | Admitting: Medical

## 2024-01-02 ENCOUNTER — Ambulatory Visit (HOSPITAL_BASED_OUTPATIENT_CLINIC_OR_DEPARTMENT_OTHER)
Admission: RE | Admit: 2024-01-02 | Discharge: 2024-01-02 | Disposition: A | Discharge status: Home / Self Care | Payer: 59 | Source: Ambulatory Visit | Attending: Medical | Admitting: Medical

## 2024-01-02 ENCOUNTER — Ambulatory Visit (HOSPITAL_BASED_OUTPATIENT_CLINIC_OR_DEPARTMENT_OTHER): Payer: 59

## 2024-01-02 ENCOUNTER — Ambulatory Visit (INDEPENDENT_AMBULATORY_CARE_PROVIDER_SITE_OTHER): Payer: 59 | Admitting: Medical

## 2024-01-02 ENCOUNTER — Other Ambulatory Visit: Payer: Self-pay | Admitting: Medical

## 2024-01-02 VITALS — BP 146/88 | HR 76 | Resp 18 | Ht 63.0 in | Wt 180.4 lb

## 2024-01-02 DIAGNOSIS — R630 Anorexia: Secondary | ICD-10-CM

## 2024-01-02 DIAGNOSIS — R638 Other symptoms and signs concerning food and fluid intake: Secondary | ICD-10-CM

## 2024-01-02 DIAGNOSIS — M79605 Pain in left leg: Secondary | ICD-10-CM | POA: Diagnosis not present

## 2024-01-02 DIAGNOSIS — R1310 Dysphagia, unspecified: Secondary | ICD-10-CM

## 2024-01-02 DIAGNOSIS — R269 Unspecified abnormalities of gait and mobility: Secondary | ICD-10-CM | POA: Diagnosis not present

## 2024-01-02 DIAGNOSIS — R600 Localized edema: Secondary | ICD-10-CM

## 2024-01-02 DIAGNOSIS — H6123 Impacted cerumen, bilateral: Secondary | ICD-10-CM | POA: Diagnosis not present

## 2024-01-02 DIAGNOSIS — R5383 Other fatigue: Secondary | ICD-10-CM | POA: Diagnosis not present

## 2024-01-02 DIAGNOSIS — M79609 Pain in unspecified limb: Secondary | ICD-10-CM | POA: Diagnosis not present

## 2024-01-02 DIAGNOSIS — J449 Chronic obstructive pulmonary disease, unspecified: Secondary | ICD-10-CM | POA: Diagnosis not present

## 2024-01-02 NOTE — Patient Instructions (Addendum)
Chronic Obstructive Pulmonary Disease (COPD) Increased shortness of breath and decreased lung function (21% compared to 45% previously). Noted increased mucus production and nasal dryness. -Continue current medications inhalers and 02 given advised by pulmonlogist. -Order chest x-ray to assess for fluid accumulation in lungs.(evaluate to see if chf component since reporting pedal edema.  Lower Extremity Edema Bilateral, intermittent swelling of the legs and feet. -Order BNP to assess for heart failure along with cxr. -Order ultrasound of left leg to rule out deep vein thrombosis.  Decreased Appetite Noted decreased food intake and preference for small portions or snacks. -Recommend increasing Boost intake to 5 per day. -Order labs including complete blood count, metabolic panel, and B12 to assess for potential causes of decreased appetite.  Hypertension Recent blood pressure readings have been borderline high. -Advise patient to check blood pressure daily for the next five days and report readings via MyChart. -Consider starting low to moderate dose antihypertensive medication based on reported readings.  Home Care Patient reports increased weakness and difficulty with ambulation, particularly at night. -Wrote letter to insurance company to advocate for overnight care.  For fatigue ordere cbc, cmp, b12 and b1.  Follow-up date to be determined after lab review. -Check labs and imaging results. -Continue to monitor blood pressure readings and adjust treatment as necessary. -Continue to monitor patient's appetite and adjust nutritional supplementation as necessary.  Follow up date to be determined after lab review.

## 2024-01-02 NOTE — Progress Notes (Signed)
Subjective:    Patient ID: Jill Spence, female    DOB: 1951/08/27, 73 y.o.   MRN: [id number]  HPI  Pt in for follow up.  She has below chronic conditions.  Below is from sept 2024 visit AVS in "  "Chronic respiratory failure with hypoxia (HCC) Follow up with pulmonlogist. Reviewed recent pulmonary notes. For mucus clearance continue mucinex. - For home use only DME oxygen   Stage 3 severe COPD by GOLD classification (HCC) - For home use only DME oxygen -pt still seeing pulmonlogist   Early this month(seen by Dr. Drue Novel) COPD exacerbation: The patient has increased symptoms for the last few days characterized by increasing sputum production.  O2 sat here is 94%, vital signs stable. Also was exposed with somebody with possible COVID, last contact was 2 days ago. [name]'s COVID test today is negative. Plan: Avoid any contact with a COVID case or suspected COVID case Treat current exacerbation with doxycycline. Take probiotics to prevent diarrhea. Recheck for COVID in 2 to 3 days.  Either home or here, see AVS. Call if not gradually back to normal   Pt did not take doxycycine. States loose stools and upset stomach. But not diarrhea.    Elevated blood pressure reading in past . -bp well controlled today.   Seasonal allergic rhinitis due to pollen with some conjucntivitis Xyzal rx and stop zyrtec. Allergist rx'd bepoatastine and treating eye allergies better than prior drops.   Irritable bowel syndrome with diarrhea In the past had recommended bentyl rather than lopramide. -pt has appointment with GI MD Sep 05, 2023.    Pt followed by endocrinologist for below.   Toxic adenoma with thyrotoxicosis,  Thyroid nodules nand  Vitamin D deficiency"  Discussed the use of AI scribe software for clinical note transcription with the patient, who gave verbal consent to proceed.  History of Present Illness   The patient, with chronic obstructive pulmonary disease, presents with  worsening weakness, swelling, and  reports told decreased lung function by pulmonlogist.  She has experienced a significant decline in lung function, with a recent pulmonology visit indicating a drop to 21% from a previous 45%. This decline has increased her need for overnight care.  Over the past six months, she has noticed increasing weakness, describing herself as 'a lot weaker' and experiencing difficulty with balance, particularly at night. She also mentions frequent choking and nasal irritation from constant 02 use.   She describes swelling in her legs and feet, which alternates between the left and right sides. The swelling can involve the ankles, feet, or extend from the knees down. She notes that the swelling is external and affects both legs, though not simultaneously.  She reports difficulty swallowing, with certain foods causing throat irritation and coughing. She sometimes experiences nasal regurgitation when swallowing. No heartburn, as she is on medication for it.  Her appetite has decreased, often consuming very small portions or skipping meals entirely. She sometimes relies on snacks like chips and soda. She has been provided with nutritional supplements like Boost, but finds Ensure Clear unpalatable. She is currently limited to one or two Boosts a day, but feels she needs more due to her decreased appetite.  She does not regularly monitor her blood pressure at home, though she has a history of borderline readings. She mentions 'white coat syndrome' as a possible factor in elevated readings during medical visits.        Review of Systems See hpi    Objective:  Physical Exam  General Mental Status- Alert. General Appearance- Not in acute distress.   Skin General: Color- Normal Color. Moisture- Normal Moisture.  Neck Carotid Arteries- Normal color. Moisture- Normal Moisture. No carotid bruits. No JVD.  Chest and Lung Exam Auscultation: Breath Sounds:-shallow even and  unlabored.  Cardiovascular Auscultation:Rythm- RRR Murmurs & Other Heart Sounds:Auscultation of the heart reveals- No Murmurs.  Abdomen Inspection:-Inspeection Normal. Palpation/Percussion:Note:No mass. Palpation and Percussion of the abdomen reveal- Non Tender, Non Distended + BS, no rebound or guarding.    Neurologic Cranial Nerve exam:- CN III-XII intact(No nystagmus), symmetric smile. Strength:- 5/5 equal and symmetric strength both upper and lower extremities.   Left lower ext- mild swollen compared to rt side. Mild + homans signs. Rt lower ext- no obvious swelling and negative homans sign.    Assessment & Plan:   Patient Instructions  Chronic Obstructive Pulmonary Disease (COPD) Increased shortness of breath and decreased lung function (21% compared to 45% previously). Noted increased mucus production and nasal dryness. -Continue current medications inhalers and 02 given advised by pulmonlogist. -Order chest x-ray to assess for fluid accumulation in lungs.(evaluate to see if chf component since reporting pedal edema.  Lower Extremity Edema Bilateral, intermittent swelling of the legs and feet. -Order BNP to assess for heart failure along with cxr. -Order ultrasound of left leg to rule out deep vein thrombosis.  Decreased Appetite Noted decreased food intake and preference for small portions or snacks. -Recommend increasing Boost intake to 5 per day. -Order labs including complete blood count, metabolic panel, and B12 to assess for potential causes of decreased appetite.  Hypertension Recent blood pressure readings have been borderline high. -Advise patient to check blood pressure daily for the next five days and report readings via MyChart. -Consider starting low to moderate dose antihypertensive medication based on reported readings.  Home Care Patient reports increased weakness and difficulty with ambulation, particularly at night. -Wrote letter to insurance  company to advocate for overnight care.  For fatigue ordere cbc, cmp, b12 and b1.  Follow-up date to be determined after lab review. -Check labs and imaging results. -Continue to monitor blood pressure readings and adjust treatment as necessary. -Continue to monitor patient's appetite and adjust nutritional supplementation as necessary.  Follow up date to be determined after lab review.   Esperanza Richters, PA-C    Time spent with patient today was 45  minutes which consisted of chart review, discussing diagnosis, work up treatment and documentation.   Additional 5-10 minutes on top of 45 minutes attempted to coordinate [place] scheduling.

## 2024-01-03 ENCOUNTER — Other Ambulatory Visit: Payer: Self-pay | Admitting: Pulmonary Disease

## 2024-01-03 ENCOUNTER — Encounter: Payer: Self-pay | Admitting: Medical

## 2024-01-03 ENCOUNTER — Ambulatory Visit (HOSPITAL_BASED_OUTPATIENT_CLINIC_OR_DEPARTMENT_OTHER): Payer: 59

## 2024-01-03 DIAGNOSIS — J449 Chronic obstructive pulmonary disease, unspecified: Secondary | ICD-10-CM | POA: Diagnosis not present

## 2024-01-03 LAB — COMPREHENSIVE METABOLIC PANEL
ALT: 11 U/L (ref 0–35)
AST: 17 U/L (ref 0–37)
Albumin: 4 g/dL (ref 3.5–5.2)
Alkaline Phosphatase: 66 U/L (ref 39–117)
BUN: 13 mg/dL (ref 6–23)
CO2: 31 meq/L (ref 19–32)
Calcium: 8.9 mg/dL (ref 8.4–10.5)
Chloride: 104 meq/L (ref 96–112)
Creatinine, Ser: 0.6 mg/dL (ref 0.40–1.20)
GFR: 89.8 mL/min (ref >60.00)
Glucose, Bld: 85 mg/dL (ref 70–99)
Potassium: 3.7 meq/L (ref 3.5–5.1)
Sodium: 143 meq/L (ref 135–145)
Total Bilirubin: 0.5 mg/dL (ref 0.2–1.2)
Total Protein: 6.6 g/dL (ref 6.0–8.3)

## 2024-01-03 LAB — CBC WITH DIFFERENTIAL/PLATELET
Basophils Absolute: 0.1 10*3/uL (ref 0.0–0.1)
Basophils Relative: 0.8 % (ref 0.0–3.0)
Eosinophils Absolute: 0.1 10*3/uL (ref 0.0–0.7)
Eosinophils Relative: 1.5 % (ref 0.0–5.0)
HCT: 40 % (ref 36.0–46.0)
Hemoglobin: 13.2 g/dL (ref 12.0–15.0)
Lymphocytes Relative: 30.2 % (ref 12.0–46.0)
Lymphs Abs: 2.5 10*3/uL (ref 0.7–4.0)
MCHC: 33.1 g/dL (ref 30.0–36.0)
MCV: 97.1 fL (ref 78.0–100.0)
Monocytes Absolute: 0.6 10*3/uL (ref 0.1–1.0)
Monocytes Relative: 7.8 % (ref 3.0–12.0)
Neutro Abs: 4.9 10*3/uL (ref 1.4–7.7)
Neutrophils Relative %: 59.7 % (ref 43.0–77.0)
Platelets: 300 10*3/uL (ref 150.0–400.0)
RBC: 4.12 Mil/uL (ref 3.87–5.11)
RDW: 14.9 % (ref 11.5–15.5)
WBC: 8.2 10*3/uL (ref 4.0–10.5)

## 2024-01-03 LAB — BRAIN NATRIURETIC PEPTIDE: Pro B Natriuretic peptide (BNP): 124 pg/mL — ABNORMAL HIGH (ref 0.0–100.0)

## 2024-01-03 LAB — VITAMIN B12: Vitamin B-12: 410 pg/mL (ref 211–911)

## 2024-01-04 ENCOUNTER — Ambulatory Visit: Payer: Self-pay | Admitting: Licensed Clinical Social Worker

## 2024-01-04 ENCOUNTER — Telehealth: Payer: Self-pay

## 2024-01-04 NOTE — Patient Instructions (Signed)
Visit Information  Thank you for taking time to visit with me today. Please don't hesitate to contact me if I can be of assistance to you.   Following are the goals we discussed today:   Goals Addressed             This Visit's Progress    LCSW-Management of MH/Stress   On track    Activities and task to complete in order to accomplish goals.   Keep all upcoming appointments discussed today Continue with compliance of taking medication prescribed by Doctor Implement healthy coping skills discussed to assist with management of symptoms Follow up with Department of Social Services  Follow up with [name] about additional benefits. Reference booklet for eligibility criteria Submit letters from specialists to [name] to inquire about additional home care hours         Our next appointment is by telephone on 3/7 at 1 PM  Please call the care guide team at [phone] if you need to cancel or reschedule your appointment.   If you are experiencing a Mental Health or Behavioral Health Crisis or need someone to talk to, please call the Suicide and Crisis Lifeline: 988 call 911   Patient verbalizes understanding of instructions and care plan provided today and agrees to view in MyChart. Active MyChart status and patient understanding of how to access instructions and care plan via MyChart confirmed with patient.     Windy Fast [facility]  [facility], [facility] Clinical Social Worker Direct Dial: [phone]  Fax: [phone] Website: [name] 1:31 PM

## 2024-01-04 NOTE — Patient Outreach (Signed)
  Care Coordination   Follow Up Visit Note   01/04/2024 Name: Jill Spence MRN: [id number] DOB: 21-Sep-1951  Jill Spence is a 73 y.o. year old female who sees [place], Jill Spence, [place] for primary care. I spoke with  Jill Spence by phone today.  What matters to the patients health and wellness today?  Symptom Management    Goals Addressed             This Visit's Progress    LCSW-Management of MH/Stress   On track    Activities and task to complete in order to accomplish goals.   Keep all upcoming appointments discussed today Continue with compliance of taking medication prescribed by Doctor Implement healthy coping skills discussed to assist with management of symptoms Follow up with Department of Social Services  Follow up with [name] about additional benefits. Reference booklet for eligibility criteria Submit letters from specialists to [name] to inquire about additional home care hours         SDOH assessments and interventions completed:  No     Care Coordination Interventions:  Yes, provided  Interventions Today    Flowsheet Row Most Recent Value  Chronic Disease   Chronic disease during today's visit Chronic Obstructive Pulmonary Disease (COPD), Other  [Depression and Anxiety]  General Interventions   General Interventions Discussed/Reviewed General Interventions Reviewed, [facility], Doctor Visits, Level of Care  Doctor Visits Discussed/Reviewed Doctor Visits Reviewed  Level of Care Personal Care Services  Mental Health Interventions   Mental Health Discussed/Reviewed Mental Health Reviewed, Coping Strategies, Anxiety, Depression  [Identified stress due to management of chronic health condition and unstability of benefits due to politics]  Nutrition Interventions   Nutrition Discussed/Reviewed Nutrition Reviewed  Pharmacy Interventions   Pharmacy Dicussed/Reviewed Pharmacy Topics Reviewed, Medication Adherence  Safety  Interventions   Safety Discussed/Reviewed Safety Reviewed       Follow up plan: Follow up call scheduled for 4-6 weeks    Encounter Outcome:  Patient Visit Completed   Jill Lucks, LCSW [place]  [facility], [facility] Clinical Social Worker Direct Dial: [phone]  Fax: [phone] Website: [name] 1:29 PM

## 2024-01-04 NOTE — Telephone Encounter (Signed)
 Pt called and lvm to return call

## 2024-01-04 NOTE — Telephone Encounter (Signed)
-----   Message from Esperanza Richters sent at 01/04/2024  3:24 PM EST ----- Will you call pt on lab results. See below request. You can also give her follow up in 2-3 weeks. Please make sure she has a 40 minute slot. ----- Message ----- From: Deirdre Peer Sent: 01/04/2024   1:38 PM EST To: Esperanza Richters, PA-C  Hi Mr. Alvira Monday! Patient is requesting a nurse to call her to discuss recent labwork. She would also like to schedule a f/up appt with you. Thanks!

## 2024-01-07 DIAGNOSIS — K219 Gastro-esophageal reflux disease without esophagitis: Secondary | ICD-10-CM | POA: Diagnosis not present

## 2024-01-07 DIAGNOSIS — J449 Chronic obstructive pulmonary disease, unspecified: Secondary | ICD-10-CM | POA: Diagnosis not present

## 2024-01-07 DIAGNOSIS — G610 Guillain-Barre syndrome: Secondary | ICD-10-CM | POA: Diagnosis not present

## 2024-01-07 LAB — VITAMIN B1: Vitamin B1 (Thiamine): 21 nmol/L (ref 8–30)

## 2024-01-09 ENCOUNTER — Ambulatory Visit: Payer: Self-pay | Admitting: Medical

## 2024-01-09 DIAGNOSIS — J449 Chronic obstructive pulmonary disease, unspecified: Secondary | ICD-10-CM | POA: Diagnosis not present

## 2024-01-09 NOTE — Telephone Encounter (Signed)
 Copied from CRM [id number]. Topic: Clinical - Red Word Triage >> Jan 09, 2024 12:34 PM Margarette Shawl wrote: Red Word that prompted transfer to Nurse Triage: Patient's CNA Starleen Eastern called in to report elevated blood pressures. Was seen last week by provider who advised to contact clinic with her blood pressure readings.   Still has swellings of both, more so on left side.  Shortness of breath, wears oxygen continuously for entire day. Typically only wears it as needed.  Blood pressure readings do not improve with oxygen on.   01/31- 141/92 02/01-141/88 02/02-142/92 02/03-156/98  Today was 154/96   Chief Complaint: Elevated blood pressure Symptoms: High blood pressure, intermittent headache, slightly worse shortness of breath, some left leg swelling  Frequency: Intermittent  Disposition: [] ED /[] Urgent Care (no appt availability in office) / [x] Appointment(In office/virtual)/ []  [facility]/ [] Home Care/ [] Refused Recommended Disposition /[] [facility]/ []  Follow-up with PCP Additional Notes: Patient's personal nurse Camisha called with the patient stating that the patient's blood pressure's have been elevated for approximately 1 week. She states the blood pressures have been up to 150's/90's. She states that the patient has also had some intermittent headaches and left leg swelling. She states the patient has also had to wear her oxygen more than normal. Appointment made for the patient on Monday and placed on wait list. I advised to call back for new or worsening symptoms and what symptoms would require emergency care. She verbalized understanding and agreement of this plan.     Reason for Disposition  [1] Systolic BP  >= 130 OR Diastolic >= 80 AND [2] not taking BP medications  Answer Assessment - Initial Assessment Questions 1. BLOOD PRESSURE: "What is the blood pressure?" "Did you take at least two measurements 5 minutes apart?"     154/96 2. ONSET: "When did  you take your blood pressure?"     Today, been a known problem since last week 3. HOW: "How did you take your blood pressure?" (e.g., automatic home BP monitor, visiting nurse)     Visiting nurse 4. HISTORY: "Do you have a history of high blood pressure?"     No 5. MEDICINES: "Are you taking any medicines for blood pressure?" "Have you missed any doses recently?"     N/A 6. OTHER SYMPTOMS: "Do you have any symptoms?" (e.g., blurred vision, chest pain, difficulty breathing, headache, weakness)     Increasing shortness of breath, left leg swelling  Protocols used: Blood Pressure - High-A-AH

## 2024-01-14 ENCOUNTER — Ambulatory Visit (INDEPENDENT_AMBULATORY_CARE_PROVIDER_SITE_OTHER): Payer: 59 | Admitting: Medical

## 2024-01-14 VITALS — BP 148/85 | HR 65 | Temp 98.5°F | Resp 16 | Ht 63.0 in | Wt 172.0 lb

## 2024-01-14 DIAGNOSIS — I10 Essential (primary) hypertension: Secondary | ICD-10-CM

## 2024-01-14 DIAGNOSIS — R600 Localized edema: Secondary | ICD-10-CM

## 2024-01-14 MED ORDER — LOSARTAN POTASSIUM 25 MG PO TABS: 25.0000 mg | ORAL_TABLET | Freq: Every day | ORAL | 0 refills | Status: DC

## 2024-01-14 NOTE — Patient Instructions (Addendum)
 Hypertension Recent elevated blood pressure readings. Start Losartan  25mg  daily. -Losartan  25mg  daily. -Check blood pressure daily and report readings in 1 week. -Consider increasing Losartan  dose if blood pressure remains above 140/90.  Dependent Edema Bilateral lower extremity swelling, worse on the left, likely due to gravity/dependant edema. Recent ultrasound negative for DVT. cxr showed no chf. -Elevate legs as much as possible when not standing. -Notify provider if there is significant increase in swelling or pain behind either knee. -in that event repeat US . not indicated today.  Home Care She requires additional overnight care due to multiple comorbidities and difficulty ambulating. -Resend previous letter advocating for overnight care and detailed medical note to insurance company. -Follow up with insurance company to clarify its requirements for approval of additional care.  Esophageal Stenosis History of esophageal dilation. Currently able to swallow regular meals without choking. -If difficulty swallowing liquids or solids develops, refer to GI for evaluation.  Ear Cleaning(prior attempt failed She has history of ear clogging.(Recommend debrox otc for 4 days prior to attempted lavage. -Consider referral for ear cleaning after winter season to avoid drafts.(Pt declined referral presently)  General Health Maintenance -Continue current medications and treatments as prescribed. -Follow up to be determined based on blood pressure readings and response from insurance company regarding additional home care.

## 2024-01-14 NOTE — Progress Notes (Signed)
 Subjective:    Patient ID: Jill Spence, female    DOB: 1951/11/12, 73 y.o.   MRN: [id number]  HPI Discussed the use of AI scribe software for clinical note transcription with the patient, who gave verbal consent to proceed.  History of Present Illness   Jill Spence is a 73 year old female with COPD, edema, decreased appetite, and hypertension who presents for evaluation and documentation for insurance purposes.  She has been experiencing difficulty with ambulation, particularly at night, and requires assistance with daily activities such as going to the bathroom. She uses a walker and oxygen at home, and has installed rails in her bathroom to aid mobility. The space in her room makes it challenging to maneuver the walker, and there is a risk of tripping over the oxygen cord.  She has a history of COPD, which contributes to her overall weakness and difficulty with ambulation. She uses oxygen at home to manage her symptoms.  She experiences edema, particularly in her left leg, which has been swelling. An ultrasound performed ten days ago showed no deep vein thrombosis (DVT). The swelling is likely dependent edema, which improves with leg elevation.  She has a history of hypertension, with recent blood pressure readings fluctuating between 131/92 and 156/98.(majority on 150/90 range. She is not currently on any blood pressure medication, but recalls a previous episode where she was prescribed medication temporarily to manage high blood pressure. She has been reducing salt intake, despite cravings for salty foods like potato chips.   She mentions a past procedure involving esophageal dilation due to difficulty swallowing, but currently denies any issues with swallowing regular foods or liquids.         Review of Systems See hpi.    Objective:   Physical Exam  General Mental Status- Alert. General Appearance- Not in acute distress.   Skin General: Color- Normal Color. Moisture-  Normal Moisture.  Neck Carotid Arteries- Normal color. Moisture- Normal Moisture. No carotid bruits. No JVD.  Chest and Lung Exam Auscultation: Breath Sounds:-CTA  Cardiovascular Auscultation:Rythm- RRR Murmurs & Other Heart Sounds:Auscultation of the heart reveals- No Murmurs.  Abdomen Inspection:-Inspeection Normal. Palpation/Percussion:Note:No mass. Palpation and Percussion of the abdomen reveal- Non Tender, Non Distended + BS, no rebound or guarding.   Neurologic Cranial Nerve exam:- CN III-XII intact(No nystagmus), symmetric smile. Strength:- 5/5 equal and symmetric strength both upper and lower extremities.   Lower ext- left calf minimally swollen at best compared to rt side. Negative homans signs. No obvious pedal edema.    Assessment & Plan:   Patient Instructions  Hypertension Recent elevated blood pressure readings. Start Losartan  25mg  daily. -Losartan  25mg  daily. -Check blood pressure daily and report readings in 1 week. -Consider increasing Losartan  dose if blood pressure remains above 140/90.  Dependent Edema Bilateral lower extremity swelling, worse on the left, likely due to gravity/dependant edema. Recent ultrasound negative for DVT. cxr showed no chf. -Elevate legs as much as possible when not standing. -Notify provider if there is significant increase in swelling or pain behind either knee. -in that event repeat US . not indicated today.  Home Care She requires additional overnight care due to multiple comorbidities and difficulty ambulating. -Resend previous letter advocating for overnight care and detailed medical note to insurance company. -Follow up with insurance company to clarify its requirements for approval of additional care.  Esophageal Stenosis History of esophageal dilation. Currently able to swallow regular meals without choking. -If difficulty swallowing liquids or solids develops, refer to  GI for evaluation.  Ear Cleaning She has  history of ear clogging. -Consider referral for ear cleaning after winter season to avoid drafts.  General Health Maintenance -Continue current medications and treatments as prescribed. -Follow up to be determined based on blood pressure readings and response from insurance company regarding additional home care.     Jill [place]field, PA-C

## 2024-01-17 ENCOUNTER — Ambulatory Visit (HOSPITAL_BASED_OUTPATIENT_CLINIC_OR_DEPARTMENT_OTHER): Payer: 59

## 2024-01-17 ENCOUNTER — Ambulatory Visit (HOSPITAL_BASED_OUTPATIENT_CLINIC_OR_DEPARTMENT_OTHER)
Admission: RE | Admit: 2024-01-17 | Discharge: 2024-01-17 | Disposition: A | Discharge status: Home / Self Care | Payer: 59 | Source: Ambulatory Visit | Attending: Pulmonary Disease | Admitting: Pulmonary Disease

## 2024-01-17 DIAGNOSIS — R911 Solitary pulmonary nodule: Secondary | ICD-10-CM | POA: Diagnosis not present

## 2024-01-17 DIAGNOSIS — E041 Nontoxic single thyroid nodule: Secondary | ICD-10-CM | POA: Diagnosis not present

## 2024-01-17 DIAGNOSIS — R918 Other nonspecific abnormal finding of lung field: Secondary | ICD-10-CM | POA: Diagnosis not present

## 2024-01-17 DIAGNOSIS — J439 Emphysema, unspecified: Secondary | ICD-10-CM | POA: Diagnosis not present

## 2024-01-17 DIAGNOSIS — I700 Atherosclerosis of aorta: Secondary | ICD-10-CM | POA: Diagnosis not present

## 2024-01-22 ENCOUNTER — Encounter: Payer: Self-pay | Admitting: Internal Medicine

## 2024-01-22 ENCOUNTER — Ambulatory Visit (INDEPENDENT_AMBULATORY_CARE_PROVIDER_SITE_OTHER): Payer: 59 | Admitting: Internal Medicine

## 2024-01-22 VITALS — BP 130/80 | HR 72 | Ht 63.0 in | Wt 175.6 lb

## 2024-01-22 DIAGNOSIS — E0590 Thyrotoxicosis, unspecified without thyrotoxic crisis or storm: Secondary | ICD-10-CM | POA: Diagnosis not present

## 2024-01-22 DIAGNOSIS — E042 Nontoxic multinodular goiter: Secondary | ICD-10-CM | POA: Diagnosis not present

## 2024-01-22 NOTE — Progress Notes (Signed)
 Patient ID: Jill Spence, female   DOB: 19-Aug-1951, 74 y.o.   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  She previously saw Dr. Everardo All, but last visit with me 6 months ago.   She is here with her mental health caregiver.  Interim history: She still has intermittent problems swallowing especially when she lays on her back watching TV, and also feels mucus in her neck. She has fatigue and hair loss - increased.  She tells me her breathing much worse. 3L per min. No steroids recently.  Thyrotoxicosis: Patient was found to have abnormal thyroid tests at least in 2018 -per my review of the records.  She is currently on methimazole 5 mg daily.  I reviewed pt's thyroid tests: Lab Results  Component Value Date   TSH 0.39 07/19/2023   TSH 0.52 03/05/2023   TSH 0.26 (L) 01/11/2023   TSH 0.69 07/11/2022   TSH 0.59 03/23/2022   TSH 0.81 12/22/2021   TSH 0.32 (L) 09/21/2021   TSH 0.70 07/26/2021   TSH 1.91 04/26/2021   TSH <0.01 (L) 03/15/2021   FREET4 0.70 07/19/2023   FREET4 0.66 03/05/2023   FREET4 0.65 01/11/2023   FREET4 0.62 07/11/2022   FREET4 0.65 03/23/2022   FREET4 0.56 (L) 12/22/2021   FREET4 0.51 (L) 09/21/2021   FREET4 0.57 (L) 07/26/2021   FREET4 0.37 (L) 04/26/2021   FREET4 0.51 (L) 03/15/2021   T3FREE 2.9 07/19/2023   T3FREE 2.9 03/05/2023   T3FREE 2.6 01/11/2023   T3FREE 2.4 07/11/2022   Lab Results  Component Value Date   T3FREE 2.9 07/19/2023   T3FREE 2.9 03/05/2023   T3FREE 2.6 01/11/2023   T3FREE 2.4 07/11/2022     Ref Range & Units   Thyroid Stimulating Hormone (TSH) 0.34 - 5.66 IU/mL 0.04 Low    Thyroxine, Free (FT4) 0.52 - 1.21 ng/dL 4.09   Resulting Agency  [facility]   Specimen Collected: 09/24/17 11:35 Last Resulted: 09/24/17 13:59  Received From: [facility]  Result Received: 03/15/21  13:35   Antithyroid antibodies: No results found for: "TSI"  Multiple thyroid nodules:  Thyroid U/S (10/26/2020): Parenchymal Echotexture: Moderately heterogenous  Isthmus: 0.3 cm  Right lobe: 4.6 x 1.5 x 1.6 cm  Left lobe: 4.4 x 2.3 x 2.4 cm  _________________________________________________________   Estimated total number of nodules >/= 1 cm: 1 _________________________________________________________   Nodule # 1:  Location: Right; Inferior  Maximum size: 0.9 cm; Other 2 dimensions: 0.6 x 0.5 cm  Composition: solid/almost completely solid (2)  Echogenicity: hypoechoic (2) Given size (<0.9 cm) and appearance, this nodule does NOT meet TI-RADS criteria for biopsy or dedicated follow-up.  _________________________________________________________   Nodule # 2:  Location: Right; Mid  Maximum size: 0.8 cm; Other 2 dimensions: 0.5 x 0 5 cm  Composition: spongiform (0)  Echogenicity: isoechoic (1)  This nodule does NOT meet TI-RADS criteria for biopsy or dedicated follow-up.  _________________________________________________________   Nodule # 3:  Location: Right; Superior medial  Maximum size: 0.6 cm; Other 2 dimensions: 0.5 x 0.5 cm  Composition: solid/almost completely solid (2)  Echogenicity: hypoechoic (2) Given size (<0.9 cm) and appearance, this nodule does NOT meet TI-RADS criteria for biopsy or dedicated follow-up.  _________________________________________________________   Nodule # 4:  Location: Right; Superior  Maximum size: 0.8 cm; Other 2 dimensions: 0.5 x 0.7 cm  Composition: mixed cystic  and solid (1)  Echogenicity: hypoechoic (2)  Given size (<1.4 cm) and appearance, this nodule does NOT meet TI-RADS criteria for biopsy or dedicated follow-up.  _________________________________________________________   Nodule # 5:  Location: Left; Inferior  Maximum size: 2.9 cm; Other 2 dimensions: 2.3 x 2.2 cm  Composition: mixed cystic and solid (1)   Echogenicity: hypoechoic (2) Margins: lobulated/irregular (2)  Echogenic foci: macrocalcifications (1)  ACR TI-RADS total points: 6. ACR TI-RADS risk category: TR4 (4-6 points)  **Given size (>/= 1.5 cm) and appearance, fine needle aspiration of this moderately suspicious nodule should be considered based on TI-RADS criteria.  _________________________________________________________   Nodule # 6:  Location: Left; Superior  Maximum size: 0.7 cm; Other 2 dimensions: 0.5 x 0.7 cm  Composition: solid/almost completely solid (2)  Echogenicity: hypoechoic (2) Given size (<0.9 cm) and appearance, this nodule does NOT meet TI-RADS criteria for biopsy or dedicated follow-up.  ________________________________________________________   IMPRESSION: 1. Multinodular goiter. 2. Dominant nodule in the left inferior thyroid measuring up to 2.9 cm (labeled 5) which meets criteria (TI-RADS category 4) for tissue sampling. Recommend ultrasound-guided fine-needle aspiration. 3. Additional scattered thyroid nodules appear benign. These nodules require additional dedicated follow-up.    Thyroid uptake and scan (01/13/2021): Dominant hot nodule in the left thyroid lobe with suppression of the  rest of the gland. Findings suggest a hyperfunctioning thyroid  nodule.  4 hour I-131 uptake = 10.1% (normal 5-20%)   24 hour I-131 uptake = 22.4% (normal 10-30%)   IMPRESSION:  Findings suggest a hyperfunctioning nodule in the left thyroid lobe  suppressing the rest of the gland.   PET scan (05/01/2021): The thyroid nodules were not FDG avid  Thyroid U/S (07/18/2022): Parenchymal Echotexture: Moderately heterogenous  Isthmus: 0.3 cm  Right lobe: 4.1 cm x 1.4 cm x 1.9 cm  Left lobe: 4.6 cm x 2.5 cm x 2.4 cm  ____________________________________________________   Nodule labeled 1, superior right thyroid, 6 mm. Nodule has cystic/spongiform characteristics and does not meet criteria  for surveillance.   Nodule labeled 2, superior right thyroid, 8 mm. Nodule has cystic/spongiform characteristics and does not meet criteria for surveillance.   Nodule labeled 3, mid right thyroid, 8 mm. Nodule has spongiform/cystic characteristics and does not meet criteria for surveillance.   Nodule labeled 4, inferior right thyroid, 7 mm, decreased in size, TR 4 characteristics. Nodule does not meet criteria for surveillance.   Nodule labeled 5, mid left thyroid, unchanged 7 mm. Nodule has TR 4 characteristics and does not meet criteria for surveillance.   Nodule labeled 6, inferior left thyroid, 3.1 cm. Nodule has TR 4 characteristics and meets criteria for biopsy.   No adenopathy   IMPRESSION: Multinodular thyroid again demonstrated.   Left inferior thyroid nodule again meets criteria for biopsy, as designated by the newly established ACR TI-RADS criteria, and referral for biopsy is recommended.    FNA (09/13/2022): Atypia of undetermined significance (Bethesda category III)  Afirma:  benign  Mentions: - + hoarseness - + dysphagia - + choking - + SOB with lying down These improved in the last year, but still having them intermittently  No: - tremors - anxiety - palpitations - hyperdefecation - weight loss  Pt does have a FH of thyroid ds.: nieces. No FH of thyroid cancer. No h/o radiation tx to head or neck. + steroid use frequently - not recently. No herbal supplements.  Not on a B complex.  She also has a history of vitamin D deficiency, IBS -diarrhea.  She has Guillain-Barr syndrome - with exacerbations.  ROS: + see HPI  Past Medical History:  Diagnosis Date   Anemia    low iron   Anxiety    Arthritis    Bipolar disorder (HCC)    Cancer (HCC) 1973   COPD (chronic obstructive pulmonary disease) (HCC)    Family history of adverse reaction to anesthesia    mom "was put to sleep and she never woke up" -    GERD (gastroesophageal reflux disease)     Guillain Barr syndrome (HCC)    numbness in toes, legs hurt   Headache    migraines as a teenager   IBS (irritable bowel syndrome)    Lump, breast    removed years ago per Pt.   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, has "quiet' seizures   Thyroid disease    Past Surgical History:  Procedure Laterality Date   BREAST SURGERY     COLONOSCOPY     EYE SURGERY Bilateral    cataract surgery with lens implants   MANDIBLE RECONSTRUCTION     MINOR REMOVAL OF MANDIBULAR HARDWARE Right 10/15/2015   Procedure: MINOR REMOVAL OF Right MANDIBULAR HARDWARE;  Surgeon: Ocie Doyne, DDS;  Location: [facility] OR;  Service: Oral Surgery;  Laterality: Right;   TONSILLECTOMY     Social History   Socioeconomic History   Marital status: Widowed    Spouse name: Not on file   Number of children: Not on file   Years of education: Not on file   Highest education level: Some college, no degree  Occupational History   Not on file  Tobacco Use   Smoking status: Former    Current packs/day: 0.00    Average packs/day: 0.3 packs/day for 6.0 years (1.5 ttl pk-yrs)    Types: Cigarettes    Start date: 10/13/2001    Quit date: 10/14/2007    Years since quitting: 16.2   Smokeless tobacco: Never  Vaping Use   Vaping status: Never Used  Substance and Sexual Activity   Alcohol use: No   Drug use: No   Sexual activity: Not on file  Other Topics Concern   Not on file  Social History Narrative   Not on file   Social Drivers of Health   Financial Resource Strain: Medium Risk (01/14/2024)   Overall Financial Resource Strain (CARDIA)    Difficulty of Paying Living Expenses: Somewhat hard  Food Insecurity: No Food Insecurity (01/14/2024)   Hunger Vital Sign    Worried About Running Out of Food in the Last Year: Never true    Ran Out of Food in the Last Year: Never true  Transportation Needs: No Transportation Needs (01/14/2024)    PRAPARE - Administrator, Civil Service (Medical): No    Lack of Transportation (Non-Medical): No  Physical Activity: Inactive (01/14/2024)   Exercise Vital Sign    Days of Exercise per Week: 0 days    Minutes of Exercise per Session: 30 min  Stress: Stress Concern Present (01/14/2024)   [employer] of Occupational Health - Occupational Stress Questionnaire    Feeling of Stress : Very much  Social Connections: Moderately Isolated (01/14/2024)   Social Connection and Isolation Panel [NHANES]    Frequency of Communication with Friends and Family: More than three times a week    Frequency of Social Gatherings with Friends and  Family: Once a week    Attends Religious Services: Never    Active Member of Clubs or Organizations: Yes    Attends Banker Meetings: 1 to 4 times per year    Marital Status: Widowed  Intimate Partner Violence: Not on file   Current Outpatient Medications on File Prior to Visit  Medication Sig Dispense Refill   albuterol (PROVENTIL) (2.5 MG/3ML) 0.083% nebulizer solution Take 3 mLs (2.5 mg total) by nebulization every 6 (six) hours as needed for wheezing. 180 mL 2   albuterol (VENTOLIN HFA) 108 (90 Base) MCG/ACT inhaler Inhale 2 puffs into the lungs every 6 (six) hours as needed for wheezing. 18 g 11   alclomethasone (ACLOVATE) 0.05 % cream Apply topically 2 (two) times daily as needed.     alendronate (FOSAMAX) 70 MG tablet Take 70 mg by mouth every Sunday. Take with a full glass of water on an empty stomach.     ARIPiprazole (ABILIFY) 15 MG tablet Take 15 mg by mouth daily.     B Complex Vitamins (VITAMIN B COMPLEX) TABS Take 1 tablet by mouth daily. 30 tablet 5   benzonatate (TESSALON) 100 MG capsule TAKE ONE CAPSULE BY MOUTH THREE TIMES DAILY AS NEEDED FOR COUGH 30 capsule 1   benzonatate (TESSALON) 100 MG capsule Take 1 capsule (100 mg total) by mouth 3 (three) times daily as needed for cough. 30 capsule 0   benztropine (COGENTIN) 0.5  MG tablet Take 0.5 mg by mouth 2 (two) times daily.     Bepotastine Besilate (BEPREVE) 1.5 % SOLN Place 1 drop into both eyes 2 (two) times daily as needed. 10 mL 5   Bepotastine Besilate 1.5 % SOLN Apply to eye.     cromolyn (OPTICROM) 4 % ophthalmic solution 1 drop 4 (four) times daily.     diclofenac Sodium (VOLTAREN ARTHRITIS PAIN) 1 % GEL Apply 4 g topically 4 (four) times daily. 150 g 3   dicyclomine (BENTYL) 10 MG capsule TAKE ONE CAPSULE BY MOUTH THREE TIMES DAILY BEFORE MEALS. 90 capsule 5   dicyclomine (BENTYL) 20 MG tablet Take 20 mg by mouth 3 (three) times daily as needed.     doxycycline (ADOXA) 100 MG tablet Take 1 tablet by mouth daily.     doxycycline (VIBRA-TABS) 100 MG tablet Take 1 tablet (100 mg total) by mouth 2 (two) times daily. 15 tablet 0   erythromycin ophthalmic ointment Apply to eye.     famciclovir (FAMVIR) 250 MG tablet Take 1 tablet (250 mg total) by mouth 2 (two) times daily. 60 tablet 0   Fluticasone-Umeclidin-Vilant (TRELEGY ELLIPTA) 200-62.5-25 MCG/ACT AEPB Inhale 1 puff into the lungs daily. 3 each 3   gabapentin (NEURONTIN) 400 MG capsule Take 3 capsules (1,200 mg total) by mouth 3 (three) times daily. 270 capsule 2   hydrOXYzine (ATARAX) 10 MG tablet TAKE 1-2 TABLETS (10-20 MG TOTAL) BY MOUTH AT BEDTIME AS NEEDED FOR ANXIETY (INSOMNIA). 180 tablet 0   ibuprofen (ADVIL) 800 MG tablet TAKE ONE TABLET BY MOUTH EVERY 8 HOURS AS NEEDED FOR MODERATE PAIN 30 tablet 1   ipratropium-albuterol (DUONEB) 0.5-2.5 (3) MG/3ML SOLN Take 3 mLs by nebulization every 4 (four) hours as needed. 360 mL 5   levocetirizine (XYZAL) 5 MG tablet TAKE ONE TABLET BY MOUTH EVERY EVENING 30 tablet 3   loperamide (IMODIUM) 2 MG capsule TAKE ONE CAPSULE BY MOUTH EVERY 6 TO 8 HOURS AS NEEDED FOR DIARRHEA 30 capsule 5   losartan (COZAAR) 25 MG  tablet Take 1 tablet (25 mg total) by mouth daily. 30 tablet 0   magnesium oxide (MAG-OX) 400 MG tablet Take 250 mg by mouth daily.     methimazole  (TAPAZOLE) 5 MG tablet Take 1 tablet (5 mg total) by mouth daily. 90 tablet 1   montelukast (SINGULAIR) 10 MG tablet Take 1 tablet (10 mg total) by mouth at bedtime. 90 tablet 3   NONFORMULARY OR COMPOUNDED ITEM Elevated toilet seat 2 each 0   olopatadine (PATANOL) 0.1 % ophthalmic solution Place 1 drop into both eyes 2 (two) times daily. 5 mL 3   oxcarbazepine (TRILEPTAL) 600 MG tablet Take 600 mg by mouth 2 (two) times daily.     oxyCODONE (ROXICODONE) 5 MG immediate release tablet Take 1 tablet (5 mg total) by mouth 3 (three) times daily as needed for moderate pain (pain score 4-6). 90 tablet 0   pantoprazole (PROTONIX) 40 MG tablet Take 1 tablet (40 mg total) by mouth daily. 90 tablet 3   potassium chloride SA (KLOR-CON) 20 MEQ tablet Take 1 tablet (20 mEq total) by mouth 2 (two) times daily for 7 days. 14 tablet 0   predniSONE (DELTASONE) 10 MG tablet TAKE ONE TABLET BY MOUTH DAILY WITH BREAKFAST 30 tablet 2   sodium chloride HYPERTONIC 3 % nebulizer solution 3 ml via neb twice daily 750 mL 12   traZODone (DESYREL) 50 MG tablet Take 50 mg by mouth at bedtime.     triamcinolone cream (KENALOG) 0.1 % Apply topically 2 (two) times daily as needed.     Vitamin D, Ergocalciferol, (DRISDOL) 1.25 MG (50000 UNIT) CAPS capsule TAKE ONE CAPSULE BY MOUTH ONCE WEEKLY FOR SIX WEEKS 6 capsule 0   Vitamin D, Ergocalciferol, (DRISDOL) 1.25 MG (50000 UNIT) CAPS capsule Take 1 capsule (50,000 Units total) by mouth every 7 (seven) days. 8 capsule 0   No current facility-administered medications on file prior to visit.   Allergies  Allergen Reactions   Influenza Vaccines    Aspirin     Other Reaction(s): GI Intolerance   Family History  Problem Relation Age of Onset   Cancer Father    PE: BP 130/80   Pulse 72   Ht 5\' 3"  (1.6 m)   Wt 175 lb 9.6 oz (79.7 kg)   SpO2 93% Comment: 3L of O2  BMI 31.11 kg/m  Wt Readings from Last 3 Encounters:  01/22/24 175 lb 9.6 oz (79.7 kg)  01/14/24 172 lb (78 kg)   01/02/24 180 lb 6.4 oz (81.8 kg)   Constitutional: overweight, in NAD, in wheelchair, on oxygen (3-4 lpm) Eyes: no exophthalmos, no lid lag, no stare ENT: no thyromegaly and no thyroid nodules palpated but left thyroid fullness, no cervical lymphadenopathy Cardiovascular: RRR, No MRG Respiratory: CTA B Musculoskeletal: no deformities Skin: no rashes Neurological: no tremor with outstretched hands  ASSESSMENT: 1.  Toxic adenoma with thyrotoxicosis  2.  Thyroid nodules  3.  Vitamin D deficiency - Per PCP  PLAN:  1. Patient with history of thyrotoxicosis, previously with thyrotoxic symptoms: Weight loss, heat intolerance, hyperdefecation, palpitations, anxiety, all resolved on methimazole. -She had a thyroid uptake and scan in 2022 and this showed a left hyperfunctioning thyroid nodule as a possible cause for her thyrotoxicosis -She continues on methimazole 5 mg daily with good tolerance -In 01/2023, she had a slightly low TSH, at 0.26, but this could possibly have been related to taking prednisone the night before.  We did not change the regimen.  On  recheck in 03/2023, her TFTs were normal.  At last visit, we rechecked her TFTs and they were again all normal.  We continued the same dose of methimazole. -We discussed in the past about options for treatment for her thyrotoxicosis and she agreed for RAI treatment if needed, but we did not have to use it. -At today's visit we will recheck her TFTs and change the methimazole dose accordingly -I will see her back in 1 year  2.  Thyroid nodules -Patient with a history of subcentimeter right thyroid nodules, not worrisome, but also a large left thyroid nodule that showed lobulation and macrocalcifications.  This was most likely the site of the thyroid overproduction, detected on the thyroid uptake and scan.  We discussed that overactive nodules are rarely malignant, however, due to the distorted architecture, we biopsied the nodule in 09/2022.   The results were inconclusive, however, the Afirma molecular marker returned benign.  Therefore, we decided to only follow the nodule, since the risk of cancer was very low. -In the past, she was worried about getting thyroid surgery due to poor lung status and was also worried about keloid.  We did discuss about possibly doing RAI treatment to shrink the nodules but if we proceeded with this, we discussed about the need for another thyroid uptake.  Another option is radiofrequency ablation.  We discussed about this at today's visit I wrote it down for her so she can look into that and let me know if she wants to proceed with it. -In 2023, she had some neck compression symptoms including dysphagia, shortness of breath, and choking but they improved so we continued to follow her expectantly. -At today's visit, her neck compression symptoms are stable and he -We will recheck a thyroid ultrasound at next visit  Orders Placed This Encounter  Procedures   TSH   T4, free   T3, free   Carlus Pavlov, MD PhD [facility]

## 2024-01-22 NOTE — Patient Instructions (Addendum)
 Please stop at the lab.  Please continue Methimazole 5 mg daily.  Look up "radio frequency ablation" of the thyroid nodule.  You should have an endocrinology follow-up appointment in 6 months.

## 2024-01-23 ENCOUNTER — Encounter: Payer: Self-pay | Admitting: Internal Medicine

## 2024-01-23 LAB — TSH: TSH: 0.88 m[IU]/L (ref 0.40–4.50)

## 2024-01-23 LAB — T3, FREE: T3, Free: 2.4 pg/mL (ref 2.3–4.2)

## 2024-01-23 LAB — T4, FREE: Free T4: 0.9 ng/dL (ref 0.8–1.8)

## 2024-01-29 ENCOUNTER — Other Ambulatory Visit: Payer: Self-pay | Admitting: Pulmonary Disease

## 2024-02-01 DIAGNOSIS — J449 Chronic obstructive pulmonary disease, unspecified: Secondary | ICD-10-CM | POA: Diagnosis not present

## 2024-02-02 ENCOUNTER — Other Ambulatory Visit: Payer: Self-pay | Admitting: Medical

## 2024-02-04 ENCOUNTER — Other Ambulatory Visit: Payer: Self-pay | Admitting: Medical

## 2024-02-04 DIAGNOSIS — J449 Chronic obstructive pulmonary disease, unspecified: Secondary | ICD-10-CM | POA: Diagnosis not present

## 2024-02-04 MED ORDER — LOSARTAN POTASSIUM 25 MG PO TABS: 25.0000 mg | ORAL_TABLET | Freq: Every day | ORAL | 1 refills | Status: DC

## 2024-02-04 NOTE — Telephone Encounter (Signed)
 Copied from CRM [id number]. Topic: Clinical - Medication Refill >> Feb 04, 2024 12:52 PM Thomes Dinning wrote: Most Recent Primary Care Visit:  Provider: Esperanza Richters  Department: [facility]  Visit Type: ACUTE  Date: 01/14/2024  Medication: losartan (COZAAR) 25 MG tablet   Has the patient contacted their pharmacy? No (Agent: If no, request that the patient contact the pharmacy for the refill. If patient does not wish to contact the pharmacy document the reason why and proceed with request.) (Agent: If yes, when and what did the pharmacy advise?)  Is this the correct pharmacy for this prescription? Yes If no, delete pharmacy and type the correct one.  This is the patient's preferred pharmacy:   [id number] - [place], [place] - [address], [address] AT [facility] [address], [address] [place] [address] Phone: [phone] Fax: [phone]   Has the prescription been filled recently? Yes  Is the patient out of the medication? Yes  Has the patient been seen for an appointment in the last year OR does the patient have an upcoming appointment? Yes  Can we respond through MyChart? Yes  Agent: Please be advised that Rx refills may take up to 3 business days. We ask that you follow-up with your pharmacy.

## 2024-02-06 DIAGNOSIS — J449 Chronic obstructive pulmonary disease, unspecified: Secondary | ICD-10-CM | POA: Diagnosis not present

## 2024-02-08 ENCOUNTER — Ambulatory Visit: Payer: Self-pay | Admitting: Licensed Clinical Social Worker

## 2024-02-08 ENCOUNTER — Other Ambulatory Visit: Payer: Self-pay | Admitting: Medical

## 2024-02-08 NOTE — Telephone Encounter (Signed)
 Copied from CRM [id number]. Topic: Clinical - Medication Refill >> Feb 08, 2024  1:47 PM Orinda Kenner C wrote: Most Recent Primary Care Visit:  Provider: Esperanza Richters  Department: [facility]  Visit Type: ACUTE  Date: 01/14/2024  Medication: ibuprofen (ADVIL) 800 MG tablet   Has the patient contacted their pharmacy? Yes (Agent: If no, request that the patient contact the pharmacy for the refill. If patient does not wish to contact the pharmacy document the reason why and proceed with request.) (Agent: If yes, when and what did the pharmacy advise?)  Is this the correct pharmacy for this prescription? Yes If no, delete pharmacy and type the correct one.  This is the patient's preferred pharmacy:  [facility] [address] [place] - [phone] [place] [address] Phone: [phone] Fax: [phone]   Has the prescription been filled recently? No  Is the patient out of the medication? No, has 5 pills left.   Has the patient been seen for an appointment in the last year OR does the patient have an upcoming appointment? Yes  Can we respond through MyChart? No, please call at [phone]  Agent: Please be advised that Rx refills may take up to 3 business days. We ask that you follow-up with your pharmacy.

## 2024-02-09 MED ORDER — IBUPROFEN 800 MG PO TABS: 800.0000 mg | ORAL_TABLET | Freq: Three times a day (TID) | ORAL | 0 refills | Status: DC | PRN

## 2024-02-09 NOTE — Telephone Encounter (Signed)
 Low number of ibuprofen. Hx of gerd and htn. Best not to overuse ibuprofen. So sent in 15 tab rx. On follow up will repeat metabolic panel. Advise pt to use ibuprofen sparingly.

## 2024-02-11 ENCOUNTER — Encounter: Payer: Self-pay | Admitting: Registered Nurse

## 2024-02-11 ENCOUNTER — Encounter: Payer: 59 | Attending: Physical Medicine and Rehabilitation | Admitting: Registered Nurse

## 2024-02-11 VITALS — BP 119/71 | HR 74 | Ht 63.0 in | Wt 176.2 lb

## 2024-02-11 DIAGNOSIS — M5412 Radiculopathy, cervical region: Secondary | ICD-10-CM | POA: Insufficient documentation

## 2024-02-11 DIAGNOSIS — M25561 Pain in right knee: Secondary | ICD-10-CM | POA: Insufficient documentation

## 2024-02-11 DIAGNOSIS — G8929 Other chronic pain: Secondary | ICD-10-CM | POA: Diagnosis not present

## 2024-02-11 DIAGNOSIS — Z5181 Encounter for therapeutic drug level monitoring: Secondary | ICD-10-CM | POA: Insufficient documentation

## 2024-02-11 DIAGNOSIS — Z79891 Long term (current) use of opiate analgesic: Secondary | ICD-10-CM | POA: Insufficient documentation

## 2024-02-11 DIAGNOSIS — M2550 Pain in unspecified joint: Secondary | ICD-10-CM | POA: Insufficient documentation

## 2024-02-11 DIAGNOSIS — G894 Chronic pain syndrome: Secondary | ICD-10-CM | POA: Diagnosis not present

## 2024-02-11 DIAGNOSIS — G610 Guillain-Barre syndrome: Secondary | ICD-10-CM | POA: Insufficient documentation

## 2024-02-11 DIAGNOSIS — M25562 Pain in left knee: Secondary | ICD-10-CM | POA: Insufficient documentation

## 2024-02-11 DIAGNOSIS — M542 Cervicalgia: Secondary | ICD-10-CM | POA: Insufficient documentation

## 2024-02-11 MED ORDER — OXYCODONE HCL 5 MG PO TABS: 5.0000 mg | ORAL_TABLET | Freq: Three times a day (TID) | ORAL | 0 refills | Status: DC | PRN

## 2024-02-11 NOTE — Progress Notes (Signed)
 Subjective:    Patient ID: Jill Spence, female    DOB: December 07, 1950, 73 y.o.   MRN: [id number]  HPI: Jill Spence is a 73 y.o. female who returns for follow up appointment for chronic pain and medication refill. She states her pain is located in her .neck radiating into her bilateral shoulders and bilateral knee pain. She rates her pain 8. Her current exercise regime is walking and performing stretching exercises.  Ms. Mask Morphine equivalent is 22.50  MME.   Oral Swab was Performed today.     Pain Inventory Average Pain 8 Pain Right Now 8 My pain is  throbbing  In the last 24 hours, has pain interfered with the following? General activity 5 Relation with others 5 Enjoyment of life 5 What TIME of day is your pain at its worst? night Sleep (in general) Good  Pain is worse with: walking, bending, standing, some activites, and WEATHER! Pain improves with: medication Relief from Meds: 5  Family History  Problem Relation Age of Onset   Cancer Father    Social History   Socioeconomic History   Marital status: Widowed    Spouse name: Not on file   Number of children: Not on file   Years of education: Not on file   Highest education level: Some college, no degree  Occupational History   Not on file  Tobacco Use   Smoking status: Former    Current packs/day: 0.00    Average packs/day: 0.3 packs/day for 6.0 years (1.5 ttl pk-yrs)    Types: Cigarettes    Start date: 10/13/2001    Quit date: 10/14/2007    Years since quitting: 16.3   Smokeless tobacco: Never  Vaping Use   Vaping status: Never Used  Substance and Sexual Activity   Alcohol use: No   Drug use: No   Sexual activity: Not on file  Other Topics Concern   Not on file  Social History Narrative   Not on file   Social Drivers of Health   Financial Resource Strain: Medium Risk (01/14/2024)   Overall Financial Resource Strain (CARDIA)    Difficulty of Paying Living Expenses: Somewhat hard  Food  Insecurity: No Food Insecurity (01/14/2024)   Hunger Vital Sign    Worried About Running Out of Food in the Last Year: Never true    Ran Out of Food in the Last Year: Never true  Transportation Needs: No Transportation Needs (01/14/2024)   PRAPARE - Administrator, Civil Service (Medical): No    Lack of Transportation (Non-Medical): No  Physical Activity: Inactive (01/14/2024)   Exercise Vital Sign    Days of Exercise per Week: 0 days    Minutes of Exercise per Session: 30 min  Stress: Stress Concern Present (01/14/2024)   Jill Spence [employer] Occupational [employer] - Occupational Stress Questionnaire    Feeling of Stress : Very much  Social Connections: Moderately Isolated (01/14/2024)   Social Connection and Isolation Panel [NHANES]    Frequency of Communication with Friends and Family: More than three times a week    Frequency of Social Gatherings with Friends and Family: Once a week    Attends Religious Services: Never    Database administrator or Organizations: Yes    Attends Banker Meetings: 1 to 4 times per year    Marital Status: Widowed   Past Surgical History:  Procedure Laterality Date   BREAST SURGERY     COLONOSCOPY  EYE SURGERY Bilateral    cataract surgery with lens implants   MANDIBLE RECONSTRUCTION     MINOR REMOVAL OF MANDIBULAR HARDWARE Right 10/15/2015   Procedure: MINOR REMOVAL OF Right MANDIBULAR HARDWARE;  Surgeon: Ocie Doyne, DDS;  Location: [facility] OR;  Service: Oral Surgery;  Laterality: Right;   TONSILLECTOMY     Past Surgical History:  Procedure Laterality Date   BREAST SURGERY     COLONOSCOPY     EYE SURGERY Bilateral    cataract surgery with lens implants   MANDIBLE RECONSTRUCTION     MINOR REMOVAL OF MANDIBULAR HARDWARE Right 10/15/2015   Procedure: MINOR REMOVAL OF Right MANDIBULAR HARDWARE;  Surgeon: Ocie Doyne, DDS;  Location: [facility] OR;  Service: Oral Surgery;  Laterality: Right;   TONSILLECTOMY     Past Medical History:   Diagnosis Date   Anemia    low iron   Anxiety    Arthritis    Bipolar disorder (HCC)    Cancer (HCC) 1973   COPD (chronic obstructive pulmonary disease) (HCC)    Family history of adverse reaction to anesthesia    mom "was put to sleep and she never woke up" -    GERD (gastroesophageal reflux disease)    Guillain Barr syndrome (HCC)    numbness in toes, legs hurt   Headache    migraines as a teenager   IBS (irritable bowel syndrome)    Lump, breast    removed years ago per Pt.   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, has "quiet' seizures   Thyroid disease    BP 119/71   Pulse 74   Ht 5\' 3"  (1.6 m)   Wt 176 lb 3.2 oz (79.9 kg)   SpO2 (!) 88% Comment: 3L [place] -- has emphysema and reports 21% lung capacity  BMI 31.21 kg/m   Opioid Risk Score:   Fall Risk Score:  `1  Depression screen PHQ 2/9     02/11/2024    1:33 PM 05/02/2023    3:50 PM 04/19/2023    1:43 PM 02/20/2023    1:54 PM 12/07/2022    1:42 PM 10/05/2022    1:58 PM 08/10/2022    1:30 PM  Depression screen PHQ 2/9  Decreased Interest 0 0 0 0 0 0 1  Down, Depressed, Hopeless 0 0 0 0 0 0 1  PHQ - 2 Score 0 0 0 0 0 0 2     Review of Systems  Musculoskeletal:  Positive for back pain and gait problem.  All other systems reviewed and are negative.      Objective:   Physical Exam Vitals and nursing note reviewed.  Constitutional:      Appearance: Normal appearance.  Cardiovascular:     Rate and Rhythm: Normal rate and regular rhythm.     Pulses: Normal pulses.     Heart sounds: Normal heart sounds.  Pulmonary:     Effort: Pulmonary effort is normal.     Breath sounds: Normal breath sounds.     Comments: Continuous Oxygen 3 liters Nasal Cannula  Musculoskeletal:     Comments: Normal Muscle Bulk and Muscle Testing Reveals:  Upper Extremities: Full ROM and Muscle Strength 5/5 Lower  Extremities: Right: Decreased ROM and Muscle  Strength 5/5 Right Lower Extremity Flexion Produces Pain into her Right Patella Left Lower Extremity: Full ROM and Muscle Strength 5/5 Arises  from Table slowly using walker or support Narrow Based Gait     Skin:    General: Skin is warm and dry.  Neurological:     Mental Status: She is alert and oriented to person, place, and time.  Psychiatric:        Mood and Affect: Mood normal.        Behavior: Behavior normal.         Assessment & Plan:  Guillain Barre Syndrome: Continue current medication regimen. Continue Outpatient Therapy. Continue current medication regimen. Continue to Monitor. 02/11/2024 Polyarthralgia: Continue current medication regimen . Continue to Monitor. 02/11/2024 Chronic Bilateral Low Back Pain: Continue HEP as Tolerated. Continue to Monitor. 02/11/2024 Chronic Pain of Bilateral Knee Pain R>L: Continue Outpatient Therapy. Continue current medication regimen. Continue to Monitor. 02/11/2024 Chronic Pain Syndrome:  Continue Oxycodone 5 mg three times a day as needed for pain. #90.Second script sent for the following month.  We will continue the opioid monitoring program, this consists of regular clinic visits, examinations, urine drug screen, pill counts as well as use of [place] Controlled Substance Reporting system. A 12 month History has been reviewed on the [place] Controlled Substance Reporting System on 02/11/2024   F/U in 2 months

## 2024-02-12 ENCOUNTER — Ambulatory Visit: Payer: 59 | Admitting: Registered Nurse

## 2024-02-12 NOTE — Patient Outreach (Signed)
 Care Coordination   Follow Up Visit Note   02/08/2024 Name: Jill Spence MRN: [id number] DOB: 1951-01-19  Jill Spence is a 73 y.o. year old female who sees [facility], Ramon Dredge, [place] for primary care. I spoke with  Jill Spence by phone today.  What matters to the patients health and wellness today?  Symptom Management    Goals Addressed             This Visit's Progress    LCSW-Management of MH/Stress   On track    Activities and task to complete in order to accomplish goals.   Keep all upcoming appointments discussed today Continue with compliance of taking medication prescribed by Doctor Implement healthy coping skills discussed to assist with management of symptoms Follow up with Department of Social Services  Follow up with [name] about additional benefits. Reference booklet for eligibility criteria Submit letters from specialists to [name] to inquire about additional home care hours Inform PCP about BP logs and refill needs         SDOH assessments and interventions completed:  No     Care Coordination Interventions:  Yes, provided  Interventions Today    Flowsheet Row Most Recent Value  Chronic Disease   Chronic disease during today's visit Chronic Obstructive Pulmonary Disease (COPD)  General Interventions   General Interventions Discussed/Reviewed General Interventions Reviewed, Doctor Visits, Level of Care  Doctor Visits Discussed/Reviewed Doctor Visits Reviewed  Level of Care Personal Care Services  [Pt's nurse quit a couple of days ago, a temporary replacement has been assisting pt until they find a permanent option.]  Mental Health Interventions   Mental Health Discussed/Reviewed Mental Health Reviewed, Coping Strategies, Anxiety  [Strategies to assist with stress and anxiety management discussed.]  Nutrition Interventions   Nutrition Discussed/Reviewed Nutrition Reviewed, Decreasing salt  Pharmacy Interventions   Pharmacy  Dicussed/Reviewed Pharmacy Topics Reviewed, Medication Adherence  Safety Interventions   Safety Discussed/Reviewed Safety Reviewed       Follow up plan: Follow up call scheduled for 2-4 weeks    Encounter Outcome:  Patient Visit Completed   Jill Lucks, LCSW [place]  [facility], [facility] Clinical Social Worker Direct Dial: [phone]  Fax: [phone] Website: [email or web address] 10:36 AM

## 2024-02-12 NOTE — Patient Instructions (Signed)
 Visit Information  Thank you for taking time to visit with me today. Please don't hesitate to contact me if I can be of assistance to you.   Following are the goals we discussed today:   Goals Addressed             This Visit's Progress    LCSW-Management of MH/Stress   On track    Activities and task to complete in order to accomplish goals.   Keep all upcoming appointments discussed today Continue with compliance of taking medication prescribed by Doctor Implement healthy coping skills discussed to assist with management of symptoms Follow up with Department of Social Services  Follow up with [name] about additional benefits. Reference booklet for eligibility criteria Submit letters from specialists to [name] to inquire about additional home care hours Inform PCP about BP logs and refill needs         Our next appointment is by telephone on 3/28 at 1 PM  Please call the care guide team at [phone] if you need to cancel or reschedule your appointment.   If you are experiencing a Mental Health or Behavioral Health Crisis or need someone to talk to, please call the Suicide and Crisis Lifeline: 988 call 911   Patient verbalizes understanding of instructions and care plan provided today and agrees to view in MyChart. Active MyChart status and patient understanding of how to access instructions and care plan via MyChart confirmed with patient.     Windy Fast [facility]  [facility], [facility] Clinical Social Worker Direct Dial: [phone]  Fax: [phone] Website: [name] 10:36 AM

## 2024-02-15 LAB — DRUG TOX MONITOR 1 W/CONF, ORAL FLD
Amphetamines: NEGATIVE ng/mL (ref <10)
Barbiturates: NEGATIVE ng/mL (ref <10)
Benzodiazepines: NEGATIVE ng/mL (ref <0.50)
Buprenorphine: NEGATIVE ng/mL (ref <0.10)
Cocaine: NEGATIVE ng/mL (ref <5.0)
Codeine: NEGATIVE ng/mL (ref <2.5)
Dihydrocodeine: NEGATIVE ng/mL (ref <2.5)
Fentanyl: NEGATIVE ng/mL (ref <0.10)
Heroin Metabolite: NEGATIVE ng/mL (ref <1.0)
Hydrocodone: NEGATIVE ng/mL (ref <2.5)
Hydromorphone: NEGATIVE ng/mL (ref <2.5)
MARIJUANA: NEGATIVE ng/mL (ref <2.5)
MDMA: NEGATIVE ng/mL (ref <10)
Meprobamate: NEGATIVE ng/mL (ref <2.5)
Methadone: NEGATIVE ng/mL (ref <5.0)
Morphine: NEGATIVE ng/mL (ref <2.5)
Nicotine Metabolite: NEGATIVE ng/mL (ref <5.0)
Norhydrocodone: NEGATIVE ng/mL (ref <2.5)
Noroxycodone: 8.2 ng/mL — ABNORMAL HIGH (ref <2.5)
Opiates: POSITIVE ng/mL — AB (ref <2.5)
Oxycodone: 69.4 ng/mL — ABNORMAL HIGH (ref <2.5)
Oxymorphone: NEGATIVE ng/mL (ref <2.5)
Phencyclidine: NEGATIVE ng/mL (ref <10)
Tapentadol: NEGATIVE ng/mL (ref <5.0)
Tramadol: NEGATIVE ng/mL (ref <5.0)
Zolpidem: NEGATIVE ng/mL (ref <5.0)

## 2024-02-15 LAB — DRUG TOX ALC METAB W/CON, ORAL FLD: Alcohol Metabolite: NEGATIVE ng/mL (ref <25)

## 2024-02-17 ENCOUNTER — Other Ambulatory Visit: Payer: Self-pay | Admitting: Medical

## 2024-02-20 ENCOUNTER — Ambulatory Visit: Payer: 59 | Admitting: Primary Care

## 2024-02-25 ENCOUNTER — Ambulatory Visit: Payer: 59 | Admitting: Primary Care

## 2024-02-25 ENCOUNTER — Telehealth: Payer: Self-pay | Admitting: Pulmonary Disease

## 2024-02-25 MED ORDER — IPRATROPIUM-ALBUTEROL 0.5-2.5 (3) MG/3ML IN SOLN: 3.0000 mL | RESPIRATORY_TRACT | 2 refills | Status: DC | PRN

## 2024-02-25 MED ORDER — TRELEGY ELLIPTA 200-62.5-25 MCG/ACT IN AEPB: 1.0000 | INHALATION_SPRAY | Freq: Every day | RESPIRATORY_TRACT | 0 refills | Status: DC

## 2024-02-25 NOTE — Telephone Encounter (Signed)
 Spoke to patient.  C/o prod cough with yellow sputum, wheezing mainly at night, chills. sweats and increased SOB x2wk. She is unsure if she has a fever, as she has not measured her temp.  No recent covid or flu test.  Oxygen usage has increased. She now has to wear 3L cont.  Spo2 is maintaining between 92-95% She is using trelegy once daily, albuterol HFA TID and duoneb. She is requesting prednisone Rx.  Dr. Isaiah Serge, please advise.   Trelegy and duoneb sent to preferred pharmacy per patient request.

## 2024-02-25 NOTE — Telephone Encounter (Signed)
 Patient needs refills of her medications. Since her appointment in December she was supposed to be seen by Clent Ridges [facility] but have had to reschedule due to Clent Ridges being out of office. She also needs prednisone due to a current infection.    Pharmacy:[facility]

## 2024-02-29 ENCOUNTER — Encounter: Payer: Self-pay | Admitting: Licensed Clinical Social Worker

## 2024-02-29 ENCOUNTER — Other Ambulatory Visit: Payer: Self-pay | Admitting: Medical

## 2024-03-02 DIAGNOSIS — J449 Chronic obstructive pulmonary disease, unspecified: Secondary | ICD-10-CM | POA: Diagnosis not present

## 2024-03-04 DIAGNOSIS — J449 Chronic obstructive pulmonary disease, unspecified: Secondary | ICD-10-CM | POA: Diagnosis not present

## 2024-03-04 DIAGNOSIS — K219 Gastro-esophageal reflux disease without esophagitis: Secondary | ICD-10-CM | POA: Diagnosis not present

## 2024-03-04 DIAGNOSIS — G610 Guillain-Barre syndrome: Secondary | ICD-10-CM | POA: Diagnosis not present

## 2024-03-05 ENCOUNTER — Other Ambulatory Visit: Payer: Self-pay

## 2024-03-05 ENCOUNTER — Ambulatory Visit (INDEPENDENT_AMBULATORY_CARE_PROVIDER_SITE_OTHER): Admitting: Medical

## 2024-03-05 VITALS — BP 134/70 | HR 83 | Resp 20 | Ht 63.0 in | Wt 172.8 lb

## 2024-03-05 DIAGNOSIS — L309 Dermatitis, unspecified: Secondary | ICD-10-CM | POA: Diagnosis not present

## 2024-03-05 DIAGNOSIS — J449 Chronic obstructive pulmonary disease, unspecified: Secondary | ICD-10-CM

## 2024-03-05 DIAGNOSIS — J309 Allergic rhinitis, unspecified: Secondary | ICD-10-CM

## 2024-03-05 DIAGNOSIS — M2550 Pain in unspecified joint: Secondary | ICD-10-CM | POA: Diagnosis not present

## 2024-03-05 DIAGNOSIS — I10 Essential (primary) hypertension: Secondary | ICD-10-CM | POA: Diagnosis not present

## 2024-03-05 DIAGNOSIS — E559 Vitamin D deficiency, unspecified: Secondary | ICD-10-CM

## 2024-03-05 MED ORDER — VITAMIN D (ERGOCALCIFEROL) 1.25 MG (50000 UNIT) PO CAPS: ORAL_CAPSULE | ORAL | 0 refills | Status: DC

## 2024-03-05 MED ORDER — TRELEGY ELLIPTA 200-62.5-25 MCG/ACT IN AEPB: 1.0000 | INHALATION_SPRAY | Freq: Every day | RESPIRATORY_TRACT | 0 refills | Status: DC

## 2024-03-05 MED ORDER — DICLOFENAC SODIUM 1 % EX GEL: 4.0000 g | Freq: Four times a day (QID) | CUTANEOUS | 3 refills | Status: DC

## 2024-03-05 MED ORDER — LEVOCETIRIZINE DIHYDROCHLORIDE 5 MG PO TABS: 5.0000 mg | ORAL_TABLET | Freq: Every evening | ORAL | 3 refills | Status: DC

## 2024-03-05 MED ORDER — OLOPATADINE HCL 0.1 % OP SOLN: 1.0000 [drp] | Freq: Two times a day (BID) | OPHTHALMIC | 12 refills | Status: DC

## 2024-03-05 MED ORDER — DICYCLOMINE HCL 20 MG PO TABS: 20.0000 mg | ORAL_TABLET | Freq: Three times a day (TID) | ORAL | 2 refills | Status: DC | PRN

## 2024-03-05 MED ORDER — TRIAMCINOLONE ACETONIDE 0.1 % EX CREA: 1.0000 | TOPICAL_CREAM | Freq: Two times a day (BID) | CUTANEOUS | 0 refills | Status: DC

## 2024-03-05 MED ORDER — ALBUTEROL SULFATE HFA 108 (90 BASE) MCG/ACT IN AERS: 2.0000 | INHALATION_SPRAY | Freq: Four times a day (QID) | RESPIRATORY_TRACT | 11 refills | Status: DC | PRN

## 2024-03-05 MED ORDER — PANTOPRAZOLE SODIUM 40 MG PO TBEC: 40.0000 mg | DELAYED_RELEASE_TABLET | Freq: Every day | ORAL | 3 refills | Status: DC

## 2024-03-05 MED ORDER — METHIMAZOLE 5 MG PO TABS: 5.0000 mg | ORAL_TABLET | Freq: Every day | ORAL | 1 refills | Status: DC

## 2024-03-05 MED ORDER — IBUPROFEN 200 MG PO TABS: ORAL_TABLET | ORAL | 0 refills | Status: DC

## 2024-03-05 MED ORDER — BENZONATATE 100 MG PO CAPS: ORAL_CAPSULE | ORAL | 1 refills | Status: DC

## 2024-03-05 MED ORDER — METHYLPREDNISOLONE ACETATE 40 MG/ML IJ SUSP
40.0000 mg | Freq: Once | INTRAMUSCULAR | Status: AC
  Administered 2024-03-05: 40 mg via INTRAMUSCULAR

## 2024-03-05 MED ORDER — HYDROXYZINE HCL 10 MG PO TABS: 10.0000 mg | ORAL_TABLET | Freq: Every evening | ORAL | 0 refills | Status: DC | PRN

## 2024-03-05 MED ORDER — LOPERAMIDE HCL 2 MG PO CAPS: ORAL_CAPSULE | ORAL | 5 refills | Status: DC

## 2024-03-05 MED ORDER — FAMCICLOVIR 250 MG PO TABS: 250.0000 mg | ORAL_TABLET | Freq: Two times a day (BID) | ORAL | 0 refills | Status: DC

## 2024-03-05 NOTE — Progress Notes (Signed)
 Subjective:    Patient ID: Jill Spence, female    DOB: 07/06/1951, 73 y.o.   MRN: [id number]  HPI Discussed the use of AI scribe software for clinical note transcription with the patient, who gave verbal consent to proceed.  History of Present Illness   Jill Spence is a 73 year old female with COPD who presents with worsening cough and joint pain.  She has been experiencing a worsening cough, which she attributes to pollen exposure. Even indoors, she continues to cough and feels a burning sensation in her throat. She has been unable to see her pulmonologist due to multiple canceled appointments, with the next possible appointment being in June.. She describes her breathing similar to has she has done on piror visits and her o2 sat is 97%. She requires refills for her COPD medications, including Trelegy and albuterol inhaler, and mentions having enough medication for her nebulizer. She is currently using Trelegy and an albuterol inhaler, which are filled at [facility] pharmacy.  She reports significant joint pain, particularly in her knees, elbows, and back, which she associates with sciatica and weather changes. She has been using ibuprofen in the past but feels she does not have enough to manage her pain, taking it twice a day. I am concerned about the potential impact of ibuprofen on her kidneys and she understand since  someone she knows experienced kidney issues leading to dialysis with overuse of nsaid. She is currently prescribed ibuprofen 800 mg and is considering using a lower dose to manage her pain more effectively combined with tylenol.  She describes hyperpigmentation  of her elbows, which have worsened over the past few months. She attributes this to leaning on surfaces frequently due to difficulty breathing when sitting down. She has been using Vaseline, vitamin E, and baby lotion without improvement. She has also tried using elbow pads to prevent further irritation.  She  experiences seasonal allergies, which have worsened recently, causing her eyes and nose to burn. She has been using baby q-tips to clean her nose and ears. She has previously used Xyzal for allergies but is currently out of it. She reports difficulty sleeping due to her symptoms. She does not tolerate nasal sprays well and prefers oral medications for her allergies.             Review of Systems  Constitutional:  Negative for chills, fatigue and fever.  HENT:  Negative for congestion.   Respiratory:  Positive for wheezing. Negative for cough, chest tightness and shortness of breath.        Hx of copd. Stable  Cardiovascular:  Negative for chest pain and palpitations.  Gastrointestinal:  Negative for abdominal pain.  Genitourinary:  Negative for enuresis and flank pain.  Musculoskeletal:  Positive for back pain.       Knee pain  Neurological:  Negative for syncope, facial asymmetry, speech difficulty and light-headedness.  Hematological:  Negative for adenopathy. Does not bruise/bleed easily.  Psychiatric/Behavioral:  Negative for behavioral problems and confusion.     Past Medical History:  Diagnosis Date   Anemia    low iron   Anxiety    Arthritis    Bipolar disorder (HCC)    Cancer (HCC) 1973   COPD (chronic obstructive pulmonary disease) (HCC)    Family history of adverse reaction to anesthesia    mom "was put to sleep and she never woke up" -    GERD (gastroesophageal reflux disease)    Guillain Barr syndrome (HCC)  numbness in toes, legs hurt   Headache    migraines as a teenager   IBS (irritable bowel syndrome)    Lump, breast    removed years ago per Pt.   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, has "quiet' seizures   Thyroid disease      Social History   Socioeconomic History   Marital status: Widowed    Spouse name: Not on file   Number of children: Not on file   Years of  education: Not on file   Highest education level: Some college, no degree  Occupational History   Not on file  Tobacco Use   Smoking status: Former    Current packs/day: 0.00    Average packs/day: 0.3 packs/day for 6.0 years (1.5 ttl pk-yrs)    Types: Cigarettes    Start date: 10/13/2001    Quit date: 10/14/2007    Years since quitting: 16.4   Smokeless tobacco: Never  Vaping Use   Vaping status: Never Used  Substance and Sexual Activity   Alcohol use: No   Drug use: No   Sexual activity: Not on file  Other Topics Concern   Not on file  Social History Narrative   Not on file   Social Drivers of Health   Financial Resource Strain: Medium Risk (01/14/2024)   Overall Financial Resource Strain (CARDIA)    Difficulty of Paying Living Expenses: Somewhat hard  Food Insecurity: No Food Insecurity (01/14/2024)   Hunger Vital Sign    Worried About Running Out of Food in the Last Year: Never true    Ran Out of Food in the Last Year: Never true  Transportation Needs: No Transportation Needs (01/14/2024)   PRAPARE - Administrator, Civil Service (Medical): No    Lack of Transportation (Non-Medical): No  Physical Activity: Inactive (01/14/2024)   Exercise Vital Sign    Days of Exercise per Week: 0 days    Minutes of Exercise per Session: 30 min  Stress: Stress Concern Present (01/14/2024)   [employer] of Occupational Health - Occupational Stress Questionnaire    Feeling of Stress : Very much  Social Connections: Moderately Isolated (01/14/2024)   Social Connection and Isolation Panel [NHANES]    Frequency of Communication with Friends and Family: More than three times a week    Frequency of Social Gatherings with Friends and Family: Once a week    Attends Religious Services: Never    Database administrator or Organizations: Yes    Attends Banker Meetings: 1 to 4 times per year    Marital Status: Widowed  Catering manager Violence: Not on file    Past  Surgical History:  Procedure Laterality Date   BREAST SURGERY     COLONOSCOPY     EYE SURGERY Bilateral    cataract surgery with lens implants   MANDIBLE RECONSTRUCTION     MINOR REMOVAL OF MANDIBULAR HARDWARE Right 10/15/2015   Procedure: MINOR REMOVAL OF Right MANDIBULAR HARDWARE;  Surgeon: Ocie Doyne, DDS;  Location: [facility] OR;  Service: Oral Surgery;  Laterality: Right;   TONSILLECTOMY      Family History  Problem Relation Age of Onset   Cancer Father     Allergies  Allergen Reactions   Influenza Vaccines    Aspirin     Other Reaction(s): GI Intolerance    Current Outpatient  Medications on File Prior to Visit  Medication Sig Dispense Refill   albuterol (PROVENTIL) (2.5 MG/3ML) 0.083% nebulizer solution Take 3 mLs (2.5 mg total) by nebulization every 6 (six) hours as needed for wheezing. 180 mL 2   alclomethasone (ACLOVATE) 0.05 % cream Apply topically 2 (two) times daily as needed.     alendronate (FOSAMAX) 70 MG tablet Take 70 mg by mouth every Sunday. Take with a full glass of water on an empty stomach.     ARIPiprazole (ABILIFY) 15 MG tablet Take 15 mg by mouth daily.     B Complex Vitamins (VITAMIN B COMPLEX) TABS Take 1 tablet by mouth daily. 30 tablet 5   benzonatate (TESSALON) 100 MG capsule Take 1 capsule (100 mg total) by mouth 3 (three) times daily as needed for cough. 30 capsule 0   benztropine (COGENTIN) 0.5 MG tablet Take 0.5 mg by mouth 2 (two) times daily.     Bepotastine Besilate (BEPREVE) 1.5 % SOLN Place 1 drop into both eyes 2 (two) times daily as needed. 10 mL 5   Bepotastine Besilate 1.5 % SOLN Apply to eye.     cromolyn (OPTICROM) 4 % ophthalmic solution 1 drop 4 (four) times daily.     dicyclomine (BENTYL) 10 MG capsule TAKE ONE CAPSULE BY MOUTH THREE TIMES DAILY BEFORE MEALS. 90 capsule 5   doxycycline (ADOXA) 100 MG tablet Take 1 tablet by mouth daily.     doxycycline (VIBRA-TABS) 100 MG tablet Take 1 tablet (100 mg total) by mouth 2 (two) times daily.  15 tablet 0   erythromycin ophthalmic ointment Apply to eye.     gabapentin (NEURONTIN) 400 MG capsule Take 3 capsules (1,200 mg total) by mouth 3 (three) times daily. 270 capsule 2   ipratropium-albuterol (DUONEB) 0.5-2.5 (3) MG/3ML SOLN Take 3 mLs by nebulization every 4 (four) hours as needed. 360 mL 2   levocetirizine (XYZAL) 5 MG tablet TAKE ONE TABLET BY MOUTH EVERY EVENING 30 tablet 3   losartan (COZAAR) 25 MG tablet TAKE 1 TABLET (25 MG TOTAL) BY MOUTH DAILY. 30 tablet 2   magnesium oxide (MAG-OX) 400 MG tablet Take 250 mg by mouth daily.     montelukast (SINGULAIR) 10 MG tablet Take 1 tablet (10 mg total) by mouth at bedtime. 90 tablet 3   NONFORMULARY OR COMPOUNDED ITEM Elevated toilet seat 2 each 0   olopatadine (PATANOL) 0.1 % ophthalmic solution Place 1 drop into both eyes 2 (two) times daily. 5 mL 3   oxcarbazepine (TRILEPTAL) 600 MG tablet Take 600 mg by mouth 2 (two) times daily.     oxyCODONE (ROXICODONE) 5 MG immediate release tablet Take 1 tablet (5 mg total) by mouth 3 (three) times daily as needed for moderate pain (pain score 4-6). 90 tablet 0   potassium chloride SA (KLOR-CON) 20 MEQ tablet Take 1 tablet (20 mEq total) by mouth 2 (two) times daily for 7 days. 14 tablet 0   predniSONE (DELTASONE) 10 MG tablet TAKE ONE TABLET BY MOUTH DAILY WITH BREAKFAST 30 tablet 2   sodium chloride HYPERTONIC 3 % nebulizer solution 3 ml via neb twice daily 750 mL 12   traZODone (DESYREL) 50 MG tablet Take 50 mg by mouth at bedtime.     triamcinolone cream (KENALOG) 0.1 % Apply topically 2 (two) times daily as needed.     Vitamin D, Ergocalciferol, (DRISDOL) 1.25 MG (50000 UNIT) CAPS capsule TAKE ONE CAPSULE BY MOUTH EVERY SEVEN DAYS 8 capsule 0   No  current facility-administered medications on file prior to visit.    BP 134/70   Pulse 83   Resp 20   Ht 5\' 3"  (1.6 m)   Wt 172 lb 12.8 oz (78.4 kg)   SpO2 97%   BMI 30.61 kg/m        Objective:   Physical Exam  General Mental  Status- Alert. General Appearance- Not in acute distress.    Skin General: Color- Normal Color. Moisture- Normal Moisture.   Neck Carotid Arteries- Normal color. Moisture- Normal Moisture. No carotid bruits. No JVD.   Chest and Lung Exam Auscultation: Breath Sounds:-CTA   Cardiovascular Auscultation:Rythm- RRR Murmurs & Other Heart Sounds:Auscultation of the heart reveals- No Murmurs.   Abdomen Inspection:-Inspeection Normal. Palpation/Percussion:Note:No mass. Palpation and Percussion of the abdomen reveal- Non Tender, Non Distended + BS, no rebound or guarding.     Neurologic Cranial Nerve exam:- CN III-XII intact(No nystagmus), symmetric smile. Strength:- 5/5 equal and symmetric strength both upper and lower extremities.    Lower ext- left calf minimally swollen at best compared to rt side. Negative homans signs. No obvious pedal edema.   Derm- hyperpigmented to both elbows eczema like. No warmth and no dc.       Assessment & Plan:    Assessment and Plan    Chronic Obstructive Pulmonary Disease (COPD) Worsening symptoms with persistent coughing and weakness. Oxygen saturation at 97%. Lungs are clear and not labored. - Refill Trelegy inhaler and send prescription to [facility] pharmacy. - Refill albuterol inhaler. - Ensure appointment with pulmonologist is confirmed.  Knee and Back Pain Significant pain, possibly weather-related. Advised caution with NSAID use due to potential renal effects. - Prescribe ibuprofen 200 mg tablets, 1-4 tablets every 8 hours as needed(dose adjust to preserve kidney function) Avoid higher dose unless needed.. - Advise combining lower dose ibuprofen with acetaminophen 325 mg. - Monitor renal function due to NSAID use.  Hypertension Blood pressure well-controlled at 134/70 mmHg on losartan 25 mg. - Continue losartan 25 mg daily. - Advise checking blood pressure 2-3 times a week. - Increase losartan dose if blood pressure trends above 140  mmHg.  Elbow Hyperpigmentation and Skin Irritation(eczema like) Hyperpigmented and irritated skin on elbows, likely from frequent leaning and irritant contact. - Prescribe triamcinolone cream twice daily for 7-10 days. - Recommend Palmer's moisturizer with vitamin E twice daily. - Advise using Dove moisturizing soap. - Encourage avoiding resting elbows on surfaces.  Seasonal Allergies Moderate to severe symptoms with nasal and ocular irritation. Intolerant to nasal sprays. - Refill Xyzal for allergy management. - Pt requested steroid injection. After disucssion decided on depomedrol 40 mg injection for severe symptoms. - Prescribe Patanol eye drops for ocular symptoms.   Follow up one month or sooner if needed       Time spent with patient today was  45 minutes which consisted of chart review, discussing diagnosis, work up treatment and documentation.

## 2024-03-05 NOTE — Patient Instructions (Addendum)
 Chronic Obstructive Pulmonary Disease (COPD) Worsening symptoms with persistent coughing and weakness. Oxygen saturation at 97%. - Refill Trelegy inhaler and send prescription to [facility] pharmacy. - Refill albuterol inhaler. - Ensure appointment with pulmonologist is confirmed.  Knee and Back Pain Significant pain, possibly weather-related. Advised caution with NSAID use due to potential renal effects. - Prescribe ibuprofen 200 mg tablets, 1-4 tablets every 8 hours as needed.(Dose adjust for safety as discussed) - Advise combining lower dose ibuprofen with acetaminophen 325 mg. - Monitor renal function due to NSAID use.  Hypertension Blood pressure well-controlled at 134/70 mmHg on losartan 25 mg. - Continue losartan 25 mg daily. - Advise checking blood pressure 2-3 times a week. - Increase losartan dose if blood pressure trends above 140 mmHg.  Elbow Hyperpigmentation and Skin Irritation(eczema like) Hyperpigmented and irritated skin on elbows, likely from frequent leaning and irritant contact. - Prescribe triamcinolone cream twice daily for 7-10 days. - Recommend Palmer's moisturizer with vitamin E twice daily. - Advise using Dove moisturizing soap. - Encourage avoiding resting elbows on surfaces.  Seasonal Allergies Moderate to severe symptoms with nasal and ocular irritation. Intolerant to nasal sprays. - Refill Xyzal for allergy management. - Pt requested steroid injection. After disucssion decided on depomedrol 40 mg injection for severe symptoms. - Prescribe Patanol eye drops for ocular symptoms.  Follow up one month or sooner if needed

## 2024-03-06 ENCOUNTER — Ambulatory Visit: Payer: Self-pay | Admitting: Licensed Clinical Social Worker

## 2024-03-06 MED ORDER — PREDNISONE 10 MG PO TABS: 40.0000 mg | ORAL_TABLET | Freq: Every day | ORAL | 0 refills | Status: AC

## 2024-03-06 NOTE — Patient Outreach (Signed)
 Care Coordination   Follow Up Visit Note   03/06/2024 Name: Jill Spence MRN: [id number] DOB: [birth year]  Jill Spence is a 73 y.o. year old female who sees [place], Ramon Dredge, [place] for primary care. I spoke with  [name] by phone today.  What matters to the patients health and wellness today?  Symptom Management and Level of Care    Goals Addressed             This Visit's Progress    LCSW-Management of MH/Stress   On track    Activities and task to complete in order to accomplish goals.   Keep all upcoming appointments discussed today Continue with compliance of taking medication prescribed by Doctor. Discuss with provider about medications that may be too expensive to purchase Implement healthy coping skills discussed to assist with management of symptoms Follow up with Department of Social Services  Follow up with [name] about additional benefits. Reference booklet for eligibility criteria Submit letters from specialists to [name] to inquire about additional home care hours Inform PCP about BP logs and refill needs          SDOH assessments and interventions completed:  No     Care Coordination Interventions:  Yes, provided  Interventions Today    Flowsheet Row Most Recent Value  Chronic Disease   Chronic disease during today's visit Chronic Obstructive Pulmonary Disease (COPD)  General Interventions   General Interventions Discussed/Reviewed General Interventions Reviewed, Doctor Visits, Level of Care  Doctor Visits Discussed/Reviewed Doctor Visits Reviewed, Specialist  Level of Care Personal Care Services  [Patient continues to work with aid agency on obtaining a full time aid. Agency has been assisting her with transportation to medical appts]  Mental Health Interventions   Mental Health Discussed/Reviewed Mental Health Reviewed, Coping Strategies, Anxiety  Pharmacy Interventions   Pharmacy Dicussed/Reviewed Pharmacy Topics  Reviewed, Affording Medications, Medication Adherence  Safety Interventions   Safety Discussed/Reviewed Safety Reviewed       Follow up plan: Follow up call scheduled for 2-4 weeks    Encounter Outcome:  Patient Visit Completed   [name], LCSW [place]  [facility], [facility] Clinical Social Worker Direct Dial: [phone]  Fax: [phone] Website: [name] 4:56 PM

## 2024-03-06 NOTE — Telephone Encounter (Signed)
 Please offer an acute visit if she still having symptoms I have already sent in prescription for prednisone 40 mg a day for 5 days

## 2024-03-06 NOTE — Telephone Encounter (Signed)
 Spoke to patient. She stated that her sx have worsened. C/o increased SOB, prod cough with yellow sputum and wheezing.  She was seen by PCP yesterday and was given depomedrol injection. She was not prescribed abx or prednisone. She completed prednisone roughly one week ago.  I have offered an acute visit for today and pt declined due to transportation. She requested an appt for next week. There is not availability next week and pt stated that the rest of the month does not work with her schedule.  She will keep scheduled appt for 04/07/24. Nothing further needed at this time.   I will route back to Dr. Isaiah Serge as an Lorain Childes

## 2024-03-06 NOTE — Patient Instructions (Signed)
 Visit Information  Thank you for taking time to visit with me today. Please don't hesitate to contact me if I can be of assistance to you.   Following are the goals we discussed today:   Goals Addressed             This Visit's Progress    LCSW-Management of MH/Stress   On track    Activities and task to complete in order to accomplish goals.   Keep all upcoming appointments discussed today Continue with compliance of taking medication prescribed by Doctor. Discuss with provider about medications that may be too expensive to purchase Implement healthy coping skills discussed to assist with management of symptoms Follow up with Department of Social Services  Follow up with [name] about additional benefits. Reference booklet for eligibility criteria Submit letters from specialists to [name] to inquire about additional home care hours Inform PCP about BP logs and refill needs          Our next appointment is by telephone on 4/24 at 1:30 PM  Please call the care guide team at [phone] if you need to cancel or reschedule your appointment.   If you are experiencing a Mental Health or Behavioral Health Crisis or need someone to talk to, please call the Suicide and Crisis Lifeline: 988 call 911   Patient verbalizes understanding of instructions and care plan provided today and agrees to view in MyChart. Active MyChart status and patient understanding of how to access instructions and care plan via MyChart confirmed with patient.     Windy Fast [facility]  [facility], [facility] Clinical Social Worker Direct Dial: [phone]  Fax: [phone] Website: [name] 4:57 PM

## 2024-03-08 DIAGNOSIS — J449 Chronic obstructive pulmonary disease, unspecified: Secondary | ICD-10-CM | POA: Diagnosis not present

## 2024-03-14 ENCOUNTER — Encounter: Admitting: Licensed Clinical Social Worker

## 2024-03-14 DIAGNOSIS — H524 Presbyopia: Secondary | ICD-10-CM | POA: Diagnosis not present

## 2024-03-14 DIAGNOSIS — H52203 Unspecified astigmatism, bilateral: Secondary | ICD-10-CM | POA: Diagnosis not present

## 2024-03-14 DIAGNOSIS — H40243 Residual stage of angle-closure glaucoma, bilateral: Secondary | ICD-10-CM | POA: Diagnosis not present

## 2024-03-14 DIAGNOSIS — Z961 Presence of intraocular lens: Secondary | ICD-10-CM | POA: Diagnosis not present

## 2024-03-14 DIAGNOSIS — H5201 Hypermetropia, right eye: Secondary | ICD-10-CM | POA: Diagnosis not present

## 2024-03-14 DIAGNOSIS — H47292 Other optic atrophy, left eye: Secondary | ICD-10-CM | POA: Diagnosis not present

## 2024-03-18 ENCOUNTER — Other Ambulatory Visit: Payer: Self-pay | Admitting: Medical

## 2024-03-18 ENCOUNTER — Other Ambulatory Visit: Payer: Self-pay

## 2024-03-18 NOTE — Telephone Encounter (Signed)
 Copied from CRM [id number]. Topic: Clinical - Prescription Issue >> Mar 18, 2024 10:33 AM [name] wrote: Reason for CRM: Patient is calling to have her losartan (COZAAR) 25 MG tablet sent to the [facility] on file, prescription [facility],  [name] [phone]

## 2024-03-18 NOTE — Telephone Encounter (Signed)
 Copied from CRM [id number]. Topic: Clinical - Medication Refill >> Mar 18, 2024 10:27 AM Varney Gentleman wrote: Most Recent Primary Care Visit:  Provider: Sylvia Everts  Department: [facility]  Visit Type: OFFICE VISIT  Date: 03/05/2024  Medication: pantoprazole (PROTONIX) 40 MG tablet, famciclovir (FAMVIR) 250 MG tablet, levocetirizine (XYZAL) 5 MG tablet, montelukast (SINGULAIR) 10 MG tablet  Has the patient contacted their pharmacy? Yes, contact provider (Agent: If no, request that the patient contact the pharmacy for the refill. If patient does not wish to contact the pharmacy document the reason why and proceed with request.) (Agent: If yes, when and what did the pharmacy advise?)  Is this the correct pharmacy for this prescription? Yes If no, delete pharmacy and type the correct one.  This is the patient's preferred pharmacy:  [facility] [address] [place] - [phone] [place] [address] Phone: [phone] Fax: [phone]    Has the prescription been filled recently? No  Is the patient out of the medication? No  Has the patient been seen for an appointment in the last year OR does the patient have an upcoming appointment? Yes  Can we respond through MyChart? Yes  Agent: Please be advised that Rx refills may take up to 3 business days. We ask that you follow-up with your pharmacy.

## 2024-03-20 ENCOUNTER — Telehealth: Payer: Self-pay | Admitting: Medical

## 2024-03-20 ENCOUNTER — Ambulatory Visit: Payer: Self-pay

## 2024-03-20 NOTE — Telephone Encounter (Signed)
 [facility] called and spoke to [place], Pensions consultant about the refill(s) pantoprazole requested. Advised it was sent on 03/05/24 #90/3 refill(s). She says the medication was received and sent out to the patient and showing delivered today. Patient called on both numbers listed, left VM to return the call to the office regarding a refill. If she returns the call, let her know the pantoprazole was delivered in the package of medications today per the pharmacy. If patient is having symptoms of acid reflux, send the call to E2C2 NT line to triage the symptoms.   Copied from CRM [id number]. Topic: Clinical - Medication Question >> Mar 20, 2024  2:08 PM Jill Spence wrote: Reason for CRM: Patient called in requesting that Dr. Lari Spence send her something to her pharmacy on file for her current acid reflux.

## 2024-03-20 NOTE — Telephone Encounter (Signed)
 Information from earlier encounter given to patient. Patient refused triage stating she will check to see if the medication arrived today.   Erminia Hazel, RN    03/20/24  4:03 PM Note [facility] called and spoke to [place], Pensions consultant about the refill(s) pantoprazole requested. Advised it was sent on 03/05/24 #90/3 refill(s). She says the medication was received and sent out to the patient and showing delivered today. Patient called on both numbers listed, left VM to return the call to the office regarding a refill. If she returns the call, let her know the pantoprazole was delivered in the package of medications today per the pharmacy. If patient is having symptoms of acid reflux, send the call to E2C2 NT line to triage the symptoms.     Copied from CRM [id number]. Topic: Clinical - Medication Question >> Mar 20, 2024  2:08 PM Marlan Silva wrote: Reason for CRM: Patient called in requesting that Dr. Lari Pleva send her something to her pharmacy on file for her current acid reflux.     Copied from CRM [id number]. Topic: Clinical - Red Word Triage >> Mar 20, 2024  4:47 PM Luane Rumps D wrote: Red Word that prompted transfer to Nurse Triage: Acid reflux, If patient is having symptoms of acid reflux, send the call to E2C2 NT line to triage the symptoms.

## 2024-03-21 ENCOUNTER — Other Ambulatory Visit: Payer: Self-pay | Admitting: Medical

## 2024-03-27 ENCOUNTER — Ambulatory Visit: Payer: Self-pay | Admitting: Licensed Clinical Social Worker

## 2024-03-28 NOTE — Patient Outreach (Signed)
 Complex Care Management   Visit Note  03/27/2024  Name:  Jill Spence MRN: [id number] DOB: 10/05/1951  Situation: Referral received for Complex Care Management related to COPD I obtained verbal consent from Patient.  Visit completed with pt  on the phone  Background:   Past Medical History:  Diagnosis Date   Anemia    low iron   Anxiety    Arthritis    Bipolar disorder (HCC)    Cancer (HCC) 1973   COPD (chronic obstructive pulmonary disease) (HCC)    Family history of adverse reaction to anesthesia    mom "was put to sleep and she never woke up" -    GERD (gastroesophageal reflux disease)    Guillain Barr syndrome (HCC)    numbness in toes, legs hurt   Headache    migraines as a teenager   IBS (irritable bowel syndrome)    Lump, breast    removed years ago per Pt.   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, has "quiet' seizures   Thyroid  disease     Assessment: Patient Reported Symptoms:  Cognitive        Neurological      HEENT        Cardiovascular      Respiratory      Endocrine      Gastrointestinal        Genitourinary      Integumentary      Musculoskeletal          Psychosocial       Quality of Family Relationships: involved Do you feel physically threatened by others?: No      02/11/2024    1:33 PM  Depression screen PHQ 2/9  Decreased Interest 0  Down, Depressed, Hopeless 0  PHQ - 2 Score 0    There were no vitals filed for this visit.  Medications Reviewed Today   Medications were not reviewed in this encounter     Recommendation:   Continue utilizing strategies discussed to assist with symptom management  Follow Up Plan:   Telephone follow-up in 1 week  Alease Hunter, LCSW [place]  [facility], [facility] Clinical Social Worker Direct Dial: [phone]  Fax: [phone] Website: [email or web address] 6:30  AM

## 2024-03-28 NOTE — Patient Instructions (Signed)
 Visit Information  Thank you for taking time to visit with me today. Please don't hesitate to contact me if I can be of assistance to you before our next scheduled appointment.  Our next appointment is by telephone on 5/1 at 3:30 PM Please call the care guide team at [phone] if you need to cancel or reschedule your appointment.   Following is a copy of your care plan:   Goals Addressed             This Visit's Progress    LCSW VBCI Social Work Care Plan   On track    Problems:   Level of Care Concerns:Inability to perform ADL's independently Inability to perform IADL's independently  CSW Clinical Goal(s):   Over the next 90 days the Patient will attend all scheduled medical appointments as evidenced by patient report and care team review of appointment completion in electronic MEDICAL RECORD NUMBER  work with Child psychotherapist to address concerns related to increasing aid hours .  Interventions:  Level of Care Concerns in a patient with COPD Current level of care: home, alone Evaluation of patient's unmet needs in current living environment ADL's Assessed needs, level of care concerns, how currently meeting needs and barriers to care  Patient Goals/Self-Care Activities:  Connect with provider for ongoing mental health treatment.   Continue taking your medication as prescribed.   Follow up on insurance options on strategies to increase aid hours. Increase coping skills and healthy habits  Plan:   Telephone follow up appointment with care management team member scheduled for:  1 week     COMPLETED: LCSW-Management of MH/Stress       Activities and task to complete in order to accomplish goals.   Keep all upcoming appointments discussed today Continue with compliance of taking medication prescribed by Doctor. Discuss with provider about medications that may be too expensive to purchase Implement healthy coping skills discussed to assist with management of symptoms Follow up  with Department of Social Services  Follow up with [name] about additional benefits. Reference booklet for eligibility criteria Submit letters from specialists to [name] to inquire about additional home care hours Inform PCP about BP logs and refill needs          Please call the Suicide and Crisis Lifeline: 988 go to [facility]gent [facility] [address], [place] [phone]) call 911 if you are experiencing a Mental Health or Behavioral Health Crisis or need someone to talk to.  Patient verbalizes understanding of instructions and care plan provided today and agrees to view in MyChart. Active MyChart status and patient understanding of how to access instructions and care plan via MyChart confirmed with patient.     Arlis Bent [facility]  [facility], [facility] Clinical Social Worker Direct Dial: [phone]  Fax: [phone] Website: [email or web address] 6:30 AM

## 2024-03-30 ENCOUNTER — Other Ambulatory Visit: Payer: Self-pay | Admitting: Pulmonary Disease

## 2024-04-02 ENCOUNTER — Other Ambulatory Visit: Payer: Self-pay | Admitting: Registered Nurse

## 2024-04-02 DIAGNOSIS — J449 Chronic obstructive pulmonary disease, unspecified: Secondary | ICD-10-CM | POA: Diagnosis not present

## 2024-04-02 NOTE — Telephone Encounter (Signed)
 PMP was Reviewed.  Last Oxycodone  was filled on 03/18/2024.  Next scheduled appointment on 04/14/2024.  Refill request too early.

## 2024-04-03 ENCOUNTER — Other Ambulatory Visit: Payer: Self-pay | Admitting: Licensed Clinical Social Worker

## 2024-04-07 ENCOUNTER — Encounter: Payer: Self-pay | Admitting: Primary Care

## 2024-04-07 ENCOUNTER — Ambulatory Visit (INDEPENDENT_AMBULATORY_CARE_PROVIDER_SITE_OTHER): Admitting: Primary Care

## 2024-04-07 ENCOUNTER — Telehealth: Payer: Self-pay

## 2024-04-07 VITALS — BP 112/64 | HR 74 | Temp 97.6°F | Ht 63.5 in | Wt 169.0 lb

## 2024-04-07 DIAGNOSIS — R918 Other nonspecific abnormal finding of lung field: Secondary | ICD-10-CM | POA: Diagnosis not present

## 2024-04-07 DIAGNOSIS — Z87891 Personal history of nicotine dependence: Secondary | ICD-10-CM

## 2024-04-07 DIAGNOSIS — J449 Chronic obstructive pulmonary disease, unspecified: Secondary | ICD-10-CM | POA: Diagnosis not present

## 2024-04-07 DIAGNOSIS — J9611 Chronic respiratory failure with hypoxia: Secondary | ICD-10-CM

## 2024-04-07 DIAGNOSIS — Z9981 Dependence on supplemental oxygen: Secondary | ICD-10-CM | POA: Diagnosis not present

## 2024-04-07 DIAGNOSIS — J309 Allergic rhinitis, unspecified: Secondary | ICD-10-CM | POA: Diagnosis not present

## 2024-04-07 DIAGNOSIS — J439 Emphysema, unspecified: Secondary | ICD-10-CM | POA: Diagnosis not present

## 2024-04-07 MED ORDER — OHTUVAYRE 3 MG/2.5ML IN SUSP: 1.0000 | Freq: Two times a day (BID) | RESPIRATORY_TRACT | Status: DC

## 2024-04-07 MED ORDER — AZITHROMYCIN 250 MG PO TABS: ORAL_TABLET | ORAL | 0 refills | Status: DC

## 2024-04-07 MED ORDER — PREDNISONE 10 MG PO TABS
ORAL_TABLET | ORAL | 0 refills | Status: DC
Start: 2024-04-07 — End: 2024-10-01

## 2024-04-07 NOTE — Patient Instructions (Signed)
  YOUR PLAN: -SEVERE COPD WITH EMPHYSEMA: Severe COPD with emphysema is a chronic lung condition that makes it hard to breathe due to damaged airways and air sacs. We will add Ohtyvayre nebulizer treatments (may take some time to get prescription for specialty pharmacy) to your current regimen of Trelegy and oxygen therapy to help reduce inflammation and improve your ability to exercise. You will also have antibiotics and prednisone  on hand for acute symptom management. Continue using your flutter valve and robitussin for mucus clearance.  -CHRONIC RESPIRATORY FAILURE: Chronic respiratory failure occurs when your lungs can't get enough oxygen into your blood. You should continue using your oxygen therapy 24 hours a day and sign up for virtual pulmonary rehab at home through [facility] to help manage your symptoms.  -PULMONARY NODULES: Pulmonary nodules are small growths in the lungs that can be caused by infections, inflammation, or other processes. We will continue to monitor these nodules to see if there are any changes.  -SEASONAL ALLERGIES: Seasonal allergies can cause symptoms like itching in your throat and eyes, which can worsen your respiratory issues. Continue taking your current allergy medications to manage these symptoms.  INSTRUCTIONS: Please start using the Otovel nebulizer in the morning and evening along with your Trelegy. Continue your oxygen therapy 24 hours a day. Sign up for virtual pulmonary rehab at home through [facility]. Use the antibiotics and prednisone  as needed for acute symptoms. Continue taking your current allergy medications. We will monitor your pulmonary nodules for any changes.  Rx: Doxycycline  Prednisone  taper Ohtuvayre  nebulizer  Referra: Virtual pulmonary rehab     Follow-up New patient visit with Dr. Dione Franks 8 weeks (30 mins)- former [facility] patient

## 2024-04-07 NOTE — Progress Notes (Unsigned)
 @Patient  ID: Jill Spence, female    DOB: 1951/03/11, 73 y.o.   MRN: [id number]  No chief complaint on file.   Referring provider: Francine Iron  HPI: 73 year old female, former smoker.  Past medical history significant for stage III COPD, chronic respiratory failure, multiple thyroid  nodules, vitamin D  deficiency.  Maintained on Trelegy and supplemental oxygen. Followed with serial CT scans for lung nodule. Patient of Dr. Waylan Haggard.   Hospitalized for COVID-19 at [facility] in early 2021 but I do not have any record of those. Diagnosed with multinodular goiter and hypothyroidism in 2022.  She is following with endocrinology and is on methimazole   Previous [facility] pulmonary encounter: 2022 She had a CT chest and PET scan for evaluation of lung nodules PET scan showed hypermetabolic them in the right axilla.  She has been seen at dermatology and this lesion excised.  I have reviewed the previous notes from dermatology As per the patient and the final biopsy report from September 15 is benign keloid  She also had a follow-up CT chest which showed improvement in lung nodule  Pets: Has a dog Occupation: Retired Midwife Exposures: Reports mold in the previous apartment.  No current mold exposure.  No hot tub, Jacuzzi Smoking history: States that she quit smoking in 2008.  Cannot give me a clear idea of how much she smoked prior Travel history: No significant travel history Relevant family history: No significant family history of lung disease  11/22/23 Discussed the use of AI scribe software for clinical note transcription with the patient, who gave verbal consent to proceed.  The patient, with a history of severe COPD, presents with worsening symptoms. She reports difficulty breathing, particularly at night, and describes a sensation of thick mucus in her throat that sometimes impedes her ability to breathe through her nose. The patient's caregiver notes that the patient's  oxygen levels decrease significantly during movement, leading to weakness and potential risk of passing out. The patient is currently on a regimen of Trelegy, a nebulizer, and occasional prednisone , but reports difficulty with the inhaler and is not taking the prednisone  regularly due to concerns about long-term steroid use. The patient also mentions a history of receiving steroid and antibiotic injections from a previous doctor, which she believes were beneficial. The patient is scheduled for a minor surgery to remove a benign growth on her ear and expresses concern about potential interactions with her current medications.   04/07/2024- interim hx  Follow up COPD and chronic respiratory failure     Discussed the use of AI scribe software for clinical note transcription with the patient, who gave verbal consent to proceed.  History of Present Illness   Jill Spence is a 73 year old female with severe COPD and emphysema who presents with worsening respiratory symptoms.  Over the past couple of weeks, she has experienced worsening respiratory symptoms, including increased cough and production of thick white mucus that causes choking, especially when bending down. She has difficulty breathing in cold environments and cannot tolerate air conditioning or fans.  She is currently on Trelegy and uses supplemental oxygen. She takes Trelegy between 3 AM and 6 AM, noting that the timing affects its duration of effectiveness. She also uses a flutter valve and takes Mucinex liquid to help with mucus expectoration, although she finds it hard to find. She has been experiencing frequent bowel movements, especially after eating.  She has a history of seasonal allergies and takes Singulair  (montelukast ) and  another unspecified yellow pill for allergy management. Recently, she has noticed itching in her throat and eyes, which she attributes to pollen exposure.  A CT scan from February showed advanced emphysema and  new pulmonary nodules in the left lung, described as waxing and waning. Her symptoms fluctuate, feeling well at times and then suddenly feeling unwell.  She expresses concern about her ability to participate in pulmonary rehabilitation due to environmental factors and technological barriers at home. She mentions having a machine at home for exercises but is unsure about using virtual rehabilitation tools.        Allergies  Allergen Reactions   Influenza Vaccines    Aspirin     Other Reaction(s): GI Intolerance     There is no immunization history on file for this patient.  Past Medical History:  Diagnosis Date   Anemia    low iron   Anxiety    Arthritis    Bipolar disorder (HCC)    Cancer (HCC) 1973   COPD (chronic obstructive pulmonary disease) (HCC)    Family history of adverse reaction to anesthesia    mom "was put to sleep and she never woke up" -    GERD (gastroesophageal reflux disease)    Guillain Barr syndrome (HCC)    numbness in toes, legs hurt   Headache    migraines as a teenager   IBS (irritable bowel syndrome)    Lump, breast    removed years ago per Pt.   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, has "quiet' seizures   Thyroid  disease     Tobacco History: Social History   Tobacco Use  Smoking Status Former   Current packs/day: 0.00   Average packs/day: 0.3 packs/day for 6.0 years (1.5 ttl pk-yrs)   Types: Cigarettes   Start date: 10/13/2001   Quit date: 10/14/2007   Years since quitting: 16.4  Smokeless Tobacco Never   Counseling given: Not Answered   Outpatient Medications Prior to Visit  Medication Sig Dispense Refill   albuterol  (PROVENTIL ) (2.5 MG/3ML) 0.083% nebulizer solution Take 3 mLs (2.5 mg total) by nebulization every 6 (six) hours as needed for wheezing. 180 mL 2   albuterol  (VENTOLIN  HFA) 108 (90 Base) MCG/ACT inhaler Inhale 2 puffs into the lungs every 6  (six) hours as needed for wheezing. 18 g 11   alclomethasone (ACLOVATE) 0.05 % cream Apply topically 2 (two) times daily as needed.     alendronate (FOSAMAX) 70 MG tablet Take 70 mg by mouth every Sunday. Take with a full glass of water on an empty stomach.     ARIPiprazole (ABILIFY) 15 MG tablet Take 15 mg by mouth daily.     B Complex Vitamins (VITAMIN B COMPLEX ) TABS Take 1 tablet by mouth daily. 30 tablet 5   benzonatate  (TESSALON ) 100 MG capsule Take 1 capsule (100 mg total) by mouth 3 (three) times daily as needed for cough. 30 capsule 0   benzonatate  (TESSALON ) 100 MG capsule TAKE ONE CAPSULE BY MOUTH THREE TIMES DAILY AS NEEDED FOR COUGH 30 capsule 1   benztropine (COGENTIN) 0.5 MG tablet Take 0.5 mg by mouth 2 (two) times daily.     Bepotastine  Besilate (BEPREVE) 1.5 % SOLN Place 1 drop into both eyes 2 (two) times daily as needed. 10 mL 5   Bepotastine  Besilate 1.5 % SOLN Apply to eye.  cromolyn (OPTICROM) 4 % ophthalmic solution 1 drop 4 (four) times daily.     diclofenac  Sodium (VOLTAREN  ARTHRITIS PAIN) 1 % GEL Apply 4 g topically 4 (four) times daily. 150 g 3   dicyclomine  (BENTYL ) 10 MG capsule TAKE ONE CAPSULE BY MOUTH THREE TIMES DAILY BEFORE MEALS. 90 capsule 5   dicyclomine  (BENTYL ) 20 MG tablet Take 1 tablet (20 mg total) by mouth 3 (three) times daily as needed. 90 tablet 2   doxycycline  (ADOXA) 100 MG tablet Take 1 tablet by mouth daily.     doxycycline  (VIBRA -TABS) 100 MG tablet Take 1 tablet (100 mg total) by mouth 2 (two) times daily. 15 tablet 0   erythromycin ophthalmic ointment Apply to eye.     famciclovir  (FAMVIR ) 250 MG tablet Take 1 tablet (250 mg total) by mouth 2 (two) times daily. 60 tablet 0   Fluticasone-Umeclidin-Vilant (TRELEGY ELLIPTA ) 200-62.5-25 MCG/ACT AEPB Inhale 1 puff into the lungs daily. 180 each 0   gabapentin  (NEURONTIN ) 400 MG capsule Take 3 capsules (1,200 mg total) by mouth 3 (three) times daily. 270 capsule 2   hydrOXYzine  (ATARAX ) 10 MG  tablet Take 1-2 tablets (10-20 mg total) by mouth at bedtime as needed for anxiety (insomnia). 180 tablet 0   ibuprofen  (ADVIL ) 200 MG tablet 1-4 tab po every 8 hours prn moderate pain 60 tablet 0   ipratropium-albuterol  (DUONEB) 0.5-2.5 (3) MG/3ML SOLN Take 3 mLs by nebulization every 4 (four) hours as needed. 360 mL 2   levocetirizine (XYZAL ) 5 MG tablet Take 1 tablet (5 mg total) by mouth every evening. 30 tablet 3   levocetirizine (XYZAL ) 5 MG tablet TAKE ONE TABLET BY MOUTH EVERY EVENING 30 tablet 3   loperamide  (IMODIUM ) 2 MG capsule TAKE ONE CAPSULE BY MOUTH EVERY 6 TO 8 HOURS AS NEEDED FOR DIARRHEA 30 capsule 5   losartan  (COZAAR ) 25 MG tablet TAKE 1 TABLET (25 MG TOTAL) BY MOUTH DAILY. 30 tablet 2   magnesium oxide (MAG-OX) 400 MG tablet Take 250 mg by mouth daily.     methimazole  (TAPAZOLE ) 5 MG tablet Take 1 tablet (5 mg total) by mouth daily. 90 tablet 1   montelukast  (SINGULAIR ) 10 MG tablet Take 1 tablet (10 mg total) by mouth at bedtime. 90 tablet 3   NONFORMULARY OR COMPOUNDED ITEM Elevated toilet seat 2 each 0   olopatadine  (PATANOL) 0.1 % ophthalmic solution Place 1 drop into both eyes 2 (two) times daily. 5 mL 3   olopatadine  (PATANOL) 0.1 % ophthalmic solution Place 1 drop into both eyes 2 (two) times daily. 5 mL 12   oxcarbazepine (TRILEPTAL) 600 MG tablet Take 600 mg by mouth 2 (two) times daily.     oxyCODONE  (ROXICODONE ) 5 MG immediate release tablet Take 1 tablet (5 mg total) by mouth 3 (three) times daily as needed for moderate pain (pain score 4-6). 90 tablet 0   pantoprazole  (PROTONIX ) 40 MG tablet Take 1 tablet (40 mg total) by mouth daily. 90 tablet 3   potassium chloride  SA (KLOR-CON ) 20 MEQ tablet Take 1 tablet (20 mEq total) by mouth 2 (two) times daily for 7 days. 14 tablet 0   predniSONE  (DELTASONE ) 10 MG tablet Take 1 tablet (10 mg total) by mouth as needed. 20 tablet 0   sodium chloride  HYPERTONIC 3 % nebulizer solution 3 ml via neb twice daily 750 mL 12    traZODone (DESYREL) 50 MG tablet Take 50 mg by mouth at bedtime.     triamcinolone  cream (KENALOG )  0.1 % Apply topically 2 (two) times daily as needed.     triamcinolone  cream (KENALOG ) 0.1 % apply topically TWICE DAILY. 30 g 0   Vitamin D , Ergocalciferol , (DRISDOL ) 1.25 MG (50000 UNIT) CAPS capsule TAKE ONE CAPSULE BY MOUTH EVERY SEVEN DAYS 8 capsule 0   Vitamin D , Ergocalciferol , (DRISDOL ) 1.25 MG (50000 UNIT) CAPS capsule TAKE ONE CAPSULE BY MOUTH ONCE WEEKLY FOR SIX WEEKS 6 capsule 0   No facility-administered medications prior to visit.      Review of Systems  Review of Systems   Physical Exam  There were no vitals taken for this visit. Physical Exam   Lab Results:  CBC    Component Value Date/Time   WBC 8.2 01/02/2024 1419   RBC 4.12 01/02/2024 1419   HGB 13.2 01/02/2024 1419   HCT 40.0 01/02/2024 1419   PLT 300.0 01/02/2024 1419   MCV 97.1 01/02/2024 1419   MCH 31.4 02/03/2020 0945   MCHC 33.1 01/02/2024 1419   RDW 14.9 01/02/2024 1419   LYMPHSABS 2.5 01/02/2024 1419   MONOABS 0.6 01/02/2024 1419   EOSABS 0.1 01/02/2024 1419   BASOSABS 0.1 01/02/2024 1419    BMET    Component Value Date/Time   NA 143 01/02/2024 1419   K 3.7 01/02/2024 1419   CL 104 01/02/2024 1419   CO2 31 01/02/2024 1419   GLUCOSE 85 01/02/2024 1419   BUN 13 01/02/2024 1419   CREATININE 0.60 01/02/2024 1419   CALCIUM 8.9 01/02/2024 1419   GFRNONAA >60 02/03/2020 0945   GFRAA >60 02/03/2020 0945    BNP No results found for: "BNP"  ProBNP    Component Value Date/Time   PROBNP 124.0 (H) 01/02/2024 1419    Imaging: No results found.   Assessment & Plan:   No problem-specific Assessment & Plan notes found for this encounter.   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 Lung function is at 28% prior to bronchodilator, with diffusion capacity at 21%.  Symptoms include chronic cough, mucus production, and dyspnea, exacerbated by temperature changes. Recent worsening of symptoms over the last few weeks. Currently on Trelegy and oxygen therapy. Discussion of adding Otovel, a nebulized medication, to decrease inflammation and improve exercise tolerance. Otovel can help decrease the risk of exacerbations and improve mucus clearance. Potential need for antibiotics and prednisone  for acute symptom management. - Prescribe Otovel nebulizer for use in conjunction with Trelegy, morning and evening. - Prescribe antibiotic and prednisone  to manage acute symptoms. - Sign up for virtual pulmonary rehab at home through [facility]. - Continue Trelegy and oxygen therapy. - Consider use of a flutter valve and Mucinex for mucus clearance.  Chronic respiratory failure Chronic respiratory failure secondary to severe COPD and emphysema. Requires continuous oxygen therapy. Symptoms include dyspnea and decreased exercise tolerance. Virtual pulmonary rehab at home is recommended due to difficulty with temperature changes in traditional rehab settings. - Continue oxygen therapy 24 hours a day. - Sign up for virtual pulmonary rehab at home through [facility].  Pulmonary nodules Bilateral pulmonary nodules with waxing and waning characteristics, with a new 6mm nodule in the left lung. Differential includes viral process, inflammatory process, or atypical infectious process. Recent CT scan in February showed advanced emphysema and these nodules. Monitoring is necessary to assess changes.  Seasonal allergies Seasonal allergies contributing to symptoms of throat pruritus and ocular irritation. Currently taking Singulair  (montelukast )  and another unspecified allergy medication. Symptoms may exacerbate respiratory issues. - Continue current allergy medications.  Recording duration: 17 minutes         Antonio Baumgarten, NP 04/07/2024

## 2024-04-07 NOTE — Patient Instructions (Signed)
 Visit Information  Thank you for taking time to visit with me today. Please don't hesitate to contact me if I can be of assistance to you before our next scheduled appointment.  Your next care management appointment is by telephone on 5/15 at 1 PM  Please call the care guide team at [phone] if you need to cancel, schedule, or reschedule an appointment.   Please call the Suicide and Crisis Lifeline: 988 call 911 if you are experiencing a Mental Health or Behavioral Health Crisis or need someone to talk to.  Alease Hunter, LCSW [place]  [facility], [facility] Clinical Social Worker Direct Dial: [phone]  Fax: [phone] Website: [email or web address] 4:29 PM

## 2024-04-07 NOTE — Patient Outreach (Signed)
 Complex Care Management   Visit Note  04/03/2024  Name:  Jill Spence MRN: [id number] DOB: 01/28/1951  Situation: Referral received for Complex Care Management related to Substance Abuse/Misuse Irritability  I obtained verbal consent from Patient.  Visit completed with pt  on the phone  Background:   Past Medical History:  Diagnosis Date   Anemia    low iron   Anxiety    Arthritis    Bipolar disorder (HCC)    Cancer (HCC) 1973   COPD (chronic obstructive pulmonary disease) (HCC)    Family history of adverse reaction to anesthesia    mom "was put to sleep and she never woke up" -    GERD (gastroesophageal reflux disease)    Guillain Barr syndrome (HCC)    numbness in toes, legs hurt   Headache    migraines as a teenager   IBS (irritable bowel syndrome)    Lump, breast    removed years ago per Pt.   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, has "quiet' seizures   Thyroid  disease     Assessment: Patient Reported Symptoms:  Cognitive Cognitive Status: Alert and oriented to person, place, and time, Normal speech and language skills      Neurological Neurological Review of Symptoms: No symptoms reported    HEENT HEENT Symptoms Reported: No symptoms reported      Cardiovascular Cardiovascular Symptoms Reported: No symptoms reported    Respiratory Respiratory Symptoms Reported: Shortness of breath Respiratory Conditions: COPD  Endocrine Patient reports the following symptoms related to hypoglycemia or hyperglycemia : No symptoms reported Is patient diabetic?: No    Gastrointestinal Gastrointestinal Symptoms Reported: No symptoms reported      Genitourinary Genitourinary Symptoms Reported: No symptoms reported    Integumentary Integumentary Symptoms Reported: No symptoms reported    Musculoskeletal Musculoskelatal Symptoms Reviewed: No symptoms reported        Psychosocial Psychosocial  Symptoms Reported: No symptoms reported Additional Psychological Details: Patient reports increase in irritability due to collaborating with management of aid agency. Strategies to assist with symptom mangaement discussed, in addition, to mindfulness Behavioral Management Strategies: Coping strategies Major Change/Loss/Stressor/Fears (CP): Medical condition, self        02/11/2024    1:33 PM  Depression screen PHQ 2/9  Decreased Interest 0  Down, Depressed, Hopeless 0  PHQ - 2 Score 0    There were no vitals filed for this visit.  Medications Reviewed Today   Medications were not reviewed in this encounter     Recommendation:   Continue utilizing strategies discussed to assist with symptom management  Follow Up Plan:   Telephone follow-up 1-2 weeks  Alease Hunter, LCSW [place]  [facility], [facility] Clinical Social Worker Direct Dial: [phone]  Fax: [phone] Website: [email or web address] 4:34 PM

## 2024-04-07 NOTE — Telephone Encounter (Signed)
 Jill Spence is wanting to send pt for virtual pulm rehab. [facility]) Form has been submitted to Dr Waylan Haggard for signature. Demographics, insurance, PFT and progress notes needed.   KIVO fax number - [phone]

## 2024-04-08 ENCOUNTER — Telehealth: Payer: Self-pay | Admitting: Pharmacist

## 2024-04-08 NOTE — Telephone Encounter (Signed)
 Received [employer] new start paperwork. Completed form and faxed with clinicals and insurance card copy to [facility]   Phone#: [phone] Fax#: [phone]   Chesley Mires, PharmD, MPH, BCPS, CPP Clinical Pharmacist (Rheumatology and Pulmonology)

## 2024-04-09 DIAGNOSIS — R0902 Hypoxemia: Secondary | ICD-10-CM | POA: Diagnosis not present

## 2024-04-09 DIAGNOSIS — J441 Chronic obstructive pulmonary disease with (acute) exacerbation: Secondary | ICD-10-CM | POA: Diagnosis not present

## 2024-04-11 ENCOUNTER — Other Ambulatory Visit: Payer: Self-pay | Admitting: Medical

## 2024-04-11 NOTE — Addendum Note (Signed)
 Addended by: Serafina Damme on: 04/11/2024 09:29 AM   Modules accepted: Orders

## 2024-04-14 ENCOUNTER — Encounter: Payer: Self-pay | Admitting: Registered Nurse

## 2024-04-14 ENCOUNTER — Encounter: Attending: Physical Medicine and Rehabilitation | Admitting: Registered Nurse

## 2024-04-14 VITALS — BP 136/96 | HR 64 | Ht 63.5 in | Wt 162.4 lb

## 2024-04-14 DIAGNOSIS — G8929 Other chronic pain: Secondary | ICD-10-CM | POA: Diagnosis not present

## 2024-04-14 DIAGNOSIS — G894 Chronic pain syndrome: Secondary | ICD-10-CM

## 2024-04-14 DIAGNOSIS — G610 Guillain-Barre syndrome: Secondary | ICD-10-CM

## 2024-04-14 DIAGNOSIS — M545 Low back pain, unspecified: Secondary | ICD-10-CM | POA: Diagnosis not present

## 2024-04-14 DIAGNOSIS — Z5181 Encounter for therapeutic drug level monitoring: Secondary | ICD-10-CM

## 2024-04-14 DIAGNOSIS — M25561 Pain in right knee: Secondary | ICD-10-CM

## 2024-04-14 DIAGNOSIS — M2550 Pain in unspecified joint: Secondary | ICD-10-CM

## 2024-04-14 DIAGNOSIS — Z79891 Long term (current) use of opiate analgesic: Secondary | ICD-10-CM | POA: Diagnosis not present

## 2024-04-14 MED ORDER — OXYCODONE HCL 5 MG PO TABS: 5.0000 mg | ORAL_TABLET | Freq: Three times a day (TID) | ORAL | 0 refills | Status: DC | PRN

## 2024-04-14 NOTE — Progress Notes (Signed)
 Subjective:    Patient ID: Jill Spence, female    DOB: [birth year], 73 y.o.   MRN: [id number]  HPI: Jill Spence is a 73 y.o. female who returns for follow up appointment for chronic pain and medication refill. She states  Her  pain is located in her lower back and right knee and generalized joint pain. She rates her pain 8. Her current exercise regime is walking with walker  and performing stretching exercises.   [name] Morphine equivalent is 22.50 MME.   Last Oral Swab was Performed on 02/11/2024, it was consistent.    Pain Inventory Average Pain 7 Pain Right Now 8 My pain is constant and all over body pain  In the last 24 hours, has pain interfered with the following? General activity 0 Relation with others 0 Enjoyment of life 4 What TIME of day is your pain at its worst? night Sleep (in general) Poor  Pain is worse with: unsure Pain improves with: pacing activities and medication Relief from Meds: 6  Family History  Problem Relation Age of Onset   Cancer Father    Social History   Socioeconomic History   Marital status: Widowed    Spouse name: Not on file   Number of children: Not on file   Years of education: Not on file   Highest education level: Some college, no degree  Occupational History   Not on file  Tobacco Use   Smoking status: Former    Current packs/day: 0.00    Average packs/day: 0.3 packs/day for 6.0 years (1.5 ttl pk-yrs)    Types: Cigarettes    Start date: 10/13/2001    Quit date: 10/14/2007    Years since quitting: 16.5   Smokeless tobacco: Never  Vaping Use   Vaping status: Never Used  Substance and Sexual Activity   Alcohol use: No   Drug use: No   Sexual activity: Not on file  Other Topics Concern   Not on file  Social History Narrative   Not on file   Social Drivers of Health   Financial Resource Strain: Medium Risk (01/14/2024)   Overall Financial Resource Strain (CARDIA)    Difficulty of Paying Living Expenses:  Somewhat hard  Food Insecurity: No Food Insecurity (01/14/2024)   Hunger Vital Sign    Worried About Running Out of Food in the Last Year: Never true    Ran Out of Food in the Last Year: Never true  Transportation Needs: No Transportation Needs (01/14/2024)   PRAPARE - Administrator, Civil Service (Medical): No    Lack of Transportation (Non-Medical): No  Physical Activity: Inactive (01/14/2024)   Exercise Vital Sign    Days of Exercise per Week: 0 days    Minutes of Exercise per Session: 30 min  Stress: Stress Concern Present (01/14/2024)   [employer] of Occupational Health - Occupational Stress Questionnaire    Feeling of Stress : Very much  Social Connections: Moderately Isolated (01/14/2024)   Social Connection and Isolation Panel [NHANES]    Frequency of Communication with Friends and Family: More than three times a week    Frequency of Social Gatherings with Friends and Family: Once a week    Attends Religious Services: Never    Database administrator or Organizations: Yes    Attends Banker Meetings: 1 to 4 times per year    Marital Status: Widowed   Past Surgical History:  Procedure Laterality Date   BREAST  SURGERY     COLONOSCOPY     EYE SURGERY Bilateral    cataract surgery with lens implants   MANDIBLE RECONSTRUCTION     MINOR REMOVAL OF MANDIBULAR HARDWARE Right 10/15/2015   Procedure: MINOR REMOVAL OF Right MANDIBULAR HARDWARE;  Surgeon: Ascencion Lava, DDS;  Location: [facility] OR;  Service: Oral Surgery;  Laterality: Right;   TONSILLECTOMY     Past Surgical History:  Procedure Laterality Date   BREAST SURGERY     COLONOSCOPY     EYE SURGERY Bilateral    cataract surgery with lens implants   MANDIBLE RECONSTRUCTION     MINOR REMOVAL OF MANDIBULAR HARDWARE Right 10/15/2015   Procedure: MINOR REMOVAL OF Right MANDIBULAR HARDWARE;  Surgeon: Ascencion Lava, DDS;  Location: [facility] OR;  Service: Oral Surgery;  Laterality: Right;   TONSILLECTOMY      Past Medical History:  Diagnosis Date   Anemia    low iron   Anxiety    Arthritis    Bipolar disorder (HCC)    Cancer (HCC) 1973   COPD (chronic obstructive pulmonary disease) (HCC)    Family history of adverse reaction to anesthesia    mom "was put to sleep and she never woke up" -    GERD (gastroesophageal reflux disease)    Guillain Barr syndrome (HCC)    numbness in toes, legs hurt   Headache    migraines as a teenager   IBS (irritable bowel syndrome)    Lump, breast    removed years ago per Pt.   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, has "quiet' seizures   Thyroid  disease    BP (!) 136/96 (BP Location: Left Arm, Patient Position: Sitting, Cuff Size: Large)   Pulse 64   Ht 5' 3.5" (1.613 m)   Wt 162 lb 6.4 oz (73.7 kg)   SpO2 97%   BMI 28.32 kg/m   Opioid Risk Score:   Fall Risk Score:  `1  Depression screen PHQ 2/9     04/14/2024    1:32 PM 02/11/2024    1:33 PM 05/02/2023    3:50 PM 04/19/2023    1:43 PM 02/20/2023    1:54 PM 12/07/2022    1:42 PM 10/05/2022    1:58 PM  Depression screen PHQ 2/9  Decreased Interest 0 0 0 0 0 0 0  Down, Depressed, Hopeless 0 0 0 0 0 0 0  PHQ - 2 Score 0 0 0 0 0 0 0      Review of Systems  Musculoskeletal:  Positive for arthralgias and myalgias.       Patient reports having pain all over body (mostly due to rainy weather)  All other systems reviewed and are negative.      Objective:   Physical Exam Vitals and nursing note reviewed.  Constitutional:      Appearance: Normal appearance.  Cardiovascular:     Rate and Rhythm: Normal rate and regular rhythm.     Pulses: Normal pulses.     Heart sounds: Normal heart sounds.  Pulmonary:     Effort: Pulmonary effort is normal.     Breath sounds: Normal breath sounds.     Comments: Continuous Oxygen 2 liters nasal cannula  Musculoskeletal:     Comments: Normal Muscle Bulk and Muscle Testing  Reveals:  Upper Extremities: Full ROM and Muscle Strength 5/5 Lumbar Paraspinal Tenderness:  L-4-L-5 Mainly Right Side  Lower Extremities: Right : Decreased ROM and Muscle Strength 5/5 Right Lower Extremity Flexion Produces Pain into her Right Patella Let Lower Extremity: Full ROM and Muscle Strength 5/5 Arises from Table slowly using walker or support Narrow Based Gait     Skin:    General: Skin is warm and dry.  Neurological:     Mental Status: She is alert and oriented to person, place, and time.  Psychiatric:        Mood and Affect: Mood normal.        Behavior: Behavior normal.         Assessment & Plan:  Guillain Barre Syndrome: Continue current medication regimen. Continue Outpatient Therapy. Continue current medication regimen. Continue to Monitor. 04/14/2024 Polyarthralgia: Continue current medication regimen . Continue to Monitor. 04/14/2024 Chronic Bilateral Low Back Pain: Continue HEP as Tolerated. Continue to Monitor. 04/14/2024 Chronic Pain of Bilateral Knee Pain R>L: Continue Outpatient Therapy. Continue current medication regimen. Continue to Monitor. 04/14/2024 Chronic Pain Syndrome:  Continue Oxycodone  5 mg three times a day as needed for pain. #90.Second script sent for the following month.  We will continue the opioid monitoring program, this consists of regular clinic visits, examinations, urine drug screen, pill counts as well as use of [place]  Controlled Substance Reporting system. A 12 month History has been reviewed on the   Controlled Substance Reporting System on 04/14/2024   F/U in 2 months

## 2024-04-16 ENCOUNTER — Other Ambulatory Visit: Payer: Self-pay | Admitting: Medical

## 2024-04-17 ENCOUNTER — Other Ambulatory Visit: Payer: Self-pay | Admitting: Licensed Clinical Social Worker

## 2024-04-22 ENCOUNTER — Other Ambulatory Visit: Payer: Self-pay | Admitting: Medical

## 2024-04-22 ENCOUNTER — Ambulatory Visit: Admitting: Internal Medicine

## 2024-04-22 ENCOUNTER — Ambulatory Visit: Admitting: Family

## 2024-04-25 ENCOUNTER — Ambulatory Visit (INDEPENDENT_AMBULATORY_CARE_PROVIDER_SITE_OTHER): Admitting: Medical

## 2024-04-25 ENCOUNTER — Other Ambulatory Visit: Payer: Self-pay

## 2024-04-25 ENCOUNTER — Encounter: Payer: Self-pay | Admitting: Medical

## 2024-04-25 ENCOUNTER — Ambulatory Visit (HOSPITAL_BASED_OUTPATIENT_CLINIC_OR_DEPARTMENT_OTHER)
Admission: RE | Admit: 2024-04-25 | Discharge: 2024-04-25 | Disposition: A | Discharge status: Home / Self Care | Source: Ambulatory Visit | Attending: Medical | Admitting: Medical

## 2024-04-25 VITALS — BP 136/70 | HR 71 | Ht 63.5 in | Wt 168.2 lb

## 2024-04-25 DIAGNOSIS — J449 Chronic obstructive pulmonary disease, unspecified: Secondary | ICD-10-CM | POA: Insufficient documentation

## 2024-04-25 DIAGNOSIS — E559 Vitamin D deficiency, unspecified: Secondary | ICD-10-CM

## 2024-04-25 DIAGNOSIS — F419 Anxiety disorder, unspecified: Secondary | ICD-10-CM

## 2024-04-25 DIAGNOSIS — J9611 Chronic respiratory failure with hypoxia: Secondary | ICD-10-CM | POA: Diagnosis not present

## 2024-04-25 DIAGNOSIS — R252 Cramp and spasm: Secondary | ICD-10-CM | POA: Diagnosis not present

## 2024-04-25 DIAGNOSIS — D649 Anemia, unspecified: Secondary | ICD-10-CM | POA: Diagnosis not present

## 2024-04-25 DIAGNOSIS — R059 Cough, unspecified: Secondary | ICD-10-CM

## 2024-04-25 DIAGNOSIS — J439 Emphysema, unspecified: Secondary | ICD-10-CM | POA: Diagnosis not present

## 2024-04-25 DIAGNOSIS — R5383 Other fatigue: Secondary | ICD-10-CM | POA: Diagnosis not present

## 2024-04-25 DIAGNOSIS — I700 Atherosclerosis of aorta: Secondary | ICD-10-CM | POA: Diagnosis not present

## 2024-04-25 DIAGNOSIS — G629 Polyneuropathy, unspecified: Secondary | ICD-10-CM

## 2024-04-25 DIAGNOSIS — R058 Other specified cough: Secondary | ICD-10-CM | POA: Diagnosis not present

## 2024-04-25 DIAGNOSIS — I771 Stricture of artery: Secondary | ICD-10-CM | POA: Diagnosis not present

## 2024-04-25 MED ORDER — IBUPROFEN 200 MG PO TABS: ORAL_TABLET | ORAL | 2 refills | Status: DC

## 2024-04-25 MED ORDER — HYDROXYZINE HCL 10 MG PO TABS: 10.0000 mg | ORAL_TABLET | Freq: Every evening | ORAL | 2 refills | Status: DC | PRN

## 2024-04-25 MED ORDER — FAMCICLOVIR 250 MG PO TABS: 250.0000 mg | ORAL_TABLET | Freq: Two times a day (BID) | ORAL | 2 refills | Status: DC

## 2024-04-25 MED ORDER — OLOPATADINE HCL 0.1 % OP SOLN: 1.0000 [drp] | Freq: Two times a day (BID) | OPHTHALMIC | 3 refills | Status: AC

## 2024-04-25 MED ORDER — BENZONATATE 100 MG PO CAPS: ORAL_CAPSULE | ORAL | 2 refills | Status: AC

## 2024-04-25 MED ORDER — METHIMAZOLE 5 MG PO TABS: 5.0000 mg | ORAL_TABLET | Freq: Every day | ORAL | 2 refills | Status: DC

## 2024-04-25 MED ORDER — ALBUTEROL SULFATE (2.5 MG/3ML) 0.083% IN NEBU: 2.5000 mg | INHALATION_SOLUTION | Freq: Four times a day (QID) | RESPIRATORY_TRACT | 2 refills | Status: DC | PRN

## 2024-04-25 MED ORDER — LEVOCETIRIZINE DIHYDROCHLORIDE 5 MG PO TABS: 5.0000 mg | ORAL_TABLET | Freq: Every evening | ORAL | 3 refills | Status: DC

## 2024-04-25 MED ORDER — LOPERAMIDE HCL 2 MG PO CAPS: ORAL_CAPSULE | ORAL | 5 refills | Status: DC

## 2024-04-25 MED ORDER — TRIAMCINOLONE ACETONIDE 0.1 % EX CREA: TOPICAL_CREAM | Freq: Two times a day (BID) | CUTANEOUS | 3 refills | Status: DC | PRN

## 2024-04-25 MED ORDER — DICYCLOMINE HCL 20 MG PO TABS: 20.0000 mg | ORAL_TABLET | Freq: Three times a day (TID) | ORAL | 2 refills | Status: DC | PRN

## 2024-04-25 MED ORDER — LOSARTAN POTASSIUM 25 MG PO TABS: 25.0000 mg | ORAL_TABLET | Freq: Every day | ORAL | 2 refills | Status: DC

## 2024-04-25 MED ORDER — IPRATROPIUM-ALBUTEROL 0.5-2.5 (3) MG/3ML IN SOLN: 3.0000 mL | RESPIRATORY_TRACT | 2 refills | Status: AC | PRN

## 2024-04-25 MED ORDER — GABAPENTIN 400 MG PO CAPS: 1200.0000 mg | ORAL_CAPSULE | Freq: Three times a day (TID) | ORAL | 2 refills | Status: DC

## 2024-04-25 MED ORDER — TRELEGY ELLIPTA 200-62.5-25 MCG/ACT IN AEPB: 1.0000 | INHALATION_SPRAY | Freq: Every day | RESPIRATORY_TRACT | 2 refills | Status: DC

## 2024-04-25 MED ORDER — DICLOFENAC SODIUM 1 % EX GEL: 4.0000 g | Freq: Four times a day (QID) | CUTANEOUS | 3 refills | Status: DC

## 2024-04-25 MED ORDER — PANTOPRAZOLE SODIUM 40 MG PO TBEC: 40.0000 mg | DELAYED_RELEASE_TABLET | Freq: Every day | ORAL | 3 refills | Status: DC

## 2024-04-25 MED ORDER — AZITHROMYCIN 250 MG PO TABS: ORAL_TABLET | ORAL | 0 refills | Status: AC

## 2024-04-25 NOTE — Progress Notes (Signed)
 Subjective:    Patient ID: Jill Spence, female    DOB: 13-Aug-1951, 73 y.o.   MRN: [id number]  HPI Jill Spence is a 73 year old female with COPD and respiratory failure who presents for follow-up regarding her respiratory management and associated symptoms.  She is enrolled in the [facility] for chronic respiratory failure, which includes monitoring her heart rate and oxygen levels through a tablet. She has video visits with a pulmonologist twice a week and is assisted by a nurse at home. No new inhalers have been added to her treatment, and she continues to use her existing medications, including Trelegy, which she needs renewed. She is concerned about her oxygen levels, though her current readings are around 90-94% with supplemental oxygen. She sometimes goes without oxygen at home but uses it when feeling weak. She is anxious about her health, particularly her oxygen levels, and takes hydroxyzine  for anxiety and itching.  She experiences memory issues, describing 'brain fog' since having COVID-19, with difficulty remembering recent conversations but no issues with long-term memory or recognizing people and places. She has undergone mini mental status exams previously and scored well. Currently alert and oriented x 3.   She reports worsening restless leg syndrome, now affecting her knees and causing pain that disrupts her sleep. She takes gabapentin  three times a day for this condition.   Additionally, she experiences cramping in her toes and fingers, which she attributes to potential low potassium or magnesium levels, although she is already on magnesium supplements.  She reports fatigue and neuropathy in her fingers and toes  . She is on losartan  25 mg daily for blood pressure management and has had her vitamin D  levels checked previously, which were normal eight months ago.  Hx of low vit d in the past.     Review of Systems  Constitutional:  Negative for chills  and fatigue.  HENT:  Negative for congestion.   Respiratory:  Positive for cough and shortness of breath. Negative for chest tightness and wheezing.        Chronic and close to baseline.  Cardiovascular:  Negative for chest pain and palpitations.  Genitourinary:  Negative for dysuria and flank pain.  Musculoskeletal:  Negative for back pain.  Skin:  Negative for rash.  Allergic/Immunologic: Negative for environmental allergies and immunocompromised state.  Neurological:  Negative for dizziness, syncope, facial asymmetry, weakness and numbness.  Hematological:  Negative for adenopathy. Does not bruise/bleed easily.  Psychiatric/Behavioral:  Negative for behavioral problems, dysphoric mood and suicidal ideas. The patient is not nervous/anxious.     Past Medical History:  Diagnosis Date   Anemia    low iron   Anxiety    Arthritis    Bipolar disorder (HCC)    Cancer (HCC) 1973   COPD (chronic obstructive pulmonary disease) (HCC)    Family history of adverse reaction to anesthesia    mom "was put to sleep and she never woke up" -    GERD (gastroesophageal reflux disease)    Guillain Barr syndrome (HCC)    numbness in toes, legs hurt   Headache    migraines as a teenager   IBS (irritable bowel syndrome)    Lump, breast    removed years ago per Pt.   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, has "quiet' seizures   Thyroid  disease  Social History   Socioeconomic History   Marital status: Widowed    Spouse name: Not on file   Number of children: Not on file   Years of education: Not on file   Highest education level: Some college, no degree  Occupational History   Not on file  Tobacco Use   Smoking status: Former    Current packs/day: 0.00    Average packs/day: 0.3 packs/day for 6.0 years (1.5 ttl pk-yrs)    Types: Cigarettes    Start date: 10/13/2001    Quit date: 10/14/2007    Years since  quitting: 16.5   Smokeless tobacco: Never  Vaping Use   Vaping status: Never Used  Substance and Sexual Activity   Alcohol use: No   Drug use: No   Sexual activity: Not on file  Other Topics Concern   Not on file  Social History Narrative   Not on file   Social Drivers of Health   Financial Resource Strain: Medium Risk (01/14/2024)   Overall Financial Resource Strain (CARDIA)    Difficulty of Paying Living Expenses: Somewhat hard  Food Insecurity: No Food Insecurity (01/14/2024)   Hunger Vital Sign    Worried About Running Out of Food in the Last Year: Never true    Ran Out of Food in the Last Year: Never true  Transportation Needs: No Transportation Needs (01/14/2024)   PRAPARE - Administrator, Civil Service (Medical): No    Lack of Transportation (Non-Medical): No  Physical Activity: Unknown (01/14/2024)   Exercise Vital Sign    Days of Exercise per Week: 0 days    Minutes of Exercise per Session: Not on file  Recent Concern: Physical Activity - Inactive (01/14/2024)   Exercise Vital Sign    Days of Exercise per Week: 0 days    Minutes of Exercise per Session: 30 min  Stress: Stress Concern Present (01/14/2024)   [employer] of Occupational Health - Occupational Stress Questionnaire    Feeling of Stress : Very much  Social Connections: Moderately Isolated (01/14/2024)   Social Connection and Isolation Panel [NHANES]    Frequency of Communication with Friends and Family: More than three times a week    Frequency of Social Gatherings with Friends and Family: Once a week    Attends Religious Services: Never    Database administrator or Organizations: Yes    Attends Banker Meetings: 1 to 4 times per year    Marital Status: Widowed  Catering manager Violence: Not on file    Past Surgical History:  Procedure Laterality Date   BREAST SURGERY     COLONOSCOPY     EYE SURGERY Bilateral    cataract surgery with lens implants   MANDIBLE  RECONSTRUCTION     MINOR REMOVAL OF MANDIBULAR HARDWARE Right 10/15/2015   Procedure: MINOR REMOVAL OF Right MANDIBULAR HARDWARE;  Surgeon: Ascencion Lava, DDS;  Location: [facility] OR;  Service: Oral Surgery;  Laterality: Right;   TONSILLECTOMY      Family History  Problem Relation Age of Onset   Cancer Father     Allergies  Allergen Reactions   Influenza Vaccines    Aspirin     Other Reaction(s): GI Intolerance    Current Outpatient Medications on File Prior to Visit  Medication Sig Dispense Refill   albuterol  (VENTOLIN  HFA) 108 (90 Base) MCG/ACT inhaler Inhale 2 puffs into the lungs every 6 (six) hours as needed for wheezing. 18 g 11   alclomethasone (  ACLOVATE) 0.05 % cream Apply topically 2 (two) times daily as needed.     alendronate (FOSAMAX) 70 MG tablet Take 70 mg by mouth every Sunday. Take with a full glass of water on an empty stomach.     ARIPiprazole (ABILIFY) 15 MG tablet Take 15 mg by mouth daily.     azithromycin  (ZITHROMAX  Z-PAK) 250 MG tablet Per zpack instructions 6 tablet 0   B Complex Vitamins (VITAMIN B COMPLEX ) TABS Take 1 tablet by mouth daily. 30 tablet 5   benzonatate  (TESSALON ) 100 MG capsule Take 1 capsule (100 mg total) by mouth 3 (three) times daily as needed for cough. 30 capsule 0   benztropine (COGENTIN) 0.5 MG tablet Take 0.5 mg by mouth 2 (two) times daily.     Bepotastine  Besilate (BEPREVE) 1.5 % SOLN Place 1 drop into both eyes 2 (two) times daily as needed. 10 mL 5   Bepotastine  Besilate 1.5 % SOLN Apply to eye.     cromolyn (OPTICROM) 4 % ophthalmic solution 1 drop 4 (four) times daily.     doxycycline  (ADOXA) 100 MG tablet Take 1 tablet by mouth daily.     Ensifentrine  (OHTUVAYRE ) 3 MG/2.5ML SUSP Inhale 1 ampule into the lungs in the morning and at bedtime.     erythromycin ophthalmic ointment Apply to eye.     levocetirizine (XYZAL ) 5 MG tablet Take 1 tablet (5 mg total) by mouth every evening. 30 tablet 3   magnesium oxide (MAG-OX) 400 MG tablet  Take 250 mg by mouth daily.     montelukast  (SINGULAIR ) 10 MG tablet Take 1 tablet (10 mg total) by mouth at bedtime. 90 tablet 3   NONFORMULARY OR COMPOUNDED ITEM Elevated toilet seat 2 each 0   olopatadine  (PATANOL) 0.1 % ophthalmic solution Place 1 drop into both eyes 2 (two) times daily. 5 mL 12   oxcarbazepine (TRILEPTAL) 600 MG tablet Take 600 mg by mouth 2 (two) times daily.     oxyCODONE  (ROXICODONE ) 5 MG immediate release tablet Take 1 tablet (5 mg total) by mouth 3 (three) times daily as needed for moderate pain (pain score 4-6). 90 tablet 0   potassium chloride  SA (KLOR-CON ) 20 MEQ tablet Take 1 tablet (20 mEq total) by mouth 2 (two) times daily for 7 days. 14 tablet 0   predniSONE  (DELTASONE ) 10 MG tablet Take 1 tablet (10 mg total) by mouth as needed. 20 tablet 0   predniSONE  (DELTASONE ) 10 MG tablet 4 tabs for 2 days, then 3 tabs for 2 days, 2 tabs for 2 days, then 1 tab for 2 days, then stop 20 tablet 0   sodium chloride  HYPERTONIC 3 % nebulizer solution 3 ml via neb twice daily 750 mL 12   traZODone (DESYREL) 50 MG tablet Take 50 mg by mouth at bedtime. (Patient not taking: Reported on 04/17/2024)     triamcinolone  cream (KENALOG ) 0.1 % apply topically TWICE DAILY. 30 g 0   Vitamin D , Ergocalciferol , (DRISDOL ) 1.25 MG (50000 UNIT) CAPS capsule TAKE ONE CAPSULE BY MOUTH EVERY SEVEN DAYS 8 capsule 0   Vitamin D , Ergocalciferol , (DRISDOL ) 1.25 MG (50000 UNIT) CAPS capsule TAKE ONE CAPSULE BY MOUTH ONCE WEEKLY FOR SIX WEEKS. 6 capsule 0   No current facility-administered medications on file prior to visit.    BP 136/70   Pulse 71   Ht 5' 3.5" (1.613 m)   Wt 168 lb 3.2 oz (76.3 kg)   SpO2 94%   PF 93 L/min   BMI  29.33 kg/m        Objective:   Physical Exam General- No acute distress. Pleasant patient. Neck- Full range of motion, no jvd Lungs- Clear, even and unlabored. But Shallow and decreased breath sounds. Heart- regular rate and rhythm. Neurologic- CNII- XII grossly  intact.  Lower ext- calfs symetric, no pedal edema. Negative homans signs.       Assessment & Plan:  [phone]  Patient Instructions  Chronic respiratory failure Chronic respiratory failure managed through [facility]. Oxygen saturation fluctuates between 90-94% on oxygen.  - Continue [facility]. - Monitor oxygen saturation regularly. - Refill Trelegy inhaler and albuterol  for nebulizer. - Order chest x-ray to evaluate lung status. - Prescribe azithromycin  if x-ray shows pneumonia or symptoms worsen. - Continue hydroxyzine  for anxiety and itching.  Chronic obstructive pulmonary disease (COPD) COPD managed with Trelegy inhaler and albuterol . Previous CT showed atypical infection or inflammation in lower lungs. - Refill Trelegy inhaler and albuterol  for nebulizer. - Order chest x-ray to evaluate lung status. - Prescribe azithromycin  if x-ray shows pneumonia or symptoms worsen.  Restless legs syndrome Restless legs syndrome causing discomfort, managed with gabapentin . Symptoms may be associated with low iron levels. - Check iron and ferritin levels. - Continue gabapentin  three times daily.  neuropathy - Check B12 and B1 levels.  Muscle cramps Muscle cramps possibly related to electrolyte imbalances. Currently on magnesium supplementation. - Check potassium and magnesium levels. - Increase hydration and dietary intake of green vegetables.  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 Previous mini mental status exam scored well. - Consider referrral to neurologist if memory worsens for more in depth eval.  Vitamin D  deficiency Previous vitamin D  level was normal, but she reports not receiving supplements recently. - Check vitamin D  level. - Refill vitamin D  supplements if levels are low.  Anxiety -Hydroxyzine  preferred for anxiety due to dual benefit for  itching and lower risk of respiratory suppression.   Follow up date to be determined after lab and imaging review.   Evann Erazo, PA-C   Time spent with patient today was  45 minutes which consisted of chart revdiew, discussing diagnosis, work up treatment and documentation.

## 2024-04-25 NOTE — Patient Instructions (Signed)
 Chronic respiratory failure Chronic respiratory failure managed through [facility]. Oxygen saturation fluctuates between 90-94% on oxygen.  - Continue [facility]. - Monitor oxygen saturation regularly. - Refill Trelegy inhaler and albuterol  for nebulizer. - Order chest x-ray to evaluate lung status. - Prescribe azithromycin  if x-ray shows pneumonia or symptoms worsen. - Continue hydroxyzine  for anxiety and itching.  Chronic obstructive pulmonary disease (COPD) COPD managed with Trelegy inhaler and albuterol . Previous CT showed atypical infection or inflammation in lower lungs. - Refill Trelegy inhaler and albuterol  for nebulizer. - Order chest x-ray to evaluate lung status. - Prescribe azithromycin  if x-ray shows pneumonia or symptoms worsen.  Restless legs syndrome Restless legs syndrome causing discomfort, managed with gabapentin . Symptoms may be associated with low iron levels. - Check iron and ferritin levels. - Continue gabapentin  three times daily.  neuropathy - Check B12 and B1 levels.  Muscle cramps Muscle cramps possibly related to electrolyte imbalances. Currently on magnesium supplementation. - Check potassium and magnesium levels. - Increase hydration and dietary intake of green vegetables.  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 Previous mini mental status exam scored well. - Consider referrral to neurologist if memory worsens for more in depth eval.  Vitamin D  deficiency Previous vitamin D  level was normal, but she reports not receiving supplements recently. - Check vitamin D  level. - Refill vitamin D  supplements if levels are low.  Anxiety -Hydroxyzine  preferred for anxiety due to dual benefit for itching and lower risk of respiratory suppression.   Follow up date to be determined after lab and imaging review.

## 2024-04-26 ENCOUNTER — Ambulatory Visit: Payer: Self-pay | Admitting: Medical

## 2024-04-26 LAB — VITAMIN D 25 HYDROXY (VIT D DEFICIENCY, FRACTURES): Vit D, 25-Hydroxy: 92 ng/mL (ref 30–100)

## 2024-04-26 LAB — COMPREHENSIVE METABOLIC PANEL WITH GFR
AG Ratio: 1.4 (calc) (ref 1.0–2.5)
ALT: 18 U/L (ref 6–29)
AST: 19 U/L (ref 10–35)
Albumin: 4.2 g/dL (ref 3.6–5.1)
Alkaline phosphatase (APISO): 69 U/L (ref 37–153)
BUN/Creatinine Ratio: 29 (calc) — ABNORMAL HIGH (ref 6–22)
BUN: 17 mg/dL (ref 7–25)
CO2: 28 mmol/L (ref 20–32)
Calcium: 9.5 mg/dL (ref 8.6–10.4)
Chloride: 103 mmol/L (ref 98–110)
Creat: 0.59 mg/dL — ABNORMAL LOW (ref 0.60–1.00)
Globulin: 3.1 g/dL (ref 1.9–3.7)
Glucose, Bld: 89 mg/dL (ref 65–99)
Potassium: 4.3 mmol/L (ref 3.5–5.3)
Sodium: 139 mmol/L (ref 135–146)
Total Bilirubin: 0.4 mg/dL (ref 0.2–1.2)
Total Protein: 7.3 g/dL (ref 6.1–8.1)
eGFR: 96 mL/min/{1.73_m2} (ref >=60)

## 2024-04-26 LAB — MAGNESIUM: Magnesium: 2 mg/dL (ref 1.5–2.5)

## 2024-04-26 LAB — IRON: Iron: 62 ug/dL (ref 45–160)

## 2024-04-26 LAB — VITAMIN B12: Vitamin B-12: 518 pg/mL (ref 200–1100)

## 2024-04-26 LAB — FERRITIN: Ferritin: 117 ng/mL (ref 16–288)

## 2024-04-29 LAB — VITAMIN B1: Vitamin B1 (Thiamine): 20 nmol/L (ref 8–30)

## 2024-04-29 NOTE — Patient Instructions (Signed)
 Visit Information  Thank you for taking time to visit with me today. Please don't hesitate to contact me if I can be of assistance to you before our next scheduled appointment.  Your next care management appointment is by telephone on 06/12 at 1:30 PM  Please call the care guide team at [phone] if you need to cancel, schedule, or reschedule an appointment.   Please call the Suicide and Crisis Lifeline: 988 go to [facility]gent [facility] [address] [place] [phone]) call 911 if you are experiencing a Mental Health or Behavioral Health Crisis or need someone to talk to.  Alease Hunter, LCSW [place]  [facility], [facility] Clinical Social Worker Direct Dial: [phone]  Fax: [phone] Website: [email or web address] 4:43 AM

## 2024-04-29 NOTE — Patient Outreach (Signed)
 Complex Care Management   Visit Note  04/17/2024  Name:  Jill Spence MRN: [id number] DOB: May 12, 1951  Situation: Referral received for Complex Care Management related to COPD I obtained verbal consent from Patient.  Visit completed with pt  on the phone  Background:   Past Medical History:  Diagnosis Date   Anemia    low iron   Anxiety    Arthritis    Bipolar disorder (HCC)    Cancer (HCC) 1973   COPD (chronic obstructive pulmonary disease) (HCC)    Family history of adverse reaction to anesthesia    mom "was put to sleep and she never woke up" -    GERD (gastroesophageal reflux disease)    Guillain Barr syndrome (HCC)    numbness in toes, legs hurt   Headache    migraines as a teenager   IBS (irritable bowel syndrome)    Lump, breast    removed years ago per Pt.   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, has "quiet' seizures   Thyroid  disease     Assessment: Patient Reported Symptoms:  Cognitive Cognitive Status: Alert and oriented to person, place, and time, Normal speech and language skills Cognitive/Intellectual Conditions Management [RPT]: None reported or documented in medical history or problem list      Neurological Neurological Review of Symptoms: No symptoms reported    HEENT HEENT Symptoms Reported: No symptoms reported      Cardiovascular Cardiovascular Symptoms Reported: No symptoms reported    Respiratory Respiratory Symptoms Reported: No symptoms reported    Endocrine Patient reports the following symptoms related to hypoglycemia or hyperglycemia : No symptoms reported Is patient diabetic?: No    Gastrointestinal Gastrointestinal Symptoms Reported: No symptoms reported      Genitourinary Genitourinary Symptoms Reported: No symptoms reported    Integumentary Integumentary Symptoms Reported: No symptoms reported    Musculoskeletal Musculoskelatal Symptoms Reviewed: No  symptoms reported        Psychosocial Additional Psychological Details: Patient reports that she has agreed to participate in a health program through [facility], to assist with management of health symptoms. Reports frustration with trying to contact Medicaid CM. Validation and encouragement provided. LCSW strongly encouraged pt and aid to f/up with Medicaid CM to determine exactly what letter from PCP should say to assist with increasing hours, as PCP has completed multiple letters suggesting increase in aid hours.   Major Change/Loss/Stressor/Fears (CP): Medical condition, self Quality of Family Relationships: involved Do you feel physically threatened by others?: No      04/14/2024    1:32 PM  Depression screen PHQ 2/9  Decreased Interest 0  Down, Depressed, Hopeless 0  PHQ - 2 Score 0    There were no vitals filed for this visit.  Medications Reviewed Today     Reviewed by Owin Vignola D, LCSW (Social Worker) on 04/17/24 at 1341  Med List Status: <None>   Medication Order Taking? Sig Documenting Provider Last Dose Status Informant  albuterol  (PROVENTIL ) (2.5 MG/3ML) 0.083% nebulizer solution [id number] Yes Take 3 mLs (2.5 mg total) by nebulization every 6 (six) hours as needed for wheezing. Mannam, Praveen, MD Taking Active   albuterol  (VENTOLIN  HFA) 108 (90 Base) MCG/ACT inhaler [id number] Yes Inhale 2 puffs into the lungs every 6 (six) hours as needed for wheezing. Saguier, Gaylin Ke, PA-C Taking Active   alclomethasone (ACLOVATE) 0.05 % cream  [id number] Yes Apply topically 2 (two) times daily as needed. [provider] Taking Active   alendronate (FOSAMAX) 70 MG tablet [id number] Yes Take 70 mg by mouth every Sunday. Take with a full glass of water on an empty stomach. [provider] Taking Active Self  ARIPiprazole (ABILIFY) 15 MG tablet [id number] Yes Take 15 mg by mouth daily. [provider] Taking Active Self  azithromycin  (ZITHROMAX  Z-PAK) 250 MG  tablet [id number] Yes Per zpack instructions Antonio Baumgarten, NP Taking Active   B Complex Vitamins (VITAMIN B COMPLEX ) TABS [id number] Yes Take 1 tablet by mouth daily. Mannam, Praveen, MD Taking Active   benzonatate  (TESSALON ) 100 MG capsule [id number] Yes Take 1 capsule (100 mg total) by mouth 3 (three) times daily as needed for cough. Saguier, Gaylin Ke, PA-C Taking Active   benzonatate  (TESSALON ) 100 MG capsule [id number] Yes TAKE ONE CAPSULE BY MOUTH THREE TIMES DAILY AS NEEDED FOR COUGH Saguier, Gaylin Ke, PA-C Taking Active   benztropine (COGENTIN) 0.5 MG tablet [id number] Yes Take 0.5 mg by mouth 2 (two) times daily. [provider] Taking Active Self  Bepotastine  Besilate (BEPREVE) 1.5 % SOLN [id number] Yes Place 1 drop into both eyes 2 (two) times daily as needed. Saguier, Gaylin Ke, PA-C Taking Active   Bepotastine  Besilate 1.5 % SOLN [id number] Yes Apply to eye. [provider] Taking Active   cromolyn (OPTICROM) 4 % ophthalmic solution [id number] Yes 1 drop 4 (four) times daily. [provider] Taking Active   diclofenac  Sodium (VOLTAREN  ARTHRITIS PAIN) 1 % GEL [id number] Yes Apply 4 g topically 4 (four) times daily. Saguier, Gaylin Ke, PA-C Taking Active   dicyclomine  (BENTYL ) 10 MG capsule [id number]  TAKE ONE CAPSULE BY MOUTH THREE TIMES DAILY BEFORE MEALS. Saguier, Gaylin Ke, PA-C  Active   dicyclomine  (BENTYL ) 20 MG tablet [id number]  Take 1 tablet (20 mg total) by mouth 3 (three) times daily as needed. Saguier, Gaylin Ke, PA-C  Active   doxycycline  (ADOXA) 100 MG tablet [id number] Yes Take 1 tablet by mouth daily. [provider] Taking Active   Ensifentrine  (OHTUVAYRE ) 3 MG/2.5ML SUSP [id number] Yes Inhale 1 ampule into the lungs in the morning and at bedtime. Antonio Baumgarten, NP Taking Active   erythromycin ophthalmic ointment [id number] Yes Apply to eye. [provider] Taking Active   famciclovir  (FAMVIR ) 250 MG tablet [id number] Yes Take 1 tablet (250 mg total)  by mouth 2 (two) times daily. Saguier, Gaylin Ke, PA-C Taking Active   Fluticasone-Umeclidin-Vilant (TRELEGY ELLIPTA ) 200-62.5-25 MCG/ACT AEPB [id number] Yes Inhale 1 puff into the lungs daily. Saguier, Gaylin Ke, PA-C Taking Active   gabapentin  (NEURONTIN ) 400 MG capsule [id number]  Take 3 capsules (1,200 mg total) by mouth 3 (three) times daily. Saguier, Gaylin Ke, PA-C  Active   hydrOXYzine  (ATARAX ) 10 MG tablet [id number] Yes Take 1-2 tablets (10-20 mg total) by mouth at bedtime as needed for anxiety (insomnia). Saguier, Gaylin Ke, PA-C Taking Active   ibuprofen  (ADVIL ) 200 MG tablet [id number] Yes 1-4 tab po every 8 hours prn moderate pain Saguier, Gaylin Ke, PA-C Taking Active   ipratropium-albuterol  (DUONEB) 0.5-2.5 (3) MG/3ML SOLN [id number] Yes Take 3 mLs by nebulization every 4 (four) hours as needed. Mannam, Praveen, MD Taking Active   levocetirizine (XYZAL ) 5 MG tablet [id number] Yes Take 1 tablet (5 mg total) by mouth every evening. Saguier, Gaylin Ke, PA-C Taking Active   levocetirizine (XYZAL ) 5 MG tablet [id number] Yes TAKE ONE TABLET BY MOUTH EVERY EVENING Mannam, Praveen, MD Taking Active   loperamide  (IMODIUM ) 2 MG capsule [id number] Yes TAKE  ONE CAPSULE BY MOUTH EVERY 6 TO 8 HOURS AS NEEDED FOR DIARRHEA Saguier, Gaylin Ke, PA-C Taking Active   losartan  (COZAAR ) 25 MG tablet [id number] Yes TAKE 1 TABLET (25 MG TOTAL) BY MOUTH DAILY. Saguier, Gaylin Ke, PA-C Taking Active   magnesium oxide (MAG-OX) 400 MG tablet [id number] Yes Take 250 mg by mouth daily. [provider] Taking Active Self  methimazole  (TAPAZOLE ) 5 MG tablet [id number] Yes Take 1 tablet (5 mg total) by mouth daily. Saguier, Gaylin Ke, PA-C Taking Active   montelukast  (SINGULAIR ) 10 MG tablet [id number] Yes Take 1 tablet (10 mg total) by mouth at bedtime. Mannam, Praveen, MD Taking Active   NONFORMULARY OR COMPOUNDED ITEM [id number] Yes Elevated toilet seat Saguier, Gaylin Ke, PA-C Taking Active   olopatadine  (PATANOL) 0.1 % ophthalmic solution [id number]  Yes Place 1 drop into both eyes 2 (two) times daily. Saguier, Gaylin Ke, PA-C Taking Active   olopatadine  (PATANOL) 0.1 % ophthalmic solution [id number] Yes Place 1 drop into both eyes 2 (two) times daily. Saguier, Gaylin Ke, PA-C Taking Active   oxcarbazepine (TRILEPTAL) 600 MG tablet [id number] Yes Take 600 mg by mouth 2 (two) times daily. [provider] Taking Active Self  oxyCODONE  (ROXICODONE ) 5 MG immediate release tablet [id number] Yes Take 1 tablet (5 mg total) by mouth 3 (three) times daily as needed for moderate pain (pain score 4-6). Jodi Munroe, NP Taking Active   pantoprazole  (PROTONIX ) 40 MG tablet [id number] Yes Take 1 tablet (40 mg total) by mouth daily. Saguier, Gaylin Ke, PA-C Taking Active   potassium chloride  SA (KLOR-CON ) 20 MEQ tablet [id number]  Take 1 tablet (20 mEq total) by mouth 2 (two) times daily for 7 days. Edson Graces, MD  Expired 05/24/23 2359   predniSONE  (DELTASONE ) 10 MG tablet [id number] Yes Take 1 tablet (10 mg total) by mouth as needed. Mannam, Praveen, MD Taking Active   predniSONE  (DELTASONE ) 10 MG tablet [id number] Yes 4 tabs for 2 days, then 3 tabs for 2 days, 2 tabs for 2 days, then 1 tab for 2 days, then stop Antonio Baumgarten, NP Taking Active   sodium chloride  HYPERTONIC 3 % nebulizer solution [id number] Yes 3 ml via neb twice daily Mannam, Praveen, MD Taking Active   traZODone (DESYREL) 50 MG tablet [id number] No Take 50 mg by mouth at bedtime.  Patient not taking: Reported on 04/17/2024   [provider] Not Taking Active   triamcinolone  cream (KENALOG ) 0.1 % [id number] Yes Apply topically 2 (two) times daily as needed. [provider] Taking Active   triamcinolone  cream (KENALOG ) 0.1 % [id number] Yes apply topically TWICE DAILY. Saguier, Gaylin Ke, PA-C Taking Active   Vitamin D , Ergocalciferol , (DRISDOL ) 1.25 MG (50000 UNIT) CAPS capsule [id number] Yes TAKE ONE CAPSULE BY MOUTH EVERY SEVEN DAYS Saguier, Gaylin Ke, PA-C Taking Active   Vitamin  D, Ergocalciferol , (DRISDOL ) 1.25 MG (50000 UNIT) CAPS capsule [id number] Yes TAKE ONE CAPSULE BY MOUTH ONCE WEEKLY FOR SIX WEEKS Saguier, Gaylin Ke, PA-C Taking Active   Med List Note Arvel Birch 02/08/24 1350): Patient is asking for medication to be refilled today, patient is in pain.             Recommendation:   Continue Current Plan of Care  Follow Up Plan:   Telephone follow-up 2-4 weeks  Alease Hunter, LCSW [place]  [facility], [facility] Clinical Social Worker Direct Dial: [phone]  Fax: [phone] Website: [email or web address] 4:41 AM

## 2024-04-30 NOTE — Telephone Encounter (Signed)
 Spoke with pt, pt is aware of lab results and expressed understanding. Scheduled pt a follow up appt 05/30/2024.

## 2024-05-02 DIAGNOSIS — J449 Chronic obstructive pulmonary disease, unspecified: Secondary | ICD-10-CM | POA: Diagnosis not present

## 2024-05-05 DIAGNOSIS — J449 Chronic obstructive pulmonary disease, unspecified: Secondary | ICD-10-CM | POA: Diagnosis not present

## 2024-05-05 DIAGNOSIS — K219 Gastro-esophageal reflux disease without esophagitis: Secondary | ICD-10-CM | POA: Diagnosis not present

## 2024-05-05 DIAGNOSIS — G610 Guillain-Barre syndrome: Secondary | ICD-10-CM | POA: Diagnosis not present

## 2024-05-06 ENCOUNTER — Telehealth: Payer: Self-pay

## 2024-05-06 ENCOUNTER — Ambulatory Visit

## 2024-05-06 NOTE — Telephone Encounter (Signed)
Unsuccessful attempt to reach patient on preferred number listed in notes for scheduled AWV. Left message on voicemail okay to reschedule. 

## 2024-05-08 DIAGNOSIS — J449 Chronic obstructive pulmonary disease, unspecified: Secondary | ICD-10-CM | POA: Diagnosis not present

## 2024-05-09 ENCOUNTER — Other Ambulatory Visit: Payer: Self-pay | Admitting: Medical

## 2024-05-12 ENCOUNTER — Telehealth: Payer: Self-pay

## 2024-05-12 ENCOUNTER — Telehealth: Payer: Self-pay | Admitting: Registered Nurse

## 2024-05-12 ENCOUNTER — Telehealth: Payer: Self-pay | Admitting: Medical

## 2024-05-12 MED ORDER — OXYCODONE HCL 5 MG PO TABS: 5.0000 mg | ORAL_TABLET | Freq: Three times a day (TID) | ORAL | 0 refills | Status: DC | PRN

## 2024-05-12 NOTE — Telephone Encounter (Signed)
PMP was Reviewed.  Oxycodone e-scribed to pharmacy.

## 2024-05-12 NOTE — Telephone Encounter (Signed)
 Copied from CRM [id number]. Topic: Clinical - Prescription Issue >> May 09, 2024  5:30 PM Shereese L wrote: Reason for CRM: Refused Ibuprofen  800 MG TAKE ONE TABLET BY MOUTH EVERY 8 HOURS AS NEEDED FOR MODERATE PAIN.  Patient wants to know why medication is being refused and would like a call

## 2024-05-13 MED ORDER — IBUPROFEN 200 MG PO TABS: ORAL_TABLET | ORAL | 2 refills | Status: DC

## 2024-05-13 NOTE — Telephone Encounter (Signed)
 Rx sent , pt stated pharmacy stated they never got script

## 2024-05-13 NOTE — Addendum Note (Signed)
 Addended by: Ruthe Coward on: 05/13/2024 09:47 AM   Modules accepted: Orders

## 2024-05-14 DIAGNOSIS — J449 Chronic obstructive pulmonary disease, unspecified: Secondary | ICD-10-CM | POA: Diagnosis not present

## 2024-05-15 ENCOUNTER — Other Ambulatory Visit: Payer: Self-pay | Admitting: Licensed Clinical Social Worker

## 2024-05-15 NOTE — Telephone Encounter (Signed)
 Jill Spence is taking prescribed Ibuprofen  for IBS. She is not taking a blood thinner.   Patient stated the dose of Ibuprofen  to choose from is 800 or 200 MG twice daily.  (She is aware you have left the office and will not return until Monday).  Call back phone  [phone] .

## 2024-05-15 NOTE — Telephone Encounter (Addendum)
 Jill Spence wanted to know if it is safe to take Ibuprofen  800 or 200 MG, while taking Oxycodone . She is taking Ibuprofen  for IBS.  Please advise [phone].

## 2024-05-16 DIAGNOSIS — J441 Chronic obstructive pulmonary disease with (acute) exacerbation: Secondary | ICD-10-CM | POA: Diagnosis not present

## 2024-05-16 NOTE — Patient Outreach (Signed)
 Complex Care Management   Visit Note  05/15/2024  Name:  Jill Spence MRN: [id number] DOB: 04/20/1951  Situation: Referral received for Complex Care Management related to SDOH Barriers:  Stress I obtained verbal consent from Patient.  Visit completed with pt  on the phone  Background:   Past Medical History:  Diagnosis Date   Anemia    low iron   Anxiety    Arthritis    Bipolar disorder (HCC)    Cancer (HCC) 1973   COPD (chronic obstructive pulmonary disease) (HCC)    Family history of adverse reaction to anesthesia    mom was put to sleep and she never woke up -    GERD (gastroesophageal reflux disease)    Guillain Barr syndrome (HCC)    numbness in toes, legs hurt   Headache    migraines as a teenager   IBS (irritable bowel syndrome)    Lump, breast    removed years ago per Pt.   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, has quiet' seizures   Thyroid  disease     Assessment: Patient Reported Symptoms:  Cognitive Cognitive Status: Alert and oriented to person, place, and time, Normal speech and language skills Cognitive/Intellectual Conditions Management [RPT]: None reported or documented in medical history or problem list      Neurological Neurological Review of Symptoms: No symptoms reported    HEENT HEENT Symptoms Reported: No symptoms reported      Cardiovascular Cardiovascular Symptoms Reported: No symptoms reported Does patient have uncontrolled Hypertension?: No    Respiratory Respiratory Symptoms Reported: No symptoms reported Respiratory Conditions: COPD  Endocrine Patient reports the following symptoms related to hypoglycemia or hyperglycemia : No symptoms reported Is patient diabetic?: No    Gastrointestinal Gastrointestinal Symptoms Reported: Constipation, Diarrhea Additional Gastrointestinal Details: Drinks Boost but it triggers IBS diaherria/constipation. Has meds to  assist when experiencing symptoms. Gastrointestinal Conditions: Irritable bowel syndrome Gastrointestinal Management Strategies: Medication therapy    Genitourinary Genitourinary Symptoms Reported: No symptoms reported    Integumentary Integumentary Symptoms Reported: No symptoms reported    Musculoskeletal Musculoskelatal Symptoms Reviewed: No symptoms reported     Fall risk Follow up: Falls prevention discussed  Psychosocial Psychosocial Symptoms Reported: Other Other Psychosocial Conditions: Stress Additional Psychological Details: Pt reports frustration with current aid agency due to unavailable staff. She reports she plans to work with [facility] [phone] and will have an independent aid to assist during evening. She has started [facility] and states that she loves it. Self care strategies discussed Behavioral Management Strategies: Coping strategies, Support system Major Change/Loss/Stressor/Fears (CP): Medical condition, self Quality of Family Relationships: involved Do you feel physically threatened by others?: No      04/14/2024    1:32 PM  Depression screen PHQ 2/9  Decreased Interest 0  Down, Depressed, Hopeless 0  PHQ - 2 Score 0    There were no vitals filed for this visit.  Medications Reviewed Today     Reviewed by Adriana Albany, LCSW (Social Worker) on 05/16/24 at 1203  Med List Status: <None>   Medication Order Taking? Sig Documenting Provider Last Dose Status Informant  albuterol  (PROVENTIL ) (2.5 MG/3ML) 0.083% nebulizer solution [id number]  Take 3 mLs (2.5 mg total) by nebulization every 6 (six) hours as needed for wheezing. Saguier, Gaylin Ke, PA-C  Active   albuterol  (VENTOLIN  HFA) 108 (  90 Base) MCG/ACT inhaler [id number] No Inhale 2 puffs into the lungs every 6 (six) hours as needed for wheezing. Saguier, Gaylin Ke, PA-C Taking Active   alclomethasone (ACLOVATE) 0.05 % cream [id number] No Apply topically 2 (two) times daily as  needed. [provider] Taking Active   alendronate (FOSAMAX) 70 MG tablet [id number] No Take 70 mg by mouth every Sunday. Take with a full glass of water on an empty stomach. [provider] Taking Active Self  ARIPiprazole (ABILIFY) 15 MG tablet [id number] No Take 15 mg by mouth daily. [provider] Taking Active Self  azithromycin  (ZITHROMAX  Z-PAK) 250 MG tablet [id number] No Per zpack instructions Antonio Baumgarten, NP Taking Active   B Complex Vitamins (VITAMIN B COMPLEX ) TABS [id number] No Take 1 tablet by mouth daily. Mannam, Praveen, MD Taking Active   benzonatate  (TESSALON ) 100 MG capsule [id number] No Take 1 capsule (100 mg total) by mouth 3 (three) times daily as needed for cough. Saguier, Gaylin Ke, PA-C Taking Active   benzonatate  (TESSALON ) 100 MG capsule [id number]  TAKE ONE CAPSULE BY MOUTH THREE TIMES DAILY AS NEEDED FOR COUGH Saguier, Gaylin Ke, PA-C  Active   benztropine (COGENTIN) 0.5 MG tablet [id number] No Take 0.5 mg by mouth 2 (two) times daily. [provider] Taking Active Self  Bepotastine  Besilate (BEPREVE) 1.5 % SOLN [id number] No Place 1 drop into both eyes 2 (two) times daily as needed. Saguier, Gaylin Ke, PA-C Taking Active   Bepotastine  Besilate 1.5 % SOLN [id number] No Apply to eye. [provider] Taking Active   cromolyn (OPTICROM) 4 % ophthalmic solution [id number] No 1 drop 4 (four) times daily. [provider] Taking Active   diclofenac  Sodium (VOLTAREN  ARTHRITIS PAIN) 1 % GEL [id number]  Apply 4 g topically 4 (four) times daily. Saguier, Gaylin Ke, PA-C  Active   dicyclomine  (BENTYL ) 20 MG tablet [id number]  Take 1 tablet (20 mg total) by mouth 3 (three) times daily as needed. Saguier, Gaylin Ke, PA-C  Active   doxycycline  (ADOXA) 100 MG tablet [id number] No Take 1 tablet by mouth daily. [provider] Taking Active   Ensifentrine  (OHTUVAYRE ) 3 MG/2.5ML SUSP [id number] No Inhale 1 ampule into the lungs in the morning and at  bedtime. Antonio Baumgarten, NP Taking Active   erythromycin ophthalmic ointment [id number] No Apply to eye. [provider] Taking Active   famciclovir  (FAMVIR ) 250 MG tablet [id number]  Take 1 tablet (250 mg total) by mouth 2 (two) times daily. Saguier, Edward, PA-C  Active   Fluticasone-Umeclidin-Vilant (TRELEGY ELLIPTA ) 200-62.5-25 MCG/ACT AEPB [id number]  Inhale 1 puff into the lungs daily. Saguier, Gaylin Ke, PA-C  Active   gabapentin  (NEURONTIN ) 400 MG capsule [id number]  Take 3 capsules (1,200 mg total) by mouth 3 (three) times daily. Saguier, Gaylin Ke, PA-C  Active   hydrOXYzine  (ATARAX ) 10 MG tablet [id number]  Take 1-2 tablets (10-20 mg total) by mouth at bedtime as needed for anxiety (insomnia). Saguier, Gaylin Ke, PA-C  Active   ibuprofen  (ADVIL ) 200 MG tablet [id number]  1-4 tab po every 8 hours prn moderate pain Saguier, Gaylin Ke, PA-C  Active   ipratropium-albuterol  (DUONEB) 0.5-2.5 (3) MG/3ML SOLN [id number]  Take 3 mLs by nebulization every 4 (four) hours as needed. Saguier, Gaylin Ke, PA-C  Active   levocetirizine (XYZAL ) 5 MG tablet [id number] No Take 1 tablet (5 mg total) by mouth every evening. Saguier, Gaylin Ke, PA-C Taking Active   levocetirizine (XYZAL ) 5 MG tablet [id number]  Take 1 tablet (5 mg total) by mouth every evening. Saguier, Gaylin Ke, PA-C  Active   loperamide  (IMODIUM ) 2 MG capsule [id number]  TAKE ONE CAPSULE BY MOUTH EVERY 6 TO 8 HOURS AS NEEDED FOR DIARRHEA Saguier, Gaylin Ke, PA-C  Active   losartan  (COZAAR ) 25 MG tablet [id number]  Take 1 tablet (25 mg total) by mouth daily. Saguier, Gaylin Ke, PA-C  Active   magnesium oxide (MAG-OX) 400 MG tablet [id number] No Take 250 mg by mouth daily. [provider] Taking Active Self  methimazole  (TAPAZOLE ) 5 MG tablet [id number]  Take 1 tablet (5 mg total) by mouth daily. Saguier, Gaylin Ke, PA-C  Active   montelukast  (SINGULAIR ) 10 MG tablet [id number] No Take 1 tablet (10 mg total) by mouth at bedtime. Mannam, Praveen, MD Taking Active    NONFORMULARY OR COMPOUNDED ITEM [id number] No Elevated toilet seat Saguier, Gaylin Ke, PA-C Taking Active   olopatadine  (PATANOL) 0.1 % ophthalmic solution [id number] No Place 1 drop into both eyes 2 (two) times daily. Saguier, Gaylin Ke, PA-C Taking Active   olopatadine  (PATANOL) 0.1 % ophthalmic solution [id number]  Place 1 drop into both eyes 2 (two) times daily. Saguier, Gaylin Ke, PA-C  Active   oxcarbazepine (TRILEPTAL) 600 MG tablet [id number] No Take 600 mg by mouth 2 (two) times daily. [provider] Taking Active Self  oxyCODONE  (ROXICODONE ) 5 MG immediate release tablet [id number]  Take 1 tablet (5 mg total) by mouth 3 (three) times daily as needed for moderate pain (pain score 4-6). Jodi Munroe, NP  Active   pantoprazole  (PROTONIX ) 40 MG tablet [id number]  Take 1 tablet (40 mg total) by mouth daily. Saguier, Gaylin Ke, PA-C  Active   potassium chloride  SA (KLOR-CON ) 20 MEQ tablet [id number] No Take 1 tablet (20 mEq total) by mouth 2 (two) times daily for 7 days. Edson Graces, MD Taking Expired 05/24/23 2359   predniSONE  (DELTASONE ) 10 MG tablet [id number] No Take 1 tablet (10 mg total) by mouth as needed. Mannam, Praveen, MD Taking Active   predniSONE  (DELTASONE ) 10 MG tablet [id number] No 4 tabs for 2 days, then 3 tabs for 2 days, 2 tabs for 2 days, then 1 tab for 2 days, then stop Antonio Baumgarten, NP Taking Active   sodium chloride  HYPERTONIC 3 % nebulizer solution [id number] No 3 ml via neb twice daily Mannam, Praveen, MD Taking Active   traZODone (DESYREL) 50 MG tablet [id number] No Take 50 mg by mouth at bedtime.  Patient not taking: Reported on 04/17/2024   [provider] Not Taking Active   triamcinolone  cream (KENALOG ) 0.1 % [id number] No apply topically TWICE DAILY. Saguier, Gaylin Ke, PA-C Taking Active   triamcinolone  cream (KENALOG ) 0.1 % [id number]  Apply topically 2 (two) times daily as needed. Saguier, Gaylin Ke, PA-C  Active   Vitamin D , Ergocalciferol , (DRISDOL ) 1.25 MG  (50000 UNIT) CAPS capsule [id number] No TAKE ONE CAPSULE BY MOUTH EVERY SEVEN DAYS Saguier, Gaylin Ke, PA-C Taking Active   Vitamin D , Ergocalciferol , (DRISDOL ) 1.25 MG (50000 UNIT) CAPS capsule [id number]  TAKE ONE CAPSULE BY MOUTH ONCE WEEKLY FOR SIX WEEKS. Webb, Padonda B, FNP  Active   Med List Note Arvel Birch 02/08/24 1350): Patient is asking for medication to be refilled today, patient is in pain.             Recommendation:   Continue Current Plan of Care  Follow Up Plan:   Telephone follow-up in 1 month  Alease Hunter, LCSW [facility]  [facility], [facility] Clinical Social Worker Direct Dial: [phone]  Fax: [phone] Website: [email or web address] 12:17 PM

## 2024-05-16 NOTE — Patient Instructions (Signed)
 Visit Information  Thank you for taking time to visit with me today. Please don't hesitate to contact me if I can be of assistance to you before our next scheduled appointment.  Your next care management appointment is by telephone on 07/3 at 1:30 PM  Please call the care guide team at [phone] if you need to cancel, schedule, or reschedule an appointment.   Please call the Suicide and Crisis Lifeline: 988 go to [facility] [address] [place] [phone]) call 911 if you are experiencing a Mental Health or Behavioral Health Crisis or need someone to talk to.  Alease Hunter, LCSW [place]  [facility], [facility] Clinical Social Worker Direct Dial: [phone]  Fax: [phone] Website: [email or web address] 12:17 PM

## 2024-05-19 DIAGNOSIS — J449 Chronic obstructive pulmonary disease, unspecified: Secondary | ICD-10-CM | POA: Diagnosis not present

## 2024-05-20 ENCOUNTER — Telehealth: Payer: Self-pay | Admitting: Medical

## 2024-05-20 NOTE — Telephone Encounter (Signed)
 Copied from CRM [id number]. Topic: General - Call Back - No Documentation >> May 20, 2024 12:55 PM Lajean Pike wrote: Reason for CRM: [id number], Trevor Fudge with [employer], is calling to confirm if the fax was received for medicaid prior approval for patient to receive underpads for incontinence.

## 2024-05-21 ENCOUNTER — Telehealth: Payer: Self-pay | Admitting: Medical

## 2024-05-21 NOTE — Telephone Encounter (Signed)
 faxed

## 2024-05-21 NOTE — Telephone Encounter (Signed)
 Faxed today

## 2024-05-21 NOTE — Telephone Encounter (Signed)
 [place] dma request form filled out. Please fax over. If additional information needed pt needs appointment in office. Fax over form

## 2024-05-30 ENCOUNTER — Other Ambulatory Visit: Payer: Self-pay

## 2024-05-30 ENCOUNTER — Ambulatory Visit (INDEPENDENT_AMBULATORY_CARE_PROVIDER_SITE_OTHER): Admitting: Medical

## 2024-05-30 VITALS — BP 114/84 | HR 74 | Resp 18 | Ht 63.5 in | Wt 168.0 lb

## 2024-05-30 DIAGNOSIS — G629 Polyneuropathy, unspecified: Secondary | ICD-10-CM

## 2024-05-30 DIAGNOSIS — J449 Chronic obstructive pulmonary disease, unspecified: Secondary | ICD-10-CM

## 2024-05-30 DIAGNOSIS — I10 Essential (primary) hypertension: Secondary | ICD-10-CM

## 2024-05-30 DIAGNOSIS — M25562 Pain in left knee: Secondary | ICD-10-CM | POA: Diagnosis not present

## 2024-05-30 DIAGNOSIS — M7989 Other specified soft tissue disorders: Secondary | ICD-10-CM | POA: Diagnosis not present

## 2024-05-30 DIAGNOSIS — G8929 Other chronic pain: Secondary | ICD-10-CM

## 2024-05-30 DIAGNOSIS — M25561 Pain in right knee: Secondary | ICD-10-CM

## 2024-05-30 DIAGNOSIS — F419 Anxiety disorder, unspecified: Secondary | ICD-10-CM

## 2024-05-30 MED ORDER — MUPIROCIN 2 % EX OINT: 1.0000 | TOPICAL_OINTMENT | Freq: Two times a day (BID) | CUTANEOUS | 0 refills | Status: AC

## 2024-05-30 MED ORDER — LOPERAMIDE HCL 2 MG PO CAPS: ORAL_CAPSULE | ORAL | 5 refills | Status: DC

## 2024-05-30 MED ORDER — LEVOCETIRIZINE DIHYDROCHLORIDE 5 MG PO TABS: 5.0000 mg | ORAL_TABLET | Freq: Every evening | ORAL | 2 refills | Status: DC

## 2024-05-30 MED ORDER — VITAMIN D (ERGOCALCIFEROL) 1.25 MG (50000 UNIT) PO CAPS: ORAL_CAPSULE | ORAL | 0 refills | Status: AC

## 2024-05-30 MED ORDER — VITAMIN D (ERGOCALCIFEROL) 1.25 MG (50000 UNIT) PO CAPS: 50000.0000 [IU] | ORAL_CAPSULE | ORAL | 2 refills | Status: AC

## 2024-05-30 MED ORDER — BEPOTASTINE BESILATE 1.5 % OP SOLN: 1.0000 [drp] | Freq: Two times a day (BID) | OPHTHALMIC | 5 refills | Status: AC | PRN

## 2024-05-30 MED ORDER — BENZONATATE 100 MG PO CAPS: 100.0000 mg | ORAL_CAPSULE | Freq: Three times a day (TID) | ORAL | 2 refills | Status: AC | PRN

## 2024-05-30 MED ORDER — LOSARTAN POTASSIUM 25 MG PO TABS
25.0000 mg | ORAL_TABLET | Freq: Every day | ORAL | 2 refills | Status: DC
Start: 2024-05-30 — End: 2024-06-09

## 2024-05-30 MED ORDER — DICLOFENAC SODIUM 1 % EX GEL: 4.0000 g | Freq: Four times a day (QID) | CUTANEOUS | 3 refills | Status: DC

## 2024-05-30 MED ORDER — MONTELUKAST SODIUM 10 MG PO TABS
10.0000 mg | ORAL_TABLET | Freq: Every day | ORAL | 3 refills | Status: DC
Start: 2024-05-30 — End: 2024-07-15

## 2024-05-30 MED ORDER — TRELEGY ELLIPTA 200-62.5-25 MCG/ACT IN AEPB: 1.0000 | INHALATION_SPRAY | Freq: Every day | RESPIRATORY_TRACT | 2 refills | Status: AC

## 2024-05-30 MED ORDER — IBUPROFEN 200 MG PO TABS: ORAL_TABLET | ORAL | 2 refills | Status: AC

## 2024-05-30 MED ORDER — TRIAMCINOLONE ACETONIDE 0.1 % EX CREA: TOPICAL_CREAM | Freq: Two times a day (BID) | CUTANEOUS | 2 refills | Status: AC

## 2024-05-30 MED ORDER — DICYCLOMINE HCL 20 MG PO TABS: 20.0000 mg | ORAL_TABLET | Freq: Three times a day (TID) | ORAL | 2 refills | Status: DC | PRN

## 2024-05-30 MED ORDER — HYDROXYZINE HCL 10 MG PO TABS: 10.0000 mg | ORAL_TABLET | Freq: Every evening | ORAL | 2 refills | Status: DC | PRN

## 2024-05-30 MED ORDER — ALBUTEROL SULFATE HFA 108 (90 BASE) MCG/ACT IN AERS: 2.0000 | INHALATION_SPRAY | Freq: Four times a day (QID) | RESPIRATORY_TRACT | 11 refills | Status: DC | PRN

## 2024-05-30 MED ORDER — OLOPATADINE HCL 0.1 % OP SOLN
1.0000 [drp] | Freq: Two times a day (BID) | OPHTHALMIC | 12 refills | Status: AC
Start: 2024-05-30 — End: ?

## 2024-05-30 MED ORDER — PANTOPRAZOLE SODIUM 40 MG PO TBEC: 40.0000 mg | DELAYED_RELEASE_TABLET | Freq: Every day | ORAL | 3 refills | Status: DC

## 2024-05-30 MED ORDER — GABAPENTIN 400 MG PO CAPS: 1200.0000 mg | ORAL_CAPSULE | Freq: Three times a day (TID) | ORAL | 2 refills | Status: DC

## 2024-05-30 MED ORDER — FAMCICLOVIR 250 MG PO TABS: 250.0000 mg | ORAL_TABLET | Freq: Two times a day (BID) | ORAL | 2 refills | Status: DC

## 2024-05-30 MED ORDER — METHIMAZOLE 5 MG PO TABS: 5.0000 mg | ORAL_TABLET | Freq: Every day | ORAL | 2 refills | Status: DC

## 2024-05-30 NOTE — Patient Instructions (Signed)
 Left lower extremity swelling Intermittent swelling, chronic issue. Previous ultrasound negative for acute findings. Differential includes DVT due to asymmetry and risk factors. Discussed DVT risks and need for ruling out to prevent pulmonary embolism. - Order left lower extremity ultrasound to rule out DVT. To be done stat.   Chronic respiratory failure Chronic respiratory failure requiring supplemental oxygen . Exacerbated by temperature changes and humidity. Managed with pulmonologist follow-up and medication regimen. - Continue current respiratory medications: albuterol  inhaler, ipratropium , and Trelegy inhaler.  Chronic obstructive pulmonary disease (COPD) Stage 3 COPD Stage 3 with chronic respiratory failure. Managed with medication regimen and pulmonology follow-up. - Continue current respiratory medications: albuterol  inhaler, ipratropium solution, and Trelegy inhaler. -continue 02 daily  Hypertension Well-controlled on current medication regimen. - Continue losartan  25 mg daily.  Peripheral neuropathy Managed with gabapentin . Refill requested. - Refill gabapentin  for neuropathy.  Knee pain Chronic bilateral knee pain, no recent imaging. Prefers prescription ibuprofen  due to accessibility issues. Concerns about long-term high-dose ibuprofen  use and renal effects. - Order future x-rays of knees. - Prescribe ibuprofen  200 mg with caution to avoid overuse.(max dose 400 mg) -can also use tylenol .  Folliculitis Folliculitis on right cheek, present since last Friday. No tenderness. Discussed topical antibiotic application and makeup avoidance to prevent exacerbation. - Prescribe mupirocin ointment, apply twice daily for 7-10 days. - Advise against wearing makeup for 7-10 days. - Schedule follow-up in 10 days if no improvement.  follow up late september for routine chronic condition follow up.

## 2024-05-30 NOTE — Progress Notes (Signed)
 Subjective:    Patient ID: Jill Spence, female    DOB: 01/22/1951, 73 y.o.   MRN: [id number]  HPI  Pt in for follow up.  Chronic respiratory failure Chronic respiratory failure managed through [facility]. Oxygen  saturation fluctuates between 90-94% on oxygen .  - Continue [facility]. - Monitor oxygen  saturation regularly. - Refill Trelegy inhaler and albuterol  for nebulizer. - Order chest x-ray to evaluate lung status. - Prescribe azithromycin  if x-ray shows pneumonia or symptoms worsen. - Continue hydroxyzine  for anxiety and itching.   Chronic obstructive pulmonary disease (COPD) COPD managed with Trelegy inhaler and albuterol . Previous CT showed atypical infection or inflammation in lower lungs. - Refill Trelegy inhaler and albuterol  for nebulizer. - Order chest x-ray to evaluate lung status. - Prescribe azithromycin  if x-ray shows pneumonia or symptoms worsen.   Restless legs syndrome Restless legs syndrome causing discomfort, managed with gabapentin . Symptoms may be associated with low iron levels. - Check iron and ferritin levels. - Continue gabapentin  three times daily.   neuropathy - Check B12 and B1 levels.   Muscle cramps Muscle cramps possibly related to electrolyte imbalances. Currently on magnesium supplementation. - Check potassium and magnesium levels. - Increase hydration and dietary intake of green vegetables.   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 Previous mini mental status exam scored well. - Consider referrral to neurologist if memory worsens for more in depth eval.   Vitamin D  deficiency Previous vitamin D  level was normal, but she reports not receiving supplements recently. - Check vitamin D  level. - Refill vitamin D  supplements if levels are low.   Anxiety -Hydroxyzine  preferred for anxiety due to dual benefit for itching and lower risk  of respiratory suppression.  Jill Spence is a 73 year old female with chronic respiratory failure and COPD who presents with left leg swelling and respiratory issues.  She experiences swelling in her left leg, which was significantly larger yesterday, particularly in the foot and extending up to the calf. The swelling has decreased today but was quite pronounced the previous day. This leg occasionally swells, and an ultrasound was performed in January 2025. She attributes the recent swelling to the hot and humid weather, as her central air conditioning was out recently. No pain in the swollen left foot, but swelling extends to the calf.  She experiences respiratory issues, particularly when exposed to hot, humid air or when transitioning from hot to cold environments. She is on daily oxygen  therapy and uses several inhalers, including albuterol , ipratropium solution, and Trelegy, for her chronic respiratory failure and COPD stage three. She needs refills for her medications, including her blood pressure medication, losartan  25 mg daily, and gabapentin  for neuropathy in her lower extremities. Chronic respiratory issues are exacerbated by temperature changes and hot, humid air.  She reports pain in her knees and lower extremities, for which she has been using ibuprofen  400 mg twice a day. I have expressed concerned about the long-term effects of high-dose ibuprofen  on her kidneys and thus refused to rx 600 or 800 mg dose despite numerous request for higher doses. Both knees are painful, and she has not had x-rays of her knees recently.  Additionally, she has noticed bumps on her right cheek and near her lips since last Friday, which do not hurt but are concerning to her.   Review of Systems  Constitutional:  Negative for chills, fatigue and fever.  HENT:  Negative for congestion and ear pain.   Respiratory:  Positive for shortness of breath. Negative for chest tightness and wheezing.         Chronic dyspnea but at baseline.  Cardiovascular:  Negative for chest pain and palpitations.  Gastrointestinal:  Negative for abdominal distention, abdominal pain, blood in stool and nausea.  Genitourinary:  Negative for dysuria and frequency.  Musculoskeletal:        See hpi  Neurological:  Negative for dizziness, speech difficulty, weakness and light-headedness.  Hematological:  Negative for adenopathy. Does not bruise/bleed easily.  Psychiatric/Behavioral:  Negative for behavioral problems and confusion.     Past Medical History:  Diagnosis Date   Anemia    low iron   Anxiety    Arthritis    Bipolar disorder (HCC)    Cancer (HCC) 1973   COPD (chronic obstructive pulmonary disease) (HCC)    Family history of adverse reaction to anesthesia    mom was put to sleep and she never woke up -    GERD (gastroesophageal reflux disease)    Guillain Barr syndrome (HCC)    numbness in toes, legs hurt   Headache    migraines as a teenager   IBS (irritable bowel syndrome)    Lump, breast    removed years ago per Pt.   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, has quiet' seizures   Thyroid  disease      Social History   Socioeconomic History   Marital status: Widowed    Spouse name: Not on file   Number of children: Not on file   Years of education: Not on file   Highest education level: Some college, no degree  Occupational History   Not on file  Tobacco Use   Smoking status: Former    Current packs/day: 0.00    Average packs/day: 0.3 packs/day for 6.0 years (1.5 ttl pk-yrs)    Types: Cigarettes    Start date: 10/13/2001    Quit date: 10/14/2007    Years since quitting: 16.6   Smokeless tobacco: Never  Vaping Use   Vaping status: Never Used  Substance and Sexual Activity   Alcohol use: No   Drug use: No   Sexual activity: Not on file  Other Topics Concern   Not on file  Social History  Narrative   Not on file   Social Drivers of Health   Financial Resource Strain: Medium Risk (01/14/2024)   Overall Financial Resource Strain (CARDIA)    Difficulty of Paying Living Expenses: Somewhat hard  Food Insecurity: No Food Insecurity (01/14/2024)   Hunger Vital Sign    Worried About Running Out of Food in the Last Year: Never true    Ran Out of Food in the Last Year: Never true  Transportation Needs: No Transportation Needs (01/14/2024)   PRAPARE - Administrator, Civil Service (Medical): No    Lack of Transportation (Non-Medical): No  Physical Activity: Unknown (01/14/2024)   Exercise Vital Sign    Days of Exercise per Week: 0 days    Minutes of Exercise per Session: Not on file  Recent Concern: Physical Activity - Inactive (01/14/2024)   Exercise Vital Sign    Days of Exercise per Week: 0 days    Minutes of Exercise per Session: 30 min  Stress: Stress Concern Present (01/14/2024)   [employer] of Occupational  Health - Occupational Stress Questionnaire    Feeling of Stress : Very much  Social Connections: Moderately Isolated (01/14/2024)   Social Connection and Isolation Panel    Frequency of Communication with Friends and Family: More than three times a week    Frequency of Social Gatherings with Friends and Family: Once a week    Attends Religious Services: Never    Database administrator or Organizations: Yes    Attends Banker Meetings: 1 to 4 times per year    Marital Status: Widowed  Intimate Partner Violence: Not At Risk (05/15/2024)   Humiliation, Afraid, Rape, and Kick questionnaire    Fear of Current or Ex-Partner: No    Emotionally Abused: No    Physically Abused: No    Sexually Abused: No    Past Surgical History:  Procedure Laterality Date   BREAST SURGERY     COLONOSCOPY     EYE SURGERY Bilateral    cataract surgery with lens implants   MANDIBLE RECONSTRUCTION     MINOR REMOVAL OF MANDIBULAR HARDWARE Right 10/15/2015    Procedure: MINOR REMOVAL OF Right MANDIBULAR HARDWARE;  Surgeon: Glendia Primrose, DDS;  Location: [facility] OR;  Service: Oral Surgery;  Laterality: Right;   TONSILLECTOMY      Family History  Problem Relation Age of Onset   Cancer Father     Allergies  Allergen Reactions   Influenza Vaccines    Aspirin     Other Reaction(s): GI Intolerance    Current Outpatient Medications on File Prior to Visit  Medication Sig Dispense Refill   albuterol  (PROVENTIL ) (2.5 MG/3ML) 0.083% nebulizer solution Take 3 mLs (2.5 mg total) by nebulization every 6 (six) hours as needed for wheezing. 180 mL 2   alclomethasone (ACLOVATE) 0.05 % cream Apply topically 2 (two) times daily as needed.     alendronate (FOSAMAX) 70 MG tablet Take 70 mg by mouth every Sunday. Take with a full glass of water on an empty stomach.     ARIPiprazole (ABILIFY) 15 MG tablet Take 15 mg by mouth daily.     azithromycin  (ZITHROMAX  Z-PAK) 250 MG tablet Per zpack instructions 6 tablet 0   B Complex Vitamins (VITAMIN B COMPLEX ) TABS Take 1 tablet by mouth daily. 30 tablet 5   benzonatate  (TESSALON ) 100 MG capsule TAKE ONE CAPSULE BY MOUTH THREE TIMES DAILY AS NEEDED FOR COUGH 30 capsule 2   benztropine (COGENTIN) 0.5 MG tablet Take 0.5 mg by mouth 2 (two) times daily.     Bepotastine  Besilate 1.5 % SOLN Apply to eye.     cromolyn (OPTICROM) 4 % ophthalmic solution 1 drop 4 (four) times daily.     doxycycline  (ADOXA) 100 MG tablet Take 1 tablet by mouth daily.     Ensifentrine  (OHTUVAYRE ) 3 MG/2.5ML SUSP Inhale 1 ampule into the lungs in the morning and at bedtime.     erythromycin ophthalmic ointment Apply to eye.     ipratropium-albuterol  (DUONEB) 0.5-2.5 (3) MG/3ML SOLN Take 3 mLs by nebulization every 4 (four) hours as needed. 360 mL 2   levocetirizine (XYZAL ) 5 MG tablet Take 1 tablet (5 mg total) by mouth every evening. 30 tablet 3   magnesium oxide (MAG-OX) 400 MG tablet Take 250 mg by mouth daily.     NONFORMULARY OR COMPOUNDED ITEM  Elevated toilet seat 2 each 0   olopatadine  (PATANOL) 0.1 % ophthalmic solution Place 1 drop into both eyes 2 (two) times daily. 5 mL 3   oxcarbazepine (  TRILEPTAL) 600 MG tablet Take 600 mg by mouth 2 (two) times daily.     oxyCODONE  (ROXICODONE ) 5 MG immediate release tablet Take 1 tablet (5 mg total) by mouth 3 (three) times daily as needed for moderate pain (pain score 4-6). 90 tablet 0   potassium chloride  SA (KLOR-CON ) 20 MEQ tablet Take 1 tablet (20 mEq total) by mouth 2 (two) times daily for 7 days. 14 tablet 0   predniSONE  (DELTASONE ) 10 MG tablet Take 1 tablet (10 mg total) by mouth as needed. 20 tablet 0   predniSONE  (DELTASONE ) 10 MG tablet 4 tabs for 2 days, then 3 tabs for 2 days, 2 tabs for 2 days, then 1 tab for 2 days, then stop 20 tablet 0   sodium chloride  HYPERTONIC 3 % nebulizer solution 3 ml via neb twice daily 750 mL 12   traZODone (DESYREL) 50 MG tablet Take 50 mg by mouth at bedtime. (Patient not taking: Reported on 04/17/2024)     triamcinolone  cream (KENALOG ) 0.1 % Apply topically 2 (two) times daily as needed. 30 g 3   Vitamin D , Ergocalciferol , (DRISDOL ) 1.25 MG (50000 UNIT) CAPS capsule TAKE ONE CAPSULE BY MOUTH ONCE WEEKLY FOR SIX WEEKS. 6 capsule 0   No current facility-administered medications on file prior to visit.    BP 114/84   Pulse 74   Resp 18   Ht 5' 3.5 (1.613 m)   Wt 168 lb (76.2 kg)   SpO2 95%   BMI 29.29 kg/m        Objective:   Physical Exam  General Mental Status- Alert. General Appearance- Not in acute distress.    Neck No JVD.  Chest and Lung Exam Auscultation: Breath Sounds:-CTA  Cardiovascular Auscultation:Rythm- RRR Murmurs & Other Heart Sounds:Auscultation of the heart reveals- No Murmurs.  Abdomen Inspection:-Inspeection Normal. Palpation/Percussion:Note:No mass. Palpation and Percussion of the abdomen reveal- Non Tender, Non Distended + BS, no rebound or guarding.  Neurologic Cranial Nerve exam:- CN III-XII  intact(No nystagmus), symmetric smile. Strength:- 5/5 equal and symmetric strength both upper and lower extremities.   Left lower ext- moderate distal calf swelling and foot pain. Knees- good rom. No obvious swelling of knees presenty.  Skin- foliculits to rt side of face/lower cheek area.    Assessment & Plan:   Patient Instructions  Left lower extremity swelling Intermittent swelling, chronic issue. Previous ultrasound negative for acute findings. Differential includes DVT due to asymmetry and risk factors. Discussed DVT risks and need for ruling out to prevent pulmonary embolism. - Order left lower extremity ultrasound to rule out DVT. To be done stat.   Chronic respiratory failure Chronic respiratory failure requiring supplemental oxygen . Exacerbated by temperature changes and humidity. Managed with pulmonologist follow-up and medication regimen. - Continue current respiratory medications: albuterol  inhaler, ipratropium , and Trelegy inhaler.  Chronic obstructive pulmonary disease (COPD) Stage 3 COPD Stage 3 with chronic respiratory failure. Managed with medication regimen and pulmonology follow-up. - Continue current respiratory medications: albuterol  inhaler, ipratropium solution, and Trelegy inhaler. -continue 02 daily  Hypertension Well-controlled on current medication regimen. - Continue losartan  25 mg daily.  Peripheral neuropathy Managed with gabapentin . Refill requested. - Refill gabapentin  for neuropathy.  Knee pain Chronic bilateral knee pain, no recent imaging. Prefers prescription ibuprofen  due to accessibility issues. Concerns about long-term high-dose ibuprofen  use and renal effects. - Order future x-rays of knees. - Prescribe ibuprofen  200 mg with caution to avoid overuse.(max dose 400 mg) -can also use tylenol .  Folliculitis Folliculitis on right  cheek, present since last Friday. No tenderness. Discussed topical antibiotic application and makeup avoidance  to prevent exacerbation. - Prescribe mupirocin ointment, apply twice daily for 7-10 days. - Advise against wearing makeup for 7-10 days. - Schedule follow-up in 10 days if no improvement.  follow up late september for routine chronic condition follow up.   Damany Eastman, PA-C   Time spent with patient today was  45 minutes which consisted of chart review, discussing diagnosis, work up treatment and documentation.

## 2024-06-02 DIAGNOSIS — J449 Chronic obstructive pulmonary disease, unspecified: Secondary | ICD-10-CM | POA: Diagnosis not present

## 2024-06-05 ENCOUNTER — Other Ambulatory Visit: Payer: Self-pay | Admitting: Licensed Clinical Social Worker

## 2024-06-07 DIAGNOSIS — J449 Chronic obstructive pulmonary disease, unspecified: Secondary | ICD-10-CM | POA: Diagnosis not present

## 2024-06-08 ENCOUNTER — Other Ambulatory Visit: Payer: Self-pay | Admitting: Medical

## 2024-06-09 NOTE — Progress Notes (Signed)
 Subjective:    Patient ID: Jill Spence, female    DOB: [birth year], 73 y.o.   MRN: [id number]  HPI: Jill Spence is a 73 y.o. female who returns for follow up appointment for chronic pain and medication refill. She states her pain is located in her neck, lower back radiating into her bilateral lower extremities and generalized joint pain. She rates her pain 7.Her  current exercise regime is walking and performing stretching exercises.  Ms. Gunnarson Morphine equivalent is 22.50 MME.   Last Oral Swab was Performed on 02/11/2024, it was consistent..      Pain Inventory Average Pain 7 Pain Right Now 7 My pain is constant and aching  In the last 24 hours, has pain interfered with the following? General activity 5 Relation with others 0 Enjoyment of life 0 What TIME of day is your pain at its worst? night Sleep (in general) Poor  Pain is worse with: walking and bending Pain improves with: rest, therapy/exercise, pacing activities, and medication Relief from Meds: 9  Family History  Problem Relation Age of Onset   Cancer Father    Social History   Socioeconomic History   Marital status: Widowed    Spouse name: Not on file   Number of children: Not on file   Years of education: Not on file   Highest education level: Some college, no degree  Occupational History   Not on file  Tobacco Use   Smoking status: Former    Current packs/day: 0.00    Average packs/day: 0.3 packs/day for 6.0 years (1.5 ttl pk-yrs)    Types: Cigarettes    Start date: 10/13/2001    Quit date: 10/14/2007    Years since quitting: 16.6   Smokeless tobacco: Never  Vaping Use   Vaping status: Never Used  Substance and Sexual Activity   Alcohol use: No   Drug use: No   Sexual activity: Not on file  Other Topics Concern   Not on file  Social History Narrative   Not on file   Social Drivers of Health   Financial Resource Strain: Medium Risk (01/14/2024)   Overall Financial Resource Strain  (CARDIA)    Difficulty of Paying Living Expenses: Somewhat hard  Food Insecurity: No Food Insecurity (01/14/2024)   Hunger Vital Sign    Worried About Running Out of Food in the Last Year: Never true    Ran Out of Food in the Last Year: Never true  Transportation Needs: No Transportation Needs (01/14/2024)   PRAPARE - Administrator, Civil Service (Medical): No    Lack of Transportation (Non-Medical): No  Physical Activity: Unknown (01/14/2024)   Exercise Vital Sign    Days of Exercise per Week: 0 days    Minutes of Exercise per Session: Not on file  Recent Concern: Physical Activity - Inactive (01/14/2024)   Exercise Vital Sign    Days of Exercise per Week: 0 days    Minutes of Exercise per Session: 30 min  Stress: Stress Concern Present (01/14/2024)   [employer] of Occupational Health - Occupational Stress Questionnaire    Feeling of Stress : Very much  Social Connections: Moderately Isolated (01/14/2024)   Social Connection and Isolation Panel    Frequency of Communication with Friends and Family: More than three times a week    Frequency of Social Gatherings with Friends and Family: Once a week    Attends Religious Services: Never    Database administrator or  Organizations: Yes    Attends Banker Meetings: 1 to 4 times per year    Marital Status: Widowed   Past Surgical History:  Procedure Laterality Date   BREAST SURGERY     COLONOSCOPY     EYE SURGERY Bilateral    cataract surgery with lens implants   MANDIBLE RECONSTRUCTION     MINOR REMOVAL OF MANDIBULAR HARDWARE Right 10/15/2015   Procedure: MINOR REMOVAL OF Right MANDIBULAR HARDWARE;  Surgeon: Glendia Primrose, DDS;  Location: [facility] OR;  Service: Oral Surgery;  Laterality: Right;   TONSILLECTOMY     Past Surgical History:  Procedure Laterality Date   BREAST SURGERY     COLONOSCOPY     EYE SURGERY Bilateral    cataract surgery with lens implants   MANDIBLE RECONSTRUCTION     MINOR REMOVAL OF  MANDIBULAR HARDWARE Right 10/15/2015   Procedure: MINOR REMOVAL OF Right MANDIBULAR HARDWARE;  Surgeon: Glendia Primrose, DDS;  Location: [facility] OR;  Service: Oral Surgery;  Laterality: Right;   TONSILLECTOMY     Past Medical History:  Diagnosis Date   Anemia    low iron   Anxiety    Arthritis    Bipolar disorder (HCC)    Cancer (HCC) 1973   COPD (chronic obstructive pulmonary disease) (HCC)    Family history of adverse reaction to anesthesia    mom was put to sleep and she never woke up -    GERD (gastroesophageal reflux disease)    Guillain Barr syndrome (HCC)    numbness in toes, legs hurt   Headache    migraines as a teenager   IBS (irritable bowel syndrome)    Lump, breast    removed years ago per Pt.   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  Opioid Risk Score:   Fall Risk Score:  `1  Depression screen PHQ 2/9     04/14/2024    1:32 PM 02/11/2024    1:33 PM 05/02/2023    3:50 PM 04/19/2023    1:43 PM 02/20/2023    1:54 PM 12/07/2022    1:42 PM 10/05/2022    1:58 PM  Depression screen PHQ 2/9  Decreased Interest 0 0 0 0 0 0 0  Down, Depressed, Hopeless 0 0 0 0 0 0 0  PHQ - 2 Score 0 0 0 0 0 0 0    Review of Systems  Musculoskeletal:  Positive for back pain, gait problem and neck pain.       Pain in all joints, knees, elbows  All other systems reviewed and are negative.      Objective:   Physical Exam Vitals and nursing note reviewed.  Constitutional:      Appearance: Normal appearance.  Cardiovascular:     Rate and Rhythm: Normal rate and regular rhythm.     Pulses: Normal pulses.     Heart sounds: Normal heart sounds.  Pulmonary:     Effort: Pulmonary effort is normal.     Breath sounds: Normal breath sounds.     Comments: Continuous Oxygen  3 Liters nasal cannula Musculoskeletal:     Comments:  Normal Muscle Bulk and Muscle Testing Reveals:  Upper Extremities: Full ROM and Muscle Strength 5/5 Lumbar Paraspinal Tenderness: L-3-L-5 Lower Extremities: Right: Decreased ROM  and Muscle Strength 5/5 Right Lower Extremity Flexion Produces Pain into her Right Patella Left Lower Extremity: Full ROM and Muscle Strength 5/5 Arises from Table slowly using walker for support Narrow Based  Gait     Skin:    General: Skin is warm and dry.  Neurological:     Mental Status: She is alert and oriented to person, place, and time.  Psychiatric:        Mood and Affect: Mood normal.        Behavior: Behavior normal.          Assessment & Plan:  Guillain Barre Syndrome: Continue current medication regimen. Continue Outpatient Therapy. Continue current medication regimen. Continue to Monitor. 06/10/2024 Cervicalgia: Continue HEP as Tolerated. Continue to monitor.06/10/2024 Polyarthralgia: Continue current medication regimen . Continue to Monitor. 06/10/2024 Chronic Bilateral Low Back Pain: Continue HEP as Tolerated. Continue to Monitor. 06/10/2024 Chronic Pain of Bilateral Knee Pain R>L: Continue Outpatient Therapy. Continue current medication regimen. Continue to Monitor. 06/10/2024 Chronic Pain Syndrome:  Continue Oxycodone  5 mg three times a day as needed for pain. #90.  We will continue the opioid monitoring program, this consists of regular clinic visits, examinations, urine drug screen, pill counts as well as use of [place]  Controlled Substance Reporting system. A 12 month History has been reviewed on the [place]  Controlled Substance Reporting System on 06/10/2024   F/U in 2 months

## 2024-06-10 ENCOUNTER — Encounter: Attending: Physical Medicine and Rehabilitation | Admitting: Registered Nurse

## 2024-06-10 ENCOUNTER — Encounter: Payer: Self-pay | Admitting: Registered Nurse

## 2024-06-10 VITALS — BP 97/64 | HR 80 | Ht 63.5 in | Wt 165.0 lb

## 2024-06-10 DIAGNOSIS — Z79891 Long term (current) use of opiate analgesic: Secondary | ICD-10-CM | POA: Insufficient documentation

## 2024-06-10 DIAGNOSIS — M542 Cervicalgia: Secondary | ICD-10-CM | POA: Diagnosis not present

## 2024-06-10 DIAGNOSIS — G610 Guillain-Barre syndrome: Secondary | ICD-10-CM | POA: Diagnosis not present

## 2024-06-10 DIAGNOSIS — M2550 Pain in unspecified joint: Secondary | ICD-10-CM | POA: Insufficient documentation

## 2024-06-10 DIAGNOSIS — G894 Chronic pain syndrome: Secondary | ICD-10-CM | POA: Insufficient documentation

## 2024-06-10 DIAGNOSIS — Z5181 Encounter for therapeutic drug level monitoring: Secondary | ICD-10-CM | POA: Insufficient documentation

## 2024-06-10 DIAGNOSIS — M5416 Radiculopathy, lumbar region: Secondary | ICD-10-CM | POA: Insufficient documentation

## 2024-06-10 MED ORDER — OXYCODONE HCL 5 MG PO TABS: 5.0000 mg | ORAL_TABLET | Freq: Three times a day (TID) | ORAL | 0 refills | Status: DC | PRN

## 2024-06-11 ENCOUNTER — Ambulatory Visit: Payer: Self-pay | Admitting: Medical

## 2024-06-11 ENCOUNTER — Ambulatory Visit (HOSPITAL_BASED_OUTPATIENT_CLINIC_OR_DEPARTMENT_OTHER)
Admission: RE | Admit: 2024-06-11 | Discharge: 2024-06-11 | Disposition: A | Discharge status: Home / Self Care | Source: Ambulatory Visit | Attending: Medical | Admitting: Medical

## 2024-06-11 DIAGNOSIS — M7989 Other specified soft tissue disorders: Secondary | ICD-10-CM | POA: Insufficient documentation

## 2024-06-11 NOTE — Patient Outreach (Signed)
 Complex Care Management   Visit Note  06/05/2024  Name:  Jill Spence MRN: [id number] DOB: 10/09/1951  Situation: Referral received for Complex Care Management related to Stress I obtained verbal consent from Patient.  Visit completed with pt  on the phone  Background:   Past Medical History:  Diagnosis Date   Anemia    low iron   Anxiety    Arthritis    Bipolar disorder (HCC)    Cancer (HCC) 1973   COPD (chronic obstructive pulmonary disease) (HCC)    Family history of adverse reaction to anesthesia    mom was put to sleep and she never woke up -    GERD (gastroesophageal reflux disease)    Guillain Barr syndrome (HCC)    numbness in toes, legs hurt   Headache    migraines as a teenager   IBS (irritable bowel syndrome)    Lump, breast    removed years ago per Pt.   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, has quiet' seizures   Thyroid  disease     Assessment: Patient Reported Symptoms:  Cognitive Cognitive Status: No symptoms reported, Alert and oriented to person, place, and time Cognitive/Intellectual Conditions Management [RPT]: None reported or documented in medical history or problem list      Neurological Neurological Review of Symptoms: Not assessed    HEENT HEENT Symptoms Reported: Not assessed      Cardiovascular      Respiratory Respiratory Symptoms Reported: Not assesed    Endocrine Endocrine Symptoms Reported: Not assessed    Gastrointestinal Gastrointestinal Symptoms Reported: Not assessed      Genitourinary Genitourinary Symptoms Reported: Not assessed    Integumentary Integumentary Symptoms Reported: Not assessed    Musculoskeletal Musculoskelatal Symptoms Reviewed: Not assessed        Psychosocial Psychosocial Symptoms Reported: Other Additional Psychological Details: Pt is trying to adjust to change of another aid to assist with ADLs in the home. Pt processed  negative experiences with previous aids with LCSW, who provided validation and encouragement. Healthy coping skills discussed Behavioral Management Strategies: Coping strategies Major Change/Loss/Stressor/Fears (CP): Medical condition, self        06/10/2024    1:38 PM  Depression screen PHQ 2/9  Decreased Interest 1  Down, Depressed, Hopeless 1  PHQ - 2 Score 2    There were no vitals filed for this visit.  Medications Reviewed Today     Reviewed by Ezzard Rolin BIRCH, LCSW (Social Worker) on 06/11/24 at 0820  Med List Status: <None>   Medication Order Taking? Sig Documenting Provider Last Dose Status Informant  albuterol  (PROVENTIL ) (2.5 MG/3ML) 0.083% nebulizer solution [id number]  Take 3 mLs (2.5 mg total) by nebulization every 6 (six) hours as needed for wheezing. Saguier, [place], PA-C  Active   albuterol  (VENTOLIN  HFA) 108 (90 Base) MCG/ACT inhaler [id number]  Inhale 2 puffs into the lungs every 6 (six) hours as needed for wheezing. Saguier, Edward, PA-C  Active   alclomethasone (ACLOVATE) 0.05 % cream [id number]  Apply topically 2 (two) times daily as needed. [provider]  Active   alendronate (FOSAMAX) 70 MG tablet [id number]  Take 70 mg by mouth every Sunday. Take with a full glass of water on an empty stomach. [provider]  Active Self  ARIPiprazole (ABILIFY) 15 MG tablet [id number]  Take 15 mg by mouth daily. [provider]  Active Self  azithromycin  (ZITHROMAX  Z-PAK) 250 MG tablet [id number]  Per zpack instructions Hope Almarie ORN, NP  Active   B Complex Vitamins (VITAMIN B COMPLEX ) TABS [id number]  Take 1 tablet by mouth daily. Mannam, Praveen, MD  Active   benzonatate  (TESSALON ) 100 MG capsule [id number]  TAKE ONE CAPSULE BY MOUTH THREE TIMES DAILY AS NEEDED FOR COUGH Saguier, [place], PA-C  Active   benzonatate  (TESSALON ) 100 MG capsule [id number]  Take 1 capsule (100 mg total) by mouth 3 (three) times daily as needed for cough. Saguier, [place], PA-C   Active   benztropine (COGENTIN) 0.5 MG tablet [id number]  Take 0.5 mg by mouth 2 (two) times daily. [provider]  Active Self  Bepotastine  Besilate (BEPREVE) 1.5 % SOLN [id number]  Place 1 drop into both eyes 2 (two) times daily as needed. Saguier, [place], PA-C  Active   Bepotastine  Besilate 1.5 % SOLN [id number]  Apply to eye. [provider]  Active   cromolyn (OPTICROM) 4 % ophthalmic solution [id number]  1 drop 4 (four) times daily. [provider]  Active   diclofenac  Sodium (VOLTAREN  ARTHRITIS PAIN) 1 % GEL [id number]  Apply 4 g topically 4 (four) times daily. Saguier, [place], PA-C  Active   dicyclomine  (BENTYL ) 20 MG tablet [id number]  Take 1 tablet (20 mg total) by mouth 3 (three) times daily as needed. Saguier, [place], PA-C  Active   doxycycline  (ADOXA) 100 MG tablet [id number]  Take 1 tablet by mouth daily. [provider]  Active   Ensifentrine  (OHTUVAYRE ) 3 MG/2.5ML SUSP [id number]  Inhale 1 ampule into the lungs in the morning and at bedtime. Hope Almarie ORN, NP  Active   erythromycin ophthalmic ointment [id number]  Apply to eye. [provider]  Active   famciclovir  (FAMVIR ) 250 MG tablet [id number]  Take 1 tablet (250 mg total) by mouth 2 (two) times daily. Saguier, [place], PA-C  Active   Fluticasone-Umeclidin-Vilant (TRELEGY ELLIPTA ) 200-62.5-25 MCG/ACT AEPB [id number]  Inhale 1 puff into the lungs daily. Saguier, [place], PA-C  Active   gabapentin  (NEURONTIN ) 400 MG capsule [id number]  Take 3 capsules (1,200 mg total) by mouth 3 (three) times daily. Saguier, Dallas, PA-C  Active   hydrOXYzine  (ATARAX ) 10 MG tablet [id number]  Take 1-2 tablets (10-20 mg total) by mouth at bedtime as needed for anxiety (insomnia). Saguier, Dallas, PA-C  Active   ibuprofen  (ADVIL ) 200 MG tablet [id number]  1-4 tab po every 8 hours prn moderate pain Saguier, Dallas, PA-C  Active   ipratropium-albuterol  (DUONEB) 0.5-2.5 (3) MG/3ML SOLN [id number]  Take 3 mLs by  nebulization every 4 (four) hours as needed. Saguier, Dallas, PA-C  Active   levocetirizine (XYZAL ) 5 MG tablet [id number]  Take 1 tablet (5 mg total) by mouth every evening. Saguier, Dallas, PA-C  Active   levocetirizine (XYZAL ) 5 MG tablet [id number]  Take 1 tablet (5 mg total) by mouth every evening. Saguier, Dallas, PA-C  Active   loperamide  (IMODIUM ) 2 MG capsule [id number]  TAKE ONE CAPSULE BY MOUTH EVERY 6 TO 8 HOURS AS NEEDED FOR DIARRHEA Saguier, Dallas, PA-C  Active   losartan  (COZAAR ) 25 MG tablet [id number]  TAKE 1 TABLET (25 MG TOTAL) BY MOUTH DAILY. Saguier, Dallas, PA-C  Active   magnesium oxide (MAG-OX) 400 MG tablet [id number]  Take 250 mg by mouth daily. [provider]  Active Self  methimazole  (TAPAZOLE ) 5 MG tablet [id number]  Take 1 tablet (5 mg total) by mouth daily. Saguier, Dallas, PA-C  Active  montelukast  (SINGULAIR ) 10 MG tablet [id number]  Take 1 tablet (10 mg total) by mouth at bedtime. Saguier, Dallas, PA-C  Active   mupirocin  ointment (BACTROBAN ) 2 % [id number]  Apply 1 Application topically 2 (two) times daily. Saguier, Edward, PA-C  Active   NONFORMULARY OR COMPOUNDED ITEM [id number]  Elevated toilet seat Saguier, Edward, PA-C  Active   olopatadine  (PATANOL) 0.1 % ophthalmic solution [id number]  Place 1 drop into both eyes 2 (two) times daily. Saguier, Dallas, PA-C  Active   olopatadine  (PATANOL) 0.1 % ophthalmic solution [id number]  Place 1 drop into both eyes 2 (two) times daily. Saguier, Dallas, PA-C  Active   oxcarbazepine (TRILEPTAL) 600 MG tablet [id number]  Take 600 mg by mouth 2 (two) times daily. [provider]  Active Self  oxyCODONE  (ROXICODONE ) 5 MG immediate release tablet [id number]  Take 1 tablet (5 mg total) by mouth 3 (three) times daily as needed for moderate pain (pain score 4-6). Debby Fidela CROME, NP  Active   pantoprazole  (PROTONIX ) 40 MG tablet [id number]  Take 1 tablet (40 mg total) by mouth daily. Saguier, [place], PA-C  Active   potassium  chloride SA (KLOR-CON ) 20 MEQ tablet [id number]  Take 1 tablet (20 mEq total) by mouth 2 (two) times daily for 7 days. Theadore Ozell HERO, MD  Expired 06/10/24 2359   predniSONE  (DELTASONE ) 10 MG tablet [id number]  Take 1 tablet (10 mg total) by mouth as needed. Mannam, Praveen, MD  Active   predniSONE  (DELTASONE ) 10 MG tablet [id number]  4 tabs for 2 days, then 3 tabs for 2 days, 2 tabs for 2 days, then 1 tab for 2 days, then stop Hope Almarie ORN, NP  Active   sodium chloride  HYPERTONIC 3 % nebulizer solution [id number]  3 ml via neb twice daily Mannam, Praveen, MD  Active   traZODone (DESYREL) 50 MG tablet [id number]  Take 50 mg by mouth at bedtime. [provider]  Active   triamcinolone  cream (KENALOG ) 0.1 % [id number]  Apply topically 2 (two) times daily as needed. Saguier, Dallas, PA-C  Active   triamcinolone  cream (KENALOG ) 0.1 % [id number]  Apply topically 2 (two) times daily. Saguier, Dallas, PA-C  Active   Vitamin D , Ergocalciferol , (DRISDOL ) 1.25 MG (50000 UNIT) CAPS capsule [id number]  TAKE ONE CAPSULE BY MOUTH ONCE WEEKLY FOR SIX WEEKS. Webb, Padonda B, FNP  Active   Vitamin D , Ergocalciferol , (DRISDOL ) 1.25 MG (50000 UNIT) CAPS capsule [id number]  Take 1 capsule (50,000 Units total) by mouth every 7 (seven) days. Saguier, Dallas, PA-C  Active   Vitamin D , Ergocalciferol , (DRISDOL ) 1.25 MG (50000 UNIT) CAPS capsule [id number]  1 capsule per month Saguier, [place], [place]  Active   Med List Note (Conte, Chantha G 02/08/24 1350): Patient is asking for medication to be refilled today, patient is in pain.             Recommendation:   Continue Current Plan of Care  Follow Up Plan:   Telephone follow-up in 1 month  Rolin Kerns, LCSW [facility]  [facility] Clinical Social Worker Direct Dial: [phone]  Fax: [phone] Website: [email or web address] 8:27 AM

## 2024-06-11 NOTE — Patient Instructions (Signed)
 Visit Information  Thank you for taking time to visit with me today. Please don't hesitate to contact me if I can be of assistance to you before our next scheduled appointment.  Your next care management appointment is by telephone on 07/17 at 1 PM  Please call the care guide team at [phone] if you need to cancel, schedule, or reschedule an appointment.   Please call the Suicide and Crisis Lifeline: 988 go to [facility] Urgent [facility] [address] [place] [phone]) call 911 if you are experiencing a Mental Health or Behavioral Health Crisis or need someone to talk to.  Rolin Kerns, LCSW [place]  [facility] Clinical Social Worker Direct Dial: [phone]  Fax: [phone] Website: [email or web address] 8:27 AM

## 2024-06-16 DIAGNOSIS — K219 Gastro-esophageal reflux disease without esophagitis: Secondary | ICD-10-CM | POA: Diagnosis not present

## 2024-06-16 DIAGNOSIS — G610 Guillain-Barre syndrome: Secondary | ICD-10-CM | POA: Diagnosis not present

## 2024-06-17 ENCOUNTER — Encounter: Payer: Self-pay | Admitting: Internal Medicine

## 2024-06-17 ENCOUNTER — Ambulatory Visit (INDEPENDENT_AMBULATORY_CARE_PROVIDER_SITE_OTHER): Admitting: Internal Medicine

## 2024-06-17 VITALS — BP 122/68 | HR 70 | Ht 63.5 in | Wt 165.2 lb

## 2024-06-17 DIAGNOSIS — J9611 Chronic respiratory failure with hypoxia: Secondary | ICD-10-CM | POA: Diagnosis not present

## 2024-06-17 DIAGNOSIS — Z87891 Personal history of nicotine dependence: Secondary | ICD-10-CM | POA: Diagnosis not present

## 2024-06-17 DIAGNOSIS — R911 Solitary pulmonary nodule: Secondary | ICD-10-CM | POA: Diagnosis not present

## 2024-06-17 DIAGNOSIS — J432 Centrilobular emphysema: Secondary | ICD-10-CM

## 2024-06-17 DIAGNOSIS — J439 Emphysema, unspecified: Secondary | ICD-10-CM | POA: Diagnosis not present

## 2024-06-17 MED ORDER — ALBUTEROL SULFATE (2.5 MG/3ML) 0.083% IN NEBU: 2.5000 mg | INHALATION_SOLUTION | Freq: Four times a day (QID) | RESPIRATORY_TRACT | 2 refills | Status: AC | PRN

## 2024-06-17 MED ORDER — ALBUTEROL SULFATE HFA 108 (90 BASE) MCG/ACT IN AERS: 2.0000 | INHALATION_SPRAY | Freq: Four times a day (QID) | RESPIRATORY_TRACT | 11 refills | Status: AC | PRN

## 2024-06-17 NOTE — Patient Instructions (Signed)
 It was a pleasure to see you today!  Please schedule follow up with APP in 2 months.  If my schedule is not open yet, we will contact you with a reminder closer to that time. Please call [phone] if you haven't heard from us  a month before, and always call us  sooner if issues or concerns arise. You can also send us  a message through MyChart, but but aware that this is not to be used for urgent issues and it may take up to 5-7 days to receive a reply. Please be aware that you will likely be able to view your results before I have a chance to respond to them. Please give us  5 business days to respond to any non-urgent results.    VISIT SUMMARY:  During today's visit, we discussed your worsening shortness of breath and choking sensations. We reviewed your current medications and oxygen  therapy, and we talked about your lung nodules and other health concerns.  YOUR PLAN:  -SEVERE EMPHYSEMA: Pulmonary fibrosis and emphysema are chronic lung conditions that cause difficulty breathing. We will continue your current medications, including the Trelegy inhaler once daily and albuterol  nebulizer as needed. Please follow up with the pharmacy team regarding the approval of Ohtuvayre . Continue with pulmonary rehabilitation when you are able, and use a water-based lubricant for nasal dryness.  -LUNG NODULES: Lung nodules are small growths in the lungs that are often benign. We will continue to monitor these nodules as they currently show a low likelihood of being cancerous.  INSTRUCTIONS:  Please follow up with the pharmacy team regarding the approval of Ohtuvayre . Continue with pulmonary rehabilitation when you are able.

## 2024-06-17 NOTE — Progress Notes (Signed)
 Jill Spence    [id number]    Feb 07, 1951  Primary Care Physician:Saguier, Dallas RIGGERS Date of Appointment: 06/17/2024 Established Patient Visit  Chief complaint:   Chief Complaint  Patient presents with   Follow-up     HPI: Jill Spence is a 73 y.o. woman with severe copd and emphysema and chronic respiratory failure on 3LNC via POC.   Interval Updates: Discussed the use of AI scribe software for clinical note transcription with the patient, who gave verbal consent to proceed.  History of Present Illness Jill Spence is a 73 year old female with primary fibrosis and emphysema who presents with worsening shortness of breath and choking sensations.  She experiences worsening shortness of breath both at rest and with exertion, leading to significant breathlessness even with minimal activities such as walking from one room to another or washing dishes. She begins her daily activities early to avoid feeling rushed due to her breathing difficulties. Episodes are accompanied by chest tightness and a sensation of wanting to pass out.  She describes a choking sensation that occurs mostly when eating, drinking, or lying down. No reflux or heartburn is noted. She uses oxygen  therapy at home, typically at two liters, but increasing the flow results in headaches. Oxygen  saturation levels vary depending on her activity and location within her home.  She reports having fibrosis, emphysema, and lung nodules that have been monitored over time. She uses Trelegy inhaler once daily and has albuterol  nebulizer therapy and a rescue inhaler, though these do not significantly alleviate her symptoms. She has not received the Otovel nebulizer treatment that was previously prescribed.  There is a family history of emphysema, with both her sister and uncle affected by the condition. She quit smoking over fifteen years ago and attributes some of her respiratory issues to environmental changes  after moving from [place]  to the [place].  She reports having a Baker's cyst in her left leg, identified through a recent ultrasound, and mentions a small blood clot that is being monitored. She experiences swelling in her legs and has a history of arthritis in her knee.  She had to stop pulmonary rehabilitation recently due to a respiratory infection and notes that central air conditioning exacerbates her symptoms by causing dryness and irritation in her throat and nose.    I have reviewed the patient's family social and past medical history and updated as appropriate.   Past Medical History:  Diagnosis Date   Anemia    low iron   Anxiety    Arthritis    Bipolar disorder (HCC)    Cancer (HCC) 1973   COPD (chronic obstructive pulmonary disease) (HCC)    Family history of adverse reaction to anesthesia    mom was put to sleep and she never woke up -    GERD (gastroesophageal reflux disease)    Guillain Barr syndrome (HCC)    numbness in toes, legs hurt   Headache    migraines as a teenager   IBS (irritable bowel syndrome)    Lump, breast    removed years ago per Pt.   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, has quiet' seizures   Thyroid  disease     Past Surgical History:  Procedure Laterality Date   BREAST SURGERY     COLONOSCOPY     EYE SURGERY  Bilateral    cataract surgery with lens implants   MANDIBLE RECONSTRUCTION     MINOR REMOVAL OF MANDIBULAR HARDWARE Right 10/15/2015   Procedure: MINOR REMOVAL OF Right MANDIBULAR HARDWARE;  Surgeon: Glendia Primrose, DDS;  Location: [facility] OR;  Service: Oral Surgery;  Laterality: Right;   TONSILLECTOMY      Family History  Problem Relation Age of Onset   Cancer Father    Emphysema Sister     Social History   Occupational History   Not on file  Tobacco Use   Smoking status: Former    Current packs/day: 0.00    Average packs/day: 0.3 packs/day for  6.0 years (1.5 ttl pk-yrs)    Types: Cigarettes    Start date: 10/13/2001    Quit date: 10/14/2007    Years since quitting: 16.6   Smokeless tobacco: Never  Vaping Use   Vaping status: Never Used  Substance and Sexual Activity   Alcohol use: No   Drug use: No   Sexual activity: Not on file     Physical Exam: Blood pressure 122/68, pulse 70, height 5' 3.5 (1.613 m), weight 165 lb 3.2 oz (74.9 kg), SpO2 100%.  Gen:      No acute distress ENT:  on nasal cannula, no nasal polyps, mucus membranes moist Lungs:    Diminished, No increased respiratory effort, symmetric chest wall excursion, clear to auscultation bilaterally, no wheezes or crackles CV:         Regular rate and rhythm; no murmurs, rubs, or gallops.  No pedal edema   Data Reviewed: Imaging: I have personally reviewed the CT Chest March 2024 - severe emphysema  PFTs:     Latest Ref Rng & Units 06/06/2023   11:56 AM 12/28/2020   10:17 AM  PFT Results  FVC-Pre L 1.70  1.84   FVC-Predicted Pre % 60  80   FVC-Post L 1.62  1.68   FVC-Predicted Post % 56  73   Pre FEV1/FVC % % 41  42   Post FEV1/FCV % % 38  43   FEV1-Pre L 0.70  0.78   FEV1-Predicted Pre % 32  44   FEV1-Post L 0.61  0.72   DLCO uncorrected ml/min/mmHg 4.21  5.09   DLCO UNC% % 22  26   DLCO corrected ml/min/mmHg 4.10  5.09   DLCO COR %Predicted % 21  26   DLVA Predicted % 33  40   TLC L 6.92  6.19   TLC % Predicted % 141  126   RV % Predicted % 232  190    I have personally reviewed the patient's PFTs and Severe airflow limitation FEV1 32%  Labs: Lab Results  Component Value Date   NA 139 04/25/2024   K 4.3 04/25/2024   CO2 28 04/25/2024   GLUCOSE 89 04/25/2024   BUN 17 04/25/2024   CREATININE 0.59 (L) 04/25/2024   CALCIUM 9.5 04/25/2024   GFR 89.80 01/02/2024   EGFR 96 04/25/2024   GFRNONAA >60 02/03/2020   Lab Results  Component Value Date   WBC 8.2 01/02/2024   HGB 13.2 01/02/2024   HCT 40.0 01/02/2024   MCV 97.1 01/02/2024    PLT 300.0 01/02/2024    Immunization status:  There is no immunization history on file for this patient.  External Records Personally Reviewed: pulmonary, pcp  Assessment and Plan Assessment & Plan Severe emphysema FEV1 32% of predicted Chronic emphysema with worsening dyspnea and choking sensation. Variable oxygen  saturation levels. Minimal  relief from current medications. - Continue Trelegy inhaler once daily. - Use albuterol  nebulizer as needed for dyspnea. - Follow up with pharmacy team regarding Ohtuvayre  approval. - Encourage continuation of pulmonary rehabilitation when able.   Chronic respiratory failure - continue home oxygen  goal sats over 88% - Advise use of water-based lubricant for nasal dryness.  Lung nodules Waxing/waning, likely infectious/inflammatory - Continue monitoring.   Return to Care: Return in about 2 months (around 08/18/2024) for with Landry Ferrari.   Verdon Gore, MD Pulmonary and Critical Care Medicine [facility] Office:[phone]

## 2024-06-18 DIAGNOSIS — R0902 Hypoxemia: Secondary | ICD-10-CM | POA: Diagnosis not present

## 2024-06-19 ENCOUNTER — Telehealth: Payer: Self-pay | Admitting: Licensed Clinical Social Worker

## 2024-06-19 ENCOUNTER — Ambulatory Visit (INDEPENDENT_AMBULATORY_CARE_PROVIDER_SITE_OTHER)

## 2024-06-19 DIAGNOSIS — Z0000 Encounter for general adult medical examination without abnormal findings: Secondary | ICD-10-CM

## 2024-06-19 NOTE — Patient Instructions (Signed)
 Jill Spence , Thank you for taking time out of your busy schedule to complete your Annual Wellness Visit with me. I enjoyed our conversation and look forward to speaking with you again next year. I, as well as your care team,  appreciate your ongoing commitment to your health goals. Please review the following plan we discussed and let me know if I can assist you in the future. Your Game plan/ To Do List    Referrals: If you haven't heard from the office you've been referred to, please reach out to them at the phone provided.  N/a Follow up Visits: Next Medicare AWV with our clinical staff: 06/30/2025 at 3:50   Have you seen your provider in the last 6 months (3 months if uncontrolled diabetes)? Yes Next Office Visit with your provider: 07/01/2024 at 2:20  Clinician Recommendations:  Aim for 30 minutes of exercise or brisk walking, 6-8 glasses of water, and 5 servings of fruits and vegetables each day.       This is a list of the screening recommended for you and due dates:  Health Maintenance  Topic Date Due   Pneumococcal Vaccine for age over 37 (1 of 2 - PCV) Never done   DEXA scan (bone density measurement)  Never done   Hepatitis C Screening  07/06/2024*   Medicare Annual Wellness Visit  06/19/2025   Mammogram  07/23/2025   Colon Cancer Screening  03/04/2030   Hepatitis B Vaccine  Aged Out   HPV Vaccine  Aged Out   Meningitis B Vaccine  Aged Out   DTaP/Tdap/Td vaccine  Discontinued   COVID-19 Vaccine  Discontinued   Zoster (Shingles) Vaccine  Discontinued  *Topic was postponed. The date shown is not the original due date.    Advanced directives: (Copy Requested) Please bring a copy of your health care power of attorney and living will to the office to be added to your chart at your convenience. You can mail to [facility] [address] [place], [place] [address] or email to [email or web address] Advance Care Planning is important because it:  [x]  Makes  sure you receive the medical care that is consistent with your values, goals, and preferences  [x]  It provides guidance to your family and loved ones and reduces their decisional burden about whether or not they are making the right decisions based on your wishes.  Follow the link provided in your after visit summary or read over the paperwork we have mailed to you to help you started getting your Advance Directives in place. If you need assistance in completing these, please reach out to us  so that we can help you!  See attachments for Preventive Care and Fall Prevention Tips.

## 2024-06-19 NOTE — Progress Notes (Signed)
 Subjective:   Jill Spence is a 73 y.o. who presents for a Medicare Wellness preventive visit.  As a reminder, Annual Wellness Visits don't include a physical exam, and some assessments may be limited, especially if this visit is performed virtually. We may recommend an in-person follow-up visit with your provider if needed.  Visit Complete: Virtual I connected with  Jill Spence on 06/19/24 by a audio enabled telemedicine application and verified that I am speaking with the correct person using two identifiers.  Patient Location: Home  Provider Location: Home Office  I discussed the limitations of evaluation and management by telemedicine. The patient expressed understanding and agreed to proceed.  Vital Signs: Because this visit was a virtual/telehealth visit, some criteria may be missing or patient reported. Any vitals not documented were not able to be obtained and vitals that have been documented are patient reported.  VideoError- Librarian, academic were attempted between this provider and patient, however failed, due to patient having technical difficulties OR patient did not have access to video capability.  We continued and completed visit with audio only.   Persons Participating in Visit: Patient.  AWV Questionnaire: No: Patient Medicare AWV questionnaire was not completed prior to this visit.  Cardiac Risk Factors include: advanced age (>20men, >69 women)     Objective:    Today's Vitals   06/19/24 1548  PainSc: 9    There is no height or weight on file to calculate BMI.     06/19/2024    4:08 PM 05/02/2023    3:51 PM 10/15/2015    7:00 AM  Advanced Directives  Does Patient Have a Medical Advance Directive? Yes Yes Yes   Type of Estate agent of [employer] Power of [place];Living will Living will;Healthcare Power of Attorney   Copy of Healthcare Power of Attorney in Chart? No - copy requested No - copy  requested No - copy requested      Data saved with a previous flowsheet row definition    Current Medications (verified) Outpatient Encounter Medications as of 06/19/2024  Medication Sig   albuterol  (PROVENTIL ) (2.5 MG/3ML) 0.083% nebulizer solution Take 3 mLs (2.5 mg total) by nebulization every 6 (six) hours as needed for wheezing.   albuterol  (VENTOLIN  HFA) 108 (90 Base) MCG/ACT inhaler Inhale 2 puffs into the lungs every 6 (six) hours as needed for wheezing.   alclomethasone (ACLOVATE) 0.05 % cream Apply topically 2 (two) times daily as needed.   alendronate (FOSAMAX) 70 MG tablet Take 70 mg by mouth every Sunday. Take with a full glass of water on an empty stomach.   ARIPiprazole (ABILIFY) 15 MG tablet Take 15 mg by mouth daily.   B Complex Vitamins (VITAMIN B COMPLEX ) TABS Take 1 tablet by mouth daily.   benzonatate  (TESSALON ) 100 MG capsule TAKE ONE CAPSULE BY MOUTH THREE TIMES DAILY AS NEEDED FOR COUGH   benzonatate  (TESSALON ) 100 MG capsule Take 1 capsule (100 mg total) by mouth 3 (three) times daily as needed for cough.   benztropine (COGENTIN) 0.5 MG tablet Take 0.5 mg by mouth 2 (two) times daily.   Bepotastine  Besilate (BEPREVE) 1.5 % SOLN Place 1 drop into both eyes 2 (two) times daily as needed.   Bepotastine  Besilate 1.5 % SOLN Apply to eye.   cromolyn (OPTICROM) 4 % ophthalmic solution 1 drop 4 (four) times daily.   diclofenac  Sodium (VOLTAREN  ARTHRITIS PAIN) 1 % GEL Apply 4 g topically 4 (four) times daily.  dicyclomine  (BENTYL ) 20 MG tablet Take 1 tablet (20 mg total) by mouth 3 (three) times daily as needed.   Ensifentrine  (OHTUVAYRE ) 3 MG/2.5ML SUSP Inhale 1 ampule into the lungs in the morning and at bedtime.   erythromycin ophthalmic ointment Apply to eye.   famciclovir  (FAMVIR ) 250 MG tablet Take 1 tablet (250 mg total) by mouth 2 (two) times daily.   Fluticasone-Umeclidin-Vilant (TRELEGY ELLIPTA ) 200-62.5-25 MCG/ACT AEPB Inhale 1 puff into the lungs daily.    gabapentin  (NEURONTIN ) 400 MG capsule Take 3 capsules (1,200 mg total) by mouth 3 (three) times daily.   hydrOXYzine  (ATARAX ) 10 MG tablet Take 1-2 tablets (10-20 mg total) by mouth at bedtime as needed for anxiety (insomnia).   ibuprofen  (ADVIL ) 200 MG tablet 1-4 tab po every 8 hours prn moderate pain   ipratropium-albuterol  (DUONEB) 0.5-2.5 (3) MG/3ML SOLN Take 3 mLs by nebulization every 4 (four) hours as needed.   levocetirizine (XYZAL ) 5 MG tablet Take 1 tablet (5 mg total) by mouth every evening.   loperamide  (IMODIUM ) 2 MG capsule TAKE ONE CAPSULE BY MOUTH EVERY 6 TO 8 HOURS AS NEEDED FOR DIARRHEA   losartan  (COZAAR ) 25 MG tablet TAKE 1 TABLET (25 MG TOTAL) BY MOUTH DAILY.   magnesium oxide (MAG-OX) 400 MG tablet Take 250 mg by mouth daily.   methimazole  (TAPAZOLE ) 5 MG tablet Take 1 tablet (5 mg total) by mouth daily.   montelukast  (SINGULAIR ) 10 MG tablet Take 1 tablet (10 mg total) by mouth at bedtime.   mupirocin  ointment (BACTROBAN ) 2 % Apply 1 Application topically 2 (two) times daily.   olopatadine  (PATANOL) 0.1 % ophthalmic solution Place 1 drop into both eyes 2 (two) times daily.   oxcarbazepine (TRILEPTAL) 600 MG tablet Take 600 mg by mouth 2 (two) times daily.   oxyCODONE  (ROXICODONE ) 5 MG immediate release tablet Take 1 tablet (5 mg total) by mouth 3 (three) times daily as needed for moderate pain (pain score 4-6).   pantoprazole  (PROTONIX ) 40 MG tablet Take 1 tablet (40 mg total) by mouth daily.   potassium chloride  SA (KLOR-CON ) 20 MEQ tablet Take 1 tablet (20 mEq total) by mouth 2 (two) times daily for 7 days.   predniSONE  (DELTASONE ) 10 MG tablet Take 1 tablet (10 mg total) by mouth as needed.   sodium chloride  HYPERTONIC 3 % nebulizer solution 3 ml via neb twice daily   triamcinolone  cream (KENALOG ) 0.1 % Apply topically 2 (two) times daily as needed.   Vitamin D , Ergocalciferol , (DRISDOL ) 1.25 MG (50000 UNIT) CAPS capsule TAKE ONE CAPSULE BY MOUTH ONCE WEEKLY FOR SIX  WEEKS.   azithromycin  (ZITHROMAX  Z-PAK) 250 MG tablet Per zpack instructions (Patient not taking: Reported on 06/19/2024)   doxycycline  (ADOXA) 100 MG tablet Take 1 tablet by mouth daily. (Patient not taking: Reported on 06/19/2024)   levocetirizine (XYZAL ) 5 MG tablet Take 1 tablet (5 mg total) by mouth every evening. (Patient not taking: Reported on 06/19/2024)   NONFORMULARY OR COMPOUNDED ITEM Elevated toilet seat   olopatadine  (PATANOL) 0.1 % ophthalmic solution Place 1 drop into both eyes 2 (two) times daily. (Patient not taking: Reported on 06/19/2024)   predniSONE  (DELTASONE ) 10 MG tablet 4 tabs for 2 days, then 3 tabs for 2 days, 2 tabs for 2 days, then 1 tab for 2 days, then stop (Patient not taking: Reported on 06/19/2024)   traZODone (DESYREL) 50 MG tablet Take 50 mg by mouth at bedtime. (Patient not taking: Reported on 06/19/2024)   triamcinolone  cream (KENALOG )  0.1 % Apply topically 2 (two) times daily. (Patient not taking: Reported on 06/19/2024)   Vitamin D , Ergocalciferol , (DRISDOL ) 1.25 MG (50000 UNIT) CAPS capsule Take 1 capsule (50,000 Units total) by mouth every 7 (seven) days. (Patient not taking: Reported on 06/19/2024)   Vitamin D , Ergocalciferol , (DRISDOL ) 1.25 MG (50000 UNIT) CAPS capsule 1 capsule per month (Patient not taking: Reported on 06/19/2024)   No facility-administered encounter medications on file as of 06/19/2024.    Allergies (verified) Influenza vaccines and Aspirin   History: Past Medical History:  Diagnosis Date   Anemia    low iron   Anxiety    Arthritis    Bipolar disorder (HCC)    Cancer (HCC) 1973   COPD (chronic obstructive pulmonary disease) (HCC)    Family history of adverse reaction to anesthesia    mom was put to sleep and she never woke up -    GERD (gastroesophageal reflux disease)    Guillain Barr syndrome (HCC)    numbness in toes, legs hurt   Headache    migraines as a teenager   IBS (irritable bowel syndrome)    Lump, breast     removed years ago per Pt.   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, has quiet' seizures   Thyroid  disease    Past Surgical History:  Procedure Laterality Date   BREAST SURGERY     COLONOSCOPY     EYE SURGERY Bilateral    cataract surgery with lens implants   MANDIBLE RECONSTRUCTION     MINOR REMOVAL OF MANDIBULAR HARDWARE Right 10/15/2015   Procedure: MINOR REMOVAL OF Right MANDIBULAR HARDWARE;  Surgeon: Glendia Primrose, DDS;  Location: [facility] OR;  Service: Oral Surgery;  Laterality: Right;   TONSILLECTOMY     Family History  Problem Relation Age of Onset   Cancer Father    Emphysema Sister    Social History   Socioeconomic History   Marital status: Widowed    Spouse name: Not on file   Number of children: Not on file   Years of education: Not on file   Highest education level: Some college, no degree  Occupational History   Not on file  Tobacco Use   Smoking status: Former    Current packs/day: 0.00    Average packs/day: 0.3 packs/day for 6.0 years (1.5 ttl pk-yrs)    Types: Cigarettes    Start date: 10/13/2001    Quit date: 10/14/2007    Years since quitting: 16.6   Smokeless tobacco: Never  Vaping Use   Vaping status: Never Used  Substance and Sexual Activity   Alcohol use: No   Drug use: No   Sexual activity: Not on file  Other Topics Concern   Not on file  Social History Narrative   Not on file   Social Drivers of Health   Financial Resource Strain: Low Risk  (06/19/2024)   Overall Financial Resource Strain (CARDIA)    Difficulty of Paying Living Expenses: Not hard at all  Food Insecurity: No Food Insecurity (06/19/2024)   Hunger Vital Sign    Worried About Running Out of Food in the Last Year: Never true    Ran Out of Food in the Last Year: Never true  Transportation Needs: No Transportation Needs (06/19/2024)   PRAPARE - Transportation    Lack of Transportation (Medical): No    Lack  of  Transportation (Non-Medical): No  Physical Activity: Inactive (06/19/2024)   Exercise Vital Sign    Days of Exercise per Week: 0 days    Minutes of Exercise per Session: 0 min  Stress: Stress Concern Present (06/19/2024)   [employer] of Occupational Health - Occupational Stress Questionnaire    Feeling of Stress: Rather much  Social Connections: Socially Isolated (06/19/2024)   Social Connection and Isolation Panel    Frequency of Communication with Friends and Family: More than three times a week    Frequency of Social Gatherings with Friends and Family: Once a week    Attends Religious Services: Never    Database administrator or Organizations: No    Attends Banker Meetings: Never    Marital Status: Widowed    Tobacco Counseling Counseling given: Not Answered    Clinical Intake:  Pre-visit preparation completed: Yes  Pain : 0-10 Pain Score: 9  Pain Type: Chronic pain Pain Location: Leg Pain Orientation: Left, Right Pain Descriptors / Indicators: Aching Pain Onset: More than a month ago Pain Frequency: Constant     Nutritional Risks: Nausea/ vomitting/ diarrhea (nausea, vomiting and diarrhea yesterday has IBS) Diabetes: No  No results found for: HGBA1C   How often do you need to have someone help you when you read instructions, pamphlets, or other written materials from your doctor or pharmacy?: 1 - Never  Interpreter Needed?: No  Information entered by :: NAllen LPN   Activities of Daily Living     06/19/2024    3:53 PM  In your present state of health, do you have any difficulty performing the following activities:  Hearing? 0  Vision? 1  Comment glaucoma  Difficulty concentrating or making decisions? 0  Walking or climbing stairs? 1  Dressing or bathing? 1  Doing errands, shopping? 1  Preparing Food and eating ? N  Using the Toilet? N  In the past six months, have you accidently leaked urine? N  Do you have problems with loss  of bowel control? N  Managing your Medications? N  Managing your Finances? N  Housekeeping or managing your Housekeeping? Y    Patient Care Team: Saguier, Edward, PA-C as PCP - General (Internal Medicine) Murriel Barb, MD as Referring Physician (Gastroenterology) Lewis, Jasmine D, LCSW as [facility] Care Management (Licensed Clinical Social Worker) Lewis, Jasmine D, LCSW (Licensed Clinical Social Worker)  I have updated your Care Teams any recent Medical Services you may have received from other providers in the past year.     Assessment:   This is a routine wellness examination for Jill Spence.  Hearing/Vision screen Hearing Screening - Comments:: Denies hearing issues Vision Screening - Comments:: Regular eye exams,    Goals Addressed             This Visit's Progress    Patient Stated       06/19/2024, keep self going       Depression Screen     06/19/2024    4:12 PM 06/10/2024    1:38 PM 04/14/2024    1:32 PM 02/11/2024    1:33 PM 05/02/2023    3:50 PM 04/19/2023    1:43 PM 02/20/2023    1:54 PM  PHQ 2/9 Scores  PHQ - 2 Score 2 2 0 0 0 0 0  PHQ- 9 Score 8          Fall Risk     06/19/2024    4:09 PM 06/17/2024    1:51 PM  06/10/2024    1:38 PM 05/15/2024   12:13 PM 04/14/2024    1:32 PM  Fall Risk   Falls in the past year? 0 0 0  0  Number falls in past yr: 0  0    Injury with Fall? 0  0    Risk for fall due to : Medication side effect;Impaired mobility;Impaired balance/gait      Follow up Falls evaluation completed;Falls prevention discussed   Falls prevention discussed     MEDICARE RISK AT HOME:  Medicare Risk at Home Any stairs in or around the home?: No If so, are there any without handrails?: No Home free of loose throw rugs in walkways, pet beds, electrical cords, etc?: Yes Adequate lighting in your home to reduce risk of falls?: Yes Use of a cane, walker or w/c?: Yes Grab bars in the bathroom?: Yes Shower chair or bench in shower?: Yes Elevated toilet seat  or a handicapped toilet?: No  TIMED UP AND GO:  Was the test performed?  No  Cognitive Function: 6CIT completed    05/02/2023    3:54 PM  MMSE - Mini Mental State Exam  Not completed: Unable to complete        06/19/2024    4:14 PM  6CIT Screen  What Year? 0 points  What month? 0 points  What time? 0 points  Count back from 20 0 points  Months in reverse 0 points  Repeat phrase 10 points  Total Score 10 points    Immunizations  There is no immunization history on file for this patient.  Screening Tests Health Maintenance  Topic Date Due   Pneumococcal Vaccine: 50+ Years (1 of 2 - PCV) Never done   DEXA SCAN  Never done   Hepatitis C Screening  07/06/2024 (Originally 10/15/1969)   Medicare Annual Wellness (AWV)  06/19/2025   MAMMOGRAM  07/23/2025   Colonoscopy  03/04/2030   Hepatitis B Vaccines  Aged Out   HPV VACCINES  Aged Out   Meningococcal B Vaccine  Aged Out   DTaP/Tdap/Td  Discontinued   COVID-19 Vaccine  Discontinued   Zoster Vaccines- Shingrix  Discontinued    Health Maintenance  Health Maintenance Due  Topic Date Due   Pneumococcal Vaccine: 50+ Years (1 of 2 - PCV) Never done   DEXA SCAN  Never done   Health Maintenance Items Addressed: Can't have vaccines due Guillian Barre. Will discuss DEXA with PCP.  Additional Screening:  Vision Screening: Recommended annual ophthalmology exams for early detection of glaucoma and other disorders of the eye. Would you like a referral to an eye doctor? No    Dental Screening: Recommended annual dental exams for proper oral hygiene  Community Resource Referral / Chronic Care Management: CRR required this visit?  No   CCM required this visit?  No   Plan:    I have personally reviewed and noted the following in the patient's chart:   Medical and social history Use of alcohol, tobacco or illicit drugs  Current medications and supplements including opioid prescriptions. Patient is currently taking  opioid prescriptions. Information provided to patient regarding non-opioid alternatives. Patient advised to discuss non-opioid treatment plan with their provider. Functional ability and status Nutritional status Physical activity Advanced directives List of other physicians Hospitalizations, surgeries, and ER visits in previous 12 months Vitals Screenings to include cognitive, depression, and falls Referrals and appointments  In addition, I have reviewed and discussed with patient certain preventive protocols, quality metrics, and best  practice recommendations. A written personalized care plan for preventive services as well as general preventive health recommendations were provided to patient.   Jill Spence Dawn, LPN   2/82/7974   After Visit Summary: (MyChart) Due to this being a telephonic visit, the after visit summary with patients personalized plan was offered to patient via MyChart   Notes: Nothing significant to report at this time.

## 2024-06-20 ENCOUNTER — Other Ambulatory Visit: Payer: Self-pay | Admitting: Medical

## 2024-06-27 ENCOUNTER — Other Ambulatory Visit: Payer: Self-pay | Admitting: Licensed Clinical Social Worker

## 2024-06-27 NOTE — Patient Instructions (Signed)
 Visit Information  Thank you for taking time to visit with me today. Please don't hesitate to contact me if I can be of assistance to you before our next scheduled appointment.  Your next care management appointment is by telephone on 08/06 at 2 PM  Please call the care guide team at [phone] if you need to cancel, schedule, or reschedule an appointment.   Please call the Suicide and Crisis Lifeline: 988 call 911 if you are experiencing a Mental Health or Behavioral Health Crisis or need someone to talk to.  Rolin Kerns, LCSW [place]  [facility], [facility] Clinical Social Worker Direct Dial: [phone]  Fax: [phone] Website: [email or web address] 2:08 PM

## 2024-06-27 NOTE — Patient Outreach (Signed)
 Complex Care Management   Visit Note  06/27/2024  Name:  Jill Spence MRN: [id number] DOB: July 01, 1951  Situation: Referral received for Complex Care Management related to COPD I obtained verbal consent from Patient.  Visit completed with pt  on the phone  Background:   Past Medical History:  Diagnosis Date   Anemia    low iron   Anxiety    Arthritis    Bipolar disorder (HCC)    Cancer (HCC) 1973   COPD (chronic obstructive pulmonary disease) (HCC)    Family history of adverse reaction to anesthesia    mom was put to sleep and she never woke up -    GERD (gastroesophageal reflux disease)    Guillain Barr syndrome (HCC)    numbness in toes, legs hurt   Headache    migraines as a teenager   IBS (irritable bowel syndrome)    Lump, breast    removed years ago per Pt.   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, has quiet' seizures   Thyroid  disease     Assessment: Patient Reported Symptoms:  Cognitive Cognitive Status: Alert and oriented to person, place, and time, Normal speech and language skills Cognitive/Intellectual Conditions Management [RPT]: None reported or documented in medical history or problem list   Health Maintenance Behaviors: Annual physical exam  Neurological Neurological Review of Symptoms: Not assessed    HEENT HEENT Symptoms Reported: Not assessed      Cardiovascular Cardiovascular Symptoms Reported: Not assessed    Respiratory Respiratory Symptoms Reported: Not assesed    Endocrine Endocrine Symptoms Reported: Not assessed    Gastrointestinal Gastrointestinal Symptoms Reported: Not assessed      Genitourinary Genitourinary Symptoms Reported: Not assessed    Integumentary Integumentary Symptoms Reported: Not assessed    Musculoskeletal Musculoskelatal Symptoms Reviewed: Not assessed        Psychosocial Psychosocial Symptoms Reported: Anxiety - if selected  complete GAD Behavioral Management Strategies: Coping strategies, Adequate rest, Medication therapy, Support system Major Change/Loss/Stressor/Fears (CP): Medical condition, self        06/19/2024    4:12 PM  Depression screen PHQ 2/9  Decreased Interest 1  Down, Depressed, Hopeless 1  PHQ - 2 Score 2  Altered sleeping 0  Tired, decreased energy 3  Change in appetite 3  Feeling bad or failure about yourself  0  Trouble concentrating 0  Moving slowly or fidgety/restless 0  Suicidal thoughts 0  PHQ-9 Score 8  Difficult doing work/chores Somewhat difficult    There were no vitals filed for this visit.  Medications Reviewed Today   Medications were not reviewed in this encounter     Recommendation:   PCP Follow-up Continue Current Plan of Care  Follow Up Plan:   Telephone follow-up in 1 month  Rolin Kerns, LCSW [facility]  [facility], [facility] Clinical Social Worker Direct Dial: [phone]  Fax: [phone] Website: [email or web address] 2:03 PM

## 2024-07-01 ENCOUNTER — Other Ambulatory Visit (HOSPITAL_BASED_OUTPATIENT_CLINIC_OR_DEPARTMENT_OTHER): Payer: Self-pay

## 2024-07-01 ENCOUNTER — Ambulatory Visit (INDEPENDENT_AMBULATORY_CARE_PROVIDER_SITE_OTHER): Admitting: Medical

## 2024-07-01 VITALS — BP 118/78 | HR 74 | Ht 63.5 in

## 2024-07-01 DIAGNOSIS — M25561 Pain in right knee: Secondary | ICD-10-CM

## 2024-07-01 DIAGNOSIS — G8929 Other chronic pain: Secondary | ICD-10-CM

## 2024-07-01 DIAGNOSIS — M5432 Sciatica, left side: Secondary | ICD-10-CM | POA: Diagnosis not present

## 2024-07-01 DIAGNOSIS — J449 Chronic obstructive pulmonary disease, unspecified: Secondary | ICD-10-CM | POA: Diagnosis not present

## 2024-07-01 DIAGNOSIS — R059 Cough, unspecified: Secondary | ICD-10-CM

## 2024-07-01 DIAGNOSIS — M25562 Pain in left knee: Secondary | ICD-10-CM

## 2024-07-01 DIAGNOSIS — R944 Abnormal results of kidney function studies: Secondary | ICD-10-CM | POA: Diagnosis not present

## 2024-07-01 DIAGNOSIS — M79609 Pain in unspecified limb: Secondary | ICD-10-CM | POA: Diagnosis not present

## 2024-07-01 MED ORDER — AZITHROMYCIN 250 MG PO TABS
ORAL_TABLET | ORAL | 0 refills | Status: AC
  Filled 2024-07-01: qty 6, 5d supply, fill #0

## 2024-07-01 MED ORDER — IBUPROFEN 600 MG PO TABS
600.0000 mg | ORAL_TABLET | Freq: Two times a day (BID) | ORAL | 0 refills | Status: DC | PRN
  Filled 2024-07-01: qty 30, 15d supply, fill #0

## 2024-07-01 NOTE — Progress Notes (Signed)
 Subjective:    Patient ID: Jill Spence, female    DOB: July 21, 1951, 73 y.o.   MRN: [id number]  HPI Jill Spence is a 73 year old female with restless leg syndrome and COPD who presents with left leg pain and back pain.  She experiences significant pain in her left leg and has a small left-sided Baker's cyst measuring 0.9 by 0.7 by 1.4 cm. The pain is exacerbated by movement and not relieved by elevation. Restless leg syndrome at night complicates her symptoms. In the past I had advised dose adjust otc ibuprofen  200 mg 1-4 tabs every 8 hours for pain in order to protect kidney/decrease effect on kidneys but she insists to use 600-800 mg dose tabs. In the past She uses ibuprofen  800 mg twice daily for pain management. She is cautious about her kidney function due to past dehydration.  She describes severe back pain localized to the left  sacroiliac area, exacerbated by movement. She uses a heating pad for relief but avoids lidocaine  patches due to sensitive skin. She has a history of pain in both knees as well.  For COPD, she uses Trelegy and albuterol . She experiences mucus production, sometimes thick and yellow-green, and has used azithromycin  in the past for similar symptoms when concerned for bacterial infection.. Gabapentin  is used for restless leg syndrome.  She notes a decreased appetite, attributing it to aging, and uses nutritional supplements like Boost.   She is not interested in using compression socks or braces due to discomfort and heat.   Review of Systems  Constitutional:  Negative for chills, fatigue and fever.  HENT:  Negative for congestion.   Respiratory:  Positive for cough. Negative for chest tightness, shortness of breath and wheezing.   Cardiovascular:  Negative for chest pain and palpitations.  Gastrointestinal:  Negative for abdominal pain, constipation and diarrhea.  Genitourinary:  Negative for dysuria, frequency, pelvic pain and urgency.  Musculoskeletal:   Positive for back pain.       Si pain left side.  Skin:  Negative for rash.  Neurological:  Negative for dizziness, speech difficulty, weakness and light-headedness.  Hematological:  Negative for adenopathy.  Psychiatric/Behavioral:  Negative for behavioral problems and dysphoric mood. The patient is not nervous/anxious.     Past Medical History:  Diagnosis Date   Anemia    low iron   Anxiety    Arthritis    Bipolar disorder (HCC)    Cancer (HCC) 1973   COPD (chronic obstructive pulmonary disease) (HCC)    Family history of adverse reaction to anesthesia    mom was put to sleep and she never woke up -    GERD (gastroesophageal reflux disease)    Guillain Barr syndrome (HCC)    numbness in toes, legs hurt   Headache    migraines as a teenager   IBS (irritable bowel syndrome)    Lump, breast    removed years ago per Pt.   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, has quiet' seizures   Thyroid  disease      Social History   Socioeconomic History   Marital status: Widowed    Spouse name: Not on file   Number of children: Not on file   Years of education: Not on file   Highest education level: Some college, no degree  Occupational History   Not on file  Tobacco Use   Smoking status: Former    Current packs/day: 0.00    Average packs/day: 0.3 packs/day for 6.0 years (1.5 ttl pk-yrs)    Types: Cigarettes    Start date: 10/13/2001    Quit date: 10/14/2007    Years since quitting: 16.7   Smokeless tobacco: Never  Vaping Use   Vaping status: Never Used  Substance and Sexual Activity   Alcohol use: No   Drug use: No   Sexual activity: Not on file  Other Topics Concern   Not on file  Social History Narrative   Not on file   Social Drivers of Health   Financial Resource Strain: Low Risk  (06/19/2024)   Overall Financial Resource Strain (CARDIA)    Difficulty of Paying Living Expenses: Not  hard at all  Food Insecurity: No Food Insecurity (06/19/2024)   Hunger Vital Sign    Worried About Running Out of Food in the Last Year: Never true    Ran Out of Food in the Last Year: Never true  Transportation Needs: No Transportation Needs (06/19/2024)   PRAPARE - Administrator, Civil Service (Medical): No    Lack of Transportation (Non-Medical): No  Physical Activity: Inactive (06/19/2024)   Exercise Vital Sign    Days of Exercise per Week: 0 days    Minutes of Exercise per Session: 0 min  Stress: Stress Concern Present (06/19/2024)   [employer] of Occupational Health - Occupational Stress Questionnaire    Feeling of Stress: Rather much  Social Connections: Socially Isolated (06/19/2024)   Social Connection and Isolation Panel    Frequency of Communication with Friends and Family: More than three times a week    Frequency of Social Gatherings with Friends and Family: Once a week    Attends Religious Services: Never    Database administrator or Organizations: No    Attends Banker Meetings: Never    Marital Status: Widowed  Intimate Partner Violence: Not At Risk (06/19/2024)   Humiliation, Afraid, Rape, and Kick questionnaire    Fear of Current or Ex-Partner: No    Emotionally Abused: No    Physically Abused: No    Sexually Abused: No    Past Surgical History:  Procedure Laterality Date   BREAST SURGERY     COLONOSCOPY     EYE SURGERY Bilateral    cataract surgery with lens implants   MANDIBLE RECONSTRUCTION     MINOR REMOVAL OF MANDIBULAR HARDWARE Right 10/15/2015   Procedure: MINOR REMOVAL OF Right MANDIBULAR HARDWARE;  Surgeon: Glendia Primrose, DDS;  Location: [facility] OR;  Service: Oral Surgery;  Laterality: Right;   TONSILLECTOMY      Family History  Problem Relation Age of Onset   Cancer Father    Emphysema Sister     Allergies  Allergen Reactions   Influenza Vaccines    Aspirin     Other Reaction(s): GI Intolerance    Current  Outpatient Medications on File Prior to Visit  Medication Sig Dispense Refill   albuterol  (PROVENTIL ) (2.5 MG/3ML) 0.083% nebulizer solution Take 3 mLs (2.5 mg total) by nebulization every 6 (six) hours as needed for wheezing. 180 mL 2   albuterol  (VENTOLIN  HFA) 108 (90 Base) MCG/ACT inhaler Inhale 2 puffs into the lungs every 6 (six) hours as needed for wheezing. 18 g 11   alclomethasone (ACLOVATE) 0.05 % cream Apply topically 2 (two) times daily as needed.     alendronate (FOSAMAX) 70 MG tablet Take  70 mg by mouth every Sunday. Take with a full glass of water on an empty stomach.     ARIPiprazole (ABILIFY) 15 MG tablet Take 15 mg by mouth daily.     azithromycin  (ZITHROMAX  Z-PAK) 250 MG tablet Per zpack instructions 6 tablet 0   B Complex Vitamins (VITAMIN B COMPLEX ) TABS Take 1 tablet by mouth daily. 30 tablet 5   benzonatate  (TESSALON ) 100 MG capsule TAKE ONE CAPSULE BY MOUTH THREE TIMES DAILY AS NEEDED FOR COUGH 30 capsule 2   benzonatate  (TESSALON ) 100 MG capsule Take 1 capsule (100 mg total) by mouth 3 (three) times daily as needed for cough. 90 capsule 2   benztropine (COGENTIN) 0.5 MG tablet Take 0.5 mg by mouth 2 (two) times daily.     Bepotastine  Besilate (BEPREVE) 1.5 % SOLN Place 1 drop into both eyes 2 (two) times daily as needed. 10 mL 5   Bepotastine  Besilate 1.5 % SOLN Apply to eye.     cromolyn (OPTICROM) 4 % ophthalmic solution 1 drop 4 (four) times daily.     diclofenac  Sodium (VOLTAREN  ARTHRITIS PAIN) 1 % GEL Apply 4 g topically 4 (four) times daily. 150 g 3   dicyclomine  (BENTYL ) 20 MG tablet Take 1 tablet (20 mg total) by mouth 3 (three) times daily as needed. 90 tablet 2   doxycycline  (ADOXA) 100 MG tablet Take 1 tablet by mouth daily.     Ensifentrine  (OHTUVAYRE ) 3 MG/2.5ML SUSP Inhale 1 ampule into the lungs in the morning and at bedtime.     erythromycin ophthalmic ointment Apply to eye.     famciclovir  (FAMVIR ) 250 MG tablet Take 1 tablet (250 mg total) by mouth 2  (two) times daily. 180 tablet 2   Fluticasone-Umeclidin-Vilant (TRELEGY ELLIPTA ) 200-62.5-25 MCG/ACT AEPB Inhale 1 puff into the lungs daily. 180 each 2   gabapentin  (NEURONTIN ) 400 MG capsule Take 3 capsules (1,200 mg total) by mouth 3 (three) times daily. 270 capsule 2   hydrOXYzine  (ATARAX ) 10 MG tablet Take 1-2 tablets (10-20 mg total) by mouth at bedtime as needed for anxiety (insomnia). 180 tablet 2   ibuprofen  (ADVIL ) 200 MG tablet 1-4 tab po every 8 hours prn moderate pain 60 tablet 2   ipratropium-albuterol  (DUONEB) 0.5-2.5 (3) MG/3ML SOLN Take 3 mLs by nebulization every 4 (four) hours as needed. 360 mL 2   levocetirizine (XYZAL ) 5 MG tablet Take 1 tablet (5 mg total) by mouth every evening. 30 tablet 3   levocetirizine (XYZAL ) 5 MG tablet Take 1 tablet (5 mg total) by mouth every evening. 90 tablet 2   loperamide  (IMODIUM ) 2 MG capsule TAKE ONE CAPSULE BY MOUTH EVERY 6 TO 8 HOURS AS NEEDED FOR DIARRHEA 30 capsule 5   losartan  (COZAAR ) 25 MG tablet TAKE 1 TABLET (25 MG TOTAL) BY MOUTH DAILY. 90 tablet 2   magnesium oxide (MAG-OX) 400 MG tablet Take 250 mg by mouth daily.     methimazole  (TAPAZOLE ) 5 MG tablet TAKE ONE TABLET BY MOUTH DAILY 90 tablet 1   montelukast  (SINGULAIR ) 10 MG tablet Take 1 tablet (10 mg total) by mouth at bedtime. 90 tablet 3   mupirocin  ointment (BACTROBAN ) 2 % Apply 1 Application topically 2 (two) times daily. 22 g 0   NONFORMULARY OR COMPOUNDED ITEM Elevated toilet seat 2 each 0   olopatadine  (PATANOL) 0.1 % ophthalmic solution Place 1 drop into both eyes 2 (two) times daily. 5 mL 3   olopatadine  (PATANOL) 0.1 % ophthalmic solution Place 1 drop  into both eyes 2 (two) times daily. 5 mL 12   oxcarbazepine (TRILEPTAL) 600 MG tablet Take 600 mg by mouth 2 (two) times daily.     oxyCODONE  (ROXICODONE ) 5 MG immediate release tablet Take 1 tablet (5 mg total) by mouth 3 (three) times daily as needed for moderate pain (pain score 4-6). 90 tablet 0   pantoprazole   (PROTONIX ) 40 MG tablet Take 1 tablet (40 mg total) by mouth daily. 90 tablet 3   potassium chloride  SA (KLOR-CON ) 20 MEQ tablet Take 1 tablet (20 mEq total) by mouth 2 (two) times daily for 7 days. 14 tablet 0   predniSONE  (DELTASONE ) 10 MG tablet Take 1 tablet (10 mg total) by mouth as needed. 20 tablet 0   predniSONE  (DELTASONE ) 10 MG tablet 4 tabs for 2 days, then 3 tabs for 2 days, 2 tabs for 2 days, then 1 tab for 2 days, then stop 20 tablet 0   sodium chloride  HYPERTONIC 3 % nebulizer solution 3 ml via neb twice daily 750 mL 12   traZODone (DESYREL) 50 MG tablet Take 50 mg by mouth at bedtime.     triamcinolone  cream (KENALOG ) 0.1 % Apply topically 2 (two) times daily as needed. 30 g 3   triamcinolone  cream (KENALOG ) 0.1 % Apply topically 2 (two) times daily. 30 g 2   Vitamin D , Ergocalciferol , (DRISDOL ) 1.25 MG (50000 UNIT) CAPS capsule TAKE ONE CAPSULE BY MOUTH ONCE WEEKLY FOR SIX WEEKS. 6 capsule 0   Vitamin D , Ergocalciferol , (DRISDOL ) 1.25 MG (50000 UNIT) CAPS capsule Take 1 capsule (50,000 Units total) by mouth every 7 (seven) days. 4 capsule 2   Vitamin D , Ergocalciferol , (DRISDOL ) 1.25 MG (50000 UNIT) CAPS capsule 1 capsule per month 3 capsule 0   No current facility-administered medications on file prior to visit.    BP 118/78   Pulse 74 Comment: pulse ox reads 34  Ht 5' 3.5 (1.613 m)   SpO2 97%   BMI 28.80 kg/m        Objective:   Physical Exam   General Mental Status- Alert. General Appearance- Not in acute distress.   Skin General: Color- Normal Color. Moisture- Normal Moisture.  Neck No JVD.  Chest and Lung Exam Auscultation: Breath Sounds:-Normal.  Cardiovascular Auscultation:Rythm- Regular. Murmurs & Other Heart Sounds:Auscultation of the heart reveals- No Murmurs.  Abdomen Inspection:-Inspeection Normal. Palpation/Percussion:Note:No mass. Palpation and Percussion of the abdomen reveal- Non Tender, Non Distended + BS, no rebound or  guarding.   Neurologic Cranial Nerve exam:- CN III-XII intact(No nystagmus), symmetric smile. Strength:- 5/5 equal and symmetric strength both upper and lower extremities.    Back- no mid lumbar pain. But pain in left si area. Left knee- no pain on rom. Reports dull pain constant that is worse when knee is flexed. Less when knee is extended.    Assessment & Plan:   Patient Instructions  Left knee Baker's cyst with pain Small Baker's cyst causing significant pain, no clot on ultrasound. She prefers to avoid procedures. - Refer to orthopedist if pain worsens. - Repeat ultrasound if pain increases to assess for cyst enlargement. - Prescribe ibuprofen  600 mg for pain management, pending kidney function review.  Chronic low back pain, sacroiliac region Chronic low back pain in sacroiliac region, severe and movement-exacerbated. Lidocaine  patches not preferred. - Use ibuprofen  600 mg for pain relief. - Apply heating pad to affected area as needed.  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 Severe emphysema. Azithromycin  effective in past. - Prescribe azithromycin  (Z-Pak) if chest congestion worsens and sputum remains colored. - Continue Trelegy and albuterol  for COPD management. - Attempt to find [employer]ucine[employer] for mucus thinning. - Consider chest x-ray if symptoms worsen.  Restless legs syndrome Continued management with gabapentin .  Decreased appetite in elderly Decreased appetite, common in elderly. - Continue Boost three times daily.  Follow up mid september or sooner if needed   Time spent with patient today was  42 minutes which consisted of chart revdiew, discussing diagnosis, work up treatment and documentation.

## 2024-07-01 NOTE — Patient Instructions (Signed)
 Left knee Baker's cyst with pain Small Baker's cyst causing significant pain, no clot on ultrasound. She prefers to avoid procedures. - Refer to orthopedist if pain worsens. - Repeat ultrasound if pain increases to assess for cyst enlargement. - Prescribe ibuprofen  600 mg for pain management, pending kidney function review.  Chronic low back pain, sacroiliac region Chronic low back pain in sacroiliac region, severe and movement-exacerbated. Lidocaine  patches not preferred. - Use ibuprofen  600 mg for pain relief. - Apply heating pad to affected area as needed.  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 Severe emphysema. Azithromycin  effective in past. - Prescribe azithromycin  (Z-Pak) if chest congestion worsens and sputum remains colored. - Continue Trelegy and albuterol  for COPD management. - Attempt to find Mucine[employer] for mucus thinning. - Consider chest x-ray if symptoms worsen.  Restless legs syndrome Continued management with gabapentin .  Decreased appetite in elderly Decreased appetite, common in elderly. - Continue Boost three times daily.  Follow up mid september or sooner if needed

## 2024-07-02 ENCOUNTER — Telehealth: Payer: Self-pay | Admitting: Medical

## 2024-07-02 DIAGNOSIS — J449 Chronic obstructive pulmonary disease, unspecified: Secondary | ICD-10-CM | POA: Diagnosis not present

## 2024-07-02 NOTE — Telephone Encounter (Signed)
 The pt will be calling back to schedule a lab appt for hemolyzed

## 2024-07-02 NOTE — Addendum Note (Signed)
 Addended by: TRUDY CURVIN RAMAN on: 07/02/2024 11:45 AM   Modules accepted: Orders

## 2024-07-08 DIAGNOSIS — J449 Chronic obstructive pulmonary disease, unspecified: Secondary | ICD-10-CM | POA: Diagnosis not present

## 2024-07-09 ENCOUNTER — Ambulatory Visit: Payer: Self-pay | Admitting: Medical

## 2024-07-09 ENCOUNTER — Other Ambulatory Visit (INDEPENDENT_AMBULATORY_CARE_PROVIDER_SITE_OTHER)

## 2024-07-09 ENCOUNTER — Telehealth: Payer: Self-pay | Admitting: Licensed Clinical Social Worker

## 2024-07-09 DIAGNOSIS — R944 Abnormal results of kidney function studies: Secondary | ICD-10-CM | POA: Diagnosis not present

## 2024-07-09 LAB — COMPREHENSIVE METABOLIC PANEL WITH GFR
ALT: 10 U/L (ref 0–35)
AST: 15 U/L (ref 0–37)
Albumin: 4.2 g/dL (ref 3.5–5.2)
Alkaline Phosphatase: 70 U/L (ref 39–117)
BUN: 13 mg/dL (ref 6–23)
CO2: 31 meq/L (ref 19–32)
Calcium: 9.4 mg/dL (ref 8.4–10.5)
Chloride: 102 meq/L (ref 96–112)
Creatinine, Ser: 0.62 mg/dL (ref 0.40–1.20)
GFR: 88.77 mL/min (ref >60.00)
Glucose, Bld: 91 mg/dL (ref 70–99)
Potassium: 4.9 meq/L (ref 3.5–5.1)
Sodium: 138 meq/L (ref 135–145)
Total Bilirubin: 0.5 mg/dL (ref 0.2–1.2)
Total Protein: 7.1 g/dL (ref 6.0–8.3)

## 2024-07-14 ENCOUNTER — Other Ambulatory Visit: Payer: Self-pay | Admitting: Medical

## 2024-07-14 DIAGNOSIS — J449 Chronic obstructive pulmonary disease, unspecified: Secondary | ICD-10-CM | POA: Diagnosis not present

## 2024-07-14 NOTE — Telephone Encounter (Signed)
 Copied from CRM [id number]. Topic: Clinical - Medication Refill >> Jul 14, 2024 12:49 PM Abigail D wrote: Medication:  gabapentin  (NEURONTIN ) 400 MG capsule dicyclomine  (BENTYL ) 20 MG tablet pantoprazole  (PROTONIX ) 40 MG tablet famciclovir  (FAMVIR ) 250 MG tablet losartan  (COZAAR ) 25 MG tablet potassium chloride  SA (KLOR-CON ) 20 MEQ tablet Vitamin D , Ergocalciferol , (DRISDOL ) 1.25 MG (50000 UNIT) CAPS capsule montelukast  (SINGULAIR ) 10 MG tablet levocetirizine (XYZAL ) 5 MG tablet loperamide  (IMODIUM ) 2 MG capsule ibuprofen  (ADVIL ) 600 MG tablet [[id number]]  Has the patient contacted their pharmacy? No (Agent: If no, request that the patient contact the pharmacy for the refill. If patient does not wish to contact the pharmacy document the reason why and proceed with request.) (Agent: If yes, when and what did the pharmacy advise?)  This is the patient's preferred pharmacy:   [facility] [address] [place] - [phone] [place] [address] Phone: [phone] Fax: [phone]  Is this the correct pharmacy for this prescription? Yes If no, delete pharmacy and type the correct one.   Has the prescription been filled recently? No  Is the patient out of the medication? Yes  Has the patient been seen for an appointment in the last year OR does the patient have an upcoming appointment? Yes  Can we respond through MyChart? No  Agent: Please be advised that Rx refills may take up to 3 business days. We ask that you follow-up with your pharmacy.

## 2024-07-15 DIAGNOSIS — J441 Chronic obstructive pulmonary disease with (acute) exacerbation: Secondary | ICD-10-CM | POA: Diagnosis not present

## 2024-07-16 MED ORDER — IBUPROFEN 600 MG PO TABS: 600.0000 mg | ORAL_TABLET | Freq: Two times a day (BID) | ORAL | 0 refills | Status: DC | PRN

## 2024-07-16 MED ORDER — LOSARTAN POTASSIUM 25 MG PO TABS: 25.0000 mg | ORAL_TABLET | Freq: Every day | ORAL | 2 refills | Status: DC

## 2024-07-16 MED ORDER — FAMCICLOVIR 250 MG PO TABS: 250.0000 mg | ORAL_TABLET | Freq: Two times a day (BID) | ORAL | 2 refills | Status: AC

## 2024-07-16 MED ORDER — PANTOPRAZOLE SODIUM 40 MG PO TBEC: 40.0000 mg | DELAYED_RELEASE_TABLET | Freq: Every day | ORAL | 3 refills | Status: AC

## 2024-07-16 MED ORDER — DICYCLOMINE HCL 20 MG PO TABS: 20.0000 mg | ORAL_TABLET | Freq: Three times a day (TID) | ORAL | 2 refills | Status: AC | PRN

## 2024-07-16 MED ORDER — LOPERAMIDE HCL 2 MG PO CAPS: ORAL_CAPSULE | ORAL | 5 refills | Status: DC

## 2024-07-16 MED ORDER — POTASSIUM CHLORIDE CRYS ER 20 MEQ PO TBCR: 20.0000 meq | EXTENDED_RELEASE_TABLET | Freq: Two times a day (BID) | ORAL | 0 refills | Status: AC

## 2024-07-16 MED ORDER — GABAPENTIN 400 MG PO CAPS: 1200.0000 mg | ORAL_CAPSULE | Freq: Three times a day (TID) | ORAL | 2 refills | Status: AC

## 2024-07-16 MED ORDER — VITAMIN D (ERGOCALCIFEROL) 1.25 MG (50000 UNIT) PO CAPS: ORAL_CAPSULE | ORAL | 0 refills | Status: DC

## 2024-07-16 MED ORDER — MONTELUKAST SODIUM 10 MG PO TABS: 10.0000 mg | ORAL_TABLET | Freq: Every day | ORAL | 3 refills | Status: AC

## 2024-07-16 MED ORDER — LEVOCETIRIZINE DIHYDROCHLORIDE 5 MG PO TABS: 5.0000 mg | ORAL_TABLET | Freq: Every evening | ORAL | 3 refills | Status: DC

## 2024-07-18 ENCOUNTER — Telehealth: Payer: Self-pay | Admitting: Registered Nurse

## 2024-07-18 DIAGNOSIS — M545 Low back pain, unspecified: Secondary | ICD-10-CM

## 2024-07-18 DIAGNOSIS — G894 Chronic pain syndrome: Secondary | ICD-10-CM

## 2024-07-18 MED ORDER — OXYCODONE HCL 5 MG PO TABS: 5.0000 mg | ORAL_TABLET | Freq: Three times a day (TID) | ORAL | 0 refills | Status: DC | PRN

## 2024-07-18 NOTE — Telephone Encounter (Signed)
 PMP was Reviewed.  Oxycodone  e-scribed to pharmacy.  Jill Spence has a scheduled appointment

## 2024-07-21 ENCOUNTER — Telehealth: Payer: Self-pay

## 2024-07-21 NOTE — Telephone Encounter (Signed)
 Request for Oxychodone (ROXICODONE ) 5 MG immediate release tablet. Rx is on hold at the pharmacy. Patient informed.

## 2024-07-22 ENCOUNTER — Ambulatory Visit: Payer: 59 | Admitting: Internal Medicine

## 2024-07-23 DIAGNOSIS — J449 Chronic obstructive pulmonary disease, unspecified: Secondary | ICD-10-CM | POA: Diagnosis not present

## 2024-08-01 DIAGNOSIS — Z1231 Encounter for screening mammogram for malignant neoplasm of breast: Secondary | ICD-10-CM | POA: Diagnosis not present

## 2024-08-01 LAB — HM MAMMOGRAPHY

## 2024-08-02 DIAGNOSIS — J449 Chronic obstructive pulmonary disease, unspecified: Secondary | ICD-10-CM | POA: Diagnosis not present

## 2024-08-05 ENCOUNTER — Encounter: Payer: Self-pay | Admitting: Internal Medicine

## 2024-08-05 ENCOUNTER — Ambulatory Visit (INDEPENDENT_AMBULATORY_CARE_PROVIDER_SITE_OTHER): Admitting: Internal Medicine

## 2024-08-05 VITALS — BP 124/80 | HR 57 | Ht 63.5 in | Wt 167.2 lb

## 2024-08-05 DIAGNOSIS — E042 Nontoxic multinodular goiter: Secondary | ICD-10-CM | POA: Diagnosis not present

## 2024-08-05 DIAGNOSIS — E0590 Thyrotoxicosis, unspecified without thyrotoxic crisis or storm: Secondary | ICD-10-CM | POA: Diagnosis not present

## 2024-08-05 NOTE — Patient Instructions (Addendum)
 Please stop at the lab.  Please continue Methimazole  5 mg daily.  Please return for a follow-up appointment in 1 year, but for labs in 6 months.

## 2024-08-05 NOTE — Progress Notes (Signed)
 Patient ID: Jill Spence, female   DOB: 08-Feb-1951, 73 y.o.   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  She previously saw Dr. Kassie, but last visit with me 6 months ago.   She is here with her mental health caregiver.  Interim history: She still has intermittent problems swallowing especially when she lays on her back watching TV, and continues to feel neck pressure but she attributes this to postnasal drip and mucus. She also has shortness of breath, and continues on 3 lpm of oxygen .  Today she has a cough.  She has prednisone  at home but she has not taken this in few days.   Thyrotoxicosis: Patient was found to have abnormal thyroid  tests at least in 2018 -per my review of the records.  She is currently on methimazole  5 mg daily.  I reviewed pt's thyroid  tests: Lab Results  Component Value Date   TSH 0.88 01/22/2024   TSH 0.39 07/19/2023   TSH 0.52 03/05/2023   TSH 0.26 (L) 01/11/2023   TSH 0.69 07/11/2022   TSH 0.59 03/23/2022   TSH 0.81 12/22/2021   TSH 0.32 (L) 09/21/2021   TSH 0.70 07/26/2021   TSH 1.91 04/26/2021   FREET4 0.9 01/22/2024   FREET4 0.70 07/19/2023   FREET4 0.66 03/05/2023   FREET4 0.65 01/11/2023   FREET4 0.62 07/11/2022   FREET4 0.65 03/23/2022   FREET4 0.56 (L) 12/22/2021   FREET4 0.51 (L) 09/21/2021   FREET4 0.57 (L) 07/26/2021   FREET4 0.37 (L) 04/26/2021   T3FREE 2.4 01/22/2024   T3FREE 2.9 07/19/2023   T3FREE 2.9 03/05/2023   T3FREE 2.6 01/11/2023   T3FREE 2.4 07/11/2022   Lab Results  Component Value Date   T3FREE 2.4 01/22/2024   T3FREE 2.9 07/19/2023   T3FREE 2.9 03/05/2023   T3FREE 2.6 01/11/2023   T3FREE 2.4 07/11/2022     Ref Range & Units   Thyroid  Stimulating Hormone (TSH) 0.34 - 5.66 IU/mL 0.04 Low    Thyroxine, Free (FT4) 0.52 - 1.21 ng/dL 9.33   Resulting Agency  [facility]    Specimen Collected: 09/24/17 11:35 Last Resulted: 09/24/17 13:59  Received From: [facility]  Result Received: 03/15/21 13:35   Antithyroid antibodies: No results found for: TSI  Multiple thyroid  nodules:  Thyroid  U/S (10/26/2020): Parenchymal Echotexture: Moderately heterogenous  Isthmus: 0.3 cm  Right lobe: 4.6 x 1.5 x 1.6 cm  Left lobe: 4.4 x 2.3 x 2.4 cm  _________________________________________________________   Estimated total number of nodules >/= 1 cm: 1 _________________________________________________________   Nodule # 1:  Location: Right; Inferior  Maximum size: 0.9 cm; Other 2 dimensions: 0.6 x 0.5 cm  Composition: solid/almost completely solid (2)  Echogenicity: hypoechoic (2) Given size (<0.9 cm) and appearance, this nodule does NOT meet TI-RADS criteria for biopsy or dedicated follow-up.  _________________________________________________________   Nodule # 2:  Location: Right; Mid  Maximum size: 0.8 cm; Other 2 dimensions: 0.5 x 0 5 cm  Composition: spongiform (0)  Echogenicity: isoechoic (1)  This nodule does NOT meet TI-RADS criteria for biopsy or dedicated follow-up.  _________________________________________________________   Nodule # 3:  Location: Right; Superior medial  Maximum size: 0.6 cm; Other 2 dimensions: 0.5 x 0.5 cm  Composition: solid/almost completely solid (2)  Echogenicity: hypoechoic (2) Given size (<0.9 cm) and appearance, this nodule does NOT meet TI-RADS criteria for biopsy or  dedicated follow-up.  _________________________________________________________   Nodule # 4:  Location: Right; Superior  Maximum size: 0.8 cm; Other 2 dimensions: 0.5 x 0.7 cm  Composition: mixed cystic and solid (1)  Echogenicity: hypoechoic (2)  Given size (<1.4 cm) and appearance, this nodule does NOT meet TI-RADS criteria for biopsy or dedicated follow-up.  _________________________________________________________   Nodule  # 5:  Location: Left; Inferior  Maximum size: 2.9 cm; Other 2 dimensions: 2.3 x 2.2 cm  Composition: mixed cystic and solid (1)  Echogenicity: hypoechoic (2) Margins: lobulated/irregular (2)  Echogenic foci: macrocalcifications (1)  ACR TI-RADS total points: 6. ACR TI-RADS risk category: TR4 (4-6 points)  **Given size (>/= 1.5 cm) and appearance, fine needle aspiration of this moderately suspicious nodule should be considered based on TI-RADS criteria.  _________________________________________________________   Nodule # 6:  Location: Left; Superior  Maximum size: 0.7 cm; Other 2 dimensions: 0.5 x 0.7 cm  Composition: solid/almost completely solid (2)  Echogenicity: hypoechoic (2) Given size (<0.9 cm) and appearance, this nodule does NOT meet TI-RADS criteria for biopsy or dedicated follow-up.  ________________________________________________________   IMPRESSION: 1. Multinodular goiter. 2. Dominant nodule in the left inferior thyroid  measuring up to 2.9 cm (labeled 5) which meets criteria (TI-RADS category 4) for tissue sampling. Recommend ultrasound-guided fine-needle aspiration. 3. Additional scattered thyroid  nodules appear benign. These nodules require additional dedicated follow-up.    Thyroid  uptake and scan (01/13/2021): Dominant hot nodule in the left thyroid  lobe with suppression of the  rest of the gland. Findings suggest a hyperfunctioning thyroid   nodule.  4 hour I-131 uptake = 10.1% (normal 5-20%)   24 hour I-131 uptake = 22.4% (normal 10-30%)   IMPRESSION:  Findings suggest a hyperfunctioning nodule in the left thyroid  lobe  suppressing the rest of the gland.   PET scan (05/01/2021): The thyroid  nodules were not FDG avid  Thyroid  U/S (07/18/2022): Parenchymal Echotexture: Moderately heterogenous  Isthmus: 0.3 cm  Right lobe: 4.1 cm x 1.4 cm x 1.9 cm  Left lobe: 4.6 cm x 2.5 cm x 2.4 cm  ____________________________________________________   Nodule  labeled 1, superior right thyroid , 6 mm. Nodule has cystic/spongiform characteristics and does not meet criteria for surveillance.   Nodule labeled 2, superior right thyroid , 8 mm. Nodule has cystic/spongiform characteristics and does not meet criteria for surveillance.   Nodule labeled 3, mid right thyroid , 8 mm. Nodule has spongiform/cystic characteristics and does not meet criteria for surveillance.   Nodule labeled 4, inferior right thyroid , 7 mm, decreased in size, TR 4 characteristics. Nodule does not meet criteria for surveillance.   Nodule labeled 5, mid left thyroid , unchanged 7 mm. Nodule has TR 4 characteristics and does not meet criteria for surveillance.   Nodule labeled 6, inferior left thyroid , 3.1 cm. Nodule has TR 4 characteristics and meets criteria for biopsy.   No adenopathy   IMPRESSION: Multinodular thyroid  again demonstrated.   Left inferior thyroid  nodule again meets criteria for biopsy, as designated by the newly established ACR TI-RADS criteria, and referral for biopsy is recommended.    FNA (09/13/2022): Atypia of undetermined significance (Bethesda category III)  Afirma:  benign  Mentions: - + hoarseness - + dysphagia - + choking - + SOB with lying down These improved in the last year, but still having them intermittently.  No: - tremors - anxiety - palpitations - hyperdefecation - weight loss  Pt does have a FH of thyroid  ds.: nieces. No FH of thyroid  cancer. No h/o radiation tx to head or  neck. + steroid use frequently - not recently. No herbal supplements.  Not on a B complex.  She also has a history of vitamin D  deficiency-on a vitamin D  supplement, IBS -diarrhea. She has Guillain-Barr syndrome - with exacerbations.  ROS: + see HPI  Past Medical History:  Diagnosis Date   Anemia    low iron   Anxiety    Arthritis    Bipolar disorder (HCC)    Cancer (HCC) 1973   COPD (chronic obstructive pulmonary disease) (HCC)     Family history of adverse reaction to anesthesia    mom was put to sleep and she never woke up -    GERD (gastroesophageal reflux disease)    Guillain Barr syndrome (HCC)    numbness in toes, legs hurt   Headache    migraines as a teenager   IBS (irritable bowel syndrome)    Lump, breast    removed years ago per Pt.   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, has quiet' seizures   Thyroid  disease    Past Surgical History:  Procedure Laterality Date   BREAST SURGERY     COLONOSCOPY     EYE SURGERY Bilateral    cataract surgery with lens implants   MANDIBLE RECONSTRUCTION     MINOR REMOVAL OF MANDIBULAR HARDWARE Right 10/15/2015   Procedure: MINOR REMOVAL OF Right MANDIBULAR HARDWARE;  Surgeon: Glendia Primrose, DDS;  Location: [facility] OR;  Service: Oral Surgery;  Laterality: Right;   TONSILLECTOMY     Social History   Socioeconomic History   Marital status: Widowed    Spouse name: Not on file   Number of children: Not on file   Years of education: Not on file   Highest education level: Some college, no degree  Occupational History   Not on file  Tobacco Use   Smoking status: Former    Current packs/day: 0.00    Average packs/day: 0.3 packs/day for 6.0 years (1.5 ttl pk-yrs)    Types: Cigarettes    Start date: 10/13/2001    Quit date: 10/14/2007    Years since quitting: 16.8   Smokeless tobacco: Never  Vaping Use   Vaping status: Never Used  Substance and Sexual Activity   Alcohol use: No   Drug use: No   Sexual activity: Not on file  Other Topics Concern   Not on file  Social History Narrative   Not on file   Social Drivers of Health   Financial Resource Strain: Low Risk  (06/19/2024)   Overall Financial Resource Strain (CARDIA)    Difficulty of Paying Living Expenses: Not hard at all  Food Insecurity: No Food Insecurity (06/19/2024)   Hunger Vital Sign    Worried About Running Out of Food in  the Last Year: Never true    Ran Out of Food in the Last Year: Never true  Transportation Needs: No Transportation Needs (06/19/2024)   PRAPARE - Administrator, Civil Service (Medical): No    Lack of Transportation (Non-Medical): No  Physical Activity: Inactive (06/19/2024)   Exercise Vital Sign    Days of Exercise per Week: 0 days    Minutes of Exercise per Session: 0 min  Stress: Stress Concern Present (06/19/2024)   [employer] of Occupational Health - Occupational Stress Questionnaire    Feeling of Stress: Rather much  Social Connections: Socially Isolated (  06/19/2024)   Social Connection and Isolation Panel    Frequency of Communication with Friends and Family: More than three times a week    Frequency of Social Gatherings with Friends and Family: Once a week    Attends Religious Services: Never    Database administrator or Organizations: No    Attends Banker Meetings: Never    Marital Status: Widowed  Intimate Partner Violence: Not At Risk (06/19/2024)   Humiliation, Afraid, Rape, and Kick questionnaire    Fear of Current or Ex-Partner: No    Emotionally Abused: No    Physically Abused: No    Sexually Abused: No   Current Outpatient Medications on File Prior to Visit  Medication Sig Dispense Refill   albuterol  (PROVENTIL ) (2.5 MG/3ML) 0.083% nebulizer solution Take 3 mLs (2.5 mg total) by nebulization every 6 (six) hours as needed for wheezing. 180 mL 2   albuterol  (VENTOLIN  HFA) 108 (90 Base) MCG/ACT inhaler Inhale 2 puffs into the lungs every 6 (six) hours as needed for wheezing. 18 g 11   alclomethasone (ACLOVATE) 0.05 % cream Apply topically 2 (two) times daily as needed.     alendronate (FOSAMAX) 70 MG tablet Take 70 mg by mouth every Sunday. Take with a full glass of water on an empty stomach.     ARIPiprazole (ABILIFY) 15 MG tablet Take 15 mg by mouth daily.     azithromycin  (ZITHROMAX  Z-PAK) 250 MG tablet Per zpack instructions 6 tablet 0    B Complex Vitamins (VITAMIN B COMPLEX ) TABS Take 1 tablet by mouth daily. 30 tablet 5   benzonatate  (TESSALON ) 100 MG capsule TAKE ONE CAPSULE BY MOUTH THREE TIMES DAILY AS NEEDED FOR COUGH 30 capsule 2   benzonatate  (TESSALON ) 100 MG capsule Take 1 capsule (100 mg total) by mouth 3 (three) times daily as needed for cough. 90 capsule 2   benztropine (COGENTIN) 0.5 MG tablet Take 0.5 mg by mouth 2 (two) times daily.     Bepotastine  Besilate (BEPREVE) 1.5 % SOLN Place 1 drop into both eyes 2 (two) times daily as needed. 10 mL 5   Bepotastine  Besilate 1.5 % SOLN Apply to eye.     cromolyn (OPTICROM) 4 % ophthalmic solution 1 drop 4 (four) times daily.     diclofenac  Sodium (VOLTAREN  ARTHRITIS PAIN) 1 % GEL Apply 4 g topically 4 (four) times daily. 150 g 3   dicyclomine  (BENTYL ) 20 MG tablet Take 1 tablet (20 mg total) by mouth 3 (three) times daily as needed. 90 tablet 2   doxycycline  (ADOXA) 100 MG tablet Take 1 tablet by mouth daily.     Ensifentrine  (OHTUVAYRE ) 3 MG/2.5ML SUSP Inhale 1 ampule into the lungs in the morning and at bedtime.     erythromycin ophthalmic ointment Apply to eye.     famciclovir  (FAMVIR ) 250 MG tablet Take 1 tablet (250 mg total) by mouth 2 (two) times daily. 180 tablet 2   Fluticasone-Umeclidin-Vilant (TRELEGY ELLIPTA ) 200-62.5-25 MCG/ACT AEPB Inhale 1 puff into the lungs daily. 180 each 2   gabapentin  (NEURONTIN ) 400 MG capsule Take 3 capsules (1,200 mg total) by mouth 3 (three) times daily. 270 capsule 2   hydrOXYzine  (ATARAX ) 10 MG tablet Take 1-2 tablets (10-20 mg total) by mouth at bedtime as needed for anxiety (insomnia). 180 tablet 2   ibuprofen  (ADVIL ) 200 MG tablet 1-4 tab po every 8 hours prn moderate pain 60 tablet 2   ibuprofen  (ADVIL ) 600 MG tablet Take 1 tablet (600  mg total) by mouth 2 (two) times daily as needed for knee pain. 30 tablet 0   ipratropium-albuterol  (DUONEB) 0.5-2.5 (3) MG/3ML SOLN Take 3 mLs by nebulization every 4 (four) hours as needed. 360  mL 2   levocetirizine (XYZAL ) 5 MG tablet Take 1 tablet (5 mg total) by mouth every evening. 90 tablet 2   levocetirizine (XYZAL ) 5 MG tablet Take 1 tablet (5 mg total) by mouth every evening. 30 tablet 3   loperamide  (IMODIUM ) 2 MG capsule TAKE ONE CAPSULE BY MOUTH EVERY 6 TO 8 HOURS AS NEEDED FOR DIARRHEA 30 capsule 5   losartan  (COZAAR ) 25 MG tablet Take 1 tablet (25 mg total) by mouth daily. 90 tablet 2   magnesium oxide (MAG-OX) 400 MG tablet Take 250 mg by mouth daily.     methimazole  (TAPAZOLE ) 5 MG tablet TAKE ONE TABLET BY MOUTH DAILY 90 tablet 1   montelukast  (SINGULAIR ) 10 MG tablet Take 1 tablet (10 mg total) by mouth at bedtime. 90 tablet 3   mupirocin  ointment (BACTROBAN ) 2 % Apply 1 Application topically 2 (two) times daily. 22 g 0   NONFORMULARY OR COMPOUNDED ITEM Elevated toilet seat 2 each 0   olopatadine  (PATANOL) 0.1 % ophthalmic solution Place 1 drop into both eyes 2 (two) times daily. 5 mL 3   olopatadine  (PATANOL) 0.1 % ophthalmic solution Place 1 drop into both eyes 2 (two) times daily. 5 mL 12   oxcarbazepine (TRILEPTAL) 600 MG tablet Take 600 mg by mouth 2 (two) times daily.     oxyCODONE  (ROXICODONE ) 5 MG immediate release tablet Take 1 tablet (5 mg total) by mouth 3 (three) times daily as needed for moderate pain (pain score 4-6). 90 tablet 0   pantoprazole  (PROTONIX ) 40 MG tablet Take 1 tablet (40 mg total) by mouth daily. 90 tablet 3   potassium chloride  SA (KLOR-CON  M) 20 MEQ tablet Take 1 tablet (20 mEq total) by mouth 2 (two) times daily for 7 days. 14 tablet 0   predniSONE  (DELTASONE ) 10 MG tablet Take 1 tablet (10 mg total) by mouth as needed. 20 tablet 0   predniSONE  (DELTASONE ) 10 MG tablet 4 tabs for 2 days, then 3 tabs for 2 days, 2 tabs for 2 days, then 1 tab for 2 days, then stop 20 tablet 0   sodium chloride  HYPERTONIC 3 % nebulizer solution 3 ml via neb twice daily 750 mL 12   traZODone (DESYREL) 50 MG tablet Take 50 mg by mouth at bedtime.      triamcinolone  cream (KENALOG ) 0.1 % Apply topically 2 (two) times daily as needed. 30 g 3   triamcinolone  cream (KENALOG ) 0.1 % Apply topically 2 (two) times daily. 30 g 2   Vitamin D , Ergocalciferol , (DRISDOL ) 1.25 MG (50000 UNIT) CAPS capsule Take 1 capsule (50,000 Units total) by mouth every 7 (seven) days. 4 capsule 2   Vitamin D , Ergocalciferol , (DRISDOL ) 1.25 MG (50000 UNIT) CAPS capsule 1 capsule per month 3 capsule 0   Vitamin D , Ergocalciferol , (DRISDOL ) 1.25 MG (50000 UNIT) CAPS capsule TAKE ONE CAPSULE BY MOUTH ONCE WEEKLY FOR SIX WEEKS 6 capsule 0   No current facility-administered medications on file prior to visit.   Allergies  Allergen Reactions   Influenza Vaccines    Aspirin     Other Reaction(s): GI Intolerance   Family History  Problem Relation Age of Onset   Cancer Father    Emphysema Sister    PE: BP 124/80   Pulse (!) 57  Ht 5' 3.5 (1.613 m)   Wt 167 lb 3.2 oz (75.8 kg)   SpO2 95%   BMI 29.15 kg/m  Wt Readings from Last 3 Encounters:  08/05/24 167 lb 3.2 oz (75.8 kg)  06/17/24 165 lb 3.2 oz (74.9 kg)  06/10/24 165 lb (74.8 kg)   Constitutional: overweight, in NAD, in wheelchair, on oxygen  (3-4 lpm) Eyes: no exophthalmos, no lid lag, no stare ENT: no thyromegaly and no thyroid  nodules palpated but left thyroid  fullness, no cervical lymphadenopathy Cardiovascular: RRR, No MRG Respiratory: CTA B Musculoskeletal: no deformities Skin: no rashes Neurological: no tremor with outstretched hands  ASSESSMENT: 1.  Toxic adenoma with thyrotoxicosis  2.  Thyroid  nodules  3.  Vitamin D  deficiency - Per PCP  PLAN:  1. Patient with history of thyrotoxicosis, previously with thyrotoxic symptoms including weight loss, heat intolerance, hyperdefecation, palpitations, anxiety, all resolved on methimazole . - She had a thyroid  uptake and scan in 2022 and it showed a left hyperfunctioning thyroid  nodule as a possible cause for her thyrotoxicosis - She continues on  5 mg of methimazole  daily with good tolerance - She had a slightly low TSH, 0.26 in 01/2023 possibly due to taking prednisone  the night before.  We did not change the regimen and subsequent TFTs were normal.  Latest levels are from 01/2024. -We discussed again about options for treatment for her thyrotoxicosis and she agreed for RAI treatment if needed, but we did not have to use it -At today's visit, she has no thyrotoxic signs or symptoms. -Will recheck her TFTs and change the methimazole  dose accordingly -I will see her back in 1 year but, per her preference, we will recheck her TFTs in 6 months  2.  Thyroid  nodules -Patient with a history of subcentimeter right thyroid  nodules, not worrisome, but also a large left thyroid  nodule that showed lobulation and macrocalcifications.  This was most likely the site of the thyroid  overproduction, detected on the thyroid  uptake and scan.  We discussed that overactive thyroid  nodules are usually not malignant, however, due to the distorted architecture, we ended up biopsying this nodule in 09/2022.  The results were inconclusive but the Afirma molecular marker returned benign. We discussed that in this case, the risk of cancer is very low.   -She had some neck compression symptoms including dysphagia, shortness of breath and choking in 2023 but they improved afterwards.  They are stable now, not very bothersome. -In the past, she was worried about getting thyroid  surgery due to poor lung status and was also worried about keloid.  We also discussed about RFA.  At last visit, I wrote it down for her so she could get up and let me know if she wants to proceed with this. - at today's visit, we discussed again about RFA but she would not want to proceed with this due to the fact that it involves a needle -For now, we discussed about continuing to just monitor her and will check a thyroid  ultrasound at next OV  Orders Placed This Encounter  Procedures   TSH    T4, free   T3, free   Needs refills.  Lela Fendt, MD PhD [facility]

## 2024-08-06 ENCOUNTER — Other Ambulatory Visit: Payer: Self-pay | Admitting: Medical

## 2024-08-06 ENCOUNTER — Ambulatory Visit: Payer: Self-pay | Admitting: Internal Medicine

## 2024-08-06 LAB — T3, FREE: T3, Free: 2.6 pg/mL (ref 2.3–4.2)

## 2024-08-06 LAB — T4, FREE: Free T4: 0.8 ng/dL (ref 0.8–1.8)

## 2024-08-06 LAB — TSH: TSH: 0.63 m[IU]/L (ref 0.40–4.50)

## 2024-08-08 ENCOUNTER — Other Ambulatory Visit: Payer: Self-pay | Admitting: Medical

## 2024-08-08 ENCOUNTER — Encounter: Payer: Self-pay | Admitting: Licensed Clinical Social Worker

## 2024-08-08 ENCOUNTER — Telehealth: Payer: Self-pay | Admitting: Licensed Clinical Social Worker

## 2024-08-08 DIAGNOSIS — J449 Chronic obstructive pulmonary disease, unspecified: Secondary | ICD-10-CM | POA: Diagnosis not present

## 2024-08-08 NOTE — Patient Instructions (Signed)
 Darice CINDERELLA Shuck - I am sorry I was unable to reach you today for our scheduled appointment. I work with [place], [place], PA-C and am calling to support your healthcare needs. Please contact me at [phone] at your earliest convenience. I look forward to speaking with you soon.   Thank you,  Rolin Kerns, LCSW [place]  [facility], [facility] Clinical Social Worker Direct Dial: [phone]  Fax: [phone] Website: [email or web address] 4:56 PM

## 2024-08-11 DIAGNOSIS — J449 Chronic obstructive pulmonary disease, unspecified: Secondary | ICD-10-CM | POA: Diagnosis not present

## 2024-08-13 ENCOUNTER — Other Ambulatory Visit: Payer: Self-pay | Admitting: Medical

## 2024-08-14 ENCOUNTER — Encounter: Payer: Self-pay | Admitting: Registered Nurse

## 2024-08-14 ENCOUNTER — Encounter: Attending: Physical Medicine and Rehabilitation | Admitting: Registered Nurse

## 2024-08-14 VITALS — BP 113/70 | HR 74 | Ht 63.5 in | Wt 167.2 lb

## 2024-08-14 DIAGNOSIS — G8929 Other chronic pain: Secondary | ICD-10-CM | POA: Diagnosis not present

## 2024-08-14 DIAGNOSIS — M545 Low back pain, unspecified: Secondary | ICD-10-CM | POA: Diagnosis not present

## 2024-08-14 DIAGNOSIS — Z5181 Encounter for therapeutic drug level monitoring: Secondary | ICD-10-CM | POA: Diagnosis not present

## 2024-08-14 DIAGNOSIS — Z79891 Long term (current) use of opiate analgesic: Secondary | ICD-10-CM | POA: Diagnosis not present

## 2024-08-14 DIAGNOSIS — G610 Guillain-Barre syndrome: Secondary | ICD-10-CM | POA: Insufficient documentation

## 2024-08-14 DIAGNOSIS — G894 Chronic pain syndrome: Secondary | ICD-10-CM | POA: Insufficient documentation

## 2024-08-14 MED ORDER — OXYCODONE HCL 5 MG PO TABS: 5.0000 mg | ORAL_TABLET | Freq: Three times a day (TID) | ORAL | 0 refills | Status: DC | PRN

## 2024-08-14 NOTE — Progress Notes (Signed)
 Subjective:    Patient ID: Jill Spence, female    DOB: 08-11-1951, 73 y.o.   MRN: [id number]  HPI: Jill Spence is a 73 y.o. female who returns for follow up appointment for chronic pain and medication refill. She states her pain is located in her lower back and right knee pain. She rates  her pain 0. Her current exercise regime is walking and performing stretching exercises.  [name] Morphine equivalent is 22.50 MME.  Oral Swab was Performed Today.      Pain Inventory Average Pain 8 Pain Right Now 0 My pain is throbbing  In the last 24 hours, has pain interfered with the following? General activity 7 Relation with others 0 Enjoyment of life 5 What TIME of day is your pain at its worst? morning  and night Sleep (in general) Poor  Pain is worse with: bending, sitting, standing, and   Pain improves with: therapy/exercise and medication Relief from Meds: 2  Family History  Problem Relation Age of Onset   Cancer Father    Emphysema Sister    Social History   Socioeconomic History   Marital status: Widowed    Spouse name: Not on file   Number of children: Not on file   Years of education: Not on file   Highest education level: Some college, no degree  Occupational History   Not on file  Tobacco Use   Smoking status: Former    Current packs/day: 0.00    Average packs/day: 0.3 packs/day for 6.0 years (1.5 ttl pk-yrs)    Types: Cigarettes    Start date: 10/13/2001    Quit date: 10/14/2007    Years since quitting: 16.8   Smokeless tobacco: Never  Vaping Use   Vaping status: Never Used  Substance and Sexual Activity   Alcohol use: No   Drug use: No   Sexual activity: Not on file  Other Topics Concern   Not on file  Social History Narrative   Not on file   Social Drivers of Health   Financial Resource Strain: Low Risk  (06/19/2024)   Overall Financial Resource Strain (CARDIA)    Difficulty of Paying Living Expenses: Not hard at all  Food Insecurity: No  Food Insecurity (06/19/2024)   Hunger Vital Sign    Worried About Running Out of Food in the Last Year: Never true    Ran Out of Food in the Last Year: Never true  Transportation Needs: No Transportation Needs (06/19/2024)   PRAPARE - Administrator, Civil Service (Medical): No    Lack of Transportation (Non-Medical): No  Physical Activity: Inactive (06/19/2024)   Exercise Vital Sign    Days of Exercise per Week: 0 days    Minutes of Exercise per Session: 0 min  Stress: Stress Concern Present (06/19/2024)   [employer] of Occupational Health - Occupational Stress Questionnaire    Feeling of Stress: Rather much  Social Connections: Socially Isolated (06/19/2024)   Social Connection and Isolation Panel    Frequency of Communication with Friends and Family: More than three times a week    Frequency of Social Gatherings with Friends and Family: Once a week    Attends Religious Services: Never    Database administrator or Organizations: No    Attends Banker Meetings: Never    Marital Status: Widowed   Past Surgical History:  Procedure Laterality Date   BREAST SURGERY     COLONOSCOPY  EYE SURGERY Bilateral    cataract surgery with lens implants   MANDIBLE RECONSTRUCTION     MINOR REMOVAL OF MANDIBULAR HARDWARE Right 10/15/2015   Procedure: MINOR REMOVAL OF Right MANDIBULAR HARDWARE;  Surgeon: Glendia Primrose, DDS;  Location: [facility] OR;  Service: Oral Surgery;  Laterality: Right;   TONSILLECTOMY     Past Surgical History:  Procedure Laterality Date   BREAST SURGERY     COLONOSCOPY     EYE SURGERY Bilateral    cataract surgery with lens implants   MANDIBLE RECONSTRUCTION     MINOR REMOVAL OF MANDIBULAR HARDWARE Right 10/15/2015   Procedure: MINOR REMOVAL OF Right MANDIBULAR HARDWARE;  Surgeon: Glendia Primrose, DDS;  Location: [facility] OR;  Service: Oral Surgery;  Laterality: Right;   TONSILLECTOMY     Past Medical History:  Diagnosis Date   Anemia    low iron    Anxiety    Arthritis    Bipolar disorder (HCC)    Cancer (HCC) 1973   COPD (chronic obstructive pulmonary disease) (HCC)    Family history of adverse reaction to anesthesia    mom was put to sleep and she never woke up -    GERD (gastroesophageal reflux disease)    Guillain Barr syndrome (HCC)    numbness in toes, legs hurt   Headache    migraines as a teenager   IBS (irritable bowel syndrome)    Lump, breast    removed years ago per Pt.   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, has quiet' seizures   Thyroid  disease    BP 113/70 (Patient Position: Sitting, Cuff Size: Normal)   Pulse (!) 54   Ht 5' 3.5 (1.613 m)   Wt 167 lb 3.2 oz (75.8 kg)   SpO2 (!) 78%   BMI 29.15 kg/m   Opioid Risk Score:   Fall Risk Score:  `1  Depression screen PHQ 2/9     06/19/2024    4:12 PM 06/10/2024    1:38 PM 04/14/2024    1:32 PM 02/11/2024    1:33 PM 05/02/2023    3:50 PM 04/19/2023    1:43 PM 02/20/2023    1:54 PM  Depression screen PHQ 2/9  Decreased Interest 1 1 0 0 0 0 0  Down, Depressed, Hopeless 1 1 0 0 0 0 0  PHQ - 2 Score 2 2 0 0 0 0 0  Altered sleeping 0        Tired, decreased energy 3        Change in appetite 3        Feeling bad or failure about yourself  0        Trouble concentrating 0        Moving slowly or fidgety/restless 0        Suicidal thoughts 0        PHQ-9 Score 8        Difficult doing work/chores Somewhat difficult           Review of Systems  Musculoskeletal:  Positive for arthralgias, back pain, joint swelling and myalgias.       Right hip pain, bilateral knee and lower leg pain  All other systems reviewed and are negative.      Objective:   Physical Exam Vitals and nursing note reviewed.  Constitutional:      Appearance: Normal appearance.  Cardiovascular:     Rate and Rhythm: Normal rate and regular rhythm.     Pulses: Normal pulses.     Heart sounds: Normal heart  sounds.  Pulmonary:     Effort: Pulmonary effort is normal.     Breath sounds: Normal breath sounds.  Musculoskeletal:     Comments: Normal Muscle Bulk and Muscle Testing Reveals:  Upper Extremities: Full ROM and Muscle Strength 5/5  Lumbar Paraspinal Tenderness: L-4-L-5 Lower Extremities: Right: Decreased ROM and Muscle Strength 5/5 Left Lower Extremity: Full ROM and Muscle Strength 5/5 Arrived in wheelchair    Skin:    General: Skin is warm and dry.  Neurological:     Mental Status: She is alert and oriented to person, place, and time.  Psychiatric:        Mood and Affect: Mood normal.        Behavior: Behavior normal.          Assessment & Plan:  Guillain Barre Syndrome: Continue current medication regimen. Continue Outpatient Therapy. Continue current medication regimen. Continue to Monitor. 08/14/2024 Cervicalgia: Continue HEP as Tolerated. Continue to monitor.08/14/2024 Polyarthralgia: Continue current medication regimen . Continue to Monitor. 08/14/2024 Chronic Bilateral Low Back Pain: Continue HEP as Tolerated. Continue to Monitor. 08/14/2024 Chronic Pain of Bilateral Knee Pain R>L: Continue Outpatient Therapy. Continue current medication regimen. Continue to Monitor. 08/14/2024 Chronic Pain Syndrome:  Continue Oxycodone  5 mg three times a day as needed for pain. #90.  We will continue the opioid monitoring program, this consists of regular clinic visits, examinations, urine drug screen, pill counts as well as use of [place]  Controlled Substance Reporting system. A 12 month History has been reviewed on the [place]  Controlled Substance Reporting System on 08/14/2024   F/U in 2 months

## 2024-08-14 NOTE — Addendum Note (Signed)
 Addended by: GEORGINA BARI CROME on: 08/14/2024 02:26 PM   Modules accepted: Orders

## 2024-08-18 LAB — DRUG TOX MONITOR 1 W/CONF, ORAL FLD
Amphetamines: NEGATIVE ng/mL (ref <10)
Barbiturates: NEGATIVE ng/mL (ref <10)
Benzodiazepines: NEGATIVE ng/mL (ref <0.50)
Buprenorphine: NEGATIVE ng/mL (ref <0.10)
Cocaine: NEGATIVE ng/mL (ref <5.0)
Codeine: NEGATIVE ng/mL (ref <2.5)
Dihydrocodeine: NEGATIVE ng/mL (ref <2.5)
Fentanyl: NEGATIVE ng/mL (ref <0.10)
Heroin Metabolite: NEGATIVE ng/mL (ref <1.0)
Hydrocodone: NEGATIVE ng/mL (ref <2.5)
Hydromorphone: NEGATIVE ng/mL (ref <2.5)
MARIJUANA: NEGATIVE ng/mL (ref <2.5)
MDMA: NEGATIVE ng/mL (ref <10)
Meprobamate: NEGATIVE ng/mL (ref <2.5)
Methadone: NEGATIVE ng/mL (ref <5.0)
Morphine: NEGATIVE ng/mL (ref <2.5)
Nicotine Metabolite: NEGATIVE ng/mL (ref <5.0)
Norhydrocodone: NEGATIVE ng/mL (ref <2.5)
Noroxycodone: 10.7 ng/mL — ABNORMAL HIGH (ref <2.5)
Opiates: POSITIVE ng/mL — AB (ref <2.5)
Oxycodone: 55.3 ng/mL — ABNORMAL HIGH (ref <2.5)
Oxymorphone: NEGATIVE ng/mL (ref <2.5)
Phencyclidine: NEGATIVE ng/mL (ref <10)
Tapentadol: NEGATIVE ng/mL (ref <5.0)
Tramadol: NEGATIVE ng/mL (ref <5.0)
Zolpidem: NEGATIVE ng/mL (ref <5.0)

## 2024-08-18 LAB — DRUG TOX ALC METAB W/CON, ORAL FLD: Alcohol Metabolite: NEGATIVE ng/mL (ref <25)

## 2024-08-19 ENCOUNTER — Ambulatory Visit: Admitting: Primary Care

## 2024-08-19 ENCOUNTER — Encounter: Payer: Self-pay | Admitting: Primary Care

## 2024-08-19 VITALS — BP 124/64 | HR 73 | Temp 97.6°F | Ht 63.5 in | Wt 167.2 lb

## 2024-08-19 DIAGNOSIS — R911 Solitary pulmonary nodule: Secondary | ICD-10-CM | POA: Diagnosis not present

## 2024-08-19 DIAGNOSIS — J9611 Chronic respiratory failure with hypoxia: Secondary | ICD-10-CM

## 2024-08-19 DIAGNOSIS — G8929 Other chronic pain: Secondary | ICD-10-CM | POA: Diagnosis not present

## 2024-08-19 DIAGNOSIS — J449 Chronic obstructive pulmonary disease, unspecified: Secondary | ICD-10-CM

## 2024-08-19 DIAGNOSIS — M25561 Pain in right knee: Secondary | ICD-10-CM

## 2024-08-19 NOTE — Patient Instructions (Addendum)
  VISIT SUMMARY: During your visit, we discussed your ongoing issues with severe emphysema, COPD, chronic cough, fatigue, and restless leg syndrome. We reviewed your current treatments and made some adjustments to help manage your symptoms better. We also talked about your difficulties with pulmonary rehabilitation and sleep disturbances.  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 We recommend you use the Otuvair nebulizer twice daily, in the morning and evening. We provided you with two new nebulizer tubings today. We will order an oxygen  mask from the medical supply store to help with the discomfort from the cold air. Please use the nebulizer along with your Trelegy inhaler. We also discussed the potential use of an oxygen  mask to alleviate discomfort from the cold air. Additionally, we will refer you to an orthopedic doctor for a knee brace.  -CHRONIC COUGH WITH SPUTUM PRODUCTION AND FATIGUE: Your chronic cough and fatigue may be related to your COPD and sleep disturbances. We recommend using liquid Robitussin to help loosen congestion and advise against using menthol products as they may worsen your cough. If you cannot find your preferred Mucinex with Arctic Burst, consider using Mucinex DM.  -PULMONARY NODULES UNDER SURVEILLANCE: Pulmonary nodules are small growths in the lungs that need to be monitored. We will order a follow-up chest CT scan to check for any changes and rule out any serious conditions. We will coordinate the scheduling of this scan with you and your caregiver.  -RESTLESS LEGS SYNDROME: Restless legs syndrome causes an uncontrollable urge to move your legs, often interfering with sleep. Continue taking gabapentin  for this condition. I do recommend  sleep study but at this time you do not want to pursue this.    INSTRUCTIONS: Please follow up with the orthopedic doctor for a knee brace. We will also schedule a follow-up chest CT scan to monitor your pulmonary nodules. Continue using your medications as directed and let us  know if you experience any new or worsening symptoms.  Follow-up: 3 months with MD / Dr. Meade is moving in December you will need to establish with new pulmonary MD for COPD

## 2024-08-19 NOTE — Progress Notes (Signed)
 @Patient  ID: Jill Spence, female    DOB: Dec 17, 1950, 74 y.o.   MRN: [id number]  Chief Complaint  Patient presents with   COPD    Worsening COPD and Respiratory failure.     Referring provider: Saguier, Edward, PA-C  HPI: 73 year old female, former smoker.  Past medical history significant for stage III COPD, chronic respiratory failure, multiple thyroid  nodules, vitamin D  deficiency.  Maintained on Trelegy and supplemental oxygen . Followed with serial CT scans for lung nodule. Patient of Dr. Theophilus.   Hospitalized for COVID-19 at [facility] in early 2021 but I do not have any record of those. Diagnosed with multinodular goiter and hypothyroidism in 2022.  She is following with endocrinology and is on methimazole   Previous [facility] pulmonary encounter: 2022 She had a CT chest and PET scan for evaluation of lung nodules PET scan showed hypermetabolic them in the right axilla.  She has been seen at dermatology and this lesion excised.  I have reviewed the previous notes from dermatology As per the patient and the final biopsy report from September 15 is benign keloid  She also had a follow-up CT chest which showed improvement in lung nodule  Pets: Has a dog Occupation: Retired midwife Exposures: Reports mold in the previous apartment.  No current mold exposure.  No hot tub, Jacuzzi Smoking history: States that she quit smoking in 2008.  Cannot give me a clear idea of how much she smoked prior Travel history: No significant travel history Relevant family history: No significant family history of lung disease  11/22/23 Discussed the use of AI scribe software for clinical note transcription with the patient, who gave verbal consent to proceed.  The patient, with a history of severe COPD, presents with worsening symptoms. She reports difficulty breathing, particularly at night, and describes a sensation of thick mucus in her throat that sometimes impedes her ability to breathe  through her nose. The patient's caregiver notes that the patient's oxygen  levels decrease significantly during movement, leading to weakness and potential risk of passing out. The patient is currently on a regimen of Trelegy, a nebulizer, and occasional prednisone , but reports difficulty with the inhaler and is not taking the prednisone  regularly due to concerns about long-term steroid use. The patient also mentions a history of receiving steroid and antibiotic injections from a previous doctor, which she believes were beneficial. The patient is scheduled for a minor surgery to remove a benign growth on her ear and expresses concern about potential interactions with her current medications.   04/07/2024 Discussed the use of AI scribe software for clinical note transcription with the patient, who gave verbal consent to proceed.  History of Present Illness   Jill Spence is a 72 year old female with severe COPD and emphysema who presents with worsening respiratory symptoms.  Over the past couple of weeks, she has experienced worsening respiratory symptoms, including increased cough and production of thick white mucus that causes choking, especially when bending down. She has difficulty breathing in cold environments and cannot tolerate air conditioning or fans.  She is currently on Trelegy and uses supplemental oxygen . She takes Trelegy between 3 AM and 6 AM, noting that the timing affects its duration of effectiveness. She also uses a flutter valve and takes Mucinex liquid to help with mucus expectoration, although she finds it hard to find. She has been experiencing frequent bowel movements, especially after eating.  She has a history of seasonal allergies and takes Singulair  (montelukast ) and  another unspecified yellow pill for allergy management. Recently, she has noticed itching in her throat and eyes, which she attributes to pollen exposure.  A CT scan from February showed advanced emphysema and  waxing and waning bilateral pulmonary nodules with at least 2 new slightly irregular nodules in the left lung. Her symptoms fluctuate, feeling well at times and then suddenly feeling unwell.  She expresses concern about her ability to participate in pulmonary rehabilitation due to transportation and environmental factors.     06/17/24 Dr, Meade Discussed the use of AI scribe software for clinical note transcription with the patient, who gave verbal consent to proceed.  History of Present Illness Jill Spence is a 73 year old female with primary fibrosis and emphysema who presents with worsening shortness of breath and choking sensations.  She experiences worsening shortness of breath both at rest and with exertion, leading to significant breathlessness even with minimal activities such as walking from one room to another or washing dishes. She begins her daily activities early to avoid feeling rushed due to her breathing difficulties. Episodes are accompanied by chest tightness and a sensation of wanting to pass out.  She describes a choking sensation that occurs mostly when eating, drinking, or lying down. No reflux or heartburn is noted. She uses oxygen  therapy at home, typically at two liters, but increasing the flow results in headaches. Oxygen  saturation levels vary depending on her activity and location within her home.  She reports having fibrosis, emphysema, and lung nodules that have been monitored over time. She uses Trelegy inhaler once daily and has albuterol  nebulizer therapy and a rescue inhaler, though these do not significantly alleviate her symptoms. She has not received the Otovel nebulizer treatment that was previously prescribed.  There is a family history of emphysema, with both her sister and uncle affected by the condition. She quit smoking over fifteen years ago and attributes some of her respiratory issues to environmental changes after moving from [place]  to the  [place].  She reports having a Baker's cyst in her left leg, identified through a recent ultrasound, and mentions a small blood clot that is being monitored. She experiences swelling in her legs and has a history of arthritis in her knee.  She had to stop pulmonary rehabilitation recently due to a respiratory infection and notes that central air conditioning exacerbates her symptoms by causing dryness and irritation in her throat and nose.     08/19/2024- Interim hx Discussed the use of AI scribe software for clinical note transcription with the patient, who gave verbal consent to proceed.  History of Present Illness Jill Spence is a 73 year old female with severe COPD with emphysema (FEV1 32%) who presents with fatigue and difficulty with pulmonary rehabilitation. She is accompanied by a caregiver, Miss Hadassah, who assists with transportation and scheduling. She presents today for 6 week follow-up after previous consultation with Dr. Meade in August.  She experiences significant fatigue and weakness, describing it as feeling 'overly tired,' which impacts her ability to participate consistently in her home-based pulmonary rehabilitation program. Sometimes she can perform the exercises, but other times she needs to rest. She is uncertain about the cause of her fatigue, stating, 'I had energy. I just don't have it anymore.'  She has been told she has severe emphysema and COPD. She uses a Trelegy inhaler daily and reports using a nebulizer inconsistently, particularly when alone. She prefers using albuterol  but has also been given Ohytuvayre. She reports difficulty with  thick mucus, which sometimes causes her to cough up 'thick stuff' unexpectedly. She uses supplemental oxygen , with a concentrator set at two liters at home and three liters for portable use. She describes discomfort with the cold air from the oxygen  tank, which she finds painful. She also mentions difficulty with nasal cannulas and  prefers using a mask, which she does not currently have.  She experiences a chronic cough, which worsens with eating, drinking, and physical activity. She uses Mucinex, specifically seeking the 'Arctic Burst' variety, which she finds effective but difficult to find. Vicks VapoRub causes skin irritation and burning.  She has a history of a right knee fracture, which causes pain and weakness, particularly with temperature changes. She needed to keep her leg elevated for over a year following the fracture and now experiences increased pain and weakness as she ages.  She reports sleep disturbances, including difficulty falling asleep, heavy sleep, and frequent awakenings due to restless leg syndrome. She has undergone a sleep study in the past, which reportedly did not indicate sleep apnea. She takes gabapentin  (Neurontin ) for restless leg syndrome. She reports restless leg syndrome and frequent awakenings at night.  Allergies  Allergen Reactions   Influenza Vaccines    Aspirin     Other Reaction(s): GI Intolerance     There is no immunization history on file for this patient.  Past Medical History:  Diagnosis Date   Anemia    low iron   Anxiety    Arthritis    Bipolar disorder (HCC)    Cancer (HCC) 1973   COPD (chronic obstructive pulmonary disease) (HCC)    Family history of adverse reaction to anesthesia    mom was put to sleep and she never woke up -    GERD (gastroesophageal reflux disease)    Guillain Barr syndrome (HCC)    numbness in toes, legs hurt   Headache    migraines as a teenager   IBS (irritable bowel syndrome)    Lump, breast    removed years ago per Pt.   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 180 mL 2   albuterol  (VENTOLIN  HFA) 108 (90 Base) MCG/ACT inhaler Inhale 2 puffs into the lungs every 6 (six) hours as needed for wheezing. 18 g 11   alclomethasone (ACLOVATE) 0.05 % cream Apply topically 2 (two) times daily as needed.     alendronate (FOSAMAX) 70 MG tablet Take 70 mg by mouth every Sunday. Take with a full glass of water on an empty stomach.     ARIPiprazole (ABILIFY) 15 MG tablet Take 15 mg by mouth daily.     azithromycin  (ZITHROMAX  Z-PAK) 250 MG tablet Per zpack instructions 6 tablet 0   B Complex Vitamins (VITAMIN B COMPLEX ) TABS  Take 1 tablet by mouth daily. 30 tablet 5   benzonatate  (TESSALON ) 100 MG capsule Take 1 capsule (100 mg total) by mouth 3 (three) times daily as needed for cough. 90 capsule 2   benztropine (COGENTIN) 0.5 MG tablet Take 0.5 mg by mouth 2 (two) times daily.     Bepotastine  Besilate (BEPREVE) 1.5 % SOLN Place 1 drop into both eyes 2 (two) times daily as needed. 10 mL 5   Bepotastine  Besilate 1.5 % SOLN Apply to eye.     cetirizine  (ZYRTEC ) 10 MG tablet Take by mouth.     clonazePAM (KLONOPIN) 0.5 MG tablet Take by mouth.     cromolyn (OPTICROM) 4 % ophthalmic solution 1 drop 4 (four) times daily.     diclofenac  Sodium (VOLTAREN  ARTHRITIS PAIN) 1 % GEL Apply 4 g topically 4 (four) times daily. 150 g 3   dicyclomine  (BENTYL ) 20 MG tablet Take 1 tablet (20 mg total) by mouth 3 (three) times daily as needed. 90 tablet 2   Ensifentrine  (OHTUVAYRE ) 3 MG/2.5ML SUSP Inhale 1 ampule into the lungs in the morning and at bedtime.     famciclovir  (FAMVIR ) 250 MG  tablet Take 1 tablet (250 mg total) by mouth 2 (two) times daily. 180 tablet 2   Fluticasone-Umeclidin-Vilant (TRELEGY ELLIPTA ) 200-62.5-25 MCG/ACT AEPB Inhale 1 puff into the lungs daily. 180 each 2   gabapentin  (NEURONTIN ) 400 MG capsule Take 3 capsules (1,200 mg total) by mouth 3 (three) times daily. 270 capsule 2   hydrOXYzine  (ATARAX ) 10 MG tablet Take 1-2 tablets (10-20 mg total) by mouth at bedtime as needed for anxiety (insomnia). 180 tablet 0   ibuprofen  (ADVIL ) 200 MG tablet 1-4 tab po every 8 hours prn moderate pain 60 tablet 2   ibuprofen  (ADVIL ) 600 MG tablet Take 1 tablet (600 mg total) by mouth 2 (two) times daily as needed for knee pain. 30 tablet 0   ipratropium-albuterol  (DUONEB) 0.5-2.5 (3) MG/3ML SOLN Take 3 mLs by nebulization every 4 (four) hours as needed. 360 mL 2   levocetirizine (XYZAL ) 5 MG tablet Take 1 tablet (5 mg total) by mouth every evening. 90 tablet 2   levocetirizine (XYZAL ) 5 MG tablet Take 1 tablet (5 mg total) by mouth every evening. 30 tablet 3   loperamide  (IMODIUM ) 2 MG capsule TAKE ONE CAPSULE BY MOUTH EVERY 6 TO 8 HOURS AS NEEDED FOR DIARRHEA 30 capsule 5   losartan  (COZAAR ) 25 MG tablet Take 1 tablet (25 mg total) by mouth daily. 90 tablet 2   magnesium oxide (MAG-OX) 400 MG tablet Take 250 mg by mouth daily.     methimazole  (TAPAZOLE ) 5 MG tablet TAKE ONE TABLET BY MOUTH DAILY 90 tablet 1   montelukast  (SINGULAIR ) 10 MG tablet Take 1 tablet (10 mg total) by mouth at bedtime. 90 tablet 3   mupirocin  ointment (BACTROBAN ) 2 % Apply 1 Application topically 2 (two) times daily. 22 g 0   NONFORMULARY OR COMPOUNDED ITEM Elevated toilet seat 2 each 0   olopatadine  (PATANOL) 0.1 % ophthalmic solution Place 1 drop into both eyes 2 (two) times daily. 5 mL 3   olopatadine  (PATANOL) 0.1 % ophthalmic solution Place 1 drop into both eyes 2 (two) times daily. 5 mL 12   oxcarbazepine (TRILEPTAL) 600 MG tablet Take 600 mg by mouth 2 (two) times daily.     oxyCODONE   (ROXICODONE ) 5 MG immediate release tablet Take 1 tablet (5 mg total) by mouth 3 (three) times daily as needed for  moderate pain (pain score 4-6). 90 tablet 0   pantoprazole  (PROTONIX ) 40 MG tablet Take 1 tablet (40 mg total) by mouth daily. 90 tablet 3   potassium chloride  SA (KLOR-CON  M) 20 MEQ tablet Take 1 tablet (20 mEq total) by mouth 2 (two) times daily for 7 days. 14 tablet 0   predniSONE  (DELTASONE ) 10 MG tablet Take 1 tablet (10 mg total) by mouth as needed. 20 tablet 0   predniSONE  (DELTASONE ) 10 MG tablet 4 tabs for 2 days, then 3 tabs for 2 days, 2 tabs for 2 days, then 1 tab for 2 days, then stop 20 tablet 0   sodium chloride  HYPERTONIC 3 % nebulizer solution 3 ml via neb twice daily 750 mL 12   traZODone (DESYREL) 50 MG tablet Take 50 mg by mouth at bedtime.     triamcinolone  cream (KENALOG ) 0.1 % Apply topically 2 (two) times daily as needed. 30 g 3   Vitamin D , Ergocalciferol , (DRISDOL ) 1.25 MG (50000 UNIT) CAPS capsule Take 1 capsule (50,000 Units total) by mouth every 7 (seven) days. 4 capsule 2   Vitamin D , Ergocalciferol , (DRISDOL ) 1.25 MG (50000 UNIT) CAPS capsule 1 capsule per month 3 capsule 0   Vitamin D , Ergocalciferol , (DRISDOL ) 1.25 MG (50000 UNIT) CAPS capsule TAKE ONE CAPSULE BY MOUTH ONCE WEEKLY FOR SIX WEEKS 6 capsule 0   benzonatate  (TESSALON ) 100 MG capsule TAKE ONE CAPSULE BY MOUTH THREE TIMES DAILY AS NEEDED FOR COUGH (Patient not taking: Reported on 08/19/2024) 30 capsule 2   triamcinolone  cream (KENALOG ) 0.1 % Apply topically 2 (two) times daily. (Patient not taking: Reported on 08/19/2024) 30 g 2   No facility-administered medications prior to visit.      Review of Systems  Review of Systems  Constitutional:  Positive for fatigue.  Respiratory:  Positive for cough and shortness of breath.      Physical Exam  BP 124/64   Pulse 73   Temp 97.6 F (36.4 C)   Ht 5' 3.5 (1.613 m)   Wt 167 lb 3.2 oz (75.8 kg)   SpO2 96% Comment: 3L O2 POC  BMI 29.15  kg/m  Physical Exam Constitutional:      General: She is not in acute distress.    Comments: Chronically ill appearing  HENT:     Head: Normocephalic and atraumatic.  Cardiovascular:     Rate and Rhythm: Normal rate.  Pulmonary:     Effort: Pulmonary effort is normal.     Breath sounds: Rales present. No wheezing.  Skin:    General: Skin is warm and dry.  Neurological:     General: No focal deficit present.     Mental Status: She is alert and oriented to person, place, and time. Mental status is at baseline.  Psychiatric:        Behavior: Behavior normal.        Thought Content: Thought content normal.        Judgment: Judgment normal.    Lab Results:  CBC    Component Value Date/Time   WBC 8.2 01/02/2024 1419   RBC 4.12 01/02/2024 1419   HGB 13.2 01/02/2024 1419   HCT 40.0 01/02/2024 1419   PLT 300.0 01/02/2024 1419   MCV 97.1 01/02/2024 1419   MCH 31.4 02/03/2020 0945   MCHC 33.1 01/02/2024 1419   RDW 14.9 01/02/2024 1419   LYMPHSABS 2.5 01/02/2024 1419   MONOABS 0.6 01/02/2024 1419   EOSABS 0.1 01/02/2024 1419   BASOSABS 0.1 01/02/2024  1419    BMET    Component Value Date/Time   NA 138 07/09/2024 1307   K 4.9 07/09/2024 1307   CL 102 07/09/2024 1307   CO2 31 07/09/2024 1307   GLUCOSE 91 07/09/2024 1307   BUN 13 07/09/2024 1307   CREATININE 0.62 07/09/2024 1307   CREATININE 0.59 (L) 04/25/2024 1613   CALCIUM 9.4 07/09/2024 1307   GFRNONAA >60 02/03/2020 0945   GFRAA >60 02/03/2020 0945    BNP No results found for: BNP  ProBNP    Component Value Date/Time   PROBNP 124.0 (H) 01/02/2024 1419    Imaging: No results found.   Assessment & Plan:   No problem-specific Assessment & Plan notes found for this encounter.   1. Stage 3 severe COPD by GOLD classification (HCC) (Primary)  2. Chronic respiratory failure with hypoxia (HCC)   Assessment and Plan Assessment & Plan Chronic obstructive pulmonary disease (COPD) with severe emphysema  and chronic respiratory failure with hypoxia Severe emphysema with lung function at 32% of predicted. Intermittent use of nebulizer and oxygen  therapy. Difficulty with nebulizer use due to lack of understanding and financial constraints. Oxygen  therapy includes home concentrator at 2 liters and portable tank at 3 liters. Complaints of cold air from the tank causing discomfort. Nocturnal symptoms include difficulty sleeping and restless leg syndrome. - Encourage consistent use of Ohtuvayre  nebulizer twice daily, morning and evening. - Provide two nebulizer tubings today. - Order oxygen  mask from medical supply store ([facility]). - Educate on using nebulizer with Trelegy inhaler. - Discuss potential use of oxygen  mask to alleviate discomfort from cold air. - Advise use of water-based lubricant for nasal dryness  Chronic cough with sputum production  Chronic cough with sputum production, worsened by certain activities and environmental factors. Fatigue possibly related to COPD and sleep disturbances. Difficulty finding preferred Mucinex with Arctic Burst. Advised against menthol products due to potential worsening of cough. - Recommend liquid Robitussin or mucinex-dm to loosen congestion. - Advise against menthol products for respiratory issues.  Pulmonary nodules under surveillance Pulmonary nodules with waxing and waning pattern, possibly due to infection or inflammation. Last CT scan in March showed new nodules. - Order follow-up chest CT scan to monitor changes  - Coordinate scheduling with patient and caregiver  Restless legs syndrome Restless legs syndrome contributing to sleep disturbances. Currently on gabapentin  (Neurontin ) for management. Discussion of new TENS therapy device for restless leg syndrome, but coverage and cost are uncertain. - Continue gabapentin  for restless leg syndrome. - Discuss potential use of TENS therapy device for restless leg syndrome.  Recording duration: 23  minutes   Almarie LELON Ferrari, NP 08/19/2024

## 2024-08-22 ENCOUNTER — Ambulatory Visit: Admitting: Medical

## 2024-08-22 VITALS — BP 120/84 | HR 82 | Temp 98.0°F | Resp 16 | Ht 63.5 in | Wt 167.8 lb

## 2024-08-22 DIAGNOSIS — F439 Reaction to severe stress, unspecified: Secondary | ICD-10-CM

## 2024-08-22 DIAGNOSIS — E559 Vitamin D deficiency, unspecified: Secondary | ICD-10-CM

## 2024-08-22 DIAGNOSIS — I10 Essential (primary) hypertension: Secondary | ICD-10-CM | POA: Diagnosis not present

## 2024-08-22 DIAGNOSIS — R944 Abnormal results of kidney function studies: Secondary | ICD-10-CM

## 2024-08-22 DIAGNOSIS — J441 Chronic obstructive pulmonary disease with (acute) exacerbation: Secondary | ICD-10-CM | POA: Diagnosis not present

## 2024-08-22 DIAGNOSIS — G8929 Other chronic pain: Secondary | ICD-10-CM | POA: Diagnosis not present

## 2024-08-22 DIAGNOSIS — G2581 Restless legs syndrome: Secondary | ICD-10-CM

## 2024-08-22 DIAGNOSIS — M25561 Pain in right knee: Secondary | ICD-10-CM

## 2024-08-22 DIAGNOSIS — J449 Chronic obstructive pulmonary disease, unspecified: Secondary | ICD-10-CM

## 2024-08-22 DIAGNOSIS — J309 Allergic rhinitis, unspecified: Secondary | ICD-10-CM | POA: Diagnosis not present

## 2024-08-22 MED ORDER — VITAMIN D (ERGOCALCIFEROL) 1.25 MG (50000 UNIT) PO CAPS: ORAL_CAPSULE | ORAL | 0 refills | Status: AC

## 2024-08-22 MED ORDER — LOSARTAN POTASSIUM 50 MG PO TABS: 50.0000 mg | ORAL_TABLET | Freq: Every day | ORAL | 3 refills | Status: AC

## 2024-08-22 NOTE — Progress Notes (Signed)
 Subjective:    Patient ID: Jill Spence, female    DOB: March 10, 1951, 73 y.o.   MRN: [id number]  HPI  Pt in for follow up.  Left knee Baker's cyst with pain Small Baker's cyst causing significant pain, no clot on ultrasound. She prefers to avoid procedures. - Refer to orthopedist if pain worsens. - Repeat ultrasound if pain increases to assess for cyst enlargement. - Prescribe ibuprofen  600 mg for pain management, pending kidney function review.   Chronic low back pain, sacroiliac region Chronic low back pain in sacroiliac region, severe and movement-exacerbated. Lidocaine  patches not preferred. - Use ibuprofen  600 mg for pain relief. - Apply heating pad to affected area as needed.   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 Severe emphysema. Azithromycin  effective in past. - Prescribe azithromycin  (Z-Pak) if chest congestion worsens and sputum remains colored. - Continue Trelegy and albuterol  for COPD management. - Attempt to find Mucinex [employer] for mucus thinning. - Consider chest x-ray if symptoms worsen.   Restless legs syndrome Continued management with gabapentin .   Decreased appetite in elderly Decreased appetite, common in elderly. - Continue Boost three times daily.  Since last visit pt has followed up also with pulmonologist, neurologist and endocrinologist.  VISIT SUMMARY: During your visit, we discussed your ongoing issues with severe emphysema, COPD, chronic cough, fatigue, and restless leg syndrome. We reviewed your current treatments and made some adjustments to help manage your symptoms better. We also talked about your difficulties with pulmonary rehabilitation and sleep disturbances.    Pulmonolgist  YOUR PLAN: -CHRONIC OBSTRUCTIVE PULMONARY DISEASE (COPD) WITH SEVERE EMPHYSEMA AND CHRONIC RESPIRATORY FAILURE WITH  HYPOXIA: COPD and severe emphysema are lung conditions that make it hard to breathe. We recommend you use the Otuvair nebulizer twice daily, in the morning and evening. We provided you with two new nebulizer tubings today. We will order an oxygen  mask from the medical supply store to help with the discomfort from the cold air. Please use the nebulizer along with your Trelegy inhaler. We also discussed the potential use of an oxygen  mask to alleviate discomfort from the cold air. Additionally, we will refer you to an orthopedic doctor for a knee brace.   -CHRONIC COUGH WITH SPUTUM PRODUCTION AND FATIGUE: Your chronic cough and fatigue may be related to your COPD and sleep disturbances. We recommend using liquid Robitussin to help loosen congestion and advise against using menthol products as they may worsen your cough. If you cannot find your preferred Mucinex with [employer], consider using Mucinex DM.   -PULMONARY NODULES UNDER SURVEILLANCE: Pulmonary nodules are small growths in the lungs that need to be monitored. We will order a follow-up chest CT scan to check for any changes and rule out any serious conditions. We will coordinate the scheduling of this scan with you and your caregiver.   -RESTLESS LEGS SYNDROME: Restless legs syndrome causes an uncontrollable urge to move your legs, often interfering with sleep. Continue taking gabapentin  for this condition. I do recommend  sleep study but at this time you do not want to pursue this.    INSTRUCTIONS: Please follow up with the orthopedic doctor for a knee brace. We will also schedule a follow-up chest CT scan to monitor your pulmonary nodules. Continue using your medications as directed and let us  know if you experience any new or worsening symptoms.  Neurologist A/P  Guillain Barre Syndrome:  Continue current medication regimen. Continue Outpatient Therapy. Continue current medication regimen. Continue to Monitor. 08/14/2024 Cervicalgia:  Continue HEP as Tolerated. Continue to monitor.08/14/2024 Polyarthralgia: Continue current medication regimen . Continue to Monitor. 08/14/2024 Chronic Bilateral Low Back Pain: Continue HEP as Tolerated. Continue to Monitor. 08/14/2024 Chronic Pain of Bilateral Knee Pain R>L: Continue Outpatient Therapy. Continue current medication regimen. Continue to Monitor. 08/14/2024 Chronic Pain Syndrome:  Continue Oxycodone  5 mg three times a day as needed for pain. #90.  We will continue the opioid monitoring program, this consists of regular clinic visits, examinations, urine drug screen, pill counts as well as use of [place]  Controlled Substance Reporting system. A 12 month History has been reviewed on the   Controlled Substance Reporting System on 08/14/2024   Endocrinologist note below from visit. ASSESSMENT: 1.  Toxic adenoma with thyrotoxicosis   2.  Thyroid  nodules   3.  Vitamin D  deficiency - Per PCP   PLAN:  1. Patient with history of thyrotoxicosis, previously with thyrotoxic symptoms including weight loss, heat intolerance, hyperdefecation, palpitations, anxiety, all resolved on methimazole . - She had a thyroid  uptake and scan in 2022 and it showed a left hyperfunctioning thyroid  nodule as a possible cause for her thyrotoxicosis - She continues on 5 mg of methimazole  daily with good tolerance - She had a slightly low TSH, 0.26 in 01/2023 possibly due to taking prednisone  the night before.  We did not change the regimen and subsequent TFTs were normal.  Latest levels are from 01/2024. -We discussed again about options for treatment for her thyrotoxicosis and she agreed for RAI treatment if needed, but we did not have to use it -At today's visit, she has no thyrotoxic signs or symptoms. -Will recheck her TFTs and change the methimazole  dose accordingly -I will see her back in 1 year but, per her preference, we will recheck her TFTs in 6 months    Jill Spence is a  73 year old female with hypertension who presents with elevated blood pressure readings.  She has been experiencing elevated blood pressure readings, with a recent measurement of 170/104 mmHg. Her blood pressure has been high on other days as well, leading to a previous call for an ambulance. She did not call an ambulance the previous night because she consulted with [facility]. SPt was  taking losartan  25 mg and recently doubled the dose to 50 mg, taking 25 mg in the morning and another 25 mg in the evening. Her bp came significantly to today controlled level. She had no side effects wit losartan  higher dose.   She supplements her diet with Boost to maintain her weight, which has been stable at 167 lbs. She is also taking gabapentin  for restless leg syndrome, three times a day. She mentions a referral to a doctor in [place] for her knee but prefers to see her local doctor for a new knee brace as her right knee pain has worsened over time. She has a history of using a knee brace, which she lost during a move four years ago.  She has a history of Guillain-Barr syndrome and reports adverse reactions to vaccines, including the flu and pneumonia vaccines, which she associates with her condition. She is on Trelegy and oxygen  therapy for copd and has been provided with supplies such as tubing and masks. She mentions a recent call regarding a CT scan for pulmonary nodules.  She is also on vitamin D  supplementation, with a recent level of 92, and takes levocetirizine for  allergic rhinitis. She reports difficulty finding Mucinex (artic blast formulation). She states this helps alot in the past.  She has a history of thyroid  nodules being monitored by an endocrinologist, with a recheck scheduled in six months.   She also mentions chronic pain syndrome and is under pain management for this condition.    Review of Systems  Constitutional:  Negative for chills and fatigue.  HENT:  Negative for congestion and ear  pain.   Respiratory:  Negative for cough, chest tightness and stridor.   Cardiovascular:  Negative for chest pain and palpitations.  Gastrointestinal:  Negative for abdominal pain and blood in stool.  Genitourinary:  Negative for dysuria and frequency.  Musculoskeletal:  Negative for back pain, myalgias and neck stiffness.  Skin:  Negative for rash.  Neurological:  Negative for dizziness, syncope and light-headedness.  Hematological:  Negative for adenopathy. Does not bruise/bleed easily.  Psychiatric/Behavioral:  Negative for behavioral problems, dysphoric mood and sleep disturbance.     Past Medical History:  Diagnosis Date   Anemia    low iron   Anxiety    Arthritis    Bipolar disorder (HCC)    Cancer (HCC) 1973   COPD (chronic obstructive pulmonary disease) (HCC)    Family history of adverse reaction to anesthesia    mom was put to sleep and she never woke up -    GERD (gastroesophageal reflux disease)    Guillain Barr syndrome (HCC)    numbness in toes, legs hurt   Headache    migraines as a teenager   IBS (irritable bowel syndrome)    Lump, breast    removed years ago per Pt.   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, has quiet' seizures   Thyroid  disease      Social History   Socioeconomic History   Marital status: Widowed    Spouse name: Not on file   Number of children: Not on file   Years of education: Not on file   Highest education level: Some college, no degree  Occupational History   Not on file  Tobacco Use   Smoking status: Former    Current packs/day: 0.00    Average packs/day: 0.3 packs/day for 6.0 years (1.5 ttl pk-yrs)    Types: Cigarettes    Start date: 10/13/2001    Quit date: 10/14/2007    Years since quitting: 16.8   Smokeless tobacco: Never  Vaping Use   Vaping status: Never Used  Substance and Sexual Activity   Alcohol use: No   Drug use: No   Sexual  activity: Not on file  Other Topics Concern   Not on file  Social History Narrative   Not on file   Social Drivers of Health   Financial Resource Strain: Low Risk  (06/19/2024)   Overall Financial Resource Strain (CARDIA)    Difficulty of Paying Living Expenses: Not hard at all  Food Insecurity: No Food Insecurity (06/19/2024)   Hunger Vital Sign    Worried About Running Out of Food in the Last Year: Never true    Ran Out of Food in the Last Year: Never true  Transportation Needs: No Transportation Needs (06/19/2024)   PRAPARE - Administrator, Civil Service (Medical): No    Lack of Transportation (Non-Medical): No  Physical Activity: Inactive (06/19/2024)   Exercise Vital Sign  Days of Exercise per Week: 0 days    Minutes of Exercise per Session: 0 min  Stress: Stress Concern Present (06/19/2024)   [employer] of Occupational Health - Occupational Stress Questionnaire    Feeling of Stress: Rather much  Social Connections: Socially Isolated (06/19/2024)   Social Connection and Isolation Panel    Frequency of Communication with Friends and Family: More than three times a week    Frequency of Social Gatherings with Friends and Family: Once a week    Attends Religious Services: Never    Database administrator or Organizations: No    Attends Banker Meetings: Never    Marital Status: Widowed  Intimate Partner Violence: Not At Risk (06/19/2024)   Humiliation, Afraid, Rape, and Kick questionnaire    Fear of Current or Ex-Partner: No    Emotionally Abused: No    Physically Abused: No    Sexually Abused: No    Past Surgical History:  Procedure Laterality Date   BREAST SURGERY     COLONOSCOPY     EYE SURGERY Bilateral    cataract surgery with lens implants   MANDIBLE RECONSTRUCTION     MINOR REMOVAL OF MANDIBULAR HARDWARE Right 10/15/2015   Procedure: MINOR REMOVAL OF Right MANDIBULAR HARDWARE;  Surgeon: Glendia Primrose, DDS;  Location: [facility] OR;  Service:  Oral Surgery;  Laterality: Right;   TONSILLECTOMY      Family History  Problem Relation Age of Onset   Cancer Father    Emphysema Sister     Allergies  Allergen Reactions   Influenza Vaccines    Aspirin     Other Reaction(s): GI Intolerance    Current Outpatient Medications on File Prior to Visit  Medication Sig Dispense Refill   albuterol  (PROVENTIL ) (2.5 MG/3ML) 0.083% nebulizer solution Take 3 mLs (2.5 mg total) by nebulization every 6 (six) hours as needed for wheezing. 180 mL 2   albuterol  (VENTOLIN  HFA) 108 (90 Base) MCG/ACT inhaler Inhale 2 puffs into the lungs every 6 (six) hours as needed for wheezing. 18 g 11   alclomethasone (ACLOVATE) 0.05 % cream Apply topically 2 (two) times daily as needed.     alendronate (FOSAMAX) 70 MG tablet Take 70 mg by mouth every Sunday. Take with a full glass of water on an empty stomach.     ARIPiprazole (ABILIFY) 15 MG tablet Take 15 mg by mouth daily.     azithromycin  (ZITHROMAX  Z-PAK) 250 MG tablet Per zpack instructions 6 tablet 0   B Complex Vitamins (VITAMIN B COMPLEX ) TABS Take 1 tablet by mouth daily. 30 tablet 5   benzonatate  (TESSALON ) 100 MG capsule Take 1 capsule (100 mg total) by mouth 3 (three) times daily as needed for cough. 90 capsule 2   benztropine (COGENTIN) 0.5 MG tablet Take 0.5 mg by mouth 2 (two) times daily.     Bepotastine  Besilate (BEPREVE) 1.5 % SOLN Place 1 drop into both eyes 2 (two) times daily as needed. 10 mL 5   Bepotastine  Besilate 1.5 % SOLN Apply to eye.     cetirizine  (ZYRTEC ) 10 MG tablet Take by mouth.     clonazePAM (KLONOPIN) 0.5 MG tablet Take by mouth.     cromolyn (OPTICROM) 4 % ophthalmic solution 1 drop 4 (four) times daily.     diclofenac  Sodium (VOLTAREN  ARTHRITIS PAIN) 1 % GEL Apply 4 g topically 4 (four) times daily. 150 g 3   dicyclomine  (BENTYL ) 20 MG tablet Take 1 tablet (20 mg total) by  mouth 3 (three) times daily as needed. 90 tablet 2   Ensifentrine  (OHTUVAYRE ) 3 MG/2.5ML SUSP Inhale  1 ampule into the lungs in the morning and at bedtime.     famciclovir  (FAMVIR ) 250 MG tablet Take 1 tablet (250 mg total) by mouth 2 (two) times daily. 180 tablet 2   Fluticasone-Umeclidin-Vilant (TRELEGY ELLIPTA ) 200-62.5-25 MCG/ACT AEPB Inhale 1 puff into the lungs daily. 180 each 2   gabapentin  (NEURONTIN ) 400 MG capsule Take 3 capsules (1,200 mg total) by mouth 3 (three) times daily. 270 capsule 2   hydrOXYzine  (ATARAX ) 10 MG tablet Take 1-2 tablets (10-20 mg total) by mouth at bedtime as needed for anxiety (insomnia). 180 tablet 0   ibuprofen  (ADVIL ) 200 MG tablet 1-4 tab po every 8 hours prn moderate pain 60 tablet 2   ibuprofen  (ADVIL ) 600 MG tablet Take 1 tablet (600 mg total) by mouth 2 (two) times daily as needed for knee pain. 30 tablet 0   ipratropium-albuterol  (DUONEB) 0.5-2.5 (3) MG/3ML SOLN Take 3 mLs by nebulization every 4 (four) hours as needed. 360 mL 2   levocetirizine (XYZAL ) 5 MG tablet Take 1 tablet (5 mg total) by mouth every evening. 90 tablet 2   levocetirizine (XYZAL ) 5 MG tablet Take 1 tablet (5 mg total) by mouth every evening. 30 tablet 3   loperamide  (IMODIUM ) 2 MG capsule TAKE ONE CAPSULE BY MOUTH EVERY 6 TO 8 HOURS AS NEEDED FOR DIARRHEA 30 capsule 5   magnesium oxide (MAG-OX) 400 MG tablet Take 250 mg by mouth daily.     methimazole  (TAPAZOLE ) 5 MG tablet TAKE ONE TABLET BY MOUTH DAILY 90 tablet 1   montelukast  (SINGULAIR ) 10 MG tablet Take 1 tablet (10 mg total) by mouth at bedtime. 90 tablet 3   mupirocin  ointment (BACTROBAN ) 2 % Apply 1 Application topically 2 (two) times daily. 22 g 0   NONFORMULARY OR COMPOUNDED ITEM Elevated toilet seat 2 each 0   olopatadine  (PATANOL) 0.1 % ophthalmic solution Place 1 drop into both eyes 2 (two) times daily. 5 mL 3   olopatadine  (PATANOL) 0.1 % ophthalmic solution Place 1 drop into both eyes 2 (two) times daily. 5 mL 12   oxcarbazepine (TRILEPTAL) 600 MG tablet Take 600 mg by mouth 2 (two) times daily.     oxyCODONE   (ROXICODONE ) 5 MG immediate release tablet Take 1 tablet (5 mg total) by mouth 3 (three) times daily as needed for moderate pain (pain score 4-6). 90 tablet 0   pantoprazole  (PROTONIX ) 40 MG tablet Take 1 tablet (40 mg total) by mouth daily. 90 tablet 3   potassium chloride  SA (KLOR-CON  M) 20 MEQ tablet Take 1 tablet (20 mEq total) by mouth 2 (two) times daily for 7 days. 14 tablet 0   predniSONE  (DELTASONE ) 10 MG tablet Take 1 tablet (10 mg total) by mouth as needed. 20 tablet 0   predniSONE  (DELTASONE ) 10 MG tablet 4 tabs for 2 days, then 3 tabs for 2 days, 2 tabs for 2 days, then 1 tab for 2 days, then stop 20 tablet 0   sodium chloride  HYPERTONIC 3 % nebulizer solution 3 ml via neb twice daily 750 mL 12   traZODone (DESYREL) 50 MG tablet Take 50 mg by mouth at bedtime.     triamcinolone  cream (KENALOG ) 0.1 % Apply topically 2 (two) times daily as needed. 30 g 3   Vitamin D , Ergocalciferol , (DRISDOL ) 1.25 MG (50000 UNIT) CAPS capsule Take 1 capsule (50,000 Units total) by mouth every  7 (seven) days. 4 capsule 2   Vitamin D , Ergocalciferol , (DRISDOL ) 1.25 MG (50000 UNIT) CAPS capsule 1 capsule per month 3 capsule 0   benzonatate  (TESSALON ) 100 MG capsule TAKE ONE CAPSULE BY MOUTH THREE TIMES DAILY AS NEEDED FOR COUGH (Patient not taking: Reported on 08/22/2024) 30 capsule 2   triamcinolone  cream (KENALOG ) 0.1 % Apply topically 2 (two) times daily. (Patient not taking: Reported on 08/22/2024) 30 g 2   No current facility-administered medications on file prior to visit.    BP 120/84   Pulse 82   Temp 98 F (36.7 C) (Oral)   Resp 16   Ht 5' 3.5 (1.613 m)   Wt 167 lb 12.8 oz (76.1 kg)   SpO2 95%   BMI 29.26 kg/m        Objective:   Physical Exam   General- No acute distress. Pleasant patient. Neck- Full range of motion, no jvd Lungs- Clear, even and unlabored. Heart- regular rate and rhythm. Neurologic- CNII- XII grossly intact.  Lower ext- calf symmetric, negative homans signs. No  pedal edema.     Assessment & Plan:   Patient Instructions  Hypertension Hypertension with recent elevated readings. Increased losartan  to 50 mg improved control. Discussed stroke risk with BP >160 mmHg. No expected side effects from increased dosage. - Prescribe losartan  50 mg once daily. - Educated on maintaining calm to avoid BP spikes.  Right knee pain Chronic right knee pain, worsening with age and cold weather. Requires new knee brace. - Refer to sports medicine for knee brace evaluation and fitting.  Chronic pain syndrome Managed by neurologist with oxycodone . - Continue gabapentin  as prescribed.  Restless legs syndrome Managed with gabapentin . - Continue gabapentin  as prescribed.  Pulmonary nodules Followed by pulmonology. - Coordinate CT chest scan to monitor nodules.  Severe stage 3 copd. -continue 02, nebulizers and trelegy. -keep regular follow up appointments with pulmologist.  Allergic rhinitis Managed with levocetirizine. - Continue levocetirizine as prescribed.  Vitamin D  deficiency Vitamin D  levels in higher range of normal. Wishes to continue supplementation. - Continue current vitamin D  supplementation.  Refilled med today.  Follow up 3 month or sooner if needed. Try to get 1 pm appointment time.    I personally spent a total of 45 minutes in the care of the patient today including getting/reviewing separately obtained history, performing a medically appropriate exam/evaluation, counseling and educating, and documenting clinical information in the EHR.

## 2024-08-22 NOTE — Patient Instructions (Addendum)
 Hypertension Hypertension with recent elevated readings. Increased losartan  to 50 mg improved control. Discussed stroke risk with BP >160 mmHg. No expected side effects from increased dosage. - Prescribe losartan  50 mg once daily. - Educated on maintaining calm to avoid BP spikes.  Right knee pain Chronic right knee pain, worsening with age and cold weather. Requires new knee brace. - Refer to sports medicine for knee brace evaluation and fitting.  Chronic pain syndrome Managed by neurologist with oxycodone . - Continue gabapentin  as prescribed.  Restless legs syndrome Managed with gabapentin . - Continue gabapentin  as prescribed.  Pulmonary nodules Followed by pulmonology. - Coordinate CT chest scan to monitor nodules.  Severe stage 3 copd. -continue 02, nebulizers and trelegy. -keep regular follow up appointments with pulmologist.  Allergic rhinitis Managed with levocetirizine. - Continue levocetirizine as prescribed.  Vitamin D  deficiency Vitamin D  levels in higher range of normal. Wishes to continue supplementation. - Continue current vitamin D  supplementation.  Refilled med today.  Follow up 3 month or sooner if needed. Try to get 1 pm appointment time.

## 2024-08-23 ENCOUNTER — Other Ambulatory Visit (HOSPITAL_BASED_OUTPATIENT_CLINIC_OR_DEPARTMENT_OTHER)

## 2024-09-01 ENCOUNTER — Other Ambulatory Visit: Payer: Self-pay | Admitting: Medical

## 2024-09-01 MED ORDER — IBUPROFEN 600 MG PO TABS: 600.0000 mg | ORAL_TABLET | Freq: Two times a day (BID) | ORAL | 0 refills | Status: DC | PRN

## 2024-09-01 NOTE — Telephone Encounter (Signed)
 Copied from CRM #[id number]. Topic: Clinical - Medication Refill >> Sep 01, 2024 10:51 AM Chiquita SQUIBB wrote: Medication: ibuprofen  (ADVIL ) 600 MG tablet  Patient stated she would like all of her medications that are due but could not provide the names of the medications she just kept stating all of them.   Has the patient contacted their pharmacy? Yes (Agent: If no, request that the patient contact the pharmacy for the refill. If patient does not wish to contact the pharmacy document the reason why and proceed with request.) (Agent: If yes, when and what did the pharmacy advise?)  This is the patient's preferred pharmacy:  [facility] [address] [place] - [id number] [place] [address] Phone: [phone] Fax: [phone]   Is this the correct pharmacy for this prescription? Yes If no, delete pharmacy and type the correct one.   Has the prescription been filled recently? No  Is the patient out of the medication? Yes  Has the patient been seen for an appointment in the last year OR does the patient have an upcoming appointment? Yes  Can we respond through MyChart? Yes  Agent: Please be advised that Rx refills may take up to 3 business days. We ask that you follow-up with your pharmacy.

## 2024-09-02 DIAGNOSIS — J449 Chronic obstructive pulmonary disease, unspecified: Secondary | ICD-10-CM | POA: Diagnosis not present

## 2024-09-05 ENCOUNTER — Telehealth: Payer: Self-pay | Admitting: Medical

## 2024-09-05 ENCOUNTER — Other Ambulatory Visit: Admitting: Licensed Clinical Social Worker

## 2024-09-05 NOTE — Telephone Encounter (Signed)
 Will hold for CMA return.

## 2024-09-05 NOTE — Telephone Encounter (Signed)
 Copied from CRM [id number]. Topic: General - Other >> Sep 05, 2024 10:58 AM Alfonso ORN wrote: Reason for CRM: PT called back to also request a copy of the letter her pcp submitted months ago to her electric company regarding her disability.

## 2024-09-05 NOTE — Telephone Encounter (Signed)
 Copied from CRM #[id number]. Topic: General - Call Back - No Documentation >> Sep 05, 2024 10:20 AM Rea BROCKS wrote: Reason for CRM: Patient needs a letter from provider stating that she is handicap, why she is handicap, a letter on her condition. 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 Letter is needed as soon as possible. Patient pays rent today and needs letter as soon as possible. And would appreciate a call when the letters been sent.   Letter should go to her mailing address:  [address] [place] [address]  [phone]) >> Sep 05, 2024 11:06 AM Alfonso ORN wrote: Pt called back regarding letter request. She was advised that the letter must also state that she is permanently disabled so that she doesn't have to repeat this process every 6 months.  >> Sep 05, 2024 10:27 AM Rea C wrote: Patient want to make sure it is noted in the letter that she has emphysema, scar tissue of the lungs, and bronchitis. And, that she also has restless leg syndrome and guillain barre syndrome. She would like these mentioned in the letter.

## 2024-09-09 ENCOUNTER — Other Ambulatory Visit: Payer: Self-pay | Admitting: Medical

## 2024-09-09 NOTE — Telephone Encounter (Signed)
 Its the life support letter for the electric company so they wont turn her power off

## 2024-09-09 NOTE — Patient Instructions (Signed)
 Visit Information  Thank you for taking time to visit with me today. Please don't hesitate to contact me if I can be of assistance to you before our next scheduled appointment.  Your next care management appointment is by telephone on 11/07 at 2 PM  Please call the care guide team at [phone] if you need to cancel, schedule, or reschedule an appointment.   Please call the Suicide and Crisis Lifeline: 988 go to [facility] [address] [place] [phone]) call 911 if you are experiencing a Mental Health or Behavioral Health Crisis or need someone to talk to.  Rolin Kerns, LCSW [place]  [facility], [facility] Clinical Social Worker Direct Dial: [phone]  Fax: [phone] Website: [email or web address] 5:55 AM

## 2024-09-09 NOTE — Telephone Encounter (Signed)
 01/22/2023 * letter but no letter has been made in regards for rent just air filters and electric company

## 2024-09-09 NOTE — Telephone Encounter (Signed)
 08/22/2023 letter

## 2024-09-09 NOTE — Patient Outreach (Signed)
 Complex Care Management   Visit Note  09/05/2024  Name:  Jill Spence MRN: [id number] DOB: 1951-04-02  Situation: Referral received for Complex Care Management related to Stress I obtained verbal consent from Patient.  Visit completed with Patient  on the phone  Background:   Past Medical History:  Diagnosis Date   Anemia    low iron   Anxiety    Arthritis    Bipolar disorder (HCC)    Cancer (HCC) 1973   COPD (chronic obstructive pulmonary disease) (HCC)    Family history of adverse reaction to anesthesia    mom was put to sleep and she never woke up -    GERD (gastroesophageal reflux disease)    Guillain Barr syndrome    numbness in toes, legs hurt   Headache    migraines as a teenager   IBS (irritable bowel syndrome)    Lump, breast    removed years ago per Pt.   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, has quiet' seizures   Thyroid  disease     Assessment: Patient Reported Symptoms:  Cognitive Cognitive Status: Alert and oriented to person, place, and time, No symptoms reported, Normal speech and language skills      Neurological Neurological Review of Symptoms: Not assessed    HEENT HEENT Symptoms Reported: Not assessed      Cardiovascular Cardiovascular Symptoms Reported: Not assessed    Respiratory Respiratory Symptoms Reported: No symptoms reported Respiratory Management Strategies: Coping strategies, Oxygen  therapy  Endocrine Endocrine Symptoms Reported: Not assessed    Gastrointestinal Gastrointestinal Symptoms Reported: Not assessed      Genitourinary Genitourinary Symptoms Reported: Not assessed    Integumentary Integumentary Symptoms Reported: Not assessed    Musculoskeletal Musculoskelatal Symptoms Reviewed: Not assessed        Psychosocial Psychosocial Symptoms Reported: Other Other Psychosocial Conditions: Stress Additional Psychological Details: Pt is  concerned about government funding negatively impacting her housing and medical care Behavioral Management Strategies: Adequate rest, Coping strategies, Support system, Medication therapy Major Change/Loss/Stressor/Fears (CP): Medical condition, self Techniques to Cope with Loss/Stress/Change: Diversional activities      09/09/2024    PHQ2-9 Depression Screening   Little interest or pleasure in doing things    Feeling down, depressed, or hopeless    PHQ-2 - Total Score    Trouble falling or staying asleep, or sleeping too much    Feeling tired or having little energy    Poor appetite or overeating     Feeling bad about yourself - or that you are a failure or have let yourself or your family down    Trouble concentrating on things, such as reading the newspaper or watching television    Moving or speaking so slowly that other people could have noticed.  Or the opposite - being so fidgety or restless that you have been moving around a lot more than usual    Thoughts that you would be better off dead, or hurting yourself in some way    PHQ2-9 Total Score    If you checked off any problems, how difficult have these problems made it for you to do your work, take care of things at home, or get along with other people    Depression Interventions/Treatment      There were no vitals filed for this visit.  Medications Reviewed Today     Reviewed  by Ezzard Rolin BIRCH, LCSW (Social Worker) on 09/05/24 at 1408  Med List Status: <None>   Medication Order Taking? Sig Documenting Provider Last Dose Status Informant  albuterol  (PROVENTIL ) (2.5 MG/3ML) 0.083% nebulizer solution [id number]  Take 3 mLs (2.5 mg total) by nebulization every 6 (six) hours as needed for wheezing. Desai, Nikita S, MD  Active   albuterol  (VENTOLIN  HFA) 108 [id number] Base) MCG/ACT inhaler [id number]  Inhale 2 puffs into the lungs every 6 (six) hours as needed for wheezing. Desai, Nikita S, MD  Active   alclomethasone (ACLOVATE) 0.05 %  cream [id number]  Apply topically 2 (two) times daily as needed. [provider]  Active   alendronate (FOSAMAX) 70 MG tablet [id number]  Take 70 mg by mouth every Sunday. Take with a full glass of water on an empty stomach. [provider]  Active Self  ARIPiprazole (ABILIFY) 15 MG tablet [id number]  Take 15 mg by mouth daily. [provider]  Active Self  azithromycin  (ZITHROMAX  Z-PAK) 250 MG tablet [id number]  Per zpack instructions Hope Almarie ORN, NP  Active   B Complex Vitamins (VITAMIN B COMPLEX ) TABS [id number]  Take 1 tablet by mouth daily. Mannam, Praveen, MD  Active   benzonatate  (TESSALON ) 100 MG capsule [id number]  TAKE ONE CAPSULE BY MOUTH THREE TIMES DAILY AS NEEDED FOR COUGH  Patient not taking: Reported on 08/22/2024   Saguier, [place], PA-C  Active   benztropine (COGENTIN) 0.5 MG tablet [id number]  Take 0.5 mg by mouth 2 (two) times daily. [provider]  Active Self  Bepotastine  Besilate (BEPREVE) 1.5 % SOLN [id number]  Place 1 drop into both eyes 2 (two) times daily as needed. Saguier, Dallas, PA-C  Active   Bepotastine  Besilate 1.5 % SOLN [id number]  Apply to eye. [provider]  Active   cetirizine  (ZYRTEC ) 10 MG tablet [id number]  Take by mouth. [provider]  Active   clonazePAM (KLONOPIN) 0.5 MG tablet [id number]  Take by mouth. [provider]  Active   cromolyn (OPTICROM) 4 % ophthalmic solution [id number]  1 drop 4 (four) times daily. [provider]  Active   diclofenac  Sodium (VOLTAREN  ARTHRITIS PAIN) 1 % GEL [id number]  Apply 4 g topically 4 (four) times daily. Saguier, Dallas, PA-C  Active   dicyclomine  (BENTYL ) 20 MG tablet [id number]  Take 1 tablet (20 mg total) by mouth 3 (three) times daily as needed. Saguier, Dallas, PA-C  Active   Ensifentrine  (OHTUVAYRE ) 3 MG/2.5ML SUSP [id number]  Inhale 1 ampule into the lungs in the morning and at bedtime. Hope Almarie ORN, NP  Active   famciclovir  (FAMVIR ) 250 MG  tablet [id number]  Take 1 tablet (250 mg total) by mouth 2 (two) times daily. Saguier, Edward, PA-C  Active   Fluticasone-Umeclidin-Vilant (TRELEGY ELLIPTA ) 200-62.5-25 MCG/ACT AEPB [id number]  Inhale 1 puff into the lungs daily. Saguier, Dallas, PA-C  Active   gabapentin  (NEURONTIN ) 400 MG capsule [id number]  Take 3 capsules (1,200 mg total) by mouth 3 (three) times daily. Saguier, Dallas, PA-C  Active   hydrOXYzine  (ATARAX ) 10 MG tablet [id number]  Take 1-2 tablets (10-20 mg total) by mouth at bedtime as needed for anxiety (insomnia). Saguier, Dallas, PA-C  Active   ibuprofen  (ADVIL ) 200 MG tablet [id number]  1-4 tab po every 8 hours prn moderate pain Saguier, Edward, PA-C  Active   ibuprofen  (ADVIL ) 600 MG tablet [id number]  Take 1 tablet (600 mg total) by mouth 2 (two) times daily as needed for knee pain. Saguier, Dallas,  PA-C  Active   ipratropium-albuterol  (DUONEB) 0.5-2.5 (3) MG/3ML SOLN [id number]  Take 3 mLs by nebulization every 4 (four) hours as needed. Saguier, Dallas, PA-C  Active   levocetirizine (XYZAL ) 5 MG tablet [id number]  Take 1 tablet (5 mg total) by mouth every evening. Saguier, Dallas, PA-C  Active   levocetirizine (XYZAL ) 5 MG tablet [id number]  Take 1 tablet (5 mg total) by mouth every evening. Saguier, Dallas, PA-C  Active   loperamide  (IMODIUM ) 2 MG capsule [id number]  TAKE ONE CAPSULE BY MOUTH EVERY 6 TO 8 HOURS AS NEEDED FOR DIARRHEA Saguier, Dallas, PA-C  Active   losartan  (COZAAR ) 50 MG tablet [id number]  Take 1 tablet (50 mg total) by mouth daily. Saguier, Dallas, PA-C  Active   magnesium oxide (MAG-OX) 400 MG tablet [id number]  Take 250 mg by mouth daily. [provider]  Active Self  methimazole  (TAPAZOLE ) 5 MG tablet [id number]  TAKE ONE TABLET BY MOUTH DAILY Saguier, Dallas, PA-C  Active   montelukast  (SINGULAIR ) 10 MG tablet [id number]  Take 1 tablet (10 mg total) by mouth at bedtime. Saguier, Dallas, PA-C  Active   mupirocin  ointment (BACTROBAN ) 2 % [id number]  Apply 1  Application topically 2 (two) times daily. Saguier, Edward, PA-C  Active   NONFORMULARY OR COMPOUNDED ITEM [id number]  Elevated toilet seat Saguier, Edward, PA-C  Active   olopatadine  (PATANOL) 0.1 % ophthalmic solution [id number]  Place 1 drop into both eyes 2 (two) times daily. Saguier, Dallas, PA-C  Active   olopatadine  (PATANOL) 0.1 % ophthalmic solution [id number]  Place 1 drop into both eyes 2 (two) times daily. Saguier, Dallas, PA-C  Active   oxcarbazepine (TRILEPTAL) 600 MG tablet [id number]  Take 600 mg by mouth 2 (two) times daily. [provider]  Active Self  oxyCODONE  (ROXICODONE ) 5 MG immediate release tablet [id number]  Take 1 tablet (5 mg total) by mouth 3 (three) times daily as needed for moderate pain (pain score 4-6). Debby Fidela CROME, NP  Active   pantoprazole  (PROTONIX ) 40 MG tablet [id number]  Take 1 tablet (40 mg total) by mouth daily. Saguier, Dallas, PA-C  Active   potassium chloride  SA (KLOR-CON  M) 20 MEQ tablet [id number]  Take 1 tablet (20 mEq total) by mouth 2 (two) times daily for 7 days. Saguier, Dallas, PA-C  Expired 08/22/24 2359   predniSONE  (DELTASONE ) 10 MG tablet [id number]  Take 1 tablet (10 mg total) by mouth as needed. Mannam, Praveen, MD  Active   predniSONE  (DELTASONE ) 10 MG tablet [id number]  4 tabs for 2 days, then 3 tabs for 2 days, 2 tabs for 2 days, then 1 tab for 2 days, then stop Hope Almarie ORN, NP  Active   sodium chloride  HYPERTONIC 3 % nebulizer solution [id number]  3 ml via neb twice daily Mannam, Praveen, MD  Active   traZODone (DESYREL) 50 MG tablet [id number]  Take 50 mg by mouth at bedtime. [provider]  Active   triamcinolone  cream (KENALOG ) 0.1 % [id number]  Apply topically 2 (two) times daily as needed. Saguier, Dallas, PA-C  Active   triamcinolone  cream (KENALOG ) 0.1 % [id number]  Apply topically 2 (two) times daily.  Patient not taking: Reported on 08/22/2024   Saguier, Dallas, PA-C  Active   Vitamin D , Ergocalciferol , (DRISDOL )  1.25 MG (50000 UNIT) CAPS capsule [id number]  1 capsule per month Saguier, Dallas, PA-C  Active   Vitamin D , Ergocalciferol , (DRISDOL ) 1.25 MG (50000 UNIT) CAPS capsule [id number]  TAKE ONE CAPSULE BY MOUTH ONCE  WEEKLY FOR SIX WEEKS Saguier, Dallas RIGGERS  Active   Med List Note Arvie Leila MATSU 02/08/24 1350): Patient is asking for medication to be refilled today, patient is in pain.             Recommendation:   Continue Current Plan of Care  Follow Up Plan:   Telephone follow-up in 1 month  Rolin Kerns, LCSW [facility]  [facility], [facility] Clinical Social Worker Direct Dial: [phone]  Fax: [phone] Website: [email or web address] 5:55 AM

## 2024-09-10 NOTE — Addendum Note (Signed)
 Addended by: DORINA DALLAS HERO on: 09/10/2024 08:58 PM   Modules accepted: Orders

## 2024-09-12 ENCOUNTER — Telehealth: Payer: Self-pay | Admitting: *Deleted

## 2024-09-12 NOTE — Telephone Encounter (Signed)
 Patient called stating she needs a letter stating she is disabled and why she is disabled. She needs the letter for Medicaid and landlord. She advised 1 letter is enough as long as it is addressed to To whom it may concern. She also asked to make sure it stated what we are treating her for and that she had decreased lung capacity if possible. Advised the patient that I will ask her provider if he/she will write a letter and get back to her. If they will once it is complete someone will contact her. Patient would like the letter faxed to her Medical Power of attorney Ellouise Collier at [phone]. She verbalized this is a secure fax line and if we give her a call prior to faxing she will make sure she is there to receive the fax in real time.

## 2024-09-15 ENCOUNTER — Encounter: Payer: Self-pay | Admitting: *Deleted

## 2024-09-15 NOTE — Telephone Encounter (Signed)
 Letter drawn up and signed by Almarie Ferrari NP.    I called the patient to make her aware that I was faxing the letter to Ellouise Collier (Medical Power of Attorney).  She asked that I mail a copy to her as well.  I verified her mailing address as [address] [place] [address].  She was very Adult nurse.  Nothing further needed.  Letter faxed to [phone] as requested. Letter mailed to patient.

## 2024-09-15 NOTE — Telephone Encounter (Signed)
 Yes we can write a letter stating that she is considered disabled due to very severe COPD and chronic respiratory failure

## 2024-09-15 NOTE — Telephone Encounter (Signed)
 Beth, please see below message and advise if okay to write letter?

## 2024-09-16 ENCOUNTER — Ambulatory Visit (HOSPITAL_BASED_OUTPATIENT_CLINIC_OR_DEPARTMENT_OTHER)
Admission: RE | Admit: 2024-09-16 | Discharge: 2024-09-16 | Disposition: A | Discharge status: Home / Self Care | Source: Ambulatory Visit | Attending: Primary Care | Admitting: Primary Care

## 2024-09-16 ENCOUNTER — Ambulatory Visit (INDEPENDENT_AMBULATORY_CARE_PROVIDER_SITE_OTHER)

## 2024-09-16 ENCOUNTER — Ambulatory Visit (HOSPITAL_BASED_OUTPATIENT_CLINIC_OR_DEPARTMENT_OTHER)
Admission: RE | Admit: 2024-09-16 | Discharge: 2024-09-16 | Disposition: A | Discharge status: Home / Self Care | Source: Ambulatory Visit

## 2024-09-16 VITALS — BP 110/82 | Ht 63.5 in | Wt 167.0 lb

## 2024-09-16 DIAGNOSIS — M1711 Unilateral primary osteoarthritis, right knee: Secondary | ICD-10-CM | POA: Diagnosis not present

## 2024-09-16 DIAGNOSIS — J449 Chronic obstructive pulmonary disease, unspecified: Secondary | ICD-10-CM | POA: Diagnosis not present

## 2024-09-16 DIAGNOSIS — J9611 Chronic respiratory failure with hypoxia: Secondary | ICD-10-CM | POA: Diagnosis not present

## 2024-09-16 DIAGNOSIS — E041 Nontoxic single thyroid nodule: Secondary | ICD-10-CM | POA: Diagnosis not present

## 2024-09-16 DIAGNOSIS — G8929 Other chronic pain: Secondary | ICD-10-CM

## 2024-09-16 DIAGNOSIS — M25561 Pain in right knee: Secondary | ICD-10-CM

## 2024-09-16 DIAGNOSIS — R918 Other nonspecific abnormal finding of lung field: Secondary | ICD-10-CM | POA: Diagnosis not present

## 2024-09-16 DIAGNOSIS — R911 Solitary pulmonary nodule: Secondary | ICD-10-CM | POA: Insufficient documentation

## 2024-09-16 DIAGNOSIS — I2510 Atherosclerotic heart disease of native coronary artery without angina pectoris: Secondary | ICD-10-CM | POA: Diagnosis not present

## 2024-09-16 NOTE — Progress Notes (Signed)
 Subjective:    Patient ID: Jill Spence, female    DOB: 73 y.o., 1951/09/05   MRN: [id number]  Chief Complaint: R knee   Discussed the use of AI scribe software for clinical note transcription with the patient, who gave verbal consent to proceed.  History of Present Illness Jill Spence is a 73 year old female who presents with worsening knee pain and difficulty walking.  Knee pain and impaired mobility - Severe knee pain localized around the knee, progressively worsening over the past few months - Pain exacerbated by movement and exposure to cold air - Difficulty walking due to pain and instability - History of knee injury from a fall seven years ago with incomplete recovery - Wore a knee brace for over a year following the injury - Physical therapy performed five to six years ago, discontinued due to insurance issues - Exercise device use sometimes increases pain - Oral medications for pain management prescribed by primary care provider  Lower extremity instability and restless leg symptoms - Leg beginning to 'give away,' resulting in decreased stability - Restless leg syndrome present  Review of pertinent imaging: No imaging obtained knee to date in our system.   4 view plain radiographs obtained of the patient's right knee today per my independent review revealing mild lateral patellar tilt, decreased patellofemoral joint space.  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  Mild subchondral sclerosis noted most prominently along the medial and lateral tibial plateaus.  No significant osteophytic changes mild to moderate joint space narrowing in the medial lateral compartments fairly symmetrically.    Objective:   Vitals:   09/16/24 1313  BP: 110/82    Right knee (compared to normal) -Inspection: Minimal to mild swelling/effusion.  No erythema.  Mild deformity noted -Palpation: TTP + quad tendon, + patella, - patellar tendon, - tibial tuberosity, +  pes bursa, - gerdy tubercle, + medial joint line, + lateral joint line, + posterior knee, - medial and lateral hamstrings.  Prominent crepitus with flexion/extension. -AROM/PROM: 0 degrees extension, 105 degrees flexion, low hamstring flexibility -Strength: Unable to tolerate with single leg squat, 5/5 flexion, 5/5 extension -Special tests:    -ACL: - lachman,- anterior drawer   -MCL: 1+ and mildly painful valgus at 0/30 degrees   -LCL: 2+ and mildly painful varus at 0/30 degrees   -PCL: - sag sign   -Meniscus: Unable to tolerate thessaly, + McMurray   -Patellofemoral: + patellar grind      Assessment & Plan:   Assessment & Plan Right knee pain   Chronic right knee pain has worsened over the past few months, linked to a fall seven years ago with incomplete healing. Cold exposure increases pain, swelling, and instability. Previous physical therapy was protracted but was approximately 6 years ago and peculiarly seemed to acutely worsen her condition.  The patient expresses to me a stronger version to needles and injectable therapies as well as surgery.  Given this, we discussed topical anti-inflammatories, oral anti-inflammatories, physical therapy, shockwave therapy, Biofreeze, lidocaine  patches, bracing.  The patient expressed a strong preference for bracing and is okay with therapy as long as it is at her home.  We will place referral to home therapy and have fitted her for a brace today but cautioned her on overuse of this as it can lead to weakness.  Follow-up as needed.  Greater than 45 minutes was spent in face-to-face interaction with the patient discussing her x-ray results today, eliciting a patient history, discussing  various treatment options, and ultimately deciding on her preferred treatment regimen.

## 2024-09-17 ENCOUNTER — Other Ambulatory Visit: Payer: Self-pay | Admitting: *Deleted

## 2024-09-17 DIAGNOSIS — M1711 Unilateral primary osteoarthritis, right knee: Secondary | ICD-10-CM

## 2024-09-22 ENCOUNTER — Telehealth: Payer: Self-pay

## 2024-09-22 NOTE — Telephone Encounter (Signed)
 Looks like sports medicine put in a referral on the 15th   Copied from CRM #[id number]. Topic: Clinical - Home Health Verbal Orders >> Sep 22, 2024  1:54 PM Jill Spence wrote: Caller/Agency: Jill Spence / [facility] Callback Number: [phone] *Secured Geophysicist/field seismologist* Service Requested: Physical Therapy Frequency: 2 week 2 ; 1 week 3 for knee pain Any new concerns about the patient? No

## 2024-09-24 ENCOUNTER — Telehealth: Payer: Self-pay | Admitting: Registered Nurse

## 2024-09-24 ENCOUNTER — Other Ambulatory Visit: Payer: Self-pay | Admitting: Registered Nurse

## 2024-09-24 DIAGNOSIS — G894 Chronic pain syndrome: Secondary | ICD-10-CM

## 2024-09-24 DIAGNOSIS — M545 Low back pain, unspecified: Secondary | ICD-10-CM

## 2024-09-24 MED ORDER — OXYCODONE HCL 5 MG PO TABS: 5.0000 mg | ORAL_TABLET | Freq: Three times a day (TID) | ORAL | 0 refills | Status: DC | PRN

## 2024-09-24 NOTE — Telephone Encounter (Signed)
PMP was Reviewed.  Oxycodone e-scribed to pharmacy.

## 2024-09-25 DIAGNOSIS — H40243 Residual stage of angle-closure glaucoma, bilateral: Secondary | ICD-10-CM | POA: Diagnosis not present

## 2024-09-25 DIAGNOSIS — H47292 Other optic atrophy, left eye: Secondary | ICD-10-CM | POA: Diagnosis not present

## 2024-09-26 DIAGNOSIS — L03113 Cellulitis of right upper limb: Secondary | ICD-10-CM | POA: Diagnosis not present

## 2024-09-28 ENCOUNTER — Ambulatory Visit: Payer: Self-pay | Admitting: Primary Care

## 2024-09-29 ENCOUNTER — Telehealth: Payer: Self-pay

## 2024-09-29 ENCOUNTER — Ambulatory Visit: Payer: Self-pay

## 2024-09-29 NOTE — Telephone Encounter (Signed)
 Patient states that she wants Prednisone  and Antibiotics prescribed and she is given them to have on hand for emergencies Patient called this morning requesting same She states that she has no way to get to an appointment at this time and she always has these medications on hand She is requesting a call back about this  FYI Only or Action Required?: Action required by provider: clinical question for provider, update on patient condition, and patient requesting prednisone  and antibiotics.  Patient was last seen in primary care on 08/22/2024 by Dorina Loving, PA-C.  Called Nurse Triage reporting Cough.  Symptoms began 4 days ago.  Interventions attempted: OTC medications: cough syrup, mucinex, robitusssin dm and Rest, hydration, or home remedies.  Symptoms are: gradually worsening.  Triage Disposition: See Physician Within 24 Hours  Patient/caregiver understands and will follow disposition?: No, wishes to speak with PCP               Copied from CRM #[id number]. Topic: Clinical - Red Word Triage >> Sep 29, 2024  3:05 PM Rea BROCKS wrote: Red Word that prompted transfer to Nurse Triage: Patient is choking a lot. Asking for prednisone  20 mg  and antibiotics. Cough is productive and sometimes it won't come loose. Patient thinks she has an infection. Symptoms since Thursday or Friday and it got worse since yesterday. Patient took a prednisone  that she had left but is requesting antibiotics. Reason for Disposition  [1] Continuous (nonstop) coughing interferes with work or school AND [2] no improvement using cough treatment per Care Advice  Answer Assessment - Initial Assessment Questions Patient has been coughing/generally unwell for the past 4 days Patient states sometimes she coughs up mucous--yellow in color and thick She states she gets like this every year She also states that she ran out of Prednisone  and  Patient states that she wants Prednisone  and Antibiotics and she is given  them to have on hand for emergencies Patient called this morning requesting same This RN advised her that an office visit is usually required for antibiotics to be prescribed but she did not want to come in for an office visit. She states that she has no way to get to an appointment at this time and she always has these medications on hand She is requesting a call back about this  Patient is advised to call us  back if anything changes or with any further questions/concerns.     1. ONSET: When did the cough begin?      4 days ago 2. SEVERITY: How bad is the cough today?      ----- 3. SPUTUM: Describe the color of your sputum (e.g., none, dry cough; clear, white, yellow, green)     Yellow thick 4. HEMOPTYSIS: Are you coughing up any blood? If Yes, ask: How much? (e.g., flecks, streaks, tablespoons, etc.)     no 5. DIFFICULTY BREATHING: Are you having difficulty breathing? If Yes, ask: How bad is it? (e.g., mild, moderate, severe)      Patient states at first but now she's okay 6. FEVER: Do you have a fever? If Yes, ask: What is your temperature, how was it measured, and when did it start?     no 7. CARDIAC HISTORY: Do you have any history of heart disease? (e.g., heart attack, congestive heart failure)      ------ 8. LUNG HISTORY: Do you have any history of lung disease?  (e.g., pulmonary embolus, asthma, emphysema)     Emphysema, bronchitis  10. OTHER  SYMPTOMS: Do you have any other symptoms? (e.g., runny nose, wheezing, chest pain)       Runny nose  Protocols used: Cough - Acute Productive-A-AH

## 2024-09-29 NOTE — Telephone Encounter (Signed)
 Copied from CRM #[id number]. Topic: Clinical - Medication Question >> Sep 29, 2024  9:11 AM Burnard DEL wrote: Reason for CRM: Patient called in stating that she usually gets a antibiotic prescribed for her lungs that keeps her from getting an infection in her lungs.She did not know the name of the medication. She also would like to have 20 mg of prednisone  prescribed to her as well,and a cough syrup.   [id number] - [place], [place] - [address]  Phone: [phone] Fax: [phone]

## 2024-09-30 ENCOUNTER — Telehealth: Admitting: Medical

## 2024-09-30 VITALS — BP 131/86 | HR 67

## 2024-09-30 DIAGNOSIS — R062 Wheezing: Secondary | ICD-10-CM

## 2024-09-30 DIAGNOSIS — J40 Bronchitis, not specified as acute or chronic: Secondary | ICD-10-CM

## 2024-09-30 DIAGNOSIS — J449 Chronic obstructive pulmonary disease, unspecified: Secondary | ICD-10-CM | POA: Diagnosis not present

## 2024-09-30 MED ORDER — LEVOCETIRIZINE DIHYDROCHLORIDE 5 MG PO TABS: 5.0000 mg | ORAL_TABLET | Freq: Every evening | ORAL | 3 refills | Status: DC

## 2024-09-30 MED ORDER — METHYLPREDNISOLONE 4 MG PO TABS: ORAL_TABLET | ORAL | 0 refills | Status: DC

## 2024-09-30 MED ORDER — AMOXICILLIN-POT CLAVULANATE 875-125 MG PO TABS: 1.0000 | ORAL_TABLET | Freq: Two times a day (BID) | ORAL | 0 refills | Status: DC

## 2024-09-30 MED ORDER — AZITHROMYCIN 250 MG PO TABS: ORAL_TABLET | ORAL | 0 refills | Status: AC

## 2024-09-30 NOTE — Progress Notes (Signed)
 ATCx1, vm box was full so unable to LVM

## 2024-09-30 NOTE — Progress Notes (Signed)
 Virtual Visit via Video Note  I connected with Jill Spence on 09/30/24 at  3:20 PM EDT by a video enabled telemedicine application and verified that I am speaking with the correct person using two identifiers.  Location: Patient: home [place]  Provider: office [place]   I discussed the limitations of evaluation and management by telemedicine and the availability of in person appointments. The patient expressed understanding and agreed to proceed.  History of Present Illness: Discussed the use of AI scribe software for clinical note transcription with the patient, who gave verbal consent to proceed.  History of Present Illness          Jill Spence is a 73 year old female with stage three COPD who presents with chest congestion and wheezing.  She has been experiencing chest congestion and wheezing since last Friday, with of chest congestion,  breaking up mucus when cough and wheezingintermittently, similar to previous episodes. Cold and damp weather exacerbates her symptoms, particularly at night. She in the past had used prednisone  and antibiotics for bronchitis. Pt sees pulmonologist regularly.  Wheezing during sleep has been noted by her friend. She experiences intermittent sweats but denies recent fevers or chills. Her legs are not swollen compared to baseline, but she mentions having restless legs.  Recently, her right arm swelled up, leading her to seek urgent care, where the area was cut and some material drained out. She completed a course of antibiotics, but the arm remains sore and slightly swollen, with pus-like drainage.  She is currently experiencing thick mucus that is difficult to expel, causing choking sensations and nasal congestion. She is not allergic to penicillin but is allergic to the flu vaccine and aspirin. She also mentions using Robitussin and Mucinex for symptom relief, but the specific brand she preferred is no longer available. She has been using Xyzal  for allergies,  which she finds effective for her  allergy symptoms.     Observations/Objective: General-no acute distress, pleasant, oriented. Lungs- on inspection lungs appear unlabored. Neck- no tracheal deviation or jvd on inspection. Neuro- gross motor function appears intact.  Assessment and Plan: Assessment and Plan          COPD with emphysema, acute exacerbation with bronchitis and wheezing Acute exacerbation of COPD with moderate to severe emphysema, presenting with chest congestion, wheezing, and mucus production. Previous treatment with prednisone  and antibiotics was effective but has run out. - Prescribe azithromycin  for bronchitis and potential pneumonia coverage. - Prescribe Medrol  4 mg six-day taper dose pack. - Advise use of plain Robitussin or Mucinex for symptomatic relief. - Schedule follow-up in seven days if symptoms do not improve.  Right elbow skin abscess, post-incision and drainage Right elbow abscess post-incision and drainage with persistent swelling and soreness. Yellowish pus indicates incomplete drainage. Previously prescribed antibiotics were ineffective. - Prescribe Augmentin for better coverage of skin infection. - Advise warm salt water compresses to aid drainage. - Schedule follow-up in seven days if the abscess does not resolve or if there is increased swelling, tenderness, or drainage.  Allergic conjunctivitis and seasonal allergies Burning and itching of the eyes with difficulty opening them in the morning. Seasonal allergies present, with previous effective use of Xyzal  antihistamine. - Prescribe Xyzal  antihistamine for allergic conjunctivitis and seasonal allergies. - Consider Patanol eye drops if symptoms persist.  Follow up one week or sooner if needed    Follow Up Instructions:    I discussed the assessment and treatment plan with the patient. The patient was  provided an opportunity to ask questions and all were answered. The patient agreed with the  plan and demonstrated an understanding of the instructions.   The patient was advised to call back or seek an in-person evaluation if the symptoms worsen or if the condition fails to improve as anticipated.   Amanat Hackel, PA-C

## 2024-09-30 NOTE — Telephone Encounter (Signed)
 Appt scheduled    Copied from CRM #[id number]. Topic: General - Call Back - No Documentation >> Sep 30, 2024  9:04 AM Harlene ORN wrote: Reason for CRM: Patient is returning call from the Practice.

## 2024-09-30 NOTE — Telephone Encounter (Signed)
 Pt called in and refused an in person appointment, she said she had no way of getting to an appointment. She would only agree to a virtual appointment to talk to the dr

## 2024-09-30 NOTE — Telephone Encounter (Signed)
 Pt called and lvm to return call

## 2024-09-30 NOTE — Patient Instructions (Signed)
 COPD with emphysema, acute exacerbation with bronchitis and wheezing Acute exacerbation of COPD with moderate to severe emphysema, presenting with chest congestion, wheezing, and mucus production. Previous treatment with prednisone  and antibiotics was effective but has run out. - Prescribe azithromycin  for bronchitis and potential pneumonia coverage. - Prescribe Medrol  4 mg six-day taper dose pack. - Advise use of plain Robitussin or Mucinex for symptomatic relief. - Schedule follow-up in seven days if symptoms do not improve.  Right elbow skin abscess, post-incision and drainage Right elbow abscess post-incision and drainage with persistent swelling and soreness. Yellowish pus indicates incomplete drainage. Previously prescribed antibiotics were ineffective. - Prescribe Augmentin for better coverage of skin infection. - Advise warm salt water compresses to aid drainage. - Schedule follow-up in seven days if the abscess does not resolve or if there is increased swelling, tenderness, or drainage.  Allergic conjunctivitis and seasonal allergies Burning and itching of the eyes with difficulty opening them in the morning. Seasonal allergies present, with previous effective use of Xyzal  antihistamine. - Prescribe Xyzal  antihistamine for allergic conjunctivitis and seasonal allergies. - Consider Patanol eye drops if symptoms persist.  Follow up one week or sooner if needed

## 2024-10-01 ENCOUNTER — Telehealth: Payer: Self-pay

## 2024-10-01 ENCOUNTER — Telehealth (INDEPENDENT_AMBULATORY_CARE_PROVIDER_SITE_OTHER): Admitting: Primary Care

## 2024-10-01 ENCOUNTER — Encounter: Payer: Self-pay | Admitting: Primary Care

## 2024-10-01 VITALS — Ht 63.5 in | Wt 164.0 lb

## 2024-10-01 DIAGNOSIS — J449 Chronic obstructive pulmonary disease, unspecified: Secondary | ICD-10-CM

## 2024-10-01 DIAGNOSIS — J9611 Chronic respiratory failure with hypoxia: Secondary | ICD-10-CM

## 2024-10-01 DIAGNOSIS — E042 Nontoxic multinodular goiter: Secondary | ICD-10-CM

## 2024-10-01 MED ORDER — PREDNISONE 10 MG PO TABS: 10.0000 mg | ORAL_TABLET | Freq: Every day | ORAL | 0 refills | Status: DC

## 2024-10-01 NOTE — Telephone Encounter (Signed)
 Pt was scheduled virtual appt today with BW.

## 2024-10-01 NOTE — Progress Notes (Signed)
 Called and spoke to pt, advised of CT results per Landry Ferrari, NP. Pt stopped me while I was reading her results and began adamantly complaining about how long it took to get her results. I advised that it had only been two weeks and CT scans can take weeks to come back. Pt was still upset.  Pt has a virtual visit with Landry Ferrari today, 10/29, at 3:30. Per Landry Ferrari, her concerns will be addressed then. NFN

## 2024-10-01 NOTE — Telephone Encounter (Signed)
 Copied from CRM [id number]. Topic: Clinical - Prescription Issue >> Sep 30, 2024  4:47 PM Jill Spence wrote: Reason for CRM: Pt is requesting a refill of predniSONE  (DELTASONE ) 10 MG tablet sent to preferred pharmacy [id number] - [place], [place] - [address], but the prescription I see active only shows 10mg  and she's request 20 mg tablets.   Pt's phone number is [phone] vm box full.

## 2024-10-01 NOTE — Telephone Encounter (Signed)
 Please schedule visit with me today at 3:30pm, can be virtual

## 2024-10-01 NOTE — Progress Notes (Signed)
 Virtual Visit via Video Note  I connected with Jill Spence on 10/05/24 at  3:30 PM EDT by a video enabled telemedicine application and verified that I am speaking with the correct person using two identifiers.  Location: Patient: Home Provider: Office   I discussed the limitations of evaluation and management by telemedicine and the availability of in person appointments. The patient expressed understanding and agreed to proceed.  History of Present Illness:  73 year old former, former smoker. PMH significant for COPD stage 3, chronic respiratory failure, hyperthyroidism, multiple thyroid  nodules, vitamin D  deficiency.   Previous [facility] pulmonary encounters: 10/01/2024 Discussed the use of AI scribe software for clinical note transcription with the patient, who gave verbal consent to proceed.  History of Present Illness Jill Spence is a 73 year old female with severe COPD and emphysema who presents with worsening respiratory symptoms. Complains of chest congestion and wheezing since last Friday   She has a history of severe COPD with emphysema, with lung function tests showing 32% capacity. She experiences worsening fatigue and weakness, impacting her ability to perform daily activities. She uses a Trelegy inhaler daily. Intermittent use of nebulizer and oxygen  therapy.  Ohtuvayre  nebulizer twice daily, morning and evening.   She has a chronic cough with thick mucus production. Recently, she was prescribed a Z-Pak and prednisone  by her primary care physician, but has not yet started the prednisone . She has previously been maintained on 20 mg of prednisone  as needed during cold weather.  A high-resolution CT scan on September 16, 2024, showed severe emphysema and multiple noncalcified pulmonary nodules measuring up to 7 mm. She is a former smoker, which may contribute to her pulmonary condition.  She has been experiencing gastrointestinal issues, including diarrhea, which she associates  with taking azithromycin . She completed a course of Augmentin for a right elbow abscess and has difficulty managing her medications due to difficulty reading instructions.  She has nodules on her thyroid  and lungs, which have been described as waxing and waning.   Observations/Objective:  Appears well without overt respiratory symptoms   Assessment and Plan:  1. Stage 3 severe COPD by GOLD classification (HCC) (Primary)  2. Chronic respiratory failure with hypoxia (HCC)  3. Multiple thyroid  nodules  Assessment & Plan Severe COPD with emphysema and chronic respiratory failure Severe COPD with emphysema and chronic respiratory failure, with lung function at 32%. Symptoms include chronic cough, thick mucus, fatigue, and weakness. Symptoms are not well controlled on current triple therapy. Discussed the use of daily low-dose prednisone  (10 mg) due to severe symptoms and limited activity. Risks of long-term prednisone  include drug-induced diabetes, hyperglycemia, and immunosuppression. Benefits of improved respiratory function outweigh risks. She prefers prednisone  over azithromycin  due to side effects of diarrhea with azithromycin . - Prescribe prednisone  10 mg daily - Instruct to use Ohtuvayre  nebulizer twice daily - Refer to [facility] for follow-up and management  Stable pulmonary nodules Multiple small pulmonary nodules, stable at 7 mm, with waxing and waning size. Likely due to inflammation or infection. Nodules require close monitoring as they can potentially turn into cancer when they grow and shrink. She is a former smoker, which can contribute to inflammation. - Monitor nodules for changes in size or characteristics  Resolving right elbow abscess Right elbow abscess treated with Augmentin. She has completed the antibiotic course. - No further intervention required at this time  Follow Up Instructions:   1 month with Dr. Theophilus or sooner if needed  I discussed  the assessment  and treatment plan with the patient. The patient was provided an opportunity to ask questions and all were answered. The patient agreed with the plan and demonstrated an understanding of the instructions.   The patient was advised to call back or seek an in-person evaluation if the symptoms worsen or if the condition fails to improve as anticipated.  I provided 25 minutes of non-face-to-face time during this encounter.   Almarie LELON Ferrari, NP   Almarie LELON Ferrari, NP 10/01/2024

## 2024-10-02 ENCOUNTER — Other Ambulatory Visit: Payer: Self-pay | Admitting: Medical

## 2024-10-02 DIAGNOSIS — J449 Chronic obstructive pulmonary disease, unspecified: Secondary | ICD-10-CM | POA: Diagnosis not present

## 2024-10-03 ENCOUNTER — Other Ambulatory Visit (HOSPITAL_COMMUNITY)

## 2024-10-03 MED ORDER — PREDNISONE 10 MG PO TABS: 10.0000 mg | ORAL_TABLET | Freq: Every day | ORAL | 0 refills | Status: DC

## 2024-10-07 ENCOUNTER — Telehealth: Payer: Self-pay

## 2024-10-07 ENCOUNTER — Other Ambulatory Visit: Payer: Self-pay | Admitting: Medical

## 2024-10-07 ENCOUNTER — Other Ambulatory Visit: Payer: Self-pay

## 2024-10-07 MED ORDER — IBUPROFEN 600 MG PO TABS: 600.0000 mg | ORAL_TABLET | Freq: Three times a day (TID) | ORAL | 0 refills | Status: DC

## 2024-10-07 NOTE — Telephone Encounter (Signed)
 Called pt to confirm if she needed the 200 or 600 advil  she confirmed the 600 and what pharmacy she wanted it to be sent to.    Copied from CRM [id number]. Topic: Clinical - Medication Question >> Oct 07, 2024 11:22 AM Jill Spence wrote: Reason for CRM: Patient would like to have someone call once script has been sent

## 2024-10-07 NOTE — Telephone Encounter (Signed)
 Copied from CRM [id number]. Topic: Clinical - Medication Refill >> Oct 07, 2024 11:21 AM Deaijah H wrote: Medication: buprofen (ADVIL ) 600 MG tablet  Has the patient contacted their pharmacy? No (Agent: If no, request that the patient contact the pharmacy for the refill. If patient does not wish to contact the pharmacy document the reason why and proceed with request.) (Agent: If yes, when and what did the pharmacy advise?) Statesz no refills  This is the patient's preferred pharmacy:  [id number] - [place], [place] - [address] [place] [address] Phone: [phone] Fax: [phone]    Is this the correct pharmacy for this prescription? Yes If no, delete pharmacy and type the correct one.   Has the prescription been filled recently? Yes  Is the patient out of the medication? Yes  Has the patient been seen for an appointment in the last year OR does the patient have an upcoming appointment? Yes  Can we respond through MyChart? Yes  Agent: Please be advised that Rx refills may take up to 3 business days. We ask that you follow-up with your pharmacy.

## 2024-10-08 ENCOUNTER — Other Ambulatory Visit: Payer: Self-pay | Admitting: Medical

## 2024-10-10 ENCOUNTER — Other Ambulatory Visit: Payer: Self-pay | Admitting: Licensed Clinical Social Worker

## 2024-10-10 ENCOUNTER — Other Ambulatory Visit: Payer: Self-pay | Admitting: Medical

## 2024-10-10 NOTE — Patient Outreach (Signed)
 Complex Care Management   Visit Note  10/10/2024  Name:  Jill Spence MRN: [id number] DOB: November 22, 1951  Situation: Referral received for Complex Care Management related to Mental/Behavioral Health diagnosis Anxiety I obtained verbal consent from Patient.  Visit completed with Patient  on the phone  Background:   Past Medical History:  Diagnosis Date   Anemia    low iron   Anxiety    Arthritis    Bipolar disorder (HCC)    Cancer (HCC) 1973   COPD (chronic obstructive pulmonary disease) (HCC)    Family history of adverse reaction to anesthesia    mom was put to sleep and she never woke up -    GERD (gastroesophageal reflux disease)    Guillain Barr syndrome    numbness in toes, legs hurt   Headache    migraines as a teenager   IBS (irritable bowel syndrome)    Lump, breast    removed years ago per Pt.   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, has quiet' seizures   Thyroid  disease     Assessment: Patient Reported Symptoms:  Cognitive Cognitive Status: No symptoms reported, Alert and oriented to person, place, and time, Normal speech and language skills Cognitive/Intellectual Conditions Management [RPT]: None reported or documented in medical history or problem list   Health Maintenance Behaviors: Annual physical exam  Neurological Neurological Review of Symptoms: No symptoms reported    HEENT HEENT Symptoms Reported: No symptoms reported      Cardiovascular Cardiovascular Symptoms Reported: No symptoms reported    Respiratory Respiratory Symptoms Reported: Shortness of breath, Productive cough Respiratory Management Strategies: Coping strategies, Oxygen  therapy  Endocrine Endocrine Symptoms Reported: No symptoms reported Is patient diabetic?: No    Gastrointestinal Gastrointestinal Symptoms Reported: No symptoms reported      Genitourinary Genitourinary Symptoms Reported: No symptoms  reported    Integumentary Integumentary Symptoms Reported: No symptoms reported    Musculoskeletal Musculoskelatal Symptoms Reviewed: Weakness Musculoskeletal Comment: Patient fell recently and hurt knees. Requesting voltaren  gel refill Falls in the past year?: Yes Number of falls in past year: 2 or more Was there an injury with Fall?: No Fall Risk Category Calculator: 2 Patient Fall Risk Level: Moderate Fall Risk Patient at Risk for Falls Due to: Impaired balance/gait Fall risk Follow up: Falls prevention discussed  Psychosocial            10/10/2024    PHQ2-9 Depression Screening   Little interest or pleasure in doing things    Feeling down, depressed, or hopeless    PHQ-2 - Total Score    Trouble falling or staying asleep, or sleeping too much    Feeling tired or having little energy    Poor appetite or overeating     Feeling bad about yourself - or that you are a failure or have let yourself or your family down    Trouble concentrating on things, such as reading the newspaper or watching television    Moving or speaking so slowly that other people could have noticed.  Or the opposite - being so fidgety or restless that you have been moving around a lot more than usual    Thoughts that you would be better off dead, or hurting yourself in some way    PHQ2-9 Total Score    If you checked off any problems, how difficult have these problems  made it for you to do your work, take care of things at home, or get along with other people    Depression Interventions/Treatment      There were no vitals filed for this visit.  Medications Reviewed Today     Reviewed by Tunis Gentle D, LCSW (Social Worker) on 10/10/24 at 1515  Med List Status: <None>   Medication Order Taking? Sig Documenting Provider Last Dose Status Informant  albuterol  (PROVENTIL ) (2.5 MG/3ML) 0.083% nebulizer solution [id number]  Take 3 mLs (2.5 mg total) by nebulization every 6 (six) hours as needed for wheezing.  Desai, Nikita S, MD  Active   albuterol  (VENTOLIN  HFA) 108 [phone] Base) MCG/ACT inhaler [id number]  Inhale 2 puffs into the lungs every 6 (six) hours as needed for wheezing. Desai, Nikita S, MD  Active   alclomethasone (ACLOVATE) 0.05 % cream [id number]  Apply topically 2 (two) times daily as needed. [provider]  Active   alendronate (FOSAMAX) 70 MG tablet [id number]  Take 70 mg by mouth every Sunday. Take with a full glass of water on an empty stomach. [provider]  Active Self  amoxicillin-clavulanate (AUGMENTIN) 875-125 MG tablet [id number]  Take 1 tablet by mouth 2 (two) times daily. Saguier, [place], PA-C  Active   ARIPiprazole (ABILIFY) 15 MG tablet [id number]  Take 15 mg by mouth daily. [provider]  Active Self  azithromycin  (ZITHROMAX  Z-PAK) 250 MG tablet [id number]  Per zpack instructions  Patient not taking: Reported on 10/01/2024   Hope Almarie ORN, NP  Active   B Complex Vitamins (VITAMIN B COMPLEX ) TABS [id number]  Take 1 tablet by mouth daily. Mannam, Praveen, MD  Active   benzonatate  (TESSALON ) 100 MG capsule [id number]  TAKE ONE CAPSULE BY MOUTH THREE TIMES DAILY AS NEEDED FOR COUGH Saguier, [place], PA-C  Active   benztropine (COGENTIN) 0.5 MG tablet [id number]  Take 0.5 mg by mouth 2 (two) times daily. [provider]  Active Self  Bepotastine  Besilate (BEPREVE) 1.5 % SOLN [id number]  Place 1 drop into both eyes 2 (two) times daily as needed. Saguier, Dallas, PA-C  Active   Bepotastine  Besilate 1.5 % SOLN [id number]  Apply to eye. [provider]  Active   cetirizine  (ZYRTEC ) 10 MG tablet [id number]  Take by mouth. [provider]  Active   clonazePAM (KLONOPIN) 0.5 MG tablet [id number]  Take by mouth. [provider]  Active   cromolyn (OPTICROM) 4 % ophthalmic solution [id number]  1 drop 4 (four) times daily. [provider]  Active   diclofenac  Sodium (VOLTAREN  ARTHRITIS PAIN) 1 % GEL [id number]  Apply 4 g topically 4  (four) times daily. Saguier, Dallas, PA-C  Active   dicyclomine  (BENTYL ) 20 MG tablet [id number]  Take 1 tablet (20 mg total) by mouth 3 (three) times daily as needed. Saguier, Dallas, PA-C  Active   Ensifentrine  (OHTUVAYRE ) 3 MG/2.5ML SUSP [id number]  Inhale 1 ampule into the lungs in the morning and at bedtime. Hope Almarie ORN, NP  Active   famciclovir  (FAMVIR ) 250 MG tablet [id number]  Take 1 tablet (250 mg total) by mouth 2 (two) times daily. Saguier, Edward, PA-C  Active   Fluticasone-Umeclidin-Vilant (TRELEGY ELLIPTA ) 200-62.5-25 MCG/ACT AEPB [id number]  Inhale 1 puff into the lungs daily. Saguier, Dallas, PA-C  Active   gabapentin  (NEURONTIN ) 400 MG capsule [id number]  Take 3 capsules (1,200 mg total) by mouth 3 (three) times daily. Saguier, Dallas, PA-C  Active   hydrOXYzine  (ATARAX ) 10 MG tablet [id number]  Take  1-2 tablets (10-20 mg total) by mouth at bedtime as needed for anxiety (insomnia). Saguier, Dallas, PA-C  Active   ibuprofen  (ADVIL ) 200 MG tablet [id number]  1-4 tab po every 8 hours prn moderate pain Saguier, Edward, PA-C  Active   ibuprofen  (ADVIL ) 600 MG tablet [id number]  Take 1 tablet (600 mg total) by mouth 3 (three) times daily. Saguier, Dallas, PA-C  Active   ipratropium-albuterol  (DUONEB) 0.5-2.5 (3) MG/3ML SOLN [id number]  Take 3 mLs by nebulization every 4 (four) hours as needed. Saguier, Dallas, PA-C  Active   levocetirizine (XYZAL ) 5 MG tablet [id number]  Take 1 tablet (5 mg total) by mouth every evening. Saguier, Dallas, PA-C  Active   levocetirizine (XYZAL ) 5 MG tablet [id number]  Take 1 tablet (5 mg total) by mouth every evening.  Patient not taking: Reported on 10/01/2024   SaguierDallas, PA-C  Active   levocetirizine (XYZAL ) 5 MG tablet [id number]  Take 1 tablet (5 mg total) by mouth every evening.  Patient not taking: Reported on 10/01/2024   SaguierDallas, PA-C  Active   loperamide  (IMODIUM ) 2 MG capsule [id number]  TAKE ONE CAPSULE BY MOUTH EVERY 6 TO 8 HOURS AS  NEEDED FOR DIARRHEA. Saguier, Dallas, PA-C  Active   losartan  (COZAAR ) 50 MG tablet [id number]  Take 1 tablet (50 mg total) by mouth daily. Saguier, Dallas, PA-C  Active   magnesium oxide (MAG-OX) 400 MG tablet [id number]  Take 250 mg by mouth daily. [provider]  Active Self  methimazole  (TAPAZOLE ) 5 MG tablet [id number]  TAKE ONE TABLET BY MOUTH DAILY Saguier, Dallas, PA-C  Active   methylPREDNISolone  (MEDROL ) 4 MG tablet [id number]  Standard 6 day taper dose pack Saguier, Dallas, PA-C  Active   montelukast  (SINGULAIR ) 10 MG tablet [id number]  Take 1 tablet (10 mg total) by mouth at bedtime. Saguier, Dallas, PA-C  Active   mupirocin  ointment (BACTROBAN ) 2 % [id number]  Apply 1 Application topically 2 (two) times daily. Saguier, Edward, PA-C  Active   NONFORMULARY OR COMPOUNDED ITEM [id number]  Elevated toilet seat Saguier, Edward, PA-C  Active   olopatadine  (PATANOL) 0.1 % ophthalmic solution [id number]  Place 1 drop into both eyes 2 (two) times daily. Saguier, Dallas, PA-C  Active   olopatadine  (PATANOL) 0.1 % ophthalmic solution [id number]  Place 1 drop into both eyes 2 (two) times daily. Saguier, Dallas, PA-C  Active   oxcarbazepine (TRILEPTAL) 600 MG tablet [id number]  Take 600 mg by mouth 2 (two) times daily. [provider]  Active Self  oxyCODONE  (ROXICODONE ) 5 MG immediate release tablet [id number]  Take 1 tablet (5 mg total) by mouth 3 (three) times daily as needed for moderate pain (pain score 4-6). Debby Fidela CROME, NP  Active   pantoprazole  (PROTONIX ) 40 MG tablet [id number]  Take 1 tablet (40 mg total) by mouth daily. Saguier, Dallas, PA-C  Active   potassium chloride  SA (KLOR-CON  M) 20 MEQ tablet [id number]  Take 1 tablet (20 mEq total) by mouth 2 (two) times daily for 7 days. Saguier, Edward, PA-C  Expired 10/01/24 2359   predniSONE  (DELTASONE ) 10 MG tablet [id number]  Take 1 tablet (10 mg total) by mouth daily with breakfast. Hope Almarie ORN, NP  Active   sodium chloride   HYPERTONIC 3 % nebulizer solution [id number]  3 ml via neb twice daily Mannam, Praveen, MD  Active   traZODone (DESYREL) 50 MG tablet [id number]  Take 50 mg by mouth at bedtime. [provider]  Active   triamcinolone  cream (  KENALOG ) 0.1 % [id number]  Apply topically 2 (two) times daily as needed.  Patient not taking: Reported on 10/01/2024   SaguierDallas, PA-C  Active   triamcinolone  cream (KENALOG ) 0.1 % [id number]  Apply topically 2 (two) times daily.  Patient not taking: Reported on 10/01/2024   Saguier, Dallas, PA-C  Active   Vitamin D , Ergocalciferol , (DRISDOL ) 1.25 MG (50000 UNIT) CAPS capsule [id number]  1 capsule per month  Patient not taking: Reported on 10/01/2024   SaguierD[place], PA-C  Active   Vitamin D , Ergocalciferol , (DRISDOL ) 1.25 MG (50000 UNIT) CAPS capsule [id number]  TAKE ONE CAPSULE BY MOUTH ONCE WEEKLY FOR SIX WEEKS Saguier, Dallas RIGGERS  Active   Med List Note Arvie Leila MATSU 02/08/24 1350): Patient is asking for medication to be refilled today, patient is in pain.             Recommendation:   Continue Current Plan of Care  Follow Up Plan:   Telephone follow-up in 1 month  Rolin Kerns, LCSW [facility]  [facility], [facility] Clinical Social Worker Direct Dial: [phone]  Fax: [phone] Website: [email or web address] 3:41 PM

## 2024-10-10 NOTE — Telephone Encounter (Unsigned)
 Copied from CRM [id number]. Topic: Clinical - Medication Refill >> Oct 10, 2024  3:48 PM Alexandria E wrote: Medication: diclofenac  Sodium (VOLTAREN  ARTHRITIS PAIN) 1 % GEL  Has the patient contacted their pharmacy? No, Jasmine (child psychotherapist) called in to get this refilled for the patient.  (Agent: If no, request that the patient contact the pharmacy for the refill. If patient does not wish to contact the pharmacy document the reason why and proceed with request.) (Agent: If yes, when and what did the pharmacy advise?)  This is the patient's preferred pharmacy:  [id number] - [place], [place] - [address] [place] [address] Phone: [phone] Fax: [phone]   Is this the correct pharmacy for this prescription? Yes If no, delete pharmacy and type the correct one.   Has the prescription been filled recently? No  Is the patient out of the medication? Yes  Has the patient been seen for an appointment in the last year OR does the patient have an upcoming appointment? Yes  Can we respond through MyChart? Yes  Agent: Please be advised that Rx refills may take up to 3 business days. We ask that you follow-up with your pharmacy.

## 2024-10-10 NOTE — Patient Instructions (Signed)
 Visit Information  Thank you for taking time to visit with me today. Please don't hesitate to contact me if I can be of assistance to you before our next scheduled appointment.  Your next care management appointment is by telephone on 12/12 at 2 PM   Please call the care guide team at [phone] if you need to cancel, schedule, or reschedule an appointment.   Please call the Suicide and Crisis Lifeline: 988 go to [facility] [address], [place] [phone]) call 911 if you are experiencing a Mental Health or Behavioral Health Crisis or need someone to talk to.  Rolin Kerns, LCSW [place]  [facility], [facility] Clinical Social Worker Direct Dial: [phone]  Fax: [phone] Website: [email or web address] 3:41 PM

## 2024-10-11 MED ORDER — DICLOFENAC SODIUM 1 % EX GEL: 4.0000 g | Freq: Four times a day (QID) | CUTANEOUS | 3 refills | Status: AC

## 2024-10-11 NOTE — Telephone Encounter (Signed)
 Rx voltaren  gel sent to pharmacy. Advise pt topical gel preferred for her to use over oral ibuprofen /nsaid.

## 2024-10-12 MED ORDER — DICLOFENAC SODIUM 1 % EX GEL: 4.0000 g | Freq: Four times a day (QID) | CUTANEOUS | 0 refills | Status: AC

## 2024-10-12 NOTE — Addendum Note (Signed)
 Addended by: DORINA DALLAS HERO on: 10/12/2024 05:37 PM   Modules accepted: Orders

## 2024-10-13 ENCOUNTER — Telehealth: Payer: Self-pay

## 2024-10-13 ENCOUNTER — Other Ambulatory Visit: Payer: Self-pay

## 2024-10-13 NOTE — Telephone Encounter (Signed)
 Called pt she says she has never had to pay for this rx  I called [facility] and had it canceled they said its a charge due to it being an otc  medication. Is there a different brand that would be no charge that could be  Sent it   Copied from CRM [id number]. Topic: Clinical - Prescription Issue >> Oct 13, 2024 10:15 AM Jill Spence wrote: Reason for CRM: Pt tried to pick up diclofenac  Sodium (VOLTAREN  ARTHRITIS PAIN) 1 % GEL at the pharmacy, but it was $35. She said she has never had to pay that before. When she got it from [facility] it was $0. She is requesting that if it was changed to another brand, to send the refill of the $0 brand, or for it to be transferred to [employer]. Please call pt at [phone] once complete. She says if it is [facility], the one at [facility] needs to be cancelled or they will not fill it.

## 2024-10-13 NOTE — Telephone Encounter (Signed)
 Called pt and notified her via vm that the pharmacy will no longer cover the Voltaren  gel due to it being otc now

## 2024-10-19 ENCOUNTER — Ambulatory Visit: Payer: Self-pay

## 2024-10-21 ENCOUNTER — Other Ambulatory Visit: Payer: Self-pay | Admitting: Medical

## 2024-10-22 ENCOUNTER — Other Ambulatory Visit: Payer: Self-pay | Admitting: Medical

## 2024-10-23 ENCOUNTER — Encounter: Attending: Physical Medicine and Rehabilitation | Admitting: Registered Nurse

## 2024-10-23 ENCOUNTER — Other Ambulatory Visit: Payer: Self-pay | Admitting: Registered Nurse

## 2024-10-23 VITALS — BP 120/76 | HR 62

## 2024-10-23 DIAGNOSIS — G610 Guillain-Barre syndrome: Secondary | ICD-10-CM | POA: Diagnosis present

## 2024-10-23 DIAGNOSIS — Z5181 Encounter for therapeutic drug level monitoring: Secondary | ICD-10-CM | POA: Insufficient documentation

## 2024-10-23 DIAGNOSIS — M545 Low back pain, unspecified: Secondary | ICD-10-CM | POA: Diagnosis present

## 2024-10-23 DIAGNOSIS — M25562 Pain in left knee: Secondary | ICD-10-CM | POA: Diagnosis present

## 2024-10-23 DIAGNOSIS — G894 Chronic pain syndrome: Secondary | ICD-10-CM | POA: Diagnosis present

## 2024-10-23 DIAGNOSIS — G8929 Other chronic pain: Secondary | ICD-10-CM | POA: Insufficient documentation

## 2024-10-23 DIAGNOSIS — M25561 Pain in right knee: Secondary | ICD-10-CM | POA: Diagnosis present

## 2024-10-23 DIAGNOSIS — Z79891 Long term (current) use of opiate analgesic: Secondary | ICD-10-CM | POA: Diagnosis present

## 2024-10-23 MED ORDER — OXYCODONE HCL 5 MG PO TABS: 5.0000 mg | ORAL_TABLET | Freq: Three times a day (TID) | ORAL | 0 refills | Status: DC | PRN

## 2024-10-23 NOTE — Progress Notes (Signed)
 Subjective:    Patient ID: Jill Spence, female    DOB: 1951-06-22, 73 y.o.   MRN: [id number]  HPI: Jill Spence is a 73 y.o. female who returns for follow up appointment for chronic pain and medication refill. She states her  pain is located in her bilateral knees and generalized pain. She rates her pain 7. Her current exercise regime is walking and attending Pulmonary Therapy two days a week.   Ms. Nolte Morphine equivalent is 22.50 MME.   Last Oral Swab was Performed on 08/14/2024, it was consistent.     Pain Inventory Average Pain 7 Pain Right Now 7 My pain is aching and throbbing  In the last 24 hours, has pain interfered with the following? General activity 0 Relation with others 3 Enjoyment of life 7 What TIME of day is your pain at its worst? night Sleep (in general) Fair  Pain is worse with: cold weather Pain improves with: medication Relief from Meds: 5  Family History  Problem Relation Age of Onset   Cancer Father    Emphysema Sister    Social History   Socioeconomic History   Marital status: Widowed    Spouse name: Not on file   Number of children: Not on file   Years of education: Not on file   Highest education level: Some college, no degree  Occupational History   Not on file  Tobacco Use   Smoking status: Former    Current packs/day: 0.00    Average packs/day: 0.3 packs/day for 6.0 years (1.5 ttl pk-yrs)    Types: Cigarettes    Start date: 10/13/2001    Quit date: 10/14/2007    Years since quitting: 17.0   Smokeless tobacco: Never  Vaping Use   Vaping status: Never Used  Substance and Sexual Activity   Alcohol use: No   Drug use: No   Sexual activity: Not on file  Other Topics Concern   Not on file  Social History Narrative   Not on file   Social Drivers of Health   Financial Resource Strain: Low Risk  (06/19/2024)   Overall Financial Resource Strain (CARDIA)    Difficulty of Paying Living Expenses: Not hard at all  Food  Insecurity: No Food Insecurity (06/19/2024)   Hunger Vital Sign    Worried About Running Out of Food in the Last Year: Never true    Ran Out of Food in the Last Year: Never true  Transportation Needs: No Transportation Needs (06/19/2024)   PRAPARE - Administrator, Civil Service (Medical): No    Lack of Transportation (Non-Medical): No  Physical Activity: Inactive (06/19/2024)   Exercise Vital Sign    Days of Exercise per Week: 0 days    Minutes of Exercise per Session: 0 min  Stress: Stress Concern Present (06/19/2024)   [employer] of Occupational Health - Occupational Stress Questionnaire    Feeling of Stress: Rather much  Social Connections: Socially Isolated (06/19/2024)   Social Connection and Isolation Panel    Frequency of Communication with Friends and Family: More than three times a week    Frequency of Social Gatherings with Friends and Family: Once a week    Attends Religious Services: Never    Database Administrator or Organizations: No    Attends Banker Meetings: Never    Marital Status: Widowed   Past Surgical History:  Procedure Laterality Date   BREAST SURGERY     COLONOSCOPY  EYE SURGERY Bilateral    cataract surgery with lens implants   MANDIBLE RECONSTRUCTION     MINOR REMOVAL OF MANDIBULAR HARDWARE Right 10/15/2015   Procedure: MINOR REMOVAL OF Right MANDIBULAR HARDWARE;  Surgeon: Glendia Primrose, DDS;  Location: [facility] OR;  Service: Oral Surgery;  Laterality: Right;   TONSILLECTOMY     Past Surgical History:  Procedure Laterality Date   BREAST SURGERY     COLONOSCOPY     EYE SURGERY Bilateral    cataract surgery with lens implants   MANDIBLE RECONSTRUCTION     MINOR REMOVAL OF MANDIBULAR HARDWARE Right 10/15/2015   Procedure: MINOR REMOVAL OF Right MANDIBULAR HARDWARE;  Surgeon: Glendia Primrose, DDS;  Location: [facility] OR;  Service: Oral Surgery;  Laterality: Right;   TONSILLECTOMY     Past Medical History:  Diagnosis Date   Anemia     low iron   Anxiety    Arthritis    Bipolar disorder (HCC)    Cancer (HCC) 1973   COPD (chronic obstructive pulmonary disease) (HCC)    Family history of adverse reaction to anesthesia    mom was put to sleep and she never woke up -    GERD (gastroesophageal reflux disease)    Guillain Barr syndrome    numbness in toes, legs hurt   Headache    migraines as a teenager   IBS (irritable bowel syndrome)    Lump, breast    removed years ago per Pt.   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, has quiet' seizures   Thyroid  disease    BP 120/76   Pulse 62   Opioid Risk Score:   Fall Risk Score:  `1  Depression screen PHQ 2/9     06/19/2024    4:12 PM 06/10/2024    1:38 PM 04/14/2024    1:32 PM 02/11/2024    1:33 PM 05/02/2023    3:50 PM 04/19/2023    1:43 PM 02/20/2023    1:54 PM  Depression screen PHQ 2/9  Decreased Interest 1 1 0 0 0 0 0  Down, Depressed, Hopeless 1 1 0 0 0 0 0  PHQ - 2 Score 2 2 0 0 0 0 0  Altered sleeping 0        Tired, decreased energy 3        Change in appetite 3        Feeling bad or failure about yourself  0        Trouble concentrating 0        Moving slowly or fidgety/restless 0        Suicidal thoughts 0        PHQ-9 Score 8         Difficult doing work/chores Somewhat difficult           Data saved with a previous flowsheet row definition     Review of Systems  Neurological:  Positive for numbness.  All other systems reviewed and are negative.      Objective:   Physical Exam Vitals and nursing note reviewed.  Constitutional:      Appearance: Normal appearance.  Cardiovascular:     Rate and Rhythm: Normal rate and regular rhythm.     Pulses: Normal pulses.     Heart sounds: Normal heart sounds.  Pulmonary:     Effort: Pulmonary effort is normal.  Breath sounds: Normal breath sounds.  Musculoskeletal:     Comments: Normal Muscle Bulk and Muscle Testing  Reveals:  Upper Extremities: Full ROM and Muscle Strength 5/5 Left AC Joint Tenderness  Lumbar Paraspinal Tenderness: L-4-L-5 Lower Extremities: Right: Decreased ROM and Muscle Strength 5/5 Right Lower Extremity Flexion Produces Pain into her Right Patella Left Lower Extremity: Full ROM and Muscle Strength 5/5 Arrived in Wheelchair      Skin:    General: Skin is warm and dry.  Neurological:     Mental Status: She is alert and oriented to person, place, and time.  Psychiatric:        Mood and Affect: Mood normal.        Behavior: Behavior normal.          Assessment & Plan:  Guillain Barre Syndrome: Continue current medication regimen. Continue Outpatient Therapy. Continue current medication regimen. Continue to Monitor. 10/23/2024 Cervicalgia: Continue HEP as Tolerated. Continue to monitor.10/23/2024 Polyarthralgia: Continue current medication regimen . Continue to Monitor. 10/23/2024 Chronic Bilateral Low Back Pain: Continue HEP as Tolerated. Continue to Monitor. 10/23/2024 Chronic Pain of Bilateral Knee Pain R>L: Continue Outpatient Therapy. Continue current medication regimen. Continue to Monitor. 10/23/2024 Chronic Pain Syndrome:  Continue Oxycodone  5 mg three times a day as needed for pain. #90.  We will continue the opioid monitoring program, this consists of regular clinic visits, examinations, urine drug screen, pill counts as well as use of [place]  Controlled Substance Reporting system. A 12 month History has been reviewed on the [place]  Controlled Substance Reporting System on 10/23/2024   F/U in 2 months

## 2024-10-24 ENCOUNTER — Other Ambulatory Visit: Payer: Self-pay | Admitting: Registered Nurse

## 2024-10-24 DIAGNOSIS — G8929 Other chronic pain: Secondary | ICD-10-CM

## 2024-10-24 DIAGNOSIS — G894 Chronic pain syndrome: Secondary | ICD-10-CM

## 2024-10-27 ENCOUNTER — Other Ambulatory Visit: Payer: Self-pay | Admitting: Medical

## 2024-10-27 ENCOUNTER — Ambulatory Visit: Admitting: Medical

## 2024-10-27 ENCOUNTER — Encounter: Payer: Self-pay | Admitting: Medical

## 2024-10-27 VITALS — BP 108/79 | HR 65 | Ht 63.5 in | Wt 164.4 lb

## 2024-10-27 DIAGNOSIS — L089 Local infection of the skin and subcutaneous tissue, unspecified: Secondary | ICD-10-CM | POA: Diagnosis not present

## 2024-10-27 DIAGNOSIS — J40 Bronchitis, not specified as acute or chronic: Secondary | ICD-10-CM | POA: Diagnosis not present

## 2024-10-27 DIAGNOSIS — J449 Chronic obstructive pulmonary disease, unspecified: Secondary | ICD-10-CM | POA: Diagnosis not present

## 2024-10-27 MED ORDER — DOXYCYCLINE HYCLATE 100 MG PO TABS: 100.0000 mg | ORAL_TABLET | Freq: Two times a day (BID) | ORAL | 0 refills | Status: AC

## 2024-10-27 MED ORDER — PREDNISONE 10 MG PO TABS: ORAL_TABLET | ORAL | 0 refills | Status: DC

## 2024-10-27 NOTE — Patient Instructions (Addendum)
 Skin infection, right elbow(in former abscess area) Residual infection at previous incision and drainage site on right elbow, presenting as tender, swollen area. Possible recurrence. - Prescribed doxycycline  100 mg twice daily for 7 days with food.  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 Current treatment with DuoNeb and ipratropium shows limited efficacy.  - Prescribed 3-day prednisone  taper (10 mg: 3 tablets day 1, 2 tablets day 2, 1 tablet day 3) for shortness of breath or wheezing.(if symptoms persist despite pulmonologist treatment) - Continue nebulizer treatments and recent add on ohtuvayre  to use at night.. - Advised to avoid B vitamins while on doxycycline  due to interaction.   Follow up as scheduled 1-2 month or sooner if needed

## 2024-10-27 NOTE — Progress Notes (Signed)
   Subjective:    Patient ID: Jill Spence, female    DOB: November 15, 1951, 73 y.o.   MRN: [id number]  HPI Discussed the use of AI scribe software for clinical note transcription with the patient, who gave verbal consent to proceed.  History of Present Illness   Jill Spence is a 73 year old female with COPD who presents with persistent elbow pain and respiratory symptoms.  She has ongoing rt elbow forearm and a small residual knot at the prior incision and drainage site done at urgent care a couple of weeks ago. The original redness, swelling, and drainage have improved, but she still has localized pain and a persistent bump.  She has chronic cough with choking and thick mucus that darkens in the evenings, and she becomes short of breath with minimal exertion such as walking between rooms. Cold drafts in her apartment worsen her breathing. She uses a nebulizer and multiple inhalers including a newly prescribed one, but feels they give limited relief. She is on oxygen  at 3 L/min and uses a home concentrator at 2 L/min. She has used prednisone  and Medrol  previously for COPD but had diarrhea with some medications. She eats only one meal a day due to poor appetite and fear of feeling unwell after eating, and is concerned about her nutrition. She lives in a drafty apartment and keeps the heat high to reduce respiratory symptoms.   Pt sees pulmonologist on routine basis and is on 02 3 L daily.          Review of Systems See hpi    Objective:   Physical Exam  General Mental Status- Alert. General Appearance- Not in acute distress.   Skin General: Color- Normal Color. Moisture- Normal Moisture.  Neck Carotid Arteries- Normal color. Moisture- Normal Moisture. No carotid bruits. No JVD.  Chest and Lung Exam Auscultation: Breath Sounds:-even and unalabored but shallow.   Cardiovascular Auscultation:Rythm- RRR Murmurs & Other Heart Sounds:Auscultation of the heart reveals- No  Murmurs.  Abdomen Inspection:-Inspeection Normal. Palpation/Percussion:Note:No mass. Palpation and Percussion of the abdomen reveal- Non Tender, Non Distended + BS, no rebound or guarding.   Neurologic Cranial Nerve exam:- CN III-XII intact(No nystagmus), symmetric smile. Strength:- 5/5 equal and symmetric strength both upper and lower extremities.    Rt forearm- small 1 cm scar that is mild tender but healed. No redness or warmth. No swelling and no fluctuance. Former I and D site.    Assessment & Plan:   Patient Instructions  Skin infection, right elbow(in former abscess area) Residual infection at previous incision and drainage site on right elbow, presenting as tender, swollen area. Possible recurrence. - Prescribed doxycycline  100 mg twice daily for 7 days with food.  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 Current treatment with DuoNeb and ipratropium shows limited efficacy.  - Prescribed 3-day prednisone  taper (10 mg: 3 tablets day 1, 2 tablets day 2, 1 tablet day 3) for shortness of breath or wheezing.(if symptoms persist despite pulmonologist treatment) - Continue nebulizer treatments and recent add on ohtuvayre  to use at night.. - Advised to avoid B vitamins while on doxycycline  due to interaction.   Follow up as scheduled 1-2 month or sooner if needed   [employer], PA-C

## 2024-11-03 ENCOUNTER — Other Ambulatory Visit: Payer: Self-pay

## 2024-11-03 ENCOUNTER — Telehealth: Payer: Self-pay | Admitting: Registered Nurse

## 2024-11-03 DIAGNOSIS — G8929 Other chronic pain: Secondary | ICD-10-CM

## 2024-11-03 DIAGNOSIS — G894 Chronic pain syndrome: Secondary | ICD-10-CM

## 2024-11-03 MED ORDER — OXYCODONE HCL 5 MG PO TABS: 5.0000 mg | ORAL_TABLET | Freq: Three times a day (TID) | ORAL | 0 refills | Status: DC | PRN

## 2024-11-03 NOTE — Telephone Encounter (Signed)
 PDMP was Reviewed.  Oxycodone  e-scribed to pharmacy.

## 2024-11-04 ENCOUNTER — Other Ambulatory Visit: Payer: Self-pay | Admitting: Medical

## 2024-11-04 NOTE — Telephone Encounter (Signed)
 Will check vit d level on next office visit. Did not refill vit d today.

## 2024-11-05 NOTE — Telephone Encounter (Signed)
pt called and notified.

## 2024-11-07 ENCOUNTER — Other Ambulatory Visit: Payer: Self-pay | Admitting: Medical

## 2024-11-10 ENCOUNTER — Other Ambulatory Visit: Payer: Self-pay | Admitting: Medical

## 2024-11-11 ENCOUNTER — Other Ambulatory Visit: Payer: Self-pay | Admitting: Medical

## 2024-11-11 ENCOUNTER — Ambulatory Visit

## 2024-11-14 ENCOUNTER — Other Ambulatory Visit: Payer: Self-pay | Admitting: Licensed Clinical Social Worker

## 2024-11-17 NOTE — Patient Outreach (Signed)
 Complex Care Management   Visit Note  11/14/2024  Name:  Jill Spence MRN: [id number] DOB: 04/21/1951  Situation: Referral received for Complex Care Management related to Mental/Behavioral Health diagnosis Anxiety I obtained verbal consent from Patient.  Visit completed with Patient  on the phone  Background:   Past Medical History:  Diagnosis Date   Anemia    low iron   Anxiety    Arthritis    Bipolar disorder (HCC)    Cancer (HCC) 1973   COPD (chronic obstructive pulmonary disease) (HCC)    Family history of adverse reaction to anesthesia    mom was put to sleep and she never woke up -    GERD (gastroesophageal reflux disease)    Guillain Barr syndrome    numbness in toes, legs hurt   Headache    migraines as a teenager   IBS (irritable bowel syndrome)    Lump, breast    removed years ago per Pt.   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, has quiet' seizures   Thyroid  disease     Assessment: Patient Reported Symptoms:  Cognitive Cognitive Status: No symptoms reported, Alert and oriented to person, place, and time, Normal speech and language skills Cognitive/Intellectual Conditions Management [RPT]: None reported or documented in medical history or problem list   Health Maintenance Behaviors: Annual physical exam  Neurological Neurological Review of Symptoms: Not assessed    HEENT HEENT Symptoms Reported: Not assessed      Cardiovascular Cardiovascular Symptoms Reported: Not assessed    Respiratory Respiratory Symptoms Reported: Productive cough Additional Respiratory Details: Pt reports coughing up thick stuff States she will f/up with Pulmonalogist on 12/16 Respiratory Management Strategies: Adequate rest, Routine screening  Endocrine Endocrine Symptoms Reported: No symptoms reported Is patient diabetic?: No    Gastrointestinal Gastrointestinal Symptoms Reported: Not assessed       Genitourinary Genitourinary Symptoms Reported: No symptoms reported    Integumentary Integumentary Symptoms Reported: No symptoms reported Skin Management Strategies: Routine screening, Coping strategies Skin Comment: Pt reports rash has resolved  Musculoskeletal Musculoskelatal Symptoms Reviewed: Weakness Musculoskeletal Management Strategies: Coping strategies, Routine screening, Exercise, Medical device Musculoskeletal Comment: Pt continues to participate in [facility] PT      Psychosocial Psychosocial Symptoms Reported: No symptoms reported Behavioral Management Strategies: Coping strategies, Support system, Adequate rest Major Change/Loss/Stressor/Fears (CP): Medical condition, self Techniques to Cope with Loss/Stress/Change: Diversional activities      11/17/2024    PHQ2-9 Depression Screening   Little interest or pleasure in doing things    Feeling down, depressed, or hopeless    PHQ-2 - Total Score    Trouble falling or staying asleep, or sleeping too much    Feeling tired or having little energy    Poor appetite or overeating     Feeling bad about yourself - or that you are a failure or have let yourself or your family down    Trouble concentrating on things, such as reading the newspaper or watching television    Moving or speaking so slowly that other people could have noticed.  Or the opposite - being so fidgety or restless that you have been moving around a lot more than usual    Thoughts that you would be better off dead, or hurting yourself in some way    PHQ2-9 Total Score    If you checked off any problems,  how difficult have these problems made it for you to do your work, take care of things at home, or get along with other people    Depression Interventions/Treatment      There were no vitals filed for this visit.    Medications Reviewed Today     Reviewed by Ezzard Rolin BIRCH, LCSW (Social Worker) on 11/17/24 at 1228  Med List Status: <None>   Medication  Order Taking? Sig Documenting Provider Last Dose Status Informant  albuterol  (PROVENTIL ) (2.5 MG/3ML) 0.083% nebulizer solution [id number]  Take 3 mLs (2.5 mg total) by nebulization every 6 (six) hours as needed for wheezing. Desai, Nikita S, MD  Active   albuterol  (VENTOLIN  HFA) 108 [phone] Base) MCG/ACT inhaler [id number]  Inhale 2 puffs into the lungs every 6 (six) hours as needed for wheezing. Desai, Nikita S, MD  Active   alclomethasone (ACLOVATE) 0.05 % cream [id number]  Apply topically 2 (two) times daily as needed. [provider]  Active   alendronate (FOSAMAX) 70 MG tablet [id number]  Take 70 mg by mouth every Sunday. Take with a full glass of water on an empty stomach. [provider]  Active Self  ARIPiprazole (ABILIFY) 15 MG tablet [id number]  Take 15 mg by mouth daily. [provider]  Active Self  B Complex Vitamins (VITAMIN B COMPLEX ) TABS [id number]  Take 1 tablet by mouth daily. Mannam, Praveen, MD  Active   benzonatate  (TESSALON ) 100 MG capsule [id number]  TAKE ONE CAPSULE BY MOUTH THREE TIMES DAILY AS NEEDED FOR COUGH Saguier, [place], PA-C  Active   benztropine (COGENTIN) 0.5 MG tablet [id number]  Take 0.5 mg by mouth 2 (two) times daily. [provider]  Active Self  Bepotastine  Besilate (BEPREVE) 1.5 % SOLN [id number]  Place 1 drop into both eyes 2 (two) times daily as needed. Saguier, [place], PA-C  Active   Bepotastine  Besilate 1.5 % SOLN [id number]  Apply to eye. [provider]  Active   cetirizine  (ZYRTEC ) 10 MG tablet [id number]  Take by mouth. [provider]  Active   clonazePAM (KLONOPIN) 0.5 MG tablet [id number]  Take by mouth. [provider]  Active   cromolyn (OPTICROM) 4 % ophthalmic solution [id number]  1 drop 4 (four) times daily. [provider]  Active   diclofenac  Sodium (VOLTAREN  ARTHRITIS PAIN) 1 % GEL [id number]  Apply 4 g topically 4 (four) times daily. Saguier, [place], PA-C  Active   diclofenac  Sodium  (VOLTAREN ) 1 % GEL [id number]  Apply 4 g topically 4 (four) times daily. Saguier, [place], PA-C  Active   dicyclomine  (BENTYL ) 20 MG tablet [id number]  Take 1 tablet (20 mg total) by mouth 3 (three) times daily as needed. Saguier, [place], PA-C  Active   doxycycline  (VIBRA -TABS) 100 MG tablet [id number]  Take 1 tablet (100 mg total) by mouth 2 (two) times daily. Saguier, [place], PA-C  Active   Ensifentrine  (OHTUVAYRE ) 3 MG/2.5ML SUSP [id number]  Inhale 1 ampule into the lungs in the morning and at bedtime. Hope Almarie ORN, NP  Active   famciclovir  (FAMVIR ) 250 MG tablet [id number]  Take 1 tablet (250 mg total) by mouth 2 (two) times daily. Saguier, Edward, PA-C  Active   Fluticasone-Umeclidin-Vilant (TRELEGY ELLIPTA ) 200-62.5-25 MCG/ACT AEPB [id number]  Inhale 1 puff into the lungs daily. Saguier, [place], PA-C  Active   gabapentin  (NEURONTIN ) 400 MG capsule [id number]  Take 3 capsules (1,200 mg total) by mouth 3 (three) times daily. Dorina [place], PA-C  Expired 10/14/24 2359   hydrOXYzine  (  ATARAX ) 10 MG tablet [id number]  Take 1-2 tablets (10-20 mg total) by mouth at bedtime as needed for anxiety (insomnia). Saguier, [place], PA-C  Active   ibuprofen  (ADVIL ) 200 MG tablet [id number]  1-4 tab po every 8 hours prn moderate pain Saguier, Edward, PA-C  Active   ibuprofen  (ADVIL ) 600 MG tablet [id number]  TAKE ONE TABLET BY MOUTH TWICE DAILY AS NEEDED FOR knee pain. Saguier, [place], PA-C  Active   ipratropium-albuterol  (DUONEB) 0.5-2.5 (3) MG/3ML SOLN [id number]  Take 3 mLs by nebulization every 4 (four) hours as needed. Saguier, [place], PA-C  Active   levocetirizine (XYZAL ) 5 MG tablet [id number]  Take 1 tablet (5 mg total) by mouth every evening. Saguier, [place], PA-C  Active   levocetirizine (XYZAL ) 5 MG tablet [id number]  Take 1 tablet (5 mg total) by mouth every evening.  Patient not taking: Reported on 10/23/2024   SaguierD[place], PA-C  Active   levocetirizine (XYZAL ) 5 MG tablet [id number]  Take 1 tablet (5 mg  total) by mouth every evening.  Patient not taking: Reported on 10/23/2024   SaguierD[place], PA-C  Active   loperamide  (IMODIUM ) 2 MG capsule [id number]  TAKE ONE CAPSULE BY MOUTH EVERY 6 TO 8 HOURS AS NEEDED FOR DIARRHEA. Saguier, [place], PA-C  Active   losartan  (COZAAR ) 50 MG tablet [id number]  Take 1 tablet (50 mg total) by mouth daily. Saguier, [place], PA-C  Active   magnesium oxide (MAG-OX) 400 MG tablet [id number]  Take 250 mg by mouth daily. [provider]  Active Self  methimazole  (TAPAZOLE ) 5 MG tablet [id number]  Take 1 tablet (5 mg total) by mouth daily. Saguier, [place], PA-C  Active   methylPREDNISolone  (MEDROL ) 4 MG tablet [id number]  Standard 6 day taper dose pack Saguier, [place], PA-C  Active   montelukast  (SINGULAIR ) 10 MG tablet [id number]  Take 1 tablet (10 mg total) by mouth at bedtime. Saguier, [place], PA-C  Active   mupirocin  ointment (BACTROBAN ) 2 % [id number]  Apply 1 Application topically 2 (two) times daily. Saguier, Edward, PA-C  Active   NONFORMULARY OR COMPOUNDED ITEM [id number]  Elevated toilet seat Saguier, Edward, PA-C  Active   olopatadine  (PATANOL) 0.1 % ophthalmic solution [id number]  Place 1 drop into both eyes 2 (two) times daily. Saguier, [place], PA-C  Active   olopatadine  (PATANOL) 0.1 % ophthalmic solution [id number]  Place 1 drop into both eyes 2 (two) times daily. Saguier, [place], PA-C  Active   oxcarbazepine (TRILEPTAL) 600 MG tablet [id number]  Take 600 mg by mouth 2 (two) times daily. [provider]  Active Self  oxyCODONE  (ROXICODONE ) 5 MG immediate release tablet [id number]  Take 1 tablet (5 mg total) by mouth 3 (three) times daily as needed for moderate pain (pain score 4-6). Debby Fidela CROME, NP  Active   pantoprazole  (PROTONIX ) 40 MG tablet [id number]  Take 1 tablet (40 mg total) by mouth daily. Saguier, [place], PA-C  Active   potassium chloride  SA (KLOR-CON  M) 20 MEQ tablet [id number]  Take 1 tablet (20 mEq total) by mouth 2 (two) times daily for  7 days. Saguier, Edward, PA-C  Expired 10/01/24 2359   predniSONE  (DELTASONE ) 10 MG tablet [id number]  Take 1 tablet (10 mg total) by mouth daily with breakfast. Hope Almarie ORN, NP  Active   predniSONE  (DELTASONE ) 10 MG tablet [id number]  3 tab po day 1, 2 tab po day 2 and 1 tab po day 3 Saguier, [place], [place]  Active   sodium chloride  HYPERTONIC 3 %  nebulizer solution [id number]  3 ml via neb twice daily Mannam, Praveen, MD  Active   traZODone (DESYREL) 50 MG tablet [id number]  Take 50 mg by mouth at bedtime. [provider]  Active   triamcinolone  cream (KENALOG ) 0.1 % [id number]  Apply topically 2 (two) times daily.  Patient not taking: Reported on 10/01/2024   Saguier, Edward, PA-C  Active   triamcinolone  cream (KENALOG ) 0.1 % [id number]  APPLY TOPICALLY TWICE DAILY AS NEEDED Saguier, [place], PA-C  Active   Vitamin D , Ergocalciferol , (DRISDOL ) 1.25 MG (50000 UNIT) CAPS capsule [id number]  1 capsule per month  Patient not taking: Reported on 10/01/2024   Saguier, [place], PA-C  Active   Vitamin D , Ergocalciferol , (DRISDOL ) 1.25 MG (50000 UNIT) CAPS capsule [id number]  TAKE ONE CAPSULE BY MOUTH ONCE WEEKLY FOR SIX WEEKS Saguier, [place] RIGGERS  Active   Med List Note Arvie Leila MATSU 02/08/24 1350): Patient is asking for medication to be refilled today, patient is in pain.             Recommendation:   Specialty provider follow-up Pulmonologist on 11/18/24  Follow Up Plan:   Telephone follow-up in 1 month  Rolin Kerns, LCSW [place]  [facility], [facility] Clinical Social Worker Direct Dial: [phone]  Fax: [phone] Website: [email or web address] 12:49 PM

## 2024-11-17 NOTE — Patient Instructions (Signed)
 Visit Information  Thank you for taking time to visit with me today. Please don't hesitate to contact me if I can be of assistance to you before our next scheduled appointment.  Your next care management appointment is by telephone on 01/09 at 2 PM  Please call the care guide team at [phone] if you need to cancel, schedule, or reschedule an appointment.   Please call the Suicide and Crisis Lifeline: 988 go to [facility] [address], [place] [phone]) call 911 if you are experiencing a Mental Health or Behavioral Health Crisis or need someone to talk to.  Rolin Kerns, LCSW [place]  [facility], [facility] Clinical Social Worker Direct Dial: [phone]  Fax: [phone] Website: [email or web address] 12:50 PM

## 2024-11-18 ENCOUNTER — Other Ambulatory Visit: Payer: Self-pay | Admitting: Registered Nurse

## 2024-11-18 ENCOUNTER — Ambulatory Visit: Admitting: Pulmonary Disease

## 2024-11-18 ENCOUNTER — Encounter: Payer: Self-pay | Admitting: Pulmonary Disease

## 2024-11-18 VITALS — BP 129/83 | HR 68 | Wt 165.2 lb

## 2024-11-18 DIAGNOSIS — J449 Chronic obstructive pulmonary disease, unspecified: Secondary | ICD-10-CM

## 2024-11-18 DIAGNOSIS — J432 Centrilobular emphysema: Secondary | ICD-10-CM

## 2024-11-18 DIAGNOSIS — J9611 Chronic respiratory failure with hypoxia: Secondary | ICD-10-CM

## 2024-11-18 DIAGNOSIS — R9389 Abnormal findings on diagnostic imaging of other specified body structures: Secondary | ICD-10-CM

## 2024-11-18 DIAGNOSIS — G8929 Other chronic pain: Secondary | ICD-10-CM

## 2024-11-18 DIAGNOSIS — G894 Chronic pain syndrome: Secondary | ICD-10-CM

## 2024-11-18 MED ORDER — PREDNISONE 20 MG PO TABS: 40.0000 mg | ORAL_TABLET | Freq: Every day | ORAL | 0 refills | Status: AC

## 2024-11-18 MED ORDER — AZITHROMYCIN 250 MG PO TABS: ORAL_TABLET | ORAL | 0 refills | Status: AC

## 2024-11-18 NOTE — Progress Notes (Signed)
 Jill Spence    [id number]    05-06-1951  Primary Care Physician:Saguier, [place] RIGGERS  Referring Physician: Dorina [place], PA-C [address] [place],  [place] [address]  Chief complaint:   Patient with chronic obstructive pulmonary disease In for follow-up  Discussed the use of AI scribe software for clinical note transcription with the patient, who gave verbal consent to proceed.  History of Present Illness Jill Spence is a 73 year old female with pulmonary fibrosis and emphysema who presents with increased fatigue and dyspnea.  She has been experiencing increased fatigue, particularly when walking from one room to another, and sometimes feels like she might pass out. Her activity level was previously normal but has declined recently. She finds daily activities, such as making her bed, more challenging due to fatigue.  She experiences difficulty breathing, especially when transitioning from heat to cold environments, and reports coughing up thick mucus. She has not participated in pulmonary rehabilitation recently but has done it in the past. She uses oxygen  more frequently and has a history of pulmonary fibrosis and emphysema, diagnosed 16 years ago. She quit smoking 30 years ago.  She has been on Trelegy for a couple of years but does not feel any change with it. She uses a nebulizer when feeling bad but avoids daily use because she does not want to become dependent, although her doctor has explained that there is no dependency risk with inhalers. She recently completed a course of doxycycline  and reports that her mucus is becoming thick and yellow again, causing concern. She is not allergic to antibiotics but experiences diarrhea with some, which she cannot recall by name.  She has a family history of lung disease, with her sister having died from the same condition. She attributes some of her lung issues to [place], where she initially experienced breathing difficulties.  Shortness of breath with most activities Denies chest pains or chest discomfort  Does have a cough with sputum production  No fevers, no chills  Longstanding history of obstructive lung disease   Outpatient Encounter Medications as of 11/18/2024  Medication Sig   albuterol  (PROVENTIL ) (2.5 MG/3ML) 0.083% nebulizer solution Take 3 mLs (2.5 mg total) by nebulization every 6 (six) hours as needed for wheezing.   albuterol  (VENTOLIN  HFA) 108 (90 Base) MCG/ACT inhaler Inhale 2 puffs into the lungs every 6 (six) hours as needed for wheezing.   alclomethasone (ACLOVATE) 0.05 % cream Apply topically 2 (two) times daily as needed.   alendronate (FOSAMAX) 70 MG tablet Take 70 mg by mouth every Sunday. Take with a full glass of water on an empty stomach.   ARIPiprazole (ABILIFY) 15 MG tablet Take 15 mg by mouth daily.   azithromycin  (ZITHROMAX  Z-PAK) 250 MG tablet Take 2 tablets day 1 and then 1 daily for 4 days   B Complex Vitamins (VITAMIN B COMPLEX ) TABS Take 1 tablet by mouth daily.   benzonatate  (TESSALON ) 100 MG capsule TAKE ONE CAPSULE BY MOUTH THREE TIMES DAILY AS NEEDED FOR COUGH   benztropine (COGENTIN) 0.5 MG tablet Take 0.5 mg by mouth 2 (two) times daily.   Bepotastine  Besilate (BEPREVE) 1.5 % SOLN Place 1 drop into both eyes 2 (two) times daily as needed.   Bepotastine  Besilate 1.5 % SOLN Apply to eye.   cetirizine  (ZYRTEC ) 10 MG tablet Take by mouth.   clonazePAM (KLONOPIN)  0.5 MG tablet Take by mouth.   cromolyn (OPTICROM) 4 % ophthalmic solution 1 drop 4 (four) times daily.   diclofenac  Sodium (VOLTAREN  ARTHRITIS PAIN) 1 % GEL Apply 4 g topically 4 (four) times daily.   diclofenac  Sodium (VOLTAREN ) 1 % GEL Apply 4 g topically 4 (four) times daily.   dicyclomine  (BENTYL ) 20 MG tablet Take 1 tablet (20 mg total) by mouth 3 (three) times daily as needed.   doxycycline  (VIBRA -TABS) 100 MG  tablet Take 1 tablet (100 mg total) by mouth 2 (two) times daily.   Ensifentrine  (OHTUVAYRE ) 3 MG/2.5ML SUSP Inhale 1 ampule into the lungs in the morning and at bedtime.   famciclovir  (FAMVIR ) 250 MG tablet Take 1 tablet (250 mg total) by mouth 2 (two) times daily.   Fluticasone-Umeclidin-Vilant (TRELEGY ELLIPTA ) 200-62.5-25 MCG/ACT AEPB Inhale 1 puff into the lungs daily.   gabapentin  (NEURONTIN ) 400 MG capsule Take 3 capsules (1,200 mg total) by mouth 3 (three) times daily.   hydrOXYzine  (ATARAX ) 10 MG tablet Take 1-2 tablets (10-20 mg total) by mouth at bedtime as needed for anxiety (insomnia).   ibuprofen  (ADVIL ) 200 MG tablet 1-4 tab po every 8 hours prn moderate pain   ibuprofen  (ADVIL ) 600 MG tablet TAKE ONE TABLET BY MOUTH TWICE DAILY AS NEEDED FOR knee pain.   ipratropium-albuterol  (DUONEB) 0.5-2.5 (3) MG/3ML SOLN Take 3 mLs by nebulization every 4 (four) hours as needed.   levocetirizine (XYZAL ) 5 MG tablet Take 1 tablet (5 mg total) by mouth every evening.   levocetirizine (XYZAL ) 5 MG tablet Take 1 tablet (5 mg total) by mouth every evening.   levocetirizine (XYZAL ) 5 MG tablet Take 1 tablet (5 mg total) by mouth every evening.   loperamide  (IMODIUM ) 2 MG capsule TAKE ONE CAPSULE BY MOUTH EVERY 6 TO 8 HOURS AS NEEDED FOR DIARRHEA.   losartan  (COZAAR ) 50 MG tablet Take 1 tablet (50 mg total) by mouth daily.   magnesium oxide (MAG-OX) 400 MG tablet Take 250 mg by mouth daily.   methimazole  (TAPAZOLE ) 5 MG tablet Take 1 tablet (5 mg total) by mouth daily.   methylPREDNISolone  (MEDROL ) 4 MG tablet Standard 6 day taper dose pack   montelukast  (SINGULAIR ) 10 MG tablet Take 1 tablet (10 mg total) by mouth at bedtime.   mupirocin  ointment (BACTROBAN ) 2 % Apply 1 Application topically 2 (two) times daily.   NONFORMULARY OR COMPOUNDED ITEM Elevated toilet seat   olopatadine  (PATANOL) 0.1 % ophthalmic solution Place 1 drop into both eyes 2 (two) times daily.   olopatadine  (PATANOL) 0.1 %  ophthalmic solution Place 1 drop into both eyes 2 (two) times daily.   oxcarbazepine (TRILEPTAL) 600 MG tablet Take 600 mg by mouth 2 (two) times daily.   oxyCODONE  (ROXICODONE ) 5 MG immediate release tablet Take 1 tablet (5 mg total) by mouth 3 (three) times daily as needed for moderate pain (pain score 4-6).   pantoprazole  (PROTONIX ) 40 MG tablet Take 1 tablet (40 mg total) by mouth daily.   potassium chloride  SA (KLOR-CON  M) 20 MEQ tablet Take 1 tablet (20 mEq total) by mouth 2 (two) times daily for 7 days.   predniSONE  (DELTASONE ) 10 MG tablet Take 1 tablet (10 mg total) by mouth daily with breakfast.   predniSONE  (DELTASONE ) 10 MG tablet 3 tab po day 1, 2 tab po day 2 and 1 tab po day 3   predniSONE  (DELTASONE ) 20 MG tablet Take 2 tablets (40 mg total) by mouth daily with breakfast for 5 days.  sodium chloride  HYPERTONIC 3 % nebulizer solution 3 ml via neb twice daily   traZODone (DESYREL) 50 MG tablet Take 50 mg by mouth at bedtime.   triamcinolone  cream (KENALOG ) 0.1 % Apply topically 2 (two) times daily.   triamcinolone  cream (KENALOG ) 0.1 % APPLY TOPICALLY TWICE DAILY AS NEEDED   Vitamin D , Ergocalciferol , (DRISDOL ) 1.25 MG (50000 UNIT) CAPS capsule 1 capsule per month   Vitamin D , Ergocalciferol , (DRISDOL ) 1.25 MG (50000 UNIT) CAPS capsule TAKE ONE CAPSULE BY MOUTH ONCE WEEKLY FOR SIX WEEKS   No facility-administered encounter medications on file as of 11/18/2024.    Allergies as of 11/18/2024 - Review Complete 11/18/2024  Allergen Reaction Noted   Influenza vaccines  08/04/2013   Aspirin  08/04/2013    Past Medical History:  Diagnosis Date   Anemia    low iron   Anxiety    Arthritis    Bipolar disorder (HCC)    Cancer (HCC) 1973   COPD (chronic obstructive pulmonary disease) (HCC)    Family history of adverse reaction to anesthesia    mom was put to sleep and she never woke up -    GERD (gastroesophageal reflux disease)    Guillain Barr syndrome    numbness in  toes, legs hurt   Headache    migraines as a teenager   IBS (irritable bowel syndrome)    Lump, breast    removed years ago per Pt.   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, has quiet' seizures   Thyroid  disease     Past Surgical History:  Procedure Laterality Date   BREAST SURGERY     COLONOSCOPY     EYE SURGERY Bilateral    cataract surgery with lens implants   MANDIBLE RECONSTRUCTION     MINOR REMOVAL OF MANDIBULAR HARDWARE Right 10/15/2015   Procedure: MINOR REMOVAL OF Right MANDIBULAR HARDWARE;  Surgeon: Glendia Primrose, DDS;  Location: [facility] OR;  Service: Oral Surgery;  Laterality: Right;   TONSILLECTOMY      Family History  Problem Relation Age of Onset   Cancer Father    Emphysema Sister     Social History   Socioeconomic History   Marital status: Widowed    Spouse name: Not on file   Number of children: Not on file   Years of education: Not on file   Highest education level: Some college, no degree  Occupational History   Not on file  Tobacco Use   Smoking status: Former    Current packs/day: 0.00    Average packs/day: 0.3 packs/day for 6.0 years (1.5 ttl pk-yrs)    Types: Cigarettes    Start date: 10/13/2001    Quit date: 10/14/2007    Years since quitting: 17.1   Smokeless tobacco: Never  Vaping Use   Vaping status: Never Used  Substance and Sexual Activity   Alcohol use: No   Drug use: No   Sexual activity: Not on file  Other Topics Concern   Not on file  Social History Narrative   Not on file   Social Drivers of Health   Tobacco Use: Medium Risk (11/18/2024)   Patient History    Smoking Tobacco Use: Former    Smokeless Tobacco Use: Never    Passive Exposure: Not on file  Financial Resource Strain: Low Risk (06/19/2024)   Overall Financial Resource Strain (CARDIA)  Difficulty of Paying Living Expenses: Not hard at all  Food Insecurity: No Food Insecurity (06/19/2024)    Epic    Worried About Programme Researcher, Broadcasting/film/video in the Last Year: Never true    Ran Out of Food in the Last Year: Never true  Transportation Needs: No Transportation Needs (06/19/2024)   Epic    Lack of Transportation (Medical): No    Lack of Transportation (Non-Medical): No  Physical Activity: Inactive (06/19/2024)   Exercise Vital Sign    Days of Exercise per Week: 0 days    Minutes of Exercise per Session: 0 min  Stress: Stress Concern Present (06/19/2024)   [employer] of Occupational Health - Occupational Stress Questionnaire    Feeling of Stress: Rather much  Social Connections: Socially Isolated (06/19/2024)   Social Connection and Isolation Panel    Frequency of Communication with Friends and Family: More than three times a week    Frequency of Social Gatherings with Friends and Family: Once a week    Attends Religious Services: Never    Database Administrator or Organizations: No    Attends Banker Meetings: Never    Marital Status: Widowed  Intimate Partner Violence: Not At Risk (06/19/2024)   Epic    Fear of Current or Ex-Partner: No    Emotionally Abused: No    Physically Abused: No    Sexually Abused: No  Depression (PHQ2-9): Medium Risk (06/19/2024)   Depression (PHQ2-9)    PHQ-2 Score: 8  Alcohol Screen: Low Risk (06/19/2024)   Alcohol Screen    Last Alcohol Screening Score (AUDIT): 0  Housing: Unknown (06/19/2024)   Epic    Unable to Pay for Housing in the Last Year: No    Number of Times Moved in the Last Year: Not on file    Homeless in the Last Year: No  Utilities: Not At Risk (06/19/2024)   Epic    Threatened with loss of utilities: No  Health Literacy: Adequate Health Literacy (06/19/2024)   B1300 Health Literacy    Frequency of need for help with medical instructions: Never    Review of Systems  Constitutional:  Positive for fatigue.  Respiratory:  Positive for shortness of breath.   Musculoskeletal:  Positive for arthralgias.     Vitals:   11/18/24 1313  BP: 129/83  Pulse: 68  SpO2: 93%     Physical Exam Constitutional:      Appearance: Normal appearance.  HENT:     Head: Normocephalic.     Mouth/Throat:     Mouth: Mucous membranes are moist.  Eyes:     General: No scleral icterus. Cardiovascular:     Rate and Rhythm: Normal rate and regular rhythm.     Heart sounds: No murmur heard.    No friction rub.  Pulmonary:     Effort: No respiratory distress.     Breath sounds: No stridor. No wheezing or rhonchi.     Comments: Decreased air movement bilaterally Musculoskeletal:     Cervical back: No rigidity or tenderness.  Neurological:     Mental Status: She is alert.    Data Reviewed: CT scan of the chest was reviewed and reviewed with the patient showing extensive emphysematous changes  PFT 06/06/2023 reviewed showing severe obstructive disease with no significant bronchodilator response-FEV1 of 32%  Echocardiogram 04/11/2023 with ejection fraction of 50 to 55%, mildly reduced right ventricular systolic function pulmonary pressures not assessed  Assessment and Plan Assessment & Plan Pulmonary fibrosis  and emphysema with acute exacerbation Chronic pulmonary fibrosis and emphysema with recent acute exacerbation characterized by increased dyspnea, fatigue, and thick yellow sputum production. Significant decline in lung function over the past three years, with FEV1 decreasing from 44% to 32% and oxygen  exchange from 26% to 22%. CT scan shows emphysema with abnormal lung appearance. No genetic marker identified for hereditary component. Current treatment includes Trelegy inhaler, but she reports no perceived benefit. Discussed the importance of pulmonary rehabilitation and inhalers to maintain lung function and manage symptoms. Introduced designer, industrial/product as a new inhaler option to improve symptoms and reduce dyspnea. Emphasized the importance of pacing activities to avoid frustration and maintaining exercise to  support muscle function. - Continue Trelegy inhaler. - Added encephentron inhaler to current regimen. - Prescribed azithromycin  (Z-Pak) for acute exacerbation. - Prescribed prednisone  for acute exacerbation. - Encouraged continuation of pulmonary rehabilitation. - Educated on the importance of using inhalers as needed to maintain open airways.  Chronic respiratory failure with hypoxemia Chronic respiratory failure with hypoxemia secondary to pulmonary fibrosis and emphysema. Oxygen  therapy is required to manage hypoxemia. Discussed the importance of maintaining lung function through inhalers and pulmonary rehabilitation to improve quality of life and reduce symptoms. - Continue oxygen  therapy as needed. - Encouraged use of inhalers to maintain open airways.  Allergic rhinitis No specific treatment changes discussed during this visit.  Musculoskeletal pain and deconditioning likely contributing to her shortness of breath  Follow-up in about 2 months  Encouraged to call with significant concerns   No orders of the defined types were placed in this encounter.     Jennet Epley MD [facility] 11/18/2024, 3:00 PM  CC: Saguier, Edward, PA-C

## 2024-11-18 NOTE — Patient Instructions (Signed)
 Follow-up in about 2 months  Make sure you start using the Ohtuvayre  which is nebulization treatments twice a day  Continue your Trelegy  Continue with pulmonary rehab  Call us  with significant concerns  I did send in a prescription for antibiotics and steroids to help you with the current exacerbation  Call us  with significant concerns

## 2024-11-20 ENCOUNTER — Other Ambulatory Visit: Payer: Self-pay | Admitting: Pulmonary Disease

## 2024-11-20 DIAGNOSIS — J4489 Other specified chronic obstructive pulmonary disease: Secondary | ICD-10-CM

## 2024-11-20 NOTE — Telephone Encounter (Signed)
 Pt requesting refill of specialty medication - routing to Rx team to advise.

## 2024-11-21 ENCOUNTER — Telehealth: Payer: Self-pay | Admitting: *Deleted

## 2024-11-21 NOTE — Telephone Encounter (Signed)
 Patient inquiring as to if the may be an option to loan one until she finds hers. Is this an option? If so please call patient.  Patient has misplaced her nebulizer. Patient was a new start in Sept 25. Made aware insurance won't cover another neb and we don't have a loaner nebulizer in the office. Suggested she could purchase one online. NFN

## 2024-11-21 NOTE — Telephone Encounter (Signed)
 Spoke with pt and advised that she could purchase from [employer] supply out of pocket. Pt verbalized understanding and was thankful. NFN

## 2024-11-24 ENCOUNTER — Ambulatory Visit: Admitting: Medical

## 2024-12-02 ENCOUNTER — Telehealth: Payer: Self-pay

## 2024-12-02 NOTE — Telephone Encounter (Signed)
 Copied from CRM [id number]. Topic: Clinical - Order For Equipment >> Dec 02, 2024  1:22 PM Ismael A wrote: Reason for CRM: insurance changing to [employer] 12/04/24, pt currently uses [facility] for respiratory therapy but [facility] does not accept echostar so she is needing a new agency to continue to do her respiratory therapy with. - Hadassah Pesa called on her behalf, ph# [phone]   [facility] any info on who her insurance will work with?

## 2024-12-05 ENCOUNTER — Ambulatory Visit: Admitting: Medical

## 2024-12-09 ENCOUNTER — Telehealth: Payer: Self-pay

## 2024-12-09 DIAGNOSIS — G8929 Other chronic pain: Secondary | ICD-10-CM

## 2024-12-09 DIAGNOSIS — G894 Chronic pain syndrome: Secondary | ICD-10-CM

## 2024-12-09 DIAGNOSIS — M545 Low back pain, unspecified: Secondary | ICD-10-CM

## 2024-12-09 MED ORDER — OXYCODONE HCL 5 MG PO TABS: 5.0000 mg | ORAL_TABLET | Freq: Three times a day (TID) | ORAL | 0 refills | Status: DC | PRN

## 2024-12-09 NOTE — Telephone Encounter (Signed)
 Patient left voicemail stating she is returning a call from our NP in the office.  Message will be sent to make aware.

## 2024-12-09 NOTE — Telephone Encounter (Signed)
 PDMP was Reviewed.  Oxycodone  e-scribed to pharmacy.  Call placed to Jill Spence, mo answer. Left message to return the call.

## 2024-12-09 NOTE — Telephone Encounter (Signed)
 Patient requesting refill, she states she has #11 left and next appt 1/28

## 2024-12-09 NOTE — Telephone Encounter (Signed)
 Return Ms. Masso call,  She is aware her prescription was sent to pharmacy, she verbalizes understanding.

## 2024-12-10 ENCOUNTER — Encounter: Payer: Self-pay | Admitting: Medical

## 2024-12-10 ENCOUNTER — Telehealth: Payer: Self-pay | Admitting: Medical

## 2024-12-10 ENCOUNTER — Ambulatory Visit (INDEPENDENT_AMBULATORY_CARE_PROVIDER_SITE_OTHER): Admitting: Medical

## 2024-12-10 VITALS — BP 114/76 | HR 56 | Temp 98.4°F | Resp 16 | Ht 63.5 in | Wt 165.4 lb

## 2024-12-10 DIAGNOSIS — J449 Chronic obstructive pulmonary disease, unspecified: Secondary | ICD-10-CM | POA: Diagnosis not present

## 2024-12-10 DIAGNOSIS — M79662 Pain in left lower leg: Secondary | ICD-10-CM | POA: Diagnosis not present

## 2024-12-10 DIAGNOSIS — M5432 Sciatica, left side: Secondary | ICD-10-CM

## 2024-12-10 DIAGNOSIS — G8929 Other chronic pain: Secondary | ICD-10-CM

## 2024-12-10 DIAGNOSIS — I10 Essential (primary) hypertension: Secondary | ICD-10-CM | POA: Diagnosis not present

## 2024-12-10 DIAGNOSIS — M25561 Pain in right knee: Secondary | ICD-10-CM | POA: Diagnosis not present

## 2024-12-10 DIAGNOSIS — R944 Abnormal results of kidney function studies: Secondary | ICD-10-CM | POA: Diagnosis not present

## 2024-12-10 DIAGNOSIS — K219 Gastro-esophageal reflux disease without esophagitis: Secondary | ICD-10-CM | POA: Diagnosis not present

## 2024-12-10 DIAGNOSIS — M2550 Pain in unspecified joint: Secondary | ICD-10-CM | POA: Diagnosis not present

## 2024-12-10 DIAGNOSIS — E559 Vitamin D deficiency, unspecified: Secondary | ICD-10-CM | POA: Diagnosis not present

## 2024-12-10 DIAGNOSIS — R5383 Other fatigue: Secondary | ICD-10-CM

## 2024-12-10 MED ORDER — METHYLPREDNISOLONE 4 MG PO TABS: ORAL_TABLET | ORAL | 0 refills | Status: AC

## 2024-12-10 MED ORDER — IBUPROFEN 600 MG PO TABS: 600.0000 mg | ORAL_TABLET | Freq: Three times a day (TID) | ORAL | 0 refills | Status: AC | PRN

## 2024-12-10 NOTE — Progress Notes (Signed)
 "  Subjective:    Patient ID: Jill Spence, female    DOB: Oct 05, 1951, 74 y.o.   MRN: [id number]  HPI Pt in for follow up.  Last AVS with me below in    Skin infection, right elbow(in former abscess area) Residual infection at previous incision and drainage site on right elbow, presenting as tender, swollen area. Possible recurrence. - Prescribed doxycycline  100 mg twice daily for 7 days with food.   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 Current treatment with DuoNeb and ipratropium shows limited efficacy.  - Prescribed 3-day prednisone  taper (10 mg: 3 tablets day 1, 2 tablets day 2, 1 tablet day 3) for shortness of breath or wheezing.(if symptoms persist despite pulmonologist treatment) - Continue nebulizer treatments and recent add on ohtuvayre  to use at night.. - Advised to avoid B vitamins while on doxycycline  due to interaction.   Pt last AVS from pulmonlogist below.   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 Significant decline in lung function over the past three years, with FEV1 decreasing from 44% to 32% and oxygen  exchange from 26% to 22%. CT scan shows emphysema with abnormal lung appearance. No genetic marker identified for hereditary component. Current treatment includes Trelegy inhaler, but she reports no perceived benefit. Discussed the importance of pulmonary rehabilitation and inhalers to maintain lung function and manage symptoms. Introduced designer, industrial/product as a new inhaler option to improve symptoms and reduce dyspnea. Emphasized the importance of pacing activities to avoid frustration and maintaining exercise to support muscle function. - Continue Trelegy inhaler. -  Added encephentron inhaler to current regimen. - Prescribed azithromycin  (Z-Pak) for acute exacerbation. - Prescribed prednisone  for acute exacerbation. - Encouraged continuation of pulmonary rehabilitation. - Educated on the importance of using inhalers as needed to maintain open airways.   Chronic respiratory failure with hypoxemia Chronic respiratory failure with hypoxemia secondary to pulmonary fibrosis and emphysema. Oxygen  therapy is required to manage hypoxemia. Discussed the importance of maintaining lung function through inhalers and pulmonary rehabilitation to improve quality of life and reduce symptoms. - Continue oxygen  therapy as needed. - Encouraged use of inhalers to maintain open airways.   Allergic rhinitis No specific treatment changes discussed during this visit.   Musculoskeletal pain and deconditioning likely contributing to her shortness of breath   Follow-up in about 2 months     Jill Spence is a 74 year old female with stage three COPD who presents for management of her condition and associated symptoms.  She is concerned that her oxygen  concentrator and supplies will be discontinued by her current company in the next two weeks. She is working with her pulmonologist for refills and is worried about affording replacement oxygen  supplies.  She has chronic left calf pain described as sciatica, right knee pain, and other joint pains. She uses ibuprofen  600 mg sparingly. Prior left calf ultrasound was negative and she has no major swelling or asymmetry.  She takes losartan  for blood pressure and Protonix  for GERD, with refills available.  She takes vitamin D  but has not had recent vitamin D  levels checked. She feels tired and is undergoing evaluation for fatigue with CBC, iron studies, thyroid  studies, B12, B1, and an iron panel.  She had an elbow infection treated with doxycycline  and no obvious pain with touch vut  some chronic skin irritation/hyperpigmentation   in that area.  Her lips bleed and crack, especially when she uses oxygen  for long periods. She uses Chapstick with vitamin E for relief.      Review of Systems  Constitutional:  Positive for fatigue. Negative for appetite change.  HENT:  Negative for congestion, ear pain and mouth sores.   Respiratory:  Positive for shortness of breath. Negative for chest tightness and wheezing.   Cardiovascular:  Negative for chest pain and palpitations.  Gastrointestinal:  Negative for abdominal pain, blood in stool, diarrhea and rectal pain.  Genitourinary:  Negative for dysuria, frequency and menstrual problem.  Musculoskeletal:  Positive for arthralgias. Negative for back pain and neck pain.  Skin:  Negative for rash.  Neurological:  Negative for dizziness, speech difficulty, weakness and light-headedness.  Hematological:  Negative for adenopathy.  Psychiatric/Behavioral:  Negative for behavioral problems, dysphoric mood, self-injury and suicidal ideas.     Past Medical History:  Diagnosis Date   Anemia    low iron   Anxiety    Arthritis    Bipolar disorder (HCC)    Cancer (HCC) 1973   COPD (chronic obstructive pulmonary disease) (HCC)    Family history of adverse reaction to anesthesia    mom was put to sleep and she never woke up -    GERD (gastroesophageal reflux disease)    Guillain Barr syndrome    numbness in toes, legs hurt   Headache    migraines as a teenager   IBS (irritable bowel syndrome)    Lump, breast    removed years ago per Pt.   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, has quiet' seizures   Thyroid  disease      Social History   Socioeconomic History   Marital status: Widowed    Spouse name: Not on file   Number of children: Not on file   Years of education: Not on file   Highest education level: Some college, no degree  Occupational History   Not on file  Tobacco Use   Smoking  status: Former    Current packs/day: 0.00    Average packs/day: 0.3 packs/day for 6.0 years (1.5 ttl pk-yrs)    Types: Cigarettes    Start date: 10/13/2001    Quit date: 10/14/2007    Years since quitting: 17.1   Smokeless tobacco: Never  Vaping Use   Vaping status: Never Used  Substance and Sexual Activity   Alcohol use: No   Drug use: No   Sexual activity: Not on file  Other Topics Concern   Not on file  Social History Narrative   Not on file   Social Drivers of Health   Tobacco Use: Medium Risk (12/10/2024)   Patient History    Smoking Tobacco Use: Former    Smokeless Tobacco Use: Never    Passive Exposure: Not on Actuary Strain: Low Risk (06/19/2024)   Overall Financial Resource Strain (CARDIA)    Difficulty of Paying Living Expenses: Not hard at all  Food Insecurity: No Food Insecurity (06/19/2024)   Epic    Worried About Radiation Protection Practitioner of Food in the Last Year: Never true    Ran Out of Food in the Last Year: Never true  Transportation Needs: No Transportation Needs (06/19/2024)   Epic    Lack of Transportation (Medical): No  Lack of Transportation (Non-Medical): No  Physical Activity: Inactive (06/19/2024)   Exercise Vital Sign    Days of Exercise per Week: 0 days    Minutes of Exercise per Session: 0 min  Stress: Stress Concern Present (06/19/2024)   [employer] of Occupational Health - Occupational Stress Questionnaire    Feeling of Stress: Rather much  Social Connections: Socially Isolated (06/19/2024)   Social Connection and Isolation Panel    Frequency of Communication with Friends and Family: More than three times a week    Frequency of Social Gatherings with Friends and Family: Once a week    Attends Religious Services: Never    Database Administrator or Organizations: No    Attends Banker Meetings: Never    Marital Status: Widowed  Intimate Partner Violence: Not At Risk (06/19/2024)   Epic    Fear of Current or Ex-Partner:  No    Emotionally Abused: No    Physically Abused: No    Sexually Abused: No  Depression (PHQ2-9): Medium Risk (06/19/2024)   Depression (PHQ2-9)    PHQ-2 Score: 8  Alcohol Screen: Low Risk (06/19/2024)   Alcohol Screen    Last Alcohol Screening Score (AUDIT): 0  Housing: Unknown (06/19/2024)   Epic    Unable to Pay for Housing in the Last Year: No    Number of Times Moved in the Last Year: Not on file    Homeless in the Last Year: No  Utilities: Not At Risk (06/19/2024)   Epic    Threatened with loss of utilities: No  Health Literacy: Adequate Health Literacy (06/19/2024)   B1300 Health Literacy    Frequency of need for help with medical instructions: Never    Past Surgical History:  Procedure Laterality Date   BREAST SURGERY     COLONOSCOPY     EYE SURGERY Bilateral    cataract surgery with lens implants   MANDIBLE RECONSTRUCTION     MINOR REMOVAL OF MANDIBULAR HARDWARE Right 10/15/2015   Procedure: MINOR REMOVAL OF Right MANDIBULAR HARDWARE;  Surgeon: Glendia Primrose, DDS;  Location: [facility] OR;  Service: Oral Surgery;  Laterality: Right;   TONSILLECTOMY      Family History  Problem Relation Age of Onset   Cancer Father    Emphysema Sister     Allergies[1]  Medications Ordered Prior to Encounter[2]  BP 114/76 (BP Location: Right Arm, Patient Position: Sitting, Cuff Size: Normal)   Pulse (!) 56   Temp 98.4 F (36.9 C) (Oral)   Resp 16   Ht 5' 3.5 (1.613 m)   Wt 165 lb 6.4 oz (75 kg)   SpO2 97%   BMI 28.84 kg/m        Objective:   Physical Exam  General- No acute distress. Pleasant patient. Neck- Full range of motion, no jvd Lungs- even and unlabored. But shallow. Heart- regular rate and rhythm. Neurologic- CNII- XII grossly intact.  Lower ext- calfs symmetric, negative homans signs but anterior tibial area faint tender as has been in past.(Prior us  negative for dvt)      Assessment & Plan:   Stage 3 severe chronic obstructive pulmonary disease  (COPD) Stage 3 COPD with ongoing management needs. Coordination with pulmonologist for oxygen  concentrator and supplies is required. - Coordinated with pulmonologist for oxygen  concentrator and supplies. - Instructed to notify if coordination issues arise. -continue current inhalers and )2. -rx medrol  taper to use if needed for wheezing excacerbation  Chronic musculoskeletal pain syndromes (left calf pain,  left-sided sciatica, right knee pain, arthralgia) Chronic musculoskeletal pain with left calf pain, left-sided sciatica, right knee pain, and arthralgia. Previous lower ext  ultrasounds negative for dvt but known baker cyst left side. Pain management with ibuprofen  is planned, with kidney function monitoring due to potential nephrotoxicity. - Refilled ibuprofen  600 mg with instructions to use sparingly. - Checked kidney function. - Instructed to report major swelling or asymmetry in left calf for repeat ultrasound.  Hypertension Well-controlled with losartan . - Continue losartan  with current refills.  Gastroesophageal reflux disease (GERD) GERD managed with Protonix . - Continue Protonix  with current refills.  Vitamin D  deficiency Recent supplementation. Vitamin D  level needs assessment to determine further supplementation needs. - Checked vitamin D  level.  Fatigue under evaluation Fatigue under evaluation. Potential causes include vitamin D  deficiency, anemia, thyroid  dysfunction, and vitamin B12 deficiency. - Checked CBC, iron level, thyroid  studies, B12, B1, and iron panel.  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 Healing with scarring and hyperpigmentation. Possible chronic inflammation due to pressure or irritation.  General health maintenance Discussion of lip care due to cracking and bleeding, possibly related to prolonged oxygen  use. - Recommended Chapstick with vitamin E for lip care.  Follow up  date to be determined after lab review   Dallas Maxwell, PA-C    I personally spent a total of 43 minutes in the care of the patient today including performing a medically appropriate exam/evaluation, counseling and educating, placing orders, and documenting clinical information in the EHR.     [1]  Allergies Allergen Reactions   Influenza Vaccines    Aspirin     Other Reaction(s): GI Intolerance  [2]  Current Outpatient Medications on File Prior to Visit  Medication Sig Dispense Refill   albuterol  (PROVENTIL ) (2.5 MG/3ML) 0.083% nebulizer solution Take 3 mLs (2.5 mg total) by nebulization every 6 (six) hours as needed for wheezing. 180 mL 2   albuterol  (VENTOLIN  HFA) 108 (90 Base) MCG/ACT inhaler Inhale 2 puffs into the lungs every 6 (six) hours as needed for wheezing. 18 g 11   alclomethasone (ACLOVATE) 0.05 % cream Apply topically 2 (two) times daily as needed.     alendronate (FOSAMAX) 70 MG tablet Take 70 mg by mouth every Sunday. Take with a full glass of water on an empty stomach.     ARIPiprazole (ABILIFY) 15 MG tablet Take 15 mg by mouth daily.     azithromycin  (ZITHROMAX  Z-PAK) 250 MG tablet Take 2 tablets day 1 and then 1 daily for 4 days 6 each 0   B Complex Vitamins (VITAMIN B COMPLEX ) TABS Take 1 tablet by mouth daily. 30 tablet 5   benzonatate  (TESSALON ) 100 MG capsule TAKE ONE CAPSULE BY MOUTH THREE TIMES DAILY AS NEEDED FOR COUGH 30 capsule 2   benztropine (COGENTIN) 0.5 MG tablet Take 0.5 mg by mouth 2 (two) times daily.     Bepotastine  Besilate (BEPREVE) 1.5 % SOLN Place 1 drop into both eyes 2 (two) times daily as needed. 10 mL 5   Bepotastine  Besilate 1.5 % SOLN Apply to eye.     cetirizine  (ZYRTEC ) 10 MG tablet Take by mouth.     clonazePAM (KLONOPIN) 0.5 MG tablet Take by mouth.     cromolyn (OPTICROM) 4 % ophthalmic solution 1 drop 4 (four) times daily.     diclofenac  Sodium (VOLTAREN  ARTHRITIS PAIN) 1 % GEL Apply 4 g topically 4 (four) times daily. 150 g 3  diclofenac  Sodium (VOLTAREN ) 1 % GEL Apply 4 g topically 4 (four) times daily. 50 g 0   dicyclomine  (BENTYL ) 20 MG tablet Take 1 tablet (20 mg total) by mouth 3 (three) times daily as needed. 90 tablet 2   doxycycline  (VIBRA -TABS) 100 MG tablet Take 1 tablet (100 mg total) by mouth 2 (two) times daily. 14 tablet 0   famciclovir  (FAMVIR ) 250 MG tablet Take 1 tablet (250 mg total) by mouth 2 (two) times daily. 180 tablet 2   Fluticasone-Umeclidin-Vilant (TRELEGY ELLIPTA ) 200-62.5-25 MCG/ACT AEPB Inhale 1 puff into the lungs daily. 180 each 2   hydrOXYzine  (ATARAX ) 10 MG tablet Take 1-2 tablets (10-20 mg total) by mouth at bedtime as needed for anxiety (insomnia). 180 tablet 0   ibuprofen  (ADVIL ) 200 MG tablet 1-4 tab po every 8 hours prn moderate pain 60 tablet 2   ibuprofen  (ADVIL ) 600 MG tablet TAKE ONE TABLET BY MOUTH TWICE DAILY AS NEEDED FOR knee pain. 30 tablet 0   ipratropium-albuterol  (DUONEB) 0.5-2.5 (3) MG/3ML SOLN Take 3 mLs by nebulization every 4 (four) hours as needed. 360 mL 2   levocetirizine (XYZAL ) 5 MG tablet Take 1 tablet (5 mg total) by mouth every evening. 90 tablet 2   levocetirizine (XYZAL ) 5 MG tablet Take 1 tablet (5 mg total) by mouth every evening. 30 tablet 3   levocetirizine (XYZAL ) 5 MG tablet Take 1 tablet (5 mg total) by mouth every evening. 30 tablet 3   loperamide  (IMODIUM ) 2 MG capsule TAKE ONE CAPSULE BY MOUTH EVERY 6 TO 8 HOURS AS NEEDED FOR DIARRHEA. 30 capsule 5   losartan  (COZAAR ) 50 MG tablet Take 1 tablet (50 mg total) by mouth daily. 90 tablet 3   magnesium oxide (MAG-OX) 400 MG tablet Take 250 mg by mouth daily.     methimazole  (TAPAZOLE ) 5 MG tablet Take 1 tablet (5 mg total) by mouth daily. 90 tablet 0   montelukast  (SINGULAIR ) 10 MG tablet Take 1 tablet (10 mg total) by mouth at bedtime. 90 tablet 3   mupirocin  ointment (BACTROBAN ) 2 % Apply 1 Application topically 2 (two) times daily. 22 g 0   Nebulizers (PARI LC PLUS NEBULIZER) MISC Use with nebulized  medications as directed. Replace tubing every 6 months. 1 each 1   NONFORMULARY OR COMPOUNDED ITEM Elevated toilet seat 2 each 0   OHTUVAYRE  3 MG/2.5ML SUSP Inhale one vial by nebulizer twice daily. Shake well before use. Do not mix with other nebulized medications. 150 mL 11   olopatadine  (PATANOL) 0.1 % ophthalmic solution Place 1 drop into both eyes 2 (two) times daily. 5 mL 3   olopatadine  (PATANOL) 0.1 % ophthalmic solution Place 1 drop into both eyes 2 (two) times daily. 5 mL 12   oxcarbazepine (TRILEPTAL) 600 MG tablet Take 600 mg by mouth 2 (two) times daily.     oxyCODONE  (ROXICODONE ) 5 MG immediate release tablet Take 1 tablet (5 mg total) by mouth 3 (three) times daily as needed for moderate pain (pain score 4-6). 80 tablet 0   pantoprazole  (PROTONIX ) 40 MG tablet Take 1 tablet (40 mg total) by mouth daily. 90 tablet 3   predniSONE  (DELTASONE ) 10 MG tablet Take 1 tablet (10 mg total) by mouth daily with breakfast. 30 tablet 0   predniSONE  (DELTASONE ) 10 MG tablet 3 tab po day 1, 2 tab po day 2 and 1 tab po day 3 6 tablet 0   sodium chloride  HYPERTONIC 3 % nebulizer solution 3 ml via neb  twice daily 750 mL 12   traZODone (DESYREL) 50 MG tablet Take 50 mg by mouth at bedtime.     triamcinolone  cream (KENALOG ) 0.1 % Apply topically 2 (two) times daily. 30 g 2   triamcinolone  cream (KENALOG ) 0.1 % APPLY TOPICALLY TWICE DAILY AS NEEDED 30 g 3   Vitamin D , Ergocalciferol , (DRISDOL ) 1.25 MG (50000 UNIT) CAPS capsule 1 capsule per month 3 capsule 0   Vitamin D , Ergocalciferol , (DRISDOL ) 1.25 MG (50000 UNIT) CAPS capsule TAKE ONE CAPSULE BY MOUTH ONCE WEEKLY FOR SIX WEEKS 6 capsule 0   gabapentin  (NEURONTIN ) 400 MG capsule Take 3 capsules (1,200 mg total) by mouth 3 (three) times daily. 270 capsule 2   potassium chloride  SA (KLOR-CON  M) 20 MEQ tablet Take 1 tablet (20 mEq total) by mouth 2 (two) times daily for 7 days. 14 tablet 0   No current facility-administered medications on file prior to  visit.   "

## 2024-12-10 NOTE — Telephone Encounter (Signed)
 Patient has new insurance and it is not covering her oxygen . She needs for it to be changed to [facility] so they can cover her oxygen  and concentrator. The other company is coming to get her oxygen  today so she needs this to be done soon. A script to be sent to [facility] phone [phone] fax [phone]. She can be reached at [phone]. Her new insurance has been updated on file([employer]).

## 2024-12-10 NOTE — Telephone Encounter (Signed)
 I am seeing mutual patient who is receiving 0xygen and has new insurance. She states her 02 concentrator not covered under new insurance and she needs new tanks. She has seen you both and was wondering if you or nurse. Pt with [facility] now but she was told she need to transfer all scripts for her associated supplie to [facility]. Pt use [facility] [phone] fax number [phone]

## 2024-12-10 NOTE — Patient Instructions (Signed)
 Stage 3 severe chronic obstructive pulmonary disease (COPD) Stage 3 COPD with ongoing management needs. Coordination with pulmonologist for oxygen  concentrator and supplies is required. - Coordinated with pulmonologist for oxygen  concentrator and supplies. - Instructed to notify if coordination issues arise. -continue current inhalers and )2. -rx medrol  taper to use if needed for wheezing excacerbation  Chronic musculoskeletal pain syndromes (left calf pain, left-sided sciatica, right knee pain, arthralgia) Chronic musculoskeletal pain with left calf pain, left-sided sciatica, right knee pain, and arthralgia. Previous lower ext  ultrasounds negative for dvt but known baker cyst left side. Pain management with ibuprofen  is planned, with kidney function monitoring due to potential nephrotoxicity. - Refilled ibuprofen  600 mg with instructions to use sparingly. - Checked kidney function. - Instructed to report major swelling or asymmetry in left calf for repeat ultrasound.  Hypertension Well-controlled with losartan . - Continue losartan  with current refills.  Gastroesophageal reflux disease (GERD) GERD managed with Protonix . - Continue Protonix  with current refills.  Vitamin D  deficiency Recent supplementation. Vitamin D  level needs assessment to determine further supplementation needs. - Checked vitamin D  level.  Fatigue under evaluation Fatigue under evaluation. Potential causes include vitamin D  deficiency, anemia, thyroid  dysfunction, and vitamin B12 deficiency. - Checked CBC, iron level, thyroid  studies, B12, B1, and iron panel.  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 Healing with scarring and hyperpigmentation. Possible chronic inflammation due to pressure or irritation.  General health maintenance Discussion of lip care due to cracking and bleeding, possibly related to prolonged oxygen  use. - Recommended  Chapstick with vitamin E for lip care.  Follow up date to be determined after lab review

## 2024-12-11 ENCOUNTER — Telehealth: Payer: Self-pay

## 2024-12-11 LAB — COMPREHENSIVE METABOLIC PANEL WITH GFR
ALT: 9 U/L (ref 3–35)
AST: 15 U/L (ref 5–37)
Albumin: 4.2 g/dL (ref 3.5–5.2)
Alkaline Phosphatase: 60 U/L (ref 39–117)
BUN: 17 mg/dL (ref 6–23)
CO2: 30 meq/L (ref 19–32)
Calcium: 8.8 mg/dL (ref 8.4–10.5)
Chloride: 104 meq/L (ref 96–112)
Creatinine, Ser: 0.62 mg/dL (ref 0.40–1.20)
GFR: 88.51 mL/min
Glucose, Bld: 87 mg/dL (ref 70–99)
Potassium: 3.7 meq/L (ref 3.5–5.1)
Sodium: 141 meq/L (ref 135–145)
Total Bilirubin: 0.4 mg/dL (ref 0.2–1.2)
Total Protein: 7.2 g/dL (ref 6.0–8.3)

## 2024-12-11 LAB — CBC WITH DIFFERENTIAL/PLATELET
Basophils Absolute: 0.1 K/uL (ref 0.0–0.1)
Basophils Relative: 1.7 % (ref 0.0–3.0)
Eosinophils Absolute: 0.1 K/uL (ref 0.0–0.7)
Eosinophils Relative: 1.8 % (ref 0.0–5.0)
HCT: 41.3 % (ref 36.0–46.0)
Hemoglobin: 13.8 g/dL (ref 12.0–15.0)
Lymphocytes Relative: 38.5 % (ref 12.0–46.0)
Lymphs Abs: 2.2 K/uL (ref 0.7–4.0)
MCHC: 33.4 g/dL (ref 30.0–36.0)
MCV: 97.4 fl (ref 78.0–100.0)
Monocytes Absolute: 0.5 K/uL (ref 0.1–1.0)
Monocytes Relative: 8.4 % (ref 3.0–12.0)
Neutro Abs: 2.8 K/uL (ref 1.4–7.7)
Neutrophils Relative %: 49.6 % (ref 43.0–77.0)
Platelets: 302 K/uL (ref 150.0–400.0)
RBC: 4.24 Mil/uL (ref 3.87–5.11)
RDW: 14.7 % (ref 11.5–15.5)
WBC: 5.7 K/uL (ref 4.0–10.5)

## 2024-12-11 LAB — TSH: TSH: 0.61 u[IU]/mL (ref 0.35–5.50)

## 2024-12-11 LAB — VITAMIN B12: Vitamin B-12: 413 pg/mL (ref 211–911)

## 2024-12-11 LAB — T4, FREE: Free T4: 0.68 ng/dL (ref 0.60–1.60)

## 2024-12-11 LAB — VITAMIN D 25 HYDROXY (VIT D DEFICIENCY, FRACTURES): VITD: 63.37 ng/mL (ref 30.00–100.00)

## 2024-12-11 NOTE — Telephone Encounter (Signed)
 I called the number listed, however it was not a correct number and there were too many numbers in the phone number.  I called [phone], the first agent I reached did not respond when I was speaking, there was noise in the background, so I hung up after multiple attempts to get a response from the agent. I called back and spoke with Norleen at [facility] I explained that the patient had not had a walk within the past 30 days, so she is scheduled for a walk test and OV on 12/23/24.  He stated he would make a note in the record.  Nothing further needed.

## 2024-12-11 NOTE — Telephone Encounter (Signed)
 Hey there needs to be a new DME order for the oxygen  cause the last order was in Sept 2025 for a neb

## 2024-12-11 NOTE — Telephone Encounter (Signed)
 Called pt and notified her that he labs have not been resulted yet but when they do I will giver her a call since she advised me her rhona is not workinhg    Copied from CRM [id number]. Topic: Clinical - Lab/Test Results >> Dec 11, 2024  4:19 PM Viola F wrote: Reason for CRM: Patient requesting call back with lab results. Please call her at [phone]

## 2024-12-11 NOTE — Telephone Encounter (Signed)
 LOV 11/18/24, last qualifying walk 11/07/2022, she will need an OV with qualifying walk in order to send a new order to [facility] for oxygen .  Patient does not have a f/u until 01/20/2025.  I called and spoke with patient, she has someone that comes to her home one time per week and takes her to MD appointments.  I scheduled her to see Landry Ferrari NP on 12/23/24 at 1 pm, advised to arrive ty 12:45 pm for check in.  She verbalized understanding.  Nothing further needed.

## 2024-12-11 NOTE — Telephone Encounter (Signed)
 Hello  I did not follow this patient anymore and am forwarding the message to her current team.  Thanks

## 2024-12-12 ENCOUNTER — Other Ambulatory Visit: Payer: Self-pay | Admitting: Licensed Clinical Social Worker

## 2024-12-12 NOTE — Telephone Encounter (Signed)
 We needs to submit a new oxygen  order to [facility] due to insurance change and transfer of care [facility] phone number in [phone]

## 2024-12-12 NOTE — Telephone Encounter (Signed)
 This has already been addressed on 1/8.  Patient has upcomming appointment for qualifying walk and [facility] had been made aware of need for walk and OV.  Nothing further needed.

## 2024-12-13 ENCOUNTER — Telehealth: Payer: Self-pay | Admitting: Medical

## 2024-12-13 ENCOUNTER — Ambulatory Visit: Payer: Self-pay | Admitting: Medical

## 2024-12-13 NOTE — Telephone Encounter (Signed)
 Opened to review

## 2024-12-13 NOTE — Telephone Encounter (Signed)
 Pt has hydroxyzine  and xyzal  on her med list. Pharmacy/insurance sent over information. Call pt and talk with her or one of her assistants. Remind us  xyzal  for daily allergies. Can use hydroxyzine  occasionally at night for insomnia or anxiety. Not to use both xyzal  and hydroxyzine  at same time. Xyzal  is recommened to use at night so decide to one or the other but not both.

## 2024-12-14 LAB — IRON,TIBC AND FERRITIN PANEL
%SAT: 25 % (ref 16–45)
Ferritin: 135 ng/mL (ref 16–288)
Iron: 61 ug/dL (ref 45–160)
TIBC: 244 ug/dL — ABNORMAL LOW (ref 250–450)

## 2024-12-14 LAB — VITAMIN B1: Vitamin B1 (Thiamine): 19 nmol/L (ref 8–30)

## 2024-12-15 ENCOUNTER — Other Ambulatory Visit: Payer: Self-pay | Admitting: Medical

## 2024-12-15 NOTE — Telephone Encounter (Signed)
 Called pt and notified her of pcp advice she says that she rarely takes the hydroxiyzine but does understand not to take them together

## 2024-12-16 ENCOUNTER — Ambulatory Visit (INDEPENDENT_AMBULATORY_CARE_PROVIDER_SITE_OTHER)

## 2024-12-16 VITALS — BP 112/84 | Ht 63.5 in | Wt 165.0 lb

## 2024-12-16 DIAGNOSIS — M25561 Pain in right knee: Secondary | ICD-10-CM | POA: Diagnosis not present

## 2024-12-16 DIAGNOSIS — G8929 Other chronic pain: Secondary | ICD-10-CM

## 2024-12-16 DIAGNOSIS — M1711 Unilateral primary osteoarthritis, right knee: Secondary | ICD-10-CM | POA: Diagnosis not present

## 2024-12-16 DIAGNOSIS — M25532 Pain in left wrist: Secondary | ICD-10-CM

## 2024-12-16 DIAGNOSIS — M25531 Pain in right wrist: Secondary | ICD-10-CM | POA: Diagnosis not present

## 2024-12-16 NOTE — Patient Instructions (Signed)
 Visit Information  Thank you for taking time to visit with me today. Please don't hesitate to contact me if I can be of assistance to you before our next scheduled appointment.  Your next care management appointment is by telephone on 02/13 at 2 PM  Please call the care guide team at [phone] if you need to cancel, schedule, or reschedule an appointment.   Please call the Suicide and Crisis Lifeline: 988 go to [facility] Urgent [facility] [address] [place] [phone]) call 911 if you are experiencing a Mental Health or Behavioral Health Crisis or need someone to talk to.  Rolin Kerns, LCSW [place]  [facility], [facility] Clinical Social Worker Direct Dial: [phone]  Fax: [phone] Website: [email or web address] 10:21 AM

## 2024-12-16 NOTE — Progress Notes (Signed)
" ° °  Subjective:    Patient ID: Jill Spence, female    DOB: 74 y.o., 02-22-1951   MRN: [id number]  Chief Complaint: Bilateral knee and hand pain  Discussed the use of AI scribe software for clinical note transcription with the patient, who gave verbal consent to proceed.  History of Present Illness Jill Spence is a 74 year old female with chondrocalcinosis and osteoarthritis who presents for follow-up of worsening bilateral knee and hand pain.  Knee Pain and Functional Limitation: - Chronic bilateral knee pain with recent worsening - Pain limits mobility; rarely leaves her house - Mobility further restricted by dyspnea in extreme temperatures - Utilizes a knee brace - Prior physical therapy with home exercises and home health PT - Currently working with a new therapy agency addressing more than just lower extremities  Hand Pain, Stiffness, and Neuropathy: - Persistent bilateral hand pain with frequent cramping and stiffness - Fingers cramp and lock in flexion, requiring manual extension - Intermittent dorsal hand pain - Knuckles are particularly painful - Numbness and tingling in all fingers, radiating from the wrist - No use of hand-specific treatments, braces, or splints - Previously informed of nerve damage and possible low oxygen  affecting fingers  Muscle Cramping and Restless Legs: - Muscle cramps in toes and fingers - Restless legs - History of low magnesium and potassium - Increasing water intake with associated increased urination  Objective:   Vitals:   12/16/24 1400  BP: 112/84   Bilateral hands/wrists: Nontender over the dorsal and volar radiocarpal joints.  Nontender over the MCPs.  Nontender over the carpal joints.  Nontender over the PIPs, and DIPs. Negative Finkelstein's, Eickhoff's.   Positive Tinel's, Durkan/Phalen's.     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 Currently, she is working with a new therapy agency that addresses more than just lower extremity involvement. A specific knee brace has been prescribed, and documentation was faxed to [facility] for fitting. She was instructed to schedule a fitting appointment at [facility].  Carpal tunnel syndrome and osteoarthritis of the hands She reports bilateral hand pain, cramping, numbness, and tingling in most fingers, consistent with osteoarthritis and carpal tunnel syndrome. She is not currently using therapy. Education on nighttime wrist brace use for carpal tunnel syndrome was provided, and an in-clinic fitting of wrist braces was arranged. She was advised to use wrist braces only at night.  Patient forts she is more active at night than during the day so would prefer to wear during the day which I believe is acceptable.  Corticosteroid injection was discussed as a future option if symptoms persist though the patient is highly resistant to this idea.   "

## 2024-12-16 NOTE — Patient Outreach (Signed)
 Complex Care Management   Visit Note  12/12/2024  Name:  Jill Spence MRN: [id number] DOB: [birth year]  Situation: Referral received for Complex Care Management related to Stress I obtained verbal consent from Patient.  Visit completed with Patient  on the phone  Background:   Past Medical History:  Diagnosis Date   Anemia    low iron   Anxiety    Arthritis    Bipolar disorder (HCC)    Cancer (HCC) 1973   COPD (chronic obstructive pulmonary disease) (HCC)    Family history of adverse reaction to anesthesia    mom was put to sleep and she never woke up -    GERD (gastroesophageal reflux disease)    Guillain Barr syndrome    numbness in toes, legs hurt   Headache    migraines as a teenager   IBS (irritable bowel syndrome)    Lump, breast    removed years ago per Pt.   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, has quiet' seizures   Thyroid  disease     Assessment: Patient Reported Symptoms:  Cognitive Cognitive Status: No symptoms reported, Alert and oriented to person, place, and time, Normal speech and language skills Cognitive/Intellectual Conditions Management [RPT]: None reported or documented in medical history or problem list   Health Maintenance Behaviors: Annual physical exam  Neurological Neurological Review of Symptoms: Not assessed    HEENT HEENT Symptoms Reported: Not assessed      Cardiovascular Cardiovascular Symptoms Reported: Not assessed    Respiratory Respiratory Symptoms Reported: Productive cough, Shortness of breath Additional Respiratory Details: Has upcoming appt with Pulmonology Respiratory Management Strategies: Coping strategies, Adequate rest, Medication therapy  Endocrine Endocrine Symptoms Reported: Not assessed    Gastrointestinal Gastrointestinal Symptoms Reported: Not assessed      Genitourinary Genitourinary Symptoms Reported: Not assessed     Integumentary Integumentary Symptoms Reported: No symptoms reported Skin Management Strategies: Routine screening Skin Comment: Pt reports rash symptoms have resolved  Musculoskeletal Musculoskelatal Symptoms Reviewed: Weakness Musculoskeletal Management Strategies: Coping strategies, Routine screening, Exercise Musculoskeletal Comment: Patient continues to participate in [facility] PT      Psychosocial Psychosocial Symptoms Reported: No symptoms reported Behavioral Management Strategies: Adequate rest, Coping strategies, Medication therapy, Counseling Major Change/Loss/Stressor/Fears (CP): Medical condition, self Techniques to Cope with Loss/Stress/Change: Diversional activities      12/16/2024    PHQ2-9 Depression Screening   Little interest or pleasure in doing things    Feeling down, depressed, or hopeless    PHQ-2 - Total Score    Trouble falling or staying asleep, or sleeping too much    Feeling tired or having little energy    Poor appetite or overeating     Feeling bad about yourself - or that you are a failure or have let yourself or your family down    Trouble concentrating on things, such as reading the newspaper or watching television    Moving or speaking so slowly that other people could have noticed.  Or the opposite - being so fidgety or restless that you have been moving around a lot more than usual    Thoughts that you would be better off dead, or hurting yourself in some way    PHQ2-9 Total Score    If you checked off any problems, how difficult have these problems made it for you to do your work, take care  of things at home, or get along with other people    Depression Interventions/Treatment      There were no vitals filed for this visit.    Medications Reviewed Today     Reviewed by Trinidee Schrag D, LCSW (Social Worker) on 12/16/24 at 1012  Med List Status: <None>   Medication Order Taking? Sig Documenting Provider Last Dose Status Informant  albuterol   (PROVENTIL ) (2.5 MG/3ML) 0.083% nebulizer solution [id number] Yes Take 3 mLs (2.5 mg total) by nebulization every 6 (six) hours as needed for wheezing. Meade Verdon RAMAN, MD  Active   albuterol  (VENTOLIN  HFA) [phone] Base) MCG/ACT inhaler [id number] Yes Inhale 2 puffs into the lungs every 6 (six) hours as needed for wheezing. Desai, Nikita S, MD  Active   alclomethasone (ACLOVATE) 0.05 % cream [id number] Yes Apply topically 2 (two) times daily as needed. [provider]  Active   alendronate (FOSAMAX) 70 MG tablet [id number] Yes Take 70 mg by mouth every Sunday. Take with a full glass of water on an empty stomach. [provider]  Active Self  ARIPiprazole (ABILIFY) 15 MG tablet [id number] Yes Take 15 mg by mouth daily. [provider]  Active Self  azithromycin  (ZITHROMAX  Z-PAK) 250 MG tablet [id number] Yes Take 2 tablets day 1 and then 1 daily for 4 days Olalere, Adewale A, MD  Active   B Complex Vitamins (VITAMIN B COMPLEX ) TABS [id number] Yes Take 1 tablet by mouth daily. Mannam, Praveen, MD  Active   benzonatate  (TESSALON ) 100 MG capsule [id number] Yes TAKE ONE CAPSULE BY MOUTH THREE TIMES DAILY AS NEEDED FOR COUGH Saguier, [place], PA-C  Active   benztropine (COGENTIN) 0.5 MG tablet [id number] Yes Take 0.5 mg by mouth 2 (two) times daily. [provider]  Active Self  Bepotastine  Besilate (BEPREVE) 1.5 % SOLN [id number] Yes Place 1 drop into both eyes 2 (two) times daily as needed. Saguier, [place], PA-C  Active   Bepotastine  Besilate 1.5 % SOLN [id number] Yes Apply to eye. [provider]  Active   cetirizine  (ZYRTEC ) 10 MG tablet [id number] Yes Take by mouth. [provider]  Active   clonazePAM (KLONOPIN) 0.5 MG tablet [id number] Yes Take by mouth. [provider]  Active   cromolyn (OPTICROM) 4 % ophthalmic solution [id number] Yes 1 drop 4 (four) times daily. [provider]  Active   diclofenac  Sodium (VOLTAREN  ARTHRITIS PAIN) 1 % GEL  [id number] Yes Apply 4 g topically 4 (four) times daily. Saguier, [place], PA-C  Active   diclofenac  Sodium (VOLTAREN ) 1 % GEL [id number] Yes Apply 4 g topically 4 (four) times daily. Saguier, [place], PA-C  Active   dicyclomine  (BENTYL ) 20 MG tablet [id number] Yes Take 1 tablet (20 mg total) by mouth 3 (three) times daily as needed. Saguier, [place], PA-C  Active   doxycycline  (VIBRA -TABS) 100 MG tablet [id number] Yes Take 1 tablet (100 mg total) by mouth 2 (two) times daily. Saguier, [place], PA-C  Active   famciclovir  (FAMVIR ) 250 MG tablet [id number] Yes Take 1 tablet (250 mg total) by mouth 2 (two) times daily. Saguier, Edward, PA-C  Active   Fluticasone-Umeclidin-Vilant (TRELEGY ELLIPTA ) 200-62.5-25 MCG/ACT AEPB [id number] Yes Inhale 1 puff into the lungs daily. Saguier, [place], PA-C  Active   gabapentin  (NEURONTIN ) 400 MG capsule [id number]  Take 3 capsules (1,200 mg total) by mouth 3 (three) times daily. Saguier, Edward, PA-C  Expired 11/18/24 2359   hydrOXYzine  (ATARAX ) 10 MG tablet [id number] Yes Take 1-2 tablets (10-20 mg total) by mouth  at bedtime as needed for anxiety (insomnia). Saguier, [place], PA-C  Active   ibuprofen  (ADVIL ) 200 MG tablet [id number] Yes 1-4 tab po every 8 hours prn moderate pain Saguier, Edward, PA-C  Active   ibuprofen  (ADVIL ) 600 MG tablet [id number] Yes TAKE ONE TABLET BY MOUTH TWICE DAILY AS NEEDED FOR knee pain. Saguier, [place], PA-C  Active   ibuprofen  (ADVIL ) 600 MG tablet [id number]  Take 1 tablet (600 mg total) by mouth every 8 (eight) hours as needed. Saguier, Dallas, PA-C  Active   ipratropium-albuterol  (DUONEB) 0.5-2.5 (3) MG/3ML SOLN [id number] Yes Take 3 mLs by nebulization every 4 (four) hours as needed. Saguier, [place], PA-C  Active   levocetirizine (XYZAL ) 5 MG tablet [id number] Yes Take 1 tablet (5 mg total) by mouth every evening. Saguier, Dallas, PA-C  Active   levocetirizine (XYZAL ) 5 MG tablet [id number] Yes Take 1 tablet (5 mg total) by mouth every evening.  Saguier, Dallas, PA-C  Active   levocetirizine (XYZAL ) 5 MG tablet [id number] Yes Take 1 tablet (5 mg total) by mouth every evening. Saguier, Dallas, PA-C  Active   loperamide  (IMODIUM ) 2 MG capsule [id number] Yes TAKE ONE CAPSULE BY MOUTH EVERY 6 TO 8 HOURS AS NEEDED FOR DIARRHEA. Saguier, Dallas, PA-C  Active   losartan  (COZAAR ) 50 MG tablet [id number] Yes Take 1 tablet (50 mg total) by mouth daily. Saguier, Dallas, PA-C  Active   magnesium oxide (MAG-OX) 400 MG tablet [id number] Yes Take 250 mg by mouth daily. [provider]  Active Self  methimazole  (TAPAZOLE ) 5 MG tablet [id number] Yes Take 1 tablet (5 mg total) by mouth daily. Saguier, Dallas, PA-C  Active   methylPREDNISolone  (MEDROL ) 4 MG tablet [id number]  Standard 6 day taper dose pack Saguier, Dallas, PA-C  Active   montelukast  (SINGULAIR ) 10 MG tablet [id number] Yes Take 1 tablet (10 mg total) by mouth at bedtime. Saguier, [place], PA-C  Active   mupirocin  ointment (BACTROBAN ) 2 % [id number] Yes Apply 1 Application topically 2 (two) times daily. Saguier, Dallas, PA-C  Active   Nebulizers (PARI LC PLUS NEBULIZER) MISC [id number] Yes Use with nebulized medications as directed. Replace tubing every 6 months. Mannam, Praveen, MD  Active   NONFORMULARY OR COMPOUNDED ITEM [id number] Yes Elevated toilet seat Saguier, Edward, PA-C  Active   OHTUVAYRE  3 MG/2.5ML SUSP [id number] Yes Inhale one vial by nebulizer twice daily. Shake well before use. Do not mix with other nebulized medications. Mannam, Praveen, MD  Active   olopatadine  (PATANOL) 0.1 % ophthalmic solution [id number] Yes Place 1 drop into both eyes 2 (two) times daily. Saguier, [place], PA-C  Active   olopatadine  (PATANOL) 0.1 % ophthalmic solution [id number] Yes Place 1 drop into both eyes 2 (two) times daily. Saguier, Dallas, PA-C  Active   oxcarbazepine (TRILEPTAL) 600 MG tablet [id number] Yes Take 600 mg by mouth 2 (two) times daily. [provider]  Active Self  oxyCODONE   (ROXICODONE ) 5 MG immediate release tablet [id number] Yes Take 1 tablet (5 mg total) by mouth 3 (three) times daily as needed for moderate pain (pain score 4-6). Debby Fidela CROME, NP  Active   pantoprazole  (PROTONIX ) 40 MG tablet [id number] Yes Take 1 tablet (40 mg total) by mouth daily. Saguier, [place], PA-C  Active   potassium chloride  SA (KLOR-CON  M) 20 MEQ tablet [id number]  Take 1 tablet (20 mEq total) by mouth 2 (two) times daily for 7 days. Saguier, Edward, PA-C  Expired 11/18/24 2359   predniSONE  (DELTASONE ) 10 MG tablet [id number] Yes Take  1 tablet (10 mg total) by mouth daily with breakfast. Hope Almarie ORN, NP  Active   predniSONE  (DELTASONE ) 10 MG tablet [id number] Yes 3 tab po day 1, 2 tab po day 2 and 1 tab po day 3 Saguier, Dallas, PA-C  Active   sodium chloride  HYPERTONIC 3 % nebulizer solution [id number] Yes 3 ml via neb twice daily Mannam, Praveen, MD  Active   traZODone (DESYREL) 50 MG tablet [id number] Yes Take 50 mg by mouth at bedtime. [provider]  Active   triamcinolone  cream (KENALOG ) 0.1 % [id number] Yes Apply topically 2 (two) times daily. Saguier, Dallas, PA-C  Active   triamcinolone  cream (KENALOG ) 0.1 % [id number] Yes APPLY TOPICALLY TWICE DAILY AS NEEDED Saguier, Dallas, PA-C  Active   Vitamin D , Ergocalciferol , (DRISDOL ) 1.25 MG (50000 UNIT) CAPS capsule [id number] Yes 1 capsule per month Saguier, Edward, PA-C  Active   Vitamin D , Ergocalciferol , (DRISDOL ) 1.25 MG (50000 UNIT) CAPS capsule [id number] Yes TAKE ONE CAPSULE BY MOUTH ONCE WEEKLY FOR SIX WEEKS Saguier, Dallas RIGGERS  Active   Med List Note Jill Spence 02/08/24 1350): Patient is asking for medication to be refilled today, patient is in pain.             Recommendation:   Continue Current Plan of Care  Follow Up Plan:   Telephone follow-up in 1 month  Rolin Kerns, LCSW [facility]  [facility], [facility] Clinical Social Worker Direct Dial: [phone]  Fax:  [phone] Website: [email or web address] 10:20 AM

## 2024-12-23 ENCOUNTER — Ambulatory Visit: Admitting: Primary Care

## 2024-12-23 ENCOUNTER — Encounter: Payer: Self-pay | Admitting: Primary Care

## 2024-12-23 ENCOUNTER — Telehealth: Payer: Self-pay

## 2024-12-23 VITALS — BP 118/74 | HR 70 | Temp 97.1°F | Ht 63.0 in | Wt 164.2 lb

## 2024-12-23 DIAGNOSIS — Z87891 Personal history of nicotine dependence: Secondary | ICD-10-CM

## 2024-12-23 DIAGNOSIS — J4489 Other specified chronic obstructive pulmonary disease: Secondary | ICD-10-CM

## 2024-12-23 DIAGNOSIS — J9611 Chronic respiratory failure with hypoxia: Secondary | ICD-10-CM

## 2024-12-23 DIAGNOSIS — J449 Chronic obstructive pulmonary disease, unspecified: Secondary | ICD-10-CM

## 2024-12-23 MED ORDER — PREDNISONE 5 MG PO TABS: 5.0000 mg | ORAL_TABLET | Freq: Every day | ORAL | 2 refills | Status: AC

## 2024-12-23 NOTE — Telephone Encounter (Signed)
 Will send as soon as orders are signed

## 2024-12-23 NOTE — Telephone Encounter (Signed)
 [facility], 2 urgent orders has been placed for pt to go to [facility]., Please make sure this is handled for pt ASAP as [facility] is trying to take her current order that is with them. Pt can not be with put oxygen .

## 2024-12-23 NOTE — Progress Notes (Signed)
 "  @Patient  ID: Jill Spence, female    DOB: [birth year], 74 y.o.   MRN: [id number]  No chief complaint on file.   Referring provider: Saguier, Edward, PA-C  HPI: 74 year old female, former smoker. PMH significant for chronic respiratory failure with hypoxia, stage 3 COPD, hyperthyroidism, vit D deficiency.   Previous [facility] pulmonary encounter: 11/28/24- Dr. Neda  Patient with chronic obstructive pulmonary disease In for follow-up  Discussed the use of AI scribe software for clinical note transcription with the patient, who gave verbal consent to proceed.  History of Present Illness Jill Spence is a 74 year old female with pulmonary fibrosis and emphysema who presents with increased fatigue and dyspnea.  She has been experiencing increased fatigue, particularly when walking from one room to another, and sometimes feels like she might pass out. Her activity level was previously normal but has declined recently. She finds daily activities, such as making her bed, more challenging due to fatigue.  She experiences difficulty breathing, especially when transitioning from heat to cold environments, and reports coughing up thick mucus. She has not participated in pulmonary rehabilitation recently but has done it in the past. She uses oxygen  more frequently and has a history of pulmonary fibrosis and emphysema, diagnosed 16 years ago. She quit smoking 30 years ago.  She has been on Trelegy for a couple of years but does not feel any change with it. She uses a nebulizer when feeling bad but avoids daily use because she does not want to become dependent, although her doctor has explained that there is no dependency risk with inhalers. She recently completed a course of doxycycline  and reports that her mucus is becoming thick and yellow again, causing concern. She is not allergic to antibiotics but experiences diarrhea with some, which she cannot recall by name.  She has a family history of lung  disease, with her sister having died from the same condition. She attributes some of her lung issues to [place] where she initially experienced breathing difficulties.  Shortness of breath with most activities Denies chest pains or chest discomfort  Does have a cough with sputum production  No fevers, no chills  Longstanding history of obstructive lung disease  12/23/2024 Discussed the use of AI scribe software for clinical note transcription with the patient, who gave verbal consent to proceed.  History of Present Illness Jill Spence is a 74 year old female with stage three COPD, chronic respiratory failure, chronic pulmonary fibrosis, and emphysema who presents for requalification for oxygen  therapy.  She requires requalification for oxygen  therapy due to a recent change in insurance to [employer], which necessitates a new order for her oxygen  supplies. She needs a concentrator, a large emergency tank for power outages, and portable 'C tanks' for mobility. She reports using oxygen  at home and experiences significant dyspnea with minimal exertion.  She experiences significant fatigue with walking, attributing it to her respiratory conditions. Her lung function has declined over the past three years, with FEV1 decreasing from 44% to 32% and diffusion capacity from 26% to 22%. She experiences increased congestion and cough, especially at night, which affects her sleep. She is taking Mucinex to help with mucus clearance.  She completed a Z-Pak in December with minimal improvement. She is currently on Trelegy 200 micrograms, one puff daily, and uses an albuterol  inhaler daily for emergencies. She also uses a nebulizer with Ohtuvayre  twice daily, which is  delivered from a specialty pharmacy. She has three boxes of Ohtuvayre  at home but reports issues with insurance covering the tubing costs.  She has started a new pulmonary and  physical rehab program last week. Jill Spence does not accept her insurance. She prefers virtual rehab due to concerns about exposure to illness in public settings. She works from home and prefers virtual rehabilitation due to these concerns.  She has a history of osteoporosis, previously treated with medication. She expresses concern about the side effects of long-term prednisone  use, including osteoporosis and drug-induced diabetes.  Imaging: 09/17/24 HRCT>> Multiple calcified and noncalcified scattered pulmonary nodules up to 7 mm, stable to prior. Some nodules are smaller to prior suggestive of waxing and waning infectious/inflammatory process. Recommend continued attention on follow-up to ensure stability. Moderate to severe centrilobular emphysematous changes.  Allergies[1]   There is no immunization history on file for this patient.  Past Medical History:  Diagnosis Date   Anemia    low iron   Anxiety    Arthritis    Bipolar disorder (HCC)    Cancer (HCC) 1973   COPD (chronic obstructive pulmonary disease) (HCC)    Family history of adverse reaction to anesthesia    mom was put to sleep and she never woke up -    GERD (gastroesophageal reflux disease)    Guillain Barr syndrome    numbness in toes, legs hurt   Headache    migraines as a teenager   IBS (irritable bowel syndrome)    Lump, breast    removed years ago per Pt.   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 180 mL 2   albuterol  (VENTOLIN  HFA) 108 (90 Base) MCG/ACT inhaler Inhale 2 puffs into the lungs every 6  (six) hours as needed for wheezing. 18 g 11   alclomethasone (ACLOVATE) 0.05 % cream Apply topically 2 (two) times daily as needed.     alendronate (FOSAMAX) 70 MG tablet Take 70 mg by mouth every Sunday. Take with a full glass of water on an empty stomach.     ARIPiprazole (ABILIFY) 15 MG tablet Take 15 mg by mouth daily.     azithromycin  (ZITHROMAX  Z-PAK) 250 MG tablet Take 2 tablets day 1 and then 1 daily for 4 days 6 each 0   B Complex Vitamins (VITAMIN B COMPLEX ) TABS Take 1 tablet by mouth daily. 30 tablet 5   benzonatate  (TESSALON ) 100 MG capsule TAKE ONE CAPSULE BY MOUTH THREE TIMES DAILY AS NEEDED FOR COUGH 30 capsule 2   benztropine (COGENTIN) 0.5 MG tablet Take 0.5 mg by mouth 2 (two) times daily.     Bepotastine  Besilate (BEPREVE) 1.5 % SOLN Place 1 drop into both eyes 2 (two) times daily as needed. 10 mL 5   Bepotastine  Besilate 1.5 % SOLN Apply to eye.     cetirizine  (ZYRTEC ) 10 MG tablet Take by mouth.     clonazePAM (KLONOPIN) 0.5 MG tablet Take by mouth.     cromolyn (OPTICROM) 4 % ophthalmic solution 1 drop 4 (four) times daily.  diclofenac  Sodium (VOLTAREN  ARTHRITIS PAIN) 1 % GEL Apply 4 g topically 4 (four) times daily. 150 g 3   diclofenac  Sodium (VOLTAREN ) 1 % GEL Apply 4 g topically 4 (four) times daily. 50 g 0   dicyclomine  (BENTYL ) 20 MG tablet Take 1 tablet (20 mg total) by mouth 3 (three) times daily as needed. 90 tablet 2   doxycycline  (VIBRA -TABS) 100 MG tablet Take 1 tablet (100 mg total) by mouth 2 (two) times daily. 14 tablet 0   famciclovir  (FAMVIR ) 250 MG tablet Take 1 tablet (250 mg total) by mouth 2 (two) times daily. 180 tablet 2   Fluticasone-Umeclidin-Vilant (TRELEGY ELLIPTA ) 200-62.5-25 MCG/ACT AEPB Inhale 1 puff into the lungs daily. 180 each 2   gabapentin  (NEURONTIN ) 400 MG capsule Take 3 capsules (1,200 mg total) by mouth 3 (three) times daily. 270 capsule 2   hydrOXYzine  (ATARAX ) 10 MG tablet Take 1-2 tablets (10-20 mg total) by mouth at bedtime as  needed for anxiety (insomnia). 180 tablet 0   ibuprofen  (ADVIL ) 200 MG tablet 1-4 tab po every 8 hours prn moderate pain 60 tablet 2   ibuprofen  (ADVIL ) 600 MG tablet TAKE ONE TABLET BY MOUTH TWICE DAILY AS NEEDED FOR knee pain. 30 tablet 0   ibuprofen  (ADVIL ) 600 MG tablet Take 1 tablet (600 mg total) by mouth every 8 (eight) hours as needed. 30 tablet 0   ipratropium-albuterol  (DUONEB) 0.5-2.5 (3) MG/3ML SOLN Take 3 mLs by nebulization every 4 (four) hours as needed. 360 mL 2   levocetirizine (XYZAL ) 5 MG tablet Take 1 tablet (5 mg total) by mouth every evening. 90 tablet 2   levocetirizine (XYZAL ) 5 MG tablet Take 1 tablet (5 mg total) by mouth every evening. 30 tablet 3   levocetirizine (XYZAL ) 5 MG tablet Take 1 tablet (5 mg total) by mouth every evening. 30 tablet 3   loperamide  (IMODIUM ) 2 MG capsule TAKE ONE CAPSULE BY MOUTH EVERY 6 TO 8 HOURS AS NEEDED FOR DIARRHEA. 30 capsule 5   losartan  (COZAAR ) 50 MG tablet Take 1 tablet (50 mg total) by mouth daily. 90 tablet 3   magnesium oxide (MAG-OX) 400 MG tablet Take 250 mg by mouth daily.     methimazole  (TAPAZOLE ) 5 MG tablet Take 1 tablet (5 mg total) by mouth daily. 90 tablet 0   methylPREDNISolone  (MEDROL ) 4 MG tablet Standard 6 day taper dose pack 21 tablet 0   montelukast  (SINGULAIR ) 10 MG tablet Take 1 tablet (10 mg total) by mouth at bedtime. 90 tablet 3   mupirocin  ointment (BACTROBAN ) 2 % Apply 1 Application topically 2 (two) times daily. 22 g 0   Nebulizers (PARI LC PLUS NEBULIZER) MISC Use with nebulized medications as directed. Replace tubing every 6 months. 1 each 1   NONFORMULARY OR COMPOUNDED ITEM Elevated toilet seat 2 each 0   OHTUVAYRE  3 MG/2.5ML SUSP Inhale one vial by nebulizer twice daily. Shake well before use. Do not mix with other nebulized medications. 150 mL 11   olopatadine  (PATANOL) 0.1 % ophthalmic solution Place 1 drop into both eyes 2 (two) times daily. 5 mL 3   olopatadine  (PATANOL) 0.1 % ophthalmic solution  Place 1 drop into both eyes 2 (two) times daily. 5 mL 12   oxcarbazepine (TRILEPTAL) 600 MG tablet Take 600 mg by mouth 2 (two) times daily.     oxyCODONE  (ROXICODONE ) 5 MG immediate release tablet Take 1 tablet (5 mg total) by mouth 3 (three) times daily as needed for moderate pain (pain  score 4-6). 80 tablet 0   pantoprazole  (PROTONIX ) 40 MG tablet Take 1 tablet (40 mg total) by mouth daily. 90 tablet 3   potassium chloride  SA (KLOR-CON  M) 20 MEQ tablet Take 1 tablet (20 mEq total) by mouth 2 (two) times daily for 7 days. 14 tablet 0   predniSONE  (DELTASONE ) 10 MG tablet Take 1 tablet (10 mg total) by mouth daily with breakfast. 30 tablet 0   predniSONE  (DELTASONE ) 10 MG tablet 3 tab po day 1, 2 tab po day 2 and 1 tab po day 3 6 tablet 0   sodium chloride  HYPERTONIC 3 % nebulizer solution 3 ml via neb twice daily 750 mL 12   traZODone (DESYREL) 50 MG tablet Take 50 mg by mouth at bedtime.     triamcinolone  cream (KENALOG ) 0.1 % Apply topically 2 (two) times daily. 30 g 2   triamcinolone  cream (KENALOG ) 0.1 % APPLY TOPICALLY TWICE DAILY AS NEEDED 30 g 3   Vitamin D , Ergocalciferol , (DRISDOL ) 1.25 MG (50000 UNIT) CAPS capsule 1 capsule per month 3 capsule 0   Vitamin D , Ergocalciferol , (DRISDOL ) 1.25 MG (50000 UNIT) CAPS capsule TAKE ONE CAPSULE BY MOUTH ONCE WEEKLY FOR SIX WEEKS 6 capsule 0   No facility-administered medications prior to visit.   Review of Systems  Review of Systems  Constitutional: Negative.   Respiratory:  Positive for cough and shortness of breath.   Cardiovascular: Negative.    Physical Exam  There were no vitals taken for this visit. Physical Exam Constitutional:      Appearance: Normal appearance. She is well-developed.  HENT:     Head: Normocephalic and atraumatic.     Mouth/Throat:     Mouth: Mucous membranes are moist.     Pharynx: Oropharynx is clear.  Eyes:     Pupils: Pupils are equal, round, and reactive to light.  Cardiovascular:     Rate and  Rhythm: Normal rate and regular rhythm.     Heart sounds: Normal heart sounds. No murmur heard. Pulmonary:     Effort: Pulmonary effort is normal. No respiratory distress.     Breath sounds: Rales present. No wheezing or rhonchi.  Musculoskeletal:        General: Normal range of motion.     Cervical back: Normal range of motion and neck supple.  Skin:    General: Skin is warm and dry.     Findings: No erythema or rash.  Neurological:     General: No focal deficit present.     Mental Status: She is alert and oriented to person, place, and time. Mental status is at baseline.  Psychiatric:        Mood and Affect: Mood normal.        Behavior: Behavior normal.        Thought Content: Thought content normal.        Judgment: Judgment normal.     Lab Results:  CBC    Component Value Date/Time   WBC 5.7 12/10/2024 1503   RBC 4.24 12/10/2024 1503   HGB 13.8 12/10/2024 1503   HCT 41.3 12/10/2024 1503   PLT 302.0 12/10/2024 1503   MCV 97.4 12/10/2024 1503   MCH 31.4 02/03/2020 0945   MCHC 33.4 12/10/2024 1503   RDW 14.7 12/10/2024 1503   LYMPHSABS 2.2 12/10/2024 1503   MONOABS 0.5 12/10/2024 1503   EOSABS 0.1 12/10/2024 1503   BASOSABS 0.1 12/10/2024 1503    BMET    Component Value Date/Time  NA 141 12/10/2024 1503   K 3.7 12/10/2024 1503   CL 104 12/10/2024 1503   CO2 30 12/10/2024 1503   GLUCOSE 87 12/10/2024 1503   BUN 17 12/10/2024 1503   CREATININE 0.62 12/10/2024 1503   CREATININE 0.59 (L) 04/25/2024 1613   CALCIUM 8.8 12/10/2024 1503   GFRNONAA >60 02/03/2020 0945   GFRAA >60 02/03/2020 0945    BNP No results found for: BNP  ProBNP    Component Value Date/Time   PROBNP 124.0 (H) 01/02/2024 1419    Imaging: No results found.   Assessment & Plan:   1. Stage 3 severe COPD by GOLD classification (HCC) (Primary)  2. Chronic respiratory failure with hypoxia (HCC)  Assessment and Plan Assessment & Plan Stage 3 severe COPD with chronic respiratory  failure and hypoxia Stage 3 severe COPD with chronic respiratory failure and hypoxia, characterized by significant dyspnea on exertion and increased use of albuterol  inhaler. Lung function has declined over the past three years with FEV1 decreasing from 44% to 32% and diffusion capacity from 26 to 22. Current treatment includes Trelegy and Ohtuvayre , with recent initiation of pulmonary rehabilitation. Symptoms include congestion and cough, affecting sleep. Previous treatment with Z-Pak and prednisone  provided minimal relief. Discussed the use of low-dose prednisone  to manage symptoms, with risks including osteoporosis, cataracts, and drug-induced diabetes. Benefits include improved breathing and reduced COPD exacerbations. - Ordered 3L oxygen  continuously. Needs new script sent to [facility] for oxygen  concentrator and emergency tank due to insurance change  - Continue Trelegy 200 mcg, one puff once daily. - Continue Ohtuvayre  twice daily. - Prescribed low-dose prednisone  5 mg once daily until follow-up with Dr. Neda - Advised returning Jill Spence equipment if unable to use it. - Will investigate home-based pulmonary rehabilitation options. - Ensure follow-up with Dr. Neda on February 17th at 1:15 PM.   Vitals:   12/23/24 1309 12/23/24 1404 12/23/24 1410  BP: 118/74    Pulse: (!) 54 66 70  Temp: (!) 97.1 F (36.2 C)    Height: 5' 3 (1.6 m)    Weight: 164 lb 3.2 oz (74.5 kg)    SpO2: 91% (!) 83% Comment: RA, RESTING 97% Comment: 3L O2 cont, RESTING  BMI (Calculated): 29.09      Jill LELON Ferrari, NP 12/23/2024     [1]  Allergies Allergen Reactions   Influenza Vaccines    Aspirin     Other Reaction(s): GI Intolerance  [2]  Social History Tobacco Use  Smoking Status Former   Current packs/day: 0.00   Average packs/day: 0.3 packs/day for 6.0 years (1.5 ttl pk-yrs)   Types: Cigarettes   Start date: 10/13/2001   Quit date: 10/14/2007   Years since quitting: 17.2  Smokeless Tobacco  Never   "

## 2024-12-23 NOTE — Patient Instructions (Addendum)
" °  VISIT SUMMARY: During your visit, we discussed your respiratory conditions, including COPD, chronic respiratory failure, pulmonary fibrosis, and emphysema. We reviewed your need for requalification for oxygen  therapy due to a change in insurance and addressed your current symptoms and treatment plan.  YOUR PLAN: -STAGE 3 SEVERE COPD WITH CHRONIC RESPIRATORY FAILURE AND HYPOXIA: Chronic Obstructive Pulmonary Disease (COPD) is a progressive lung disease that makes it hard to breathe. Chronic respiratory failure and hypoxia mean your lungs are not getting enough oxygen  into your blood. We will continue your current medications, Trelegy and Otuvaer, and have prescribed low-dose prednisone  to help manage your symptoms. We have also ordered an oxygen  concentrator and emergency tank through [employer] and provided tubing for your Ohtuvayre . Please continue using your medications as directed and follow up with Dr. Neda on February 17th at 1:15 PM.  -PULMONARY FIBROSIS AND EMPHYSEMA: Pulmonary fibrosis is a condition where lung tissue becomes damaged and scarred, and emphysema is a type of COPD that involves damage to the air sacs in the lungs. Both conditions cause difficulty breathing and fatigue. We discussed alternative pulmonary rehabilitation options due to insurance limitations and your preference for home-based therapy. Please return the [employer] equipment if you are unable to use it, and we will continue to investigate home-based pulmonary rehabilitation options.  Orders: -Order to [facility] for 2L oxygen  continuous. Needs Oxygen  concentrator, emergency tank and C tanks.  She also needs nebulizer tubing.  -Letter stating that patient establish with [facility] for COPD stage 3, emphysema and chronic respiratory failure. Patient is considered disabled.  RX: -Prednisone  5mg  daily   Follow-up:  -February with Dr. Neda  "

## 2024-12-23 NOTE — Telephone Encounter (Signed)
 Of course. Both orders have been sent urgently to [facility]. NFN

## 2024-12-23 NOTE — Telephone Encounter (Signed)
 Signed, thank you Mary-Hannah

## 2024-12-23 NOTE — Telephone Encounter (Signed)
 [facility] PT and Tina (DPR) X1. LMTCB.   I spoke with [facility] ([facility], Daniel Mcalpine) and was informed that the reason the are wanting the pt's oxygen  order back, is because they are no longer in network with pt's insurance. When they recently called pt, it was a curtesy call to let her know and also stating she had 30 days with it before they would need it back.   An urgent order was placed for pt for her new o2 order to [facility] and a message was left for the PCC's. When pt receives her new o2 order, she needs to call [facility] and let them know they can come grab her previous order.

## 2024-12-25 ENCOUNTER — Telehealth: Payer: Self-pay

## 2024-12-25 ENCOUNTER — Telehealth: Payer: Self-pay | Admitting: Registered Nurse

## 2024-12-25 NOTE — Telephone Encounter (Signed)
 Pt called and stated she received the wrong concentrator. Pt states she is having to refill the tanks her self and did not receive any emergency tanks and is not able to refill them herself, she prefers if they bring her the new tanks. Pt states she is having to go to somebody else's house and is not able to bring any tanks. Pt is needing the right order before the store. I informed pt that I would call [facility] and see what is needed so they can give her the right equipment. Pt verbalized understanding.   I called [facility] and spoke to Ozell. Driver is out for today and will assist pt. Driver will help pt with the delivery and will provide and emergency tank if she does not already have one. I will call pt back and inform.   I called and spoke to pt. I informed pt of Michael's note and pt verbalized understanding. I had to re inform pt that she will not be without o2 this weekend and when the driver comes ut today, they will better assist her with everything. NFN

## 2024-12-25 NOTE — Telephone Encounter (Signed)
 Pt called and said they want to speak with you urgently

## 2024-12-25 NOTE — Telephone Encounter (Signed)
 See other encounter. NFN
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
## 2024-12-25 NOTE — Telephone Encounter (Signed)
 Return Jill Spence call, she voiced concerns about the weather. She was instructed to call office on Monday 12/29/2024, her scheduled appointment is on 12/31/2024, she verbalizes understanding. If the weather is bad her appointment will be changed to a telephone visit, she verbalizes understanding.

## 2024-12-31 ENCOUNTER — Encounter: Payer: Self-pay | Admitting: Registered Nurse

## 2024-12-31 ENCOUNTER — Encounter: Attending: Physical Medicine and Rehabilitation | Admitting: Registered Nurse

## 2024-12-31 DIAGNOSIS — M25561 Pain in right knee: Secondary | ICD-10-CM | POA: Diagnosis not present

## 2024-12-31 DIAGNOSIS — G894 Chronic pain syndrome: Secondary | ICD-10-CM | POA: Diagnosis not present

## 2024-12-31 DIAGNOSIS — Z5181 Encounter for therapeutic drug level monitoring: Secondary | ICD-10-CM | POA: Diagnosis not present

## 2024-12-31 DIAGNOSIS — M545 Low back pain, unspecified: Secondary | ICD-10-CM | POA: Insufficient documentation

## 2024-12-31 DIAGNOSIS — M2550 Pain in unspecified joint: Secondary | ICD-10-CM | POA: Insufficient documentation

## 2024-12-31 DIAGNOSIS — G8929 Other chronic pain: Secondary | ICD-10-CM | POA: Insufficient documentation

## 2024-12-31 DIAGNOSIS — M25562 Pain in left knee: Secondary | ICD-10-CM | POA: Insufficient documentation

## 2024-12-31 DIAGNOSIS — Z79891 Long term (current) use of opiate analgesic: Secondary | ICD-10-CM | POA: Diagnosis not present

## 2024-12-31 MED ORDER — OXYCODONE HCL 5 MG PO TABS: 5.0000 mg | ORAL_TABLET | Freq: Three times a day (TID) | ORAL | 0 refills | Status: AC | PRN

## 2024-12-31 MED ORDER — OXYCODONE HCL 5 MG PO TABS: 5.0000 mg | ORAL_TABLET | Freq: Three times a day (TID) | ORAL | 0 refills | Status: DC | PRN

## 2024-12-31 NOTE — Progress Notes (Signed)
 "  Subjective:    Patient ID: Jill Spence, female    DOB: 03/23/1951, 74 y.o.   MRN: [id number]  HPI: Jill Spence is a 74 y.o. female whose appointment was changed to telephone visit, she was unable to do a virtual visit due to her WIFI- connection.  I connected with  Jill Spence  by telephone and verified that I am speaking with the correct person using two identifiers.  Location: Patient: In her Home  Provider: In the office    I discussed the limitations, risks, security and privacy concerns of performing an evaluation and management service by telephone and the availability of in person appointments. I also discussed with the patient that there may be a patient responsible charge related to this service. The patient expressed understanding and agreed to proceed.  She states her pain is located in her lower back, bilateral knee pain and generalized joint pain. She rates her pain 6. Her current exercise regime is walking with her walker  and performing stretching exercises.  Jill Spence equivalent is 23.08 MME.   Last Oral Swab was Performed on 08/14/2024, it was consistent.      Pain Inventory Average Pain 7 Pain Right Now 6 My pain is aching and throbbing  In the last 24 hours, has pain interfered with the following? General activity 0 Relation with others 3 Enjoyment of life 7 What TIME of day is your pain at its worst? night Sleep (in general) Fair  Pain is worse with: cold weather Pain improves with: medication Relief from Meds: 7  Family History  Problem Relation Age of Onset   Cancer Father    Emphysema Sister    Social History   Socioeconomic History   Marital status: Widowed    Spouse name: Not on file   Number of children: Not on file   Years of education: Not on file   Highest education level: Some college, no degree  Occupational History   Not on file  Tobacco Use   Smoking status: Former    Current packs/day: 0.00    Average  packs/day: 0.3 packs/day for 6.0 years (1.5 ttl pk-yrs)    Types: Cigarettes    Start date: 10/13/2001    Quit date: 10/14/2007    Years since quitting: 17.2   Smokeless tobacco: Never  Vaping Use   Vaping status: Never Used  Substance and Sexual Activity   Alcohol use: No   Drug use: No   Sexual activity: Not on file  Other Topics Concern   Not on file  Social History Narrative   Not on file   Social Drivers of Health   Tobacco Use: Medium Risk (12/23/2024)   Patient History    Smoking Tobacco Use: Former    Smokeless Tobacco Use: Never    Passive Exposure: Not on Actuary Strain: Low Risk (06/19/2024)   Overall Financial Resource Strain (CARDIA)    Difficulty of Paying Living Expenses: Not hard at all  Food Insecurity: No Food Insecurity (06/19/2024)   Epic    Worried About Programme Researcher, Broadcasting/film/video in the Last Year: Never true    Ran Out of Food in the Last Year: Never true  Transportation Needs: No Transportation Needs (06/19/2024)   Epic    Lack of Transportation (Medical): No    Lack of Transportation (Non-Medical): No  Physical Activity: Inactive (06/19/2024)   Exercise Vital Sign    Days of Exercise per Week: 0  days    Minutes of Exercise per Session: 0 min  Stress: Stress Concern Present (06/19/2024)   [employer] of Occupational Health - Occupational Stress Questionnaire    Feeling of Stress: Rather much  Social Connections: Socially Isolated (06/19/2024)   Social Connection and Isolation Panel    Frequency of Communication with Friends and Family: More than three times a week    Frequency of Social Gatherings with Friends and Family: Once a week    Attends Religious Services: Never    Database Administrator or Organizations: No    Attends Banker Meetings: Never    Marital Status: Widowed  Depression (PHQ2-9): Medium Risk (06/19/2024)   Depression (PHQ2-9)    PHQ-2 Score: 8  Alcohol Screen: Low Risk (06/19/2024)   Alcohol Screen     Last Alcohol Screening Score (AUDIT): 0  Housing: Unknown (06/19/2024)   Epic    Unable to Pay for Housing in the Last Year: No    Number of Times Moved in the Last Year: Not on file    Homeless in the Last Year: No  Utilities: Not At Risk (06/19/2024)   Epic    Threatened with loss of utilities: No  Health Literacy: Adequate Health Literacy (06/19/2024)   B1300 Health Literacy    Frequency of need for help with medical instructions: Never   Past Surgical History:  Procedure Laterality Date   BREAST SURGERY     COLONOSCOPY     EYE SURGERY Bilateral    cataract surgery with lens implants   MANDIBLE RECONSTRUCTION     MINOR REMOVAL OF MANDIBULAR HARDWARE Right 10/15/2015   Procedure: MINOR REMOVAL OF Right MANDIBULAR HARDWARE;  Surgeon: Glendia Primrose, DDS;  Location: [facility] OR;  Service: Oral Surgery;  Laterality: Right;   TONSILLECTOMY     Past Surgical History:  Procedure Laterality Date   BREAST SURGERY     COLONOSCOPY     EYE SURGERY Bilateral    cataract surgery with lens implants   MANDIBLE RECONSTRUCTION     MINOR REMOVAL OF MANDIBULAR HARDWARE Right 10/15/2015   Procedure: MINOR REMOVAL OF Right MANDIBULAR HARDWARE;  Surgeon: Glendia Primrose, DDS;  Location: [facility] OR;  Service: Oral Surgery;  Laterality: Right;   TONSILLECTOMY     Past Medical History:  Diagnosis Date   Anemia    low iron   Anxiety    Arthritis    Bipolar disorder (HCC)    Cancer (HCC) 1973   COPD (chronic obstructive pulmonary disease) (HCC)    Family history of adverse reaction to anesthesia    mom was put to sleep and she never woke up -    GERD (gastroesophageal reflux disease)    Guillain Barr syndrome    numbness in toes, legs hurt   Headache    migraines as a teenager   IBS (irritable bowel syndrome)    Lump, breast    removed years ago per Pt.   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  Opioid Risk Score:   Fall Risk Score:  `1  Depression screen PHQ 2/9     06/19/2024    4:12 PM 06/10/2024    1:38 PM 04/14/2024    1:32 PM 02/11/2024    1:33 PM 05/02/2023  3:50 PM 04/19/2023    1:43 PM 02/20/2023    1:54 PM  Depression screen PHQ 2/9  Decreased Interest 1 1 0 0 0 0 0  Down, Depressed, Hopeless 1 1 0 0 0 0 0  PHQ - 2 Score 2 2 0 0 0 0 0  Altered sleeping 0        Tired, decreased energy 3        Change in appetite 3        Feeling bad or failure about yourself  0        Trouble concentrating 0        Moving slowly or fidgety/restless 0        Suicidal thoughts 0        PHQ-9 Score 8         Difficult doing work/chores Somewhat difficult           Data saved with a previous flowsheet row definition    Review of Systems     Objective:   Physical Exam Vitals and nursing note reviewed.  Musculoskeletal:     Comments: No Physical Exam: Phone Visit           Assessment & Plan:  Guillain Barre Syndrome: Continue current medication regimen.  Continue to Monitor. 12/31/2024 Cervicalgia: No complaints today. Continue HEP as Tolerated. Continue to monitor.12/31/2024 Polyarthralgia: Continue current medication regimen . Continue to Monitor. 12/31/2024 Chronic Bilateral Low Back Pain: Continue HEP as Tolerated. Continue to Monitor. 12/31/2024 Chronic Pain of Bilateral Knee Pain R>L: Continue Outpatient Therapy. Continue current medication regimen. Continue to Monitor. 12/31/2024 Chronic Pain Syndrome:  Continue Oxycodone  5 mg three times a day as needed for pain. #90.  Second script sent for the following month. We will continue the opioid monitoring program, this consists of regular clinic visits, examinations, urine drug screen, pill counts as well as use of [place]  Controlled Substance Reporting system. A 12 month History has been reviewed on the [place]  Controlled Substance Reporting System on  12/31/2024   F/U in 2 months  Telephone Visit Established Patient Location of Patient in Her Home Location of Provider in the office   "

## 2025-01-01 ENCOUNTER — Other Ambulatory Visit: Payer: Self-pay | Admitting: Medical

## 2025-01-16 ENCOUNTER — Telehealth: Admitting: Licensed Clinical Social Worker

## 2025-01-20 ENCOUNTER — Ambulatory Visit: Admitting: Pulmonary Disease

## 2025-02-03 ENCOUNTER — Other Ambulatory Visit

## 2025-02-24 ENCOUNTER — Encounter: Admitting: Registered Nurse

## 2025-03-10 ENCOUNTER — Ambulatory Visit: Admitting: Medical

## 2025-06-30 ENCOUNTER — Ambulatory Visit
